# Patient Record
Sex: Female | Born: 1965 | Race: Black or African American | Hispanic: No | Marital: Single | Smoking: Never smoker
Health system: Southern US, Community
[De-identification: ages and names within clinical notes are randomized; demographics above are authoritative.]

## PROBLEM LIST (undated history)

## (undated) DIAGNOSIS — I10 Essential (primary) hypertension: Secondary | ICD-10-CM

## (undated) DIAGNOSIS — E785 Hyperlipidemia, unspecified: Secondary | ICD-10-CM

## (undated) DIAGNOSIS — R072 Precordial pain: Secondary | ICD-10-CM

## (undated) DIAGNOSIS — I2510 Atherosclerotic heart disease of native coronary artery without angina pectoris: Secondary | ICD-10-CM

## (undated) DIAGNOSIS — G4730 Sleep apnea, unspecified: Secondary | ICD-10-CM

## (undated) DIAGNOSIS — K219 Gastro-esophageal reflux disease without esophagitis: Secondary | ICD-10-CM

## (undated) DIAGNOSIS — I639 Cerebral infarction, unspecified: Secondary | ICD-10-CM

## (undated) HISTORY — DX: Cerebral infarction, unspecified: I63.9

## (undated) HISTORY — PX: OTHER SURGICAL HISTORY: SHX169

## (undated) HISTORY — DX: Gastro-esophageal reflux disease without esophagitis: K21.9

## (undated) HISTORY — DX: Sleep apnea, unspecified: G47.30

## (undated) HISTORY — DX: Precordial pain: R07.2

## (undated) HISTORY — PX: CARDIAC CATHETERIZATION: SHX172

---

## 1998-06-21 ENCOUNTER — Emergency Department (HOSPITAL_COMMUNITY): Admission: EM | Admit: 1998-06-21 | Discharge: 1998-06-21 | Payer: Self-pay | Admitting: Emergency Medicine

## 1998-06-21 ENCOUNTER — Ambulatory Visit (HOSPITAL_COMMUNITY): Admission: RE | Admit: 1998-06-21 | Discharge: 1998-06-21 | Payer: Self-pay

## 1999-09-18 ENCOUNTER — Ambulatory Visit (HOSPITAL_COMMUNITY): Admission: RE | Admit: 1999-09-18 | Discharge: 1999-09-18 | Payer: Self-pay | Admitting: *Deleted

## 1999-09-18 ENCOUNTER — Encounter: Payer: Self-pay | Admitting: *Deleted

## 2000-03-08 ENCOUNTER — Ambulatory Visit (HOSPITAL_COMMUNITY): Admission: RE | Admit: 2000-03-08 | Discharge: 2000-03-08 | Payer: Self-pay

## 2000-03-15 ENCOUNTER — Encounter: Admission: RE | Admit: 2000-03-15 | Discharge: 2000-03-15 | Payer: Self-pay | Admitting: Family Medicine

## 2000-03-15 ENCOUNTER — Encounter: Payer: Self-pay | Admitting: Family Medicine

## 2000-06-16 ENCOUNTER — Emergency Department (HOSPITAL_COMMUNITY): Admission: EM | Admit: 2000-06-16 | Discharge: 2000-06-16 | Payer: Self-pay | Admitting: Emergency Medicine

## 2000-09-15 ENCOUNTER — Encounter: Payer: Self-pay | Admitting: Family Medicine

## 2000-09-15 ENCOUNTER — Encounter: Admission: RE | Admit: 2000-09-15 | Discharge: 2000-09-15 | Payer: Self-pay | Admitting: Family Medicine

## 2001-08-26 ENCOUNTER — Emergency Department (HOSPITAL_COMMUNITY): Admission: EM | Admit: 2001-08-26 | Discharge: 2001-08-26 | Payer: Self-pay | Admitting: Emergency Medicine

## 2001-09-06 ENCOUNTER — Encounter: Payer: Self-pay | Admitting: Emergency Medicine

## 2001-09-06 ENCOUNTER — Emergency Department (HOSPITAL_COMMUNITY): Admission: EM | Admit: 2001-09-06 | Discharge: 2001-09-07 | Payer: Self-pay | Admitting: Emergency Medicine

## 2004-10-27 ENCOUNTER — Emergency Department (HOSPITAL_COMMUNITY): Admission: EM | Admit: 2004-10-27 | Discharge: 2004-10-27 | Payer: Self-pay | Admitting: Emergency Medicine

## 2006-01-18 ENCOUNTER — Emergency Department (HOSPITAL_COMMUNITY): Admission: EM | Admit: 2006-01-18 | Discharge: 2006-01-18 | Payer: Self-pay | Admitting: Family Medicine

## 2006-06-20 ENCOUNTER — Inpatient Hospital Stay (HOSPITAL_COMMUNITY): Admission: EM | Admit: 2006-06-20 | Discharge: 2006-06-21 | Payer: Self-pay | Admitting: Emergency Medicine

## 2006-06-20 ENCOUNTER — Ambulatory Visit: Payer: Self-pay | Admitting: Family Medicine

## 2006-06-20 ENCOUNTER — Encounter (INDEPENDENT_AMBULATORY_CARE_PROVIDER_SITE_OTHER): Payer: Self-pay | Admitting: Interventional Cardiology

## 2006-07-14 ENCOUNTER — Emergency Department (HOSPITAL_COMMUNITY): Admission: EM | Admit: 2006-07-14 | Discharge: 2006-07-14 | Payer: Self-pay | Admitting: Emergency Medicine

## 2006-07-15 ENCOUNTER — Ambulatory Visit: Payer: Self-pay | Admitting: Family Medicine

## 2006-07-22 ENCOUNTER — Ambulatory Visit (HOSPITAL_COMMUNITY): Admission: RE | Admit: 2006-07-22 | Discharge: 2006-07-22 | Payer: Self-pay | Admitting: Family Medicine

## 2006-07-26 ENCOUNTER — Ambulatory Visit: Payer: Self-pay | Admitting: Family Medicine

## 2006-07-29 ENCOUNTER — Ambulatory Visit: Payer: Self-pay | Admitting: Family Medicine

## 2006-08-12 ENCOUNTER — Ambulatory Visit: Payer: Self-pay | Admitting: Internal Medicine

## 2006-08-16 ENCOUNTER — Ambulatory Visit: Payer: Self-pay | Admitting: Internal Medicine

## 2006-08-17 ENCOUNTER — Ambulatory Visit (HOSPITAL_COMMUNITY): Admission: RE | Admit: 2006-08-17 | Discharge: 2006-08-17 | Payer: Self-pay | Admitting: Internal Medicine

## 2006-08-18 ENCOUNTER — Ambulatory Visit: Payer: Self-pay | Admitting: Family Medicine

## 2006-09-06 ENCOUNTER — Ambulatory Visit: Payer: Self-pay | Admitting: Family Medicine

## 2006-09-29 ENCOUNTER — Ambulatory Visit: Payer: Self-pay | Admitting: Family Medicine

## 2007-01-05 ENCOUNTER — Ambulatory Visit: Payer: Self-pay | Admitting: Family Medicine

## 2007-01-06 ENCOUNTER — Ambulatory Visit: Payer: Self-pay | Admitting: *Deleted

## 2007-02-13 ENCOUNTER — Ambulatory Visit: Payer: Self-pay | Admitting: Family Medicine

## 2007-03-06 ENCOUNTER — Ambulatory Visit: Payer: Self-pay | Admitting: Family Medicine

## 2007-03-14 ENCOUNTER — Encounter (INDEPENDENT_AMBULATORY_CARE_PROVIDER_SITE_OTHER): Payer: Self-pay | Admitting: Cardiovascular Disease

## 2007-03-14 ENCOUNTER — Ambulatory Visit (HOSPITAL_COMMUNITY): Admission: RE | Admit: 2007-03-14 | Discharge: 2007-03-14 | Payer: Self-pay | Admitting: *Deleted

## 2007-04-10 ENCOUNTER — Ambulatory Visit: Payer: Self-pay | Admitting: Family Medicine

## 2007-06-27 ENCOUNTER — Ambulatory Visit: Payer: Self-pay | Admitting: Family Medicine

## 2007-06-27 ENCOUNTER — Encounter (INDEPENDENT_AMBULATORY_CARE_PROVIDER_SITE_OTHER): Payer: Self-pay | Admitting: Family Medicine

## 2007-06-27 LAB — CONVERTED CEMR LAB
AST: 12 units/L (ref 0–37)
Albumin: 3.8 g/dL (ref 3.5–5.2)
Alkaline Phosphatase: 59 units/L (ref 39–117)
CO2: 25 meq/L (ref 19–32)
Calcium: 9.1 mg/dL (ref 8.4–10.5)
Chlamydia, DNA Probe: NEGATIVE
Chloride: 101 meq/L (ref 96–112)
Cholesterol: 203 mg/dL — ABNORMAL HIGH (ref 0–200)
GC Probe Amp, Genital: NEGATIVE
Glucose, Bld: 177 mg/dL — ABNORMAL HIGH (ref 70–99)
HDL: 37 mg/dL — ABNORMAL LOW (ref >39)
Potassium: 4.2 meq/L (ref 3.5–5.3)
Total CHOL/HDL Ratio: 5.5
Total Protein: 7.6 g/dL (ref 6.0–8.3)

## 2007-08-16 ENCOUNTER — Encounter (INDEPENDENT_AMBULATORY_CARE_PROVIDER_SITE_OTHER): Payer: Self-pay | Admitting: *Deleted

## 2008-05-08 ENCOUNTER — Ambulatory Visit: Payer: Self-pay | Admitting: Family Medicine

## 2008-05-08 DIAGNOSIS — I10 Essential (primary) hypertension: Secondary | ICD-10-CM

## 2008-05-08 DIAGNOSIS — E785 Hyperlipidemia, unspecified: Secondary | ICD-10-CM

## 2008-05-08 DIAGNOSIS — E118 Type 2 diabetes mellitus with unspecified complications: Secondary | ICD-10-CM | POA: Insufficient documentation

## 2008-05-08 DIAGNOSIS — Z794 Long term (current) use of insulin: Secondary | ICD-10-CM

## 2008-05-08 LAB — CONVERTED CEMR LAB: Hgb A1c MFr Bld: 12.1 %

## 2008-05-15 ENCOUNTER — Encounter (INDEPENDENT_AMBULATORY_CARE_PROVIDER_SITE_OTHER): Payer: Self-pay | Admitting: Family Medicine

## 2008-05-15 ENCOUNTER — Ambulatory Visit (HOSPITAL_COMMUNITY): Admission: RE | Admit: 2008-05-15 | Discharge: 2008-05-15 | Payer: Self-pay | Admitting: Family Medicine

## 2008-05-15 LAB — CONVERTED CEMR LAB
AST: 11 units/L (ref 0–37)
Albumin: 3.9 g/dL (ref 3.5–5.2)
BUN: 10 mg/dL (ref 6–23)
Basophils Relative: 0 % (ref 0–1)
Calcium: 9.5 mg/dL (ref 8.4–10.5)
Chloride: 96 meq/L (ref 96–112)
Creatinine, Ser: 0.69 mg/dL (ref 0.40–1.20)
Eosinophils Absolute: 0.1 10*3/uL (ref 0.0–0.7)
Eosinophils Relative: 1 % (ref 0–5)
Lymphs Abs: 3.7 10*3/uL (ref 0.7–4.0)
MCV: 96.7 fL (ref 78.0–100.0)
Microalb, Ur: 5.64 mg/dL — ABNORMAL HIGH (ref 0.00–1.89)
Neutro Abs: 6.3 10*3/uL (ref 1.7–7.7)
Potassium: 4.4 meq/L (ref 3.5–5.3)
RBC: 4.49 M/uL (ref 3.87–5.11)
Sodium: 135 meq/L (ref 135–145)
Total Protein: 7.6 g/dL (ref 6.0–8.3)

## 2008-05-20 ENCOUNTER — Ambulatory Visit: Payer: Self-pay | Admitting: Family Medicine

## 2008-05-21 ENCOUNTER — Encounter (INDEPENDENT_AMBULATORY_CARE_PROVIDER_SITE_OTHER): Payer: Self-pay | Admitting: Family Medicine

## 2008-05-29 LAB — CONVERTED CEMR LAB
Collection Interval-CRCL: 24 hr
Creatinine Clearance: 200 mL/min — ABNORMAL HIGH (ref 75–115)
Creatinine, Urine: 92 mg/dL

## 2008-07-08 ENCOUNTER — Ambulatory Visit: Payer: Self-pay | Admitting: Family Medicine

## 2008-07-08 ENCOUNTER — Encounter (INDEPENDENT_AMBULATORY_CARE_PROVIDER_SITE_OTHER): Payer: Self-pay | Admitting: Family Medicine

## 2008-07-08 LAB — CONVERTED CEMR LAB
Blood Glucose, Fingerstick: 169
HDL: 38 mg/dL — ABNORMAL LOW (ref >39)
LDL Cholesterol: 107 mg/dL — ABNORMAL HIGH (ref 0–99)
Total CHOL/HDL Ratio: 4.6
Triglycerides: 148 mg/dL (ref <150)
VLDL: 30 mg/dL (ref 0–40)

## 2008-08-01 ENCOUNTER — Encounter (INDEPENDENT_AMBULATORY_CARE_PROVIDER_SITE_OTHER): Payer: Self-pay | Admitting: Family Medicine

## 2008-10-01 ENCOUNTER — Ambulatory Visit: Payer: Self-pay | Admitting: Family Medicine

## 2008-12-10 ENCOUNTER — Ambulatory Visit: Payer: Self-pay | Admitting: Family Medicine

## 2008-12-10 LAB — CONVERTED CEMR LAB: Hgb A1c MFr Bld: 9.2 %

## 2008-12-24 ENCOUNTER — Ambulatory Visit: Payer: Self-pay | Admitting: Family Medicine

## 2009-01-12 ENCOUNTER — Encounter (INDEPENDENT_AMBULATORY_CARE_PROVIDER_SITE_OTHER): Payer: Self-pay | Admitting: Family Medicine

## 2009-03-04 ENCOUNTER — Ambulatory Visit: Payer: Self-pay | Admitting: Family Medicine

## 2009-03-04 LAB — CONVERTED CEMR LAB
Blood Glucose, Fingerstick: 299
Hgb A1c MFr Bld: 9.5 %

## 2009-03-06 ENCOUNTER — Ambulatory Visit: Payer: Self-pay | Admitting: Family Medicine

## 2009-03-12 ENCOUNTER — Telehealth (INDEPENDENT_AMBULATORY_CARE_PROVIDER_SITE_OTHER): Payer: Self-pay | Admitting: *Deleted

## 2009-04-08 ENCOUNTER — Ambulatory Visit: Payer: Self-pay | Admitting: Family Medicine

## 2009-04-08 LAB — CONVERTED CEMR LAB
AST: 13 units/L (ref 0–37)
BUN: 18 mg/dL (ref 6–23)
Cholesterol: 157 mg/dL (ref 0–200)
LDL Cholesterol: 101 mg/dL — ABNORMAL HIGH (ref 0–99)
Sodium: 136 meq/L (ref 135–145)
Triglycerides: 90 mg/dL (ref <150)
VLDL: 18 mg/dL (ref 0–40)

## 2009-04-26 ENCOUNTER — Encounter (INDEPENDENT_AMBULATORY_CARE_PROVIDER_SITE_OTHER): Payer: Self-pay | Admitting: Family Medicine

## 2009-04-28 ENCOUNTER — Encounter (INDEPENDENT_AMBULATORY_CARE_PROVIDER_SITE_OTHER): Payer: Self-pay | Admitting: Family Medicine

## 2009-10-22 ENCOUNTER — Ambulatory Visit: Payer: Self-pay | Admitting: Nurse Practitioner

## 2009-10-22 LAB — CONVERTED CEMR LAB: LDL Goal: 100 mg/dL

## 2010-06-29 ENCOUNTER — Emergency Department (HOSPITAL_COMMUNITY): Admission: EM | Admit: 2010-06-29 | Discharge: 2010-06-29 | Payer: Self-pay | Admitting: Emergency Medicine

## 2010-07-01 ENCOUNTER — Ambulatory Visit: Payer: Self-pay | Admitting: Nurse Practitioner

## 2010-07-01 DIAGNOSIS — E669 Obesity, unspecified: Secondary | ICD-10-CM

## 2010-07-01 LAB — CONVERTED CEMR LAB
Albumin: 3.7 g/dL (ref 3.5–5.2)
Alkaline Phosphatase: 76 units/L (ref 39–117)
BUN: 10 mg/dL (ref 6–23)
Basophils Absolute: 0 10*3/uL (ref 0.0–0.1)
Bilirubin Urine: NEGATIVE
Blood Glucose, Fingerstick: 269
CO2: 23 meq/L (ref 19–32)
Calcium: 9 mg/dL (ref 8.4–10.5)
Chloride: 101 meq/L (ref 96–112)
Creatinine, Ser: 0.62 mg/dL (ref 0.40–1.20)
Eosinophils Relative: 1 % (ref 0–5)
Glucose, Bld: 282 mg/dL — ABNORMAL HIGH (ref 70–99)
Glucose, Urine, Semiquant: >=1000
HDL: 40 mg/dL (ref >39)
Hemoglobin: 13.1 g/dL (ref 12.0–15.0)
Hgb A1c MFr Bld: 12.3 %
Ketones, urine, test strip: NEGATIVE
Lymphocytes Relative: 42 % (ref 12–46)
MCV: 95.4 fL (ref 78.0–100.0)
Monocytes Relative: 7 % (ref 3–12)
Neutro Abs: 3.5 10*3/uL (ref 1.7–7.7)
Nitrite: NEGATIVE
RBC: 4.31 M/uL (ref 3.87–5.11)
RDW: 13.8 % (ref 11.5–15.5)
Rapid HIV Screen: NEGATIVE
Sodium: 138 meq/L (ref 135–145)
Specific Gravity, Urine: >=1.03
TSH: 1.734 microintl units/mL (ref 0.350–4.500)
Total Bilirubin: 0.3 mg/dL (ref 0.3–1.2)
Total Protein: 7.2 g/dL (ref 6.0–8.3)
Triglycerides: 115 mg/dL (ref <150)
WBC Urine, dipstick: NEGATIVE
WBC: 7 10*3/uL (ref 4.0–10.5)
pH: 5.5

## 2010-07-02 ENCOUNTER — Encounter (INDEPENDENT_AMBULATORY_CARE_PROVIDER_SITE_OTHER): Payer: Self-pay | Admitting: Nurse Practitioner

## 2010-07-06 ENCOUNTER — Telehealth (INDEPENDENT_AMBULATORY_CARE_PROVIDER_SITE_OTHER): Payer: Self-pay | Admitting: Nurse Practitioner

## 2010-07-31 ENCOUNTER — Ambulatory Visit: Payer: Self-pay | Admitting: Nurse Practitioner

## 2010-07-31 LAB — CONVERTED CEMR LAB
Bilirubin Urine: NEGATIVE
Blood Glucose, Fingerstick: 291
Blood in Urine, dipstick: NEGATIVE
Glucose, Urine, Semiquant: >=1000
Ketones, urine, test strip: NEGATIVE
Nitrite: NEGATIVE
Protein, U semiquant: NEGATIVE
Urobilinogen, UA: 0.2
WBC Urine, dipstick: NEGATIVE

## 2010-09-11 ENCOUNTER — Ambulatory Visit: Payer: Self-pay | Admitting: Nurse Practitioner

## 2010-09-11 LAB — CONVERTED CEMR LAB
Bilirubin Urine: NEGATIVE
Blood Glucose, Fingerstick: 391
Blood in Urine, dipstick: NEGATIVE
Chlamydia, DNA Probe: NEGATIVE
Glucose, Urine, Semiquant: >=1000
Ketones, urine, test strip: NEGATIVE
Urobilinogen, UA: 0.2
WBC Urine, dipstick: NEGATIVE

## 2010-09-14 ENCOUNTER — Encounter (INDEPENDENT_AMBULATORY_CARE_PROVIDER_SITE_OTHER): Payer: Self-pay | Admitting: Nurse Practitioner

## 2010-09-21 ENCOUNTER — Telehealth (INDEPENDENT_AMBULATORY_CARE_PROVIDER_SITE_OTHER): Payer: Self-pay | Admitting: Nurse Practitioner

## 2010-09-23 ENCOUNTER — Ambulatory Visit (HOSPITAL_COMMUNITY): Admission: RE | Admit: 2010-09-23 | Discharge: 2010-09-23 | Payer: Self-pay | Admitting: Internal Medicine

## 2010-11-11 ENCOUNTER — Ambulatory Visit: Payer: Self-pay | Admitting: Nurse Practitioner

## 2010-11-23 ENCOUNTER — Emergency Department (HOSPITAL_COMMUNITY)
Admission: EM | Admit: 2010-11-23 | Discharge: 2010-11-24 | Payer: Self-pay | Source: Home / Self Care | Attending: Internal Medicine | Admitting: Internal Medicine

## 2010-12-11 ENCOUNTER — Ambulatory Visit
Admission: RE | Admit: 2010-12-11 | Discharge: 2010-12-11 | Payer: Self-pay | Source: Home / Self Care | Attending: Nurse Practitioner | Admitting: Nurse Practitioner

## 2010-12-11 ENCOUNTER — Encounter (INDEPENDENT_AMBULATORY_CARE_PROVIDER_SITE_OTHER): Payer: Self-pay | Admitting: Nurse Practitioner

## 2010-12-11 LAB — CONVERTED CEMR LAB
Bilirubin Urine: NEGATIVE
Blood Glucose, Fingerstick: 285
Protein, U semiquant: NEGATIVE
Specific Gravity, Urine: >=1.03
Urobilinogen, UA: 0.2
WBC Urine, dipstick: NEGATIVE
pH: 5

## 2010-12-31 NOTE — Assessment & Plan Note (Signed)
Summary: Diabetes/HTN   Vital Signs:  Patient profile:   45 year old female Menstrual status:  regular LMP:     05/2010 Weight:      262.2 pounds BMI:     46.25 Temp:     97.7 degrees F oral Pulse rate:   70 / minute Pulse rhythm:   regular Resp:     16 per minute BP sitting:   160 / 97  (left arm) Cuff size:   large  Vitals Entered By: Levon Hedger (July 01, 2010 9:21 AM)  Nutrition Counseling: Patient's BMI is greater than 25 and therefore counseled on weight management options. CC: went to hospital day before yesterday not feeling well and vision blurriness and was told to come see pcp for her diabetes, Hypertension Management, Lipid Management Is Patient Diabetic? Yes Pain Assessment Patient in pain? no      CBG Result 269 CBG Device ID A  Does patient need assistance? Functional Status Self care Ambulation Normal Comments pt is out of all medication and has been out since November 2010. LMP (date): 05/2010 LMP - Character: normal Menarche (age onset years): 11    Menstrual flow (days): 4 Enter LMP: 05/2010 Last PAP Result normal   CC:  went to hospital day before yesterday not feeling well and vision blurriness and was told to come see pcp for her diabetes, Hypertension Management, and Lipid Management.  History of Present Illness:  Pt the office for f/u after several months of not having her medications. ER vist on 06/29/2010 for blurred vision and "chest pain" Blood sugar was elevated and pt was restarted on metformin 500mg  by mouth two times a day.  Pt has NOT gotten the prescription yet, states she has intentions on picking it up today. workup negative and pt was advised by the ER go F/u with PCP She was doing well on her previous regimen of medications her eligibility expired and she was not able to get the medications.  Diabetes Management History:      The patient is a 45 years old female who comes in for evaluation of Type 2 Diabetes Mellitus.  She  has not been enrolled in the "Diabetic Education Program".  She states understanding of dietary principles but she is not following the appropriate diet.  No sensory loss is reported.  Self foot exams are not being performed.  She is not checking home blood sugars.  She says that she is not exercising regularly.        Hyperglycemic symptoms include polyuria, polydipsia, and blurred vision.  Other comments include: Pt has not been taking meds.        The following changes have been made to her treatment plan since last visit: medication changes.  Treatment plan changes were initiated by patient.    Hypertension History:      She complains of chest pain, but denies headache and palpitations.  pt has not taken any medications since 09/2009.        Positive major cardiovascular risk factors include diabetes, hyperlipidemia, hypertension, and family history for ischemic heart disease (males less than 52 years old).  Negative major cardiovascular risk factors include female age less than 36 years old and non-tobacco-user status.        Further assessment for target organ damage reveals no history of ASHD, stroke/TIA, or peripheral vascular disease.    Lipid Management History:      Positive NCEP/ATP III risk factors include diabetes, HDL cholesterol less than  40, family history for ischemic heart disease (males less than 75 years old), and hypertension.  Negative NCEP/ATP III risk factors include female age less than 76 years old, no history of early menopause without estrogen hormone replacement, non-tobacco-user status, no ASHD (atherosclerotic heart disease), no prior stroke/TIA, no peripheral vascular disease, and no history of aortic aneurysm.        The patient states that she knows about the "Therapeutic Lifestyle Change" diet.  Her compliance with the TLC diet is poor.  The patient expresses understanding of adjunctive measures for cholesterol lowering.  Comments include: pt has not taken her  medications since 09/2009.       Habits & Providers  Exercise-Depression-Behavior     Does Patient Exercise: no  Allergies (verified): No Known Drug Allergies  Social History: Does Patient Exercise:  no  Review of Systems General:  Denies fever. Eyes:  Complains of blurring. CV:  Complains of swelling of feet; denies chest pain or discomfort and fatigue. Resp:  Denies cough. GI:  Denies abdominal pain, nausea, and vomiting. Neuro:  Complains of tingling; in feet.  Physical Exam  General:  alert.  obese Head:  normocephalic.   Lungs:  normal breath sounds.   Heart:  normal rate and regular rhythm.   Msk:  up to the exam table Neurologic:  alert & oriented X3.   Skin:  color normal.   Psych:  Oriented X3.    Diabetes Management Exam:    Foot Exam (with socks and/or shoes not present):       Sensory-Monofilament:          Left foot: normal          Right foot: normal   Impression & Recommendations:  Problem # 1:  DIABETES MELLITUS, TYPE II (ICD-250.00) pt has been without her meds since 09/2009 reviewed hgba1c with pt today will restart on all meds. actos samples given pt to check blood sugar at least twice per day - refill given on strips Her updated medication list for this problem includes:    Actos 30 Mg Tabs (Pioglitazone hcl) .Marland Kitchen... Take one tablet by mouth every morning    Januvia 100 Mg Tabs (Sitagliptin phosphate) .Marland Kitchen... Take one tablet by mouth every morning    Levemir Flexpen 100 Unit/ml Soln (Insulin detemir) ..... Inject 75  units under skin each day   gene icp levemir flexpen 07/08    Lisinopril-hydrochlorothiazide 20-12.5 Mg Tabs (Lisinopril-hydrochlorothiazide) .Marland Kitchen... Take 2 tablets by mouth  every morning    Novolog Flexpen 100 Unit/ml Soln (Insulin aspart) .Marland Kitchen... Check bs 15 minutes before each meal if 0-70 then eat first,71-120 then 10 units,121-150 then 15 units,151-200 then 18 units,201-250 then 20 units,251 or higher then 25.  Orders: Capillary  Blood Glucose/CBG (87564) UA Dipstick w/o Micro (manual) (33295) T-Urine Microalbumin w/creat. ratio [phone]) T-TSH [phone]) Hemoglobin A1C (22025)  Problem # 2:  HYPERTENSION (ICD-401.9) BP elevated today but pt has been without meds will refill meds reviewd DASH diet Her updated medication list for this problem includes:    Lisinopril-hydrochlorothiazide 20-12.5 Mg Tabs (Lisinopril-hydrochlorothiazide) .Marland Kitchen... Take 2 tablets by mouth  every morning  Orders: T-TSH (42706-23762)  Problem # 3:  HYPERLIPIDEMIA (ICD-272.4) will check labs today pt has been without meds so will likely be elevated Her updated medication list for this problem includes:    Crestor 5 Mg Tabs (Rosuvastatin calcium) .Marland Kitchen... Take one (1) tablet at bedtime  Orders: T-Lipid Profile (83151-76160) T-Comprehensive Metabolic Panel [phone]) T-CBC w/Diff (85462-70350) Rapid HIV  (  [address])  Problem # 4:  OBESITY (ICD-278.00) some weight decrease since last visit but that may be due to uncontrolled diabetes advised pt that she does need to increase activity  Complete Medication List: 1)  Actos 30 Mg Tabs (Pioglitazone hcl) .... Take one tablet by mouth every morning 2)  Crestor 5 Mg Tabs (Rosuvastatin calcium) .... Take one (1) tablet at bedtime 3)  Januvia 100 Mg Tabs (Sitagliptin phosphate) .... Take one tablet by mouth every morning 4)  Levemir Flexpen 100 Unit/ml Soln (Insulin detemir) .... Inject 75  units under skin each day   gene icp levemir flexpen 07/08 5)  Protonix 40 Mg Tbec (Pantoprazole sodium) .... Take one (1) tablet by mouth every day 6)  Glucometer Strips  .... 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 Take 2 tablets by mouth  every morning 8)  Novolog Flexpen 100 Unit/ml Soln (Insulin aspart) .... Check bs 15 minutes before each meal if 0-70 then eat first,71-120 then 10 units,121-150 then 15  units,151-200 then 18 units,201-250 then 20 units,251 or higher then 25. 9)  Lancets Misc (Lancets) .... Check blood sugar three times a day  Diabetes Management Assessment/Plan:      The following lipid goals have been established for the patient: Total cholesterol goal of 200; LDL cholesterol goal of 100; HDL cholesterol goal of 40; Triglyceride goal of 150.  Her blood pressure goal is < 125/75.    Hypertension Assessment/Plan:      The patient's hypertensive risk group is category C: Target organ damage and/or diabetes.  Her calculated 10 year risk of coronary heart disease is 17 %.  Today's blood pressure is 160/97.  Her blood pressure goal is < 125/75.  Lipid Assessment/Plan:      Based on NCEP/ATP III, the patient's risk factor category is "history of diabetes".  The patient's lipid goals are as follows: Total cholesterol goal is 200; LDL cholesterol goal is 100; HDL cholesterol goal is 40; Triglyceride goal is 150.    Patient Instructions: 1)  Your blood pressure and blood sugar are very high.  LIkely because you have been without your medications.  Refills on all your medications have been sent to the pharmacy.   2)  You will gradually restart on all medications. 3)  Start today on Actos 30mg  and Crestor. 4)  Restart on all other medications when you get then next week. 5)  Follow up in 1 month with n.martin,fnp for diabetes 6)  Bring blood sugar log into the office. 7)  Will need u/a. 8)  Will need to schedule CPE Prescriptions: LANCETS  MISC (LANCETS) Check blood sugar three times a day  #100 x 11   Entered and Authorized by:   Lehman Prom FNP   Signed by:   Lehman Prom FNP on 07/01/2010   Method used:   Faxed to ...       [facility] - [facility] (retail)       [address] [place], [place]  [address]       Ph: [phone]       Fax: [phone]   RxID:   [id number] GLUCOMETER STRIPS Check BS two times a day and record  Dx=IDDM  #100 x 11   Entered and Authorized by:   Lehman Prom FNP   Signed by:   Lehman Prom FNP on 07/01/2010   Method used:   Arneta Cliche  to ...       [facility] - [facility] (retail)       [address] [place], [place]  [address]       Ph: [phone]       Fax: [phone]   RxID:   [id number] PROTONIX 40 MG TBEC (PANTOPRAZOLE SODIUM) TAKE ONE (1) TABLET BY MOUTH EVERY DAY  #30 x 2   Entered and Authorized by:   Lehman Prom FNP   Signed by:   Lehman Prom FNP on 07/01/2010   Method used:   Faxed to ...       [facility] - [facility] (retail)       [address] [place], [place]  [address]       Ph: [phone]       Fax: [phone]   RxID:   [id number] LISINOPRIL-HYDROCHLOROTHIAZIDE 20-12.5 MG TABS (LISINOPRIL-HYDROCHLOROTHIAZIDE) Take 2 tablets by mouth  every morning  #60 x 2   Entered and Authorized by:   Lehman Prom FNP   Signed by:   Lehman Prom FNP on 07/01/2010   Method used:   Faxed to ...       [facility] - Pharmac (retail)       [address] [place], [place]  [address]       Ph: [phone]       Fax: [phone]   RxID:   [id number] NOVOLOG FLEXPEN 100 UNIT/ML SOLN (INSULIN ASPART) Check BS 15 minutes before each meal if 0-70 then eat first,71-120 then 10 units,121-150 then 15 units,151-200 then 18 units,201-250 then 20 units,251 or higher then 25.  #35month x 2   Entered and Authorized by:   Lehman Prom FNP   Signed by:   Lehman Prom FNP on 07/01/2010   Method used:   Faxed to ...       [facility] - [facility] (retail)       [address] [place], [place]  [address]       Ph: [phone]       Fax: [phone]   RxID:   [id number] CRESTOR 5 MG TABS (ROSUVASTATIN CALCIUM) TAKE ONE (1) TABLET AT BEDTIME  #30 x 2   Entered and Authorized by:   Lehman Prom FNP   Signed  by:   Lehman Prom FNP on 07/01/2010   Method used:   Faxed to ...       [facility] - [facility] (retail)       [address] [place], [place]  [address]       Ph: [phone]       Fax: [phone]   RxID:   [id number] LEVEMIR FLEXPEN 100 UNIT/ML SOLN (INSULIN DETEMIR) INJECT 75  UNITS UNDER SKIN EACH DAY   GENE ICP LEVEMIR FLEXPEN 07/08  #1 month x 2   Entered and Authorized by:   Lehman Prom FNP   Signed by:   Lehman Prom FNP on 07/01/2010   Method used:   Faxed to ...       [facility] - [facility] (retail)       [address] [place], [place]  [address]       Ph: [phone]       Fax: [phone]  RxID:   [id number] JANUVIA 100 MG TABS (SITAGLIPTIN PHOSPHATE) TAKE ONE TABLET BY MOUTH EVERY MORNING  #30 x 2   Entered and Authorized by:   Lehman Prom FNP   Signed by:   Lehman Prom FNP on 07/01/2010   Method used:   Faxed to ...       [facility] - Pharmac (retail)       [address] [place], [place]  [address]       Ph: [phone]       Fax: [phone]   RxID:   [id number] ACTOS 30 MG TABS (PIOGLITAZONE HCL) TAKE ONE TABLET BY MOUTH EVERY MORNING  #30 x 2   Entered and Authorized by:   Lehman Prom FNP   Signed by:   Lehman Prom FNP on 07/01/2010   Method used:   Faxed to ...       [facility] - Pharmac (retail)       [address] [place], [place]  [address]       Ph: [phone]       Fax: [phone]   RxID:   [id number]   Laboratory Results   Urine Tests  Date/Time Received: July 01, 2010 9:39 AM   Routine Urinalysis   Color: lt. yellow Appearance: Clear Glucose: >=1000   (Normal Range: Negative) Bilirubin: negative   (Normal Range: Negative) Ketone: negative   (Normal Range: Negative) Spec. Gravity: >=1.030   (Normal Range: 1.003-1.035) Blood: negative    (Normal Range: Negative) pH: 5.5   (Normal Range: 5.0-8.0) Protein: negative   (Normal Range: Negative) Urobilinogen: 0.2   (Normal Range: 0-1) Nitrite: negative   (Normal Range: Negative) Leukocyte Esterace: negative   (Normal Range: Negative)     Blood Tests   Date/Time Received: July 01, 2010 9:39 AM   HGBA1C: 12.3%   (Normal Range: Non-Diabetic - 3-6%   Control Diabetic - 6-8%) CBG Random:: 269    Other Tests  Rapid HIV: negative      Last LDL:                                                 101 (04/08/2009 11:09:00 PM)        Diabetic Foot Exam Last Podiatry Exam Date: 10/22/2009    10-g (5.07) Semmes-Weinstein Monofilament Test Performed by: Levon Hedger          Right Foot          Left Foot Visual Inspection               Test Control      normal         normal Site 1         normal         normal Site 2         normal         normal Site 3         normal         normal Site 4         normal         normal Site 5         normal         normal Site 6  normal         normal Site 7         normal         normal Site 8         normal         normal Site 9         abnormal         normal Site 10         normal         normal  Impression      normal         normal   Laboratory Results   Urine Tests    Routine Urinalysis   Color: lt. yellow Appearance: Clear Glucose: >=1000   (Normal Range: Negative) Bilirubin: negative   (Normal Range: Negative) Ketone: negative   (Normal Range: Negative) Spec. Gravity: >=1.030   (Normal Range: 1.003-1.035) Blood: negative   (Normal Range: Negative) pH: 5.5   (Normal Range: 5.0-8.0) Protein: negative   (Normal Range: Negative) Urobilinogen: 0.2   (Normal Range: 0-1) Nitrite: negative   (Normal Range: Negative) Leukocyte Esterace: negative   (Normal Range: Negative)     Blood Tests     HGBA1C: 12.3%   (Normal Range: Non-Diabetic - 3-6%   Control Diabetic - 6-8%) CBG Random:: 269mg /dL     Other Tests  Rapid HIV: negative

## 2010-12-31 NOTE — Assessment & Plan Note (Signed)
Summary: Diabetes   Vital Signs:  Patient profile:   45 year old female Menstrual status:  regular Height:      63.25 inches Weight:      259 pounds BMI:     45.68 Temp:     97.2 degrees F oral Pulse rate:   88 / minute Pulse rhythm:   regular Resp:     18 per minute BP sitting:   130 / 82  (left arm) Cuff size:   large  Vitals Entered By: [place] Shannon (July 31, 2010 8:14 AM)  Nutrition Counseling: Patient's BMI is greater than 25 and therefore counseled on weight management options. CC: one month f/u, Hypertension Management, Lipid Management Is Patient Diabetic? Yes Pain Assessment Patient in pain? no      CBG Result 291  Does patient need assistance? Functional Status Self care Ambulation Normal   CC:  one month f/u, Hypertension Management, and Lipid Management.  History of Present Illness: Pt into the office for follow on diabetes. Pt was last seen in this office 1 month..    Pt did not bring her medications into the office with her today.   She also is not checking her blood sugar.  She is supposed to be taking novolog but she is only checking her BS about 3 times per week at work.  Diabetes Management History:      The patient is a 46 years old female who comes in for evaluation of Type 2 Diabetes Mellitus.  She has not been enrolled in the "Diabetic Education Program".  She states understanding of dietary principles and is following her diet appropriately.  No sensory loss is reported.  Self foot exams are not being performed.  She is not checking home blood sugars.  She says that she is not exercising regularly.        Other comments include: Pt has not gotten her glucometer as ordered (reports that the pharmacy did not give it to her) and she could not afford to get it from [place].  She does check her blood sugar at work about 3 times per week but that is random sticks.        No changes have been made to her treatment plan since last visit.     Hypertension History:      She denies headache, chest pain, and palpitations.  She notes no problems with any antihypertensive medication side effects.        Positive major cardiovascular risk factors include diabetes, hyperlipidemia, hypertension, and family history for ischemic heart disease (males less than 77 years old).  Negative major cardiovascular risk factors include female age less than 28 years old and non-tobacco-user status.        Further assessment for target organ damage reveals no history of ASHD, stroke/TIA, peripheral vascular disease, renal insufficiency, or hypertensive retinopathy.    Lipid Management History:      Positive NCEP/ATP III risk factors include diabetes, family history for ischemic heart disease (males less than 9 years old), and hypertension.  Negative NCEP/ATP III risk factors include female age less than 1 years old, no history of early menopause without estrogen hormone replacement, non-tobacco-user status, no ASHD (atherosclerotic heart disease), no prior stroke/TIA, no peripheral vascular disease, and no history of aortic aneurysm.        The patient states that she knows about the "Therapeutic Lifestyle Change" diet.  Her compliance with the TLC diet is poor.  Adjunctive measures started by the  patient include weight reduction.  She expresses no side effects from her lipid-lowering medication.  The patient denies any symptoms to suggest myopathy or liver disease.      Current Medications (verified): 1)  Actos 30 Mg Tabs (Pioglitazone Hcl) .... Take One Tablet By Mouth Every Morning 2)  Crestor 5 Mg Tabs (Rosuvastatin Calcium) .... Take One (1) Tablet At Bedtime 3)  Januvia 100 Mg Tabs (Sitagliptin Phosphate) .... Take One Tablet By Mouth Every Morning 4)  Levemir Flexpen 100 Unit/ml Soln (Insulin Detemir) .... Inject 75  Units Under Skin Each Day   Gene Icp Levemir Flexpen 07/08 5)  Protonix 40 Mg Tbec (Pantoprazole Sodium) .... Take One (1) Tablet By  Mouth Every Day 6)  Glucometer Strips .... 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 Take 2 Tablets By Mouth  Every Morning 8)  Novolog Flexpen 100 Unit/ml Soln (Insulin Aspart) .... Check Bs 15 Minutes Before Each Meal If 0-70 Then Eat First,71-120 Then 10 Units,121-150 Then 15 Units,151-200 Then 18 Units,201-250 Then 20 Units,251 or Higher Then 25. 9)  Lancets  Misc (Lancets) .... Check Blood Sugar Three Times A Day  Allergies (verified): No Known Drug Allergies  Review of Systems CV:  Denies chest pain or discomfort. Resp:  Denies cough. GI:  Denies abdominal pain, nausea, and vomiting. Endo:  Complains of excessive thirst and excessive urination.  Physical Exam  General:  alert.   Head:  normocephalic.   Lungs:  normal breath sounds.   Heart:  normal rate and regular rhythm.   Abdomen:  normal bowel sounds.   Msk:  normal ROM.   Neurologic:  alert & oriented X3.     Impression & Recommendations:  Problem # 1:  DIABETES MELLITUS, TYPE II (ICD-250.00)  pt does not have a glucometer called the pharmacy and she has already received a glucometer in 2009 and will need to purchase her own will change novolog to 10 units three times a day pt has NOT received [place] yet. she will check with the pharmacy on next week. Her updated medication list for this problem includes:    Actos 30 Mg Tabs (Pioglitazone hcl) .Marland Kitchen... Take one tablet by mouth every morning    Januvia 100 Mg Tabs (Sitagliptin phosphate) .Marland Kitchen... Take one tablet by mouth every morning    Levemir Flexpen 100 Unit/ml Soln (Insulin detemir) ..... Inject 75  units under skin each day   gene icp levemir flexpen 07/08    Lisinopril-hydrochlorothiazide 20-12.5 Mg Tabs (Lisinopril-hydrochlorothiazide) .Marland Kitchen... Take 2 tablets by mouth  every morning    Novolog Flexpen 100 Unit/ml Soln (Insulin aspart) .Marland KitchenMarland KitchenMarland KitchenMarland Kitchen 10 units three times a day  subcutaneously  before meals  Orders: Capillary Blood Glucose/CBG ([address]) UA Dipstick w/o Micro (manual) ([address])  Problem # 2:  HYPERTENSION (ICD-401.9) stable advised DASH diet Her updated medication list for this problem includes:    Lisinopril-hydrochlorothiazide 20-12.5 Mg Tabs (Lisinopril-hydrochlorothiazide) .Marland Kitchen... Take 2 tablets by mouth  every morning  Problem # 3:  HYPERLIPIDEMIA (ICD-272.4) will recheck on next visit Her updated medication list for this problem includes:    Crestor 5 Mg Tabs (Rosuvastatin calcium) .Marland Kitchen... Take one (1) tablet at bedtime  Problem # 4:  OBESITY (ICD-278.00) pt will need to modify diet in efforts to lose weight she can make an appt with susie piper  Complete Medication List: 1)  Actos 30 Mg Tabs (Pioglitazone hcl) .... Take one tablet by mouth every morning 2)  Crestor 5 Mg Tabs (Rosuvastatin calcium) .... Take one (1) tablet at bedtime 3)  Januvia 100 Mg Tabs (Sitagliptin phosphate) .... Take one tablet by mouth every morning 4)  Levemir Flexpen 100 Unit/ml Soln (Insulin detemir) .... Inject 75  units under skin each day   gene icp levemir flexpen 07/08 5)  Protonix 40 Mg Tbec (Pantoprazole sodium) .... Take one (1) tablet by mouth every day 6)  Glucometer Strips  .... 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 Take 2 tablets by mouth  every morning 8)  Novolog Flexpen 100 Unit/ml Soln (Insulin aspart) .Marland Kitchen.. 10 units three times a day subcutaneously  before meals 9)  Lancets Misc (Lancets) .... Check blood sugar three times a day  Diabetes Management Assessment/Plan:      The following lipid goals have been established for the patient: Total cholesterol goal of 200; LDL cholesterol goal of 100; HDL cholesterol goal of 40; Triglyceride goal of 150.  Her blood pressure goal is < 125/75.    Hypertension Assessment/Plan:      The patient's hypertensive risk  group is category C: Target organ damage and/or diabetes.  Her calculated 10 year risk of coronary heart disease is 11 %.  Today's blood pressure is 130/82.  Her blood pressure goal is < 125/75.  Lipid Assessment/Plan:      Based on NCEP/ATP III, the patient's risk factor category is "history of diabetes".  The patient's lipid goals are as follows: Total cholesterol goal is 200; LDL cholesterol goal is 100; HDL cholesterol goal is 40; Triglyceride goal is 150.  Her LDL cholesterol goal has been met.    Diabetic Foot Exam Last Podiatry Exam Date: 10/22/2009 Foot Inspection Is there a history of a foot ulcer?              No Is there a foot ulcer now?              No Can the patient see the bottom of their feet?          No Are the shoes appropriate in style and fit?          Yes Is there swelling or an abnormal foot shape?          No Are the toenails long?                No Are the toenails thick?                No Are the toenails ingrown?              No Is there heavy callous build-up?              No Is there pain in the calf muscle (Intermittent claudication) when walking?    NoIs there a claw toe deformity?              No Is there elevated skin temperature?            No Is there limited ankle dorsiflexion?            No Is there foot or ankle muscle weakness?            No  Diabetic Foot Care Education Pulse Check          Right Foot          Left Foot Dorsalis Pedis:  normal            normal   Patient Instructions: 1)  Schedule appointment in 4-6 weeks for a complete physical exam.  2)  Do not after midnight before this visit. 3)  You will need labs, mammogram, u/a, microalbumin 4)  Schedule an appointment with Drucilla Schmidt for diabetes teaching and glucometer. 5)  Novolog - 10 units three times a day before meals.  6)  Continue Levemir 75 units subcutaneously nightly. 7)  Get Januvia from the pharmacy and start taking for your blood sugar Prescriptions: NOVOLOG  FLEXPEN 100 UNIT/ML SOLN (INSULIN ASPART) 10 units three times a day subcutaneously  before meals  #1 month qs x 3   Entered and Authorized by:   Lehman Prom FNP   Signed by:   Lehman Prom FNP on 07/31/2010   Method used:   Faxed to ...       [facility] (retail)       [address] [place], [place]  [address]       Ph: [phone]       Fax: [phone]   RxID:   [id number]   Laboratory Results   Urine Tests  Date/Time Received: July 31, 2010 9:19 AM   Routine Urinalysis   Color: lt. yellow Glucose: >=1000   (Normal Range: Negative) Bilirubin: negative   (Normal Range: Negative) Ketone: negative   (Normal Range: Negative) Spec. Gravity: >=1.030   (Normal Range: 1.003-1.035) Blood: negative   (Normal Range: Negative) pH: 5.0   (Normal Range: 5.0-8.0) Protein: negative   (Normal Range: Negative) Urobilinogen: 0.2   (Normal Range: 0-1) Nitrite: negative   (Normal Range: Negative) Leukocyte Esterace: negative   (Normal Range: Negative)     Blood Tests     CBG Random:: 291mg /dL

## 2010-12-31 NOTE — Progress Notes (Signed)
Summary: Office VisitDEPRESSION SCREENING  Office VisitDEPRESSION SCREENING   Imported By: Arta Bruce 09/14/2010 11:03:19  _____________________________________________________________________  External Attachment:    Type:   Image     Comment:   External Document

## 2010-12-31 NOTE — Progress Notes (Signed)
Summary: Office Visit//DEPRESSION SCREENING  Office Visit//DEPRESSION SCREENING   Imported By: Arta Bruce 12/11/2010 13:45:11  _____________________________________________________________________  External Attachment:    Type:   Image     Comment:   External Document

## 2010-12-31 NOTE — Letter (Signed)
Summary: Lipid Letter  [facility]  [address] [place], [place] [address]   Phone: [phone]  Fax: [phone]    07/02/2010  Cindy [name] [address] [place], [place]  [address]  Dear Cindy [name]:  We have carefully reviewed your last lipid profile from 07/01/2010 and the results are noted below with a summary of recommendations for lipid management.    Cholesterol:       189     Goal: less than 200   HDL "good" Cholesterol:   40     Goal: greater than 40   LDL "bad" Cholesterol:   126     Goal: less than 70   Triglycerides:       115     Goal: less than 150    Labs done during your recent office visit shows that your blood sugar was high.  All your medications have been sent to the pharmacy and you have been instructed on how to restart them. Blood pressure was also high during recent visit.  This will be rechecked on your next visit. Be sure to take your blood pressure medications before this visit.     Current Medications: 1)    Actos 30 Mg Tabs (Pioglitazone hcl) .... Take one tablet by mouth every morning 2)    Crestor 5 Mg Tabs (Rosuvastatin calcium) .... Take one (1) tablet at bedtime 3)    Januvia 100 Mg Tabs (Sitagliptin phosphate) .... Take one tablet by mouth every morning 4)    Levemir Flexpen 100 Unit/ml Soln (Insulin detemir) .... Inject 75  units under skin each day   gene icp levemir flexpen 07/08 5)    Protonix 40 Mg Tbec (Pantoprazole sodium) .... Take one (1) tablet by mouth every day 6)    Glucometer Strips  .... Check bs two times a day and record dx=iddm 7)    Lisinopril-hydrochlorothiazide 20-12.5 Mg Tabs (Lisinopril-hydrochlorothiazide) .... Take 2 tablets by mouth  every morning 8)    Novolog Flexpen 100 Unit/ml Soln (Insulin aspart) .... Check bs 15 minutes before each meal if 0-70 then eat first,71-120 then 10 units,121-150 then 15 units,151-200 then 18 units,201-250 then 20 units,251 or higher then 25. 9)    Lancets  Misc (Lancets) ....  Check blood sugar three times a day  If you have any questions, please call. We appreciate being able to work with you.   Sincerely,    [facility] Lehman Prom FNP

## 2010-12-31 NOTE — Assessment & Plan Note (Signed)
Summary: Diabetes/HTN   Vital Signs:  Patient profile:   45 year old female Menstrual status:  regular LMP:     11/13/2010 Weight:      246.8 pounds BMI:     43.53 Temp:     97.8 degrees F oral Pulse rate:   72 / minute Pulse rhythm:   regular Resp:     16 per minute BP sitting:   120 / 80  (left arm) Cuff size:   large  Vitals Entered By: Levon Hedger (December 11, 2010 10:11 AM)  Nutrition Counseling: Patient's BMI is greater than 25 and therefore counseled on weight management options.  Diabetic Foot Exam Last Podiatry Exam Date: 10/22/2009 Foot Inspection Is there a history of a foot ulcer?              No Is there a foot ulcer now?              No Can the patient see the bottom of their feet?          No Are the shoes appropriate in style and fit?          No Is there swelling or an abnormal foot shape?          No Are the toenails long?                No Are the toenails thick?                No Are the toenails ingrown?              No Is there heavy callous build-up?              No Is there pain in the calf muscle (Intermittent claudication) when walking?    NoIs there a claw toe deformity?              No Is there elevated skin temperature?            No Is there limited ankle dorsiflexion?            No Is there foot or ankle muscle weakness?            No  Diabetic Foot Care Education Patient educated on appropriate care of diabetic feet.  Pulse Check          Right Foot          Left Foot Dorsalis Pedis:        normal            normal    10-g (5.07) Semmes-Weinstein Monofilament Test Performed by: Levon Hedger          Right Foot          Left Foot Visual Inspection               CC: follow-up visit, Hypertension Management, Lipid Management Is Patient Diabetic? Yes Pain Assessment Patient in pain? no      CBG Result 285 CBG Device ID A  Does patient need assistance? Functional Status Self care Ambulation Normal LMP (date):  11/13/2010 LMP - Character: normal Menarche (age onset years): 11    Menstrual flow (days): 5 Enter LMP: 11/13/2010 Last PAP Result  Specimen Adequacy: Satisfactory for evaluation.   Interpretation/Result:Negative for intraepithelial Lesion or Malignancy.   Interpretation/Result:Fungal organisms c/w Candida present.       CC:  follow-up visit, Hypertension Management, and Lipid Management.  History of Present Illness:  Pt into the office for f/u on diabetes. Pt was to change novolog to 10 units three times a day during last visit but she reports that she has still been using the sliding scale.  She has been taking levemire 75 units at night.  Pharmacy sheet reviewed and pt has it shows that pt has not refiled medications since 09/28/2010  ER visit since her last visit here for migraine headache. She reports that she was given an injection of medications and percocet for pain.  Headache then resolved.  First every severe headache of that nature  Obesity - down 4 pounds since last visit. Pt has been trying to decrease weight  Diabetes Management History:      The patient is a 45 years old female who comes in for evaluation of Type 2 Diabetes Mellitus.  She has not been enrolled in the "Diabetic Education Program".  She states understanding of dietary principles but she is not following the appropriate diet.  No sensory loss is reported.  Self foot exams are not being performed.  She is checking home blood sugars.  She says that she is exercising.  Type of exercise includes: walk.  She is doing this 3 times per week.        Other comments include: pt is checking her blood sugar at least twice per day - in the mornings before meals and at night before bedtime.  reports some elevations still in blood sugar.        The following changes have been made to her treatment plan since last visit: insulin dosing.  Treatment plan changes were initiated by MD.    Hypertension History:      She denies  headache, chest pain, and palpitations.        Positive major cardiovascular risk factors include diabetes, hyperlipidemia, hypertension, and family history for ischemic heart disease (males less than 52 years old).  Negative major cardiovascular risk factors include female age less than 52 years old and non-tobacco-user status.        Further assessment for target organ damage reveals no history of ASHD, stroke/TIA, peripheral vascular disease, renal insufficiency, or hypertensive retinopathy.    Lipid Management History:      Positive NCEP/ATP III risk factors include diabetes, family history for ischemic heart disease (males less than 76 years old), and hypertension.  Negative NCEP/ATP III risk factors include female age less than 55 years old, no history of early menopause without estrogen hormone replacement, non-tobacco-user status, no ASHD (atherosclerotic heart disease), no prior stroke/TIA, no peripheral vascular disease, and no history of aortic aneurysm.        The patient states that she knows about the "Therapeutic Lifestyle Change" diet.  Her compliance with the TLC diet is poor.  She expresses no side effects from her lipid-lowering medication.  The patient denies any symptoms to suggest myopathy or liver disease.       Habits & Providers  Alcohol-Tobacco-Diet     Alcohol drinks/day: 0     Tobacco Status: never  Exercise-Depression-Behavior     Does Patient Exercise: yes     Exercise Counseling: to improve exercise regimen     Type of exercise: walk     Times/week: 3     Depression Counseling: not indicated; screening negative for depression     Drug Use: no     Seat Belt Use: 100     Sun Exposure: occasionally  Comments: PHQ-9 = 15 In october 2011 and PHQ-9 =  10 today  Allergies (verified): No Known Drug Allergies  Review of Systems General:  Denies fever. CV:  Denies chest pain or discomfort. Resp:  Denies cough. GI:  Denies abdominal pain, nausea, and  vomiting. Endo:  Denies excessive thirst and excessive urination.  Physical Exam  General:  alert.   Head:  normocephalic.   Eyes:  glasses Lungs:  normal breath sounds.   Heart:  normal rate and regular rhythm.   Abdomen:  normal bowel sounds.   Msk:  up to the exam table Neurologic:  alert & oriented X3.    Diabetes Management Exam:    Foot Exam (with socks and/or shoes not present):       Sensory-Monofilament:          Left foot: normal          Right foot: normal   Impression & Recommendations:  Problem # 1:  DIABETES MELLITUS, TYPE II (ICD-250.00) Advised pt to take meds as ordered upon review of pharmacy profile sheet pt has not picked up meds since 08/2010.  She reports she is going to request refills on next week. Cost may have some factor but reviewed with pt that some of her meds come from pt assistance programs so they should be free of charge Her updated medication list for this problem includes:    Actos 30 Mg Tabs (Pioglitazone hcl) .Marland Kitchen... Take one tablet by mouth every morning    Januvia 100 Mg Tabs (Sitagliptin phosphate) .Marland Kitchen... Take one tablet by mouth every morning    Levemir Flexpen 100 Unit/ml Soln (Insulin detemir) ..... Inject 75  units under skin each day   gene icp levemir flexpen 07/08    Lisinopril-hydrochlorothiazide 20-12.5 Mg Tabs (Lisinopril-hydrochlorothiazide) .Marland Kitchen... Take 2 tablets by mouth  every morning    Novolog Flexpen 100 Unit/ml Soln (Insulin aspart) .Marland KitchenMarland KitchenMarland KitchenMarland Kitchen 10 units three times a day subcutaneously  before meals  Orders: Capillary Blood Glucose/CBG (91478) UA Dipstick w/o Micro (manual) (29562) T- Hemoglobin A1C (13086-57846)  Problem # 2:  HYPERTENSION (ICD-401.9) BP stable Reviewed DASH diet Her updated medication list for this problem includes:    Lisinopril-hydrochlorothiazide 20-12.5 Mg Tabs (Lisinopril-hydrochlorothiazide) .Marland Kitchen... Take 2 tablets by mouth  every morning  Problem # 3:  HYPERLIPIDEMIA (ICD-272.4) Continue current  meds Her updated medication list for this problem includes:    Crestor 5 Mg Tabs (Rosuvastatin calcium) .Marland Kitchen... Take one (1) tablet at bedtime  Problem # 4:  OBESITY (ICD-278.00) down 4 pounds since last visit pt is encouraged and reports that she had lost even more but the holidays added even more weight  Complete Medication List: 1)  Actos 30 Mg Tabs (Pioglitazone hcl) .... Take one tablet by mouth every morning 2)  Crestor 5 Mg Tabs (Rosuvastatin calcium) .... Take one (1) tablet at bedtime 3)  Januvia 100 Mg Tabs (Sitagliptin phosphate) .... Take one tablet by mouth every morning 4)  Levemir Flexpen 100 Unit/ml Soln (Insulin detemir) .... Inject 75  units under skin each day   gene icp levemir flexpen 07/08 5)  Protonix 40 Mg Tbec (Pantoprazole sodium) .... Take one (1) tablet by mouth every day 6)  Glucometer Strips  .... 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 Take 2 tablets by mouth  every morning 8)  Novolog Flexpen 100 Unit/ml Soln (Insulin aspart) .Marland Kitchen.. 10 units three times a day subcutaneously  before meals 9)  Lancets Misc (Lancets) .... Check blood sugar three times  a day 10)  Pen Needles 5/16" 31g X 8 Mm Misc (Insulin pen needle) .... Use with inuslin four times per day  Diabetes Management Assessment/Plan:      The following lipid goals have been established for the patient: Total cholesterol goal of 200; LDL cholesterol goal of 100; HDL cholesterol goal of 40; Triglyceride goal of 150.  Her blood pressure goal is < 125/75.    Current Insulin Use:    Short Acting Insulin:       Type:  Novolog        Dosage:  10 units at breakfast; 10 units at lunch; 10 units at dinner;     Longer Acting Insulin:       Type:  Levemir       Dosage:  75 units units at bedtime.  Hypertension Assessment/Plan:      The patient's hypertensive risk group is category C: Target organ damage and/or diabetes.   Her calculated 10 year risk of coronary heart disease is 11 %.  Today's blood pressure is 120/80.  Her blood pressure goal is < 125/75.  Lipid Assessment/Plan:      Based on NCEP/ATP III, the patient's risk factor category is "history of diabetes".  The patient's lipid goals are as follows: Total cholesterol goal is 200; LDL cholesterol goal is 100; HDL cholesterol goal is 40; Triglyceride goal is 150.  Her LDL cholesterol goal has been met.    Patient Instructions: 1)  Diabetes -  2)  You still have sugar in your urine which means your blood sugar is still too high. 3)  Start Novolog 10 units three times a day  4)  Take levemir at night still at 75 units. 5)  Your Hgba1c was 12.3 on last visit. It will be checked today. Remember the goal is less than 7. 6)  Blood pressure - doing ok.  Monitor salt in your diet 7)  Weight - you are down 4 more pounds since your last visit.  Keep up the good work. 8)  Try to join the [employer] which will start here on January 18th (read the flyer for more details) 9)  Refills on all your medications have been sent to [facility].  Be sure to take these as ordered.  Several of your medications come from patient assistance programs so there should not be a charge. 10)  Call for a follow up in 3 months for diabetes. 11)  You will need u/a, cbg, Hgba1c. Prescriptions: PEN NEEDLES 5/16" 31G X 8 MM MISC (INSULIN PEN NEEDLE) Use with inuslin four times per day  #100 x 11   Entered and Authorized by:   Lehman Prom FNP   Signed by:   Lehman Prom FNP on 12/11/2010   Method used:   Faxed to ...       [facility] - Pharmac (retail)       [address] [place], [place]  [address]       Ph: [phone]       Fax: [phone]   RxID:   [id number] LISINOPRIL-HYDROCHLOROTHIAZIDE 20-12.5 MG TABS (LISINOPRIL-HYDROCHLOROTHIAZIDE) Take 2 tablets by mouth  every morning  #60 x 11   Entered and Authorized by:    Lehman Prom FNP   Signed by:   Lehman Prom FNP on 12/11/2010   Method used:   Faxed to ...       [facility] - Pharmac (retail)  [address] [place], [place]  [address]       Ph: [phone]       Fax: [phone]   RxID:   [id number] NOVOLOG FLEXPEN 100 UNIT/ML SOLN (INSULIN ASPART) 10 units three times a day subcutaneously  before meals  #1 month qs x 11   Entered and Authorized by:   Lehman Prom FNP   Signed by:   Lehman Prom FNP on 12/11/2010   Method used:   Faxed to ...       [facility] - Pharmac (retail)       [address] [place], [place]  [address]       Ph: [phone]       Fax: [phone]   RxID:   [id number] PROTONIX 40 MG TBEC (PANTOPRAZOLE SODIUM) TAKE ONE (1) TABLET BY MOUTH EVERY DAY  #30 x 11   Entered and Authorized by:   Lehman Prom FNP   Signed by:   Lehman Prom FNP on 12/11/2010   Method used:   Faxed to ...       [facility] - Pharmac (retail)       [address] [place], [place]  [address]       Ph: [phone]       Fax: [phone]   RxID:   [id number] LEVEMIR FLEXPEN 100 UNIT/ML SOLN (INSULIN DETEMIR) INJECT 75  UNITS UNDER SKIN EACH DAY   GENE ICP LEVEMIR FLEXPEN 07/08  #1 month x 11   Entered and Authorized by:   Lehman Prom FNP   Signed by:   Lehman Prom FNP on 12/11/2010   Method used:   Faxed to ...       [facility] - Pharmac (retail)       [address] [place], [place]  [address]       Ph: [phone]       Fax: [phone]   RxID:   [id number] JANUVIA 100 MG TABS (SITAGLIPTIN PHOSPHATE) TAKE ONE TABLET BY MOUTH EVERY MORNING  #30 x 11   Entered and Authorized by:   Lehman Prom FNP   Signed by:   Lehman Prom FNP on 12/11/2010   Method used:   Faxed to ...       [facility] - Pharmac (retail)        [address] [place], [place]  [address]       Ph: [phone]       Fax: [phone]   RxID:   [id number] CRESTOR 5 MG TABS (ROSUVASTATIN CALCIUM) TAKE ONE (1) TABLET AT BEDTIME  #30 x 11   Entered and Authorized by:   Lehman Prom FNP   Signed by:   Lehman Prom FNP on 12/11/2010   Method used:   Faxed to ...       [facility] - Pharmac (retail)       [address] [place], [place]  [address]       Ph: [phone]       Fax: [phone]   RxID:   [id number] ACTOS 30 MG TABS (PIOGLITAZONE HCL) TAKE ONE TABLET BY MOUTH EVERY MORNING  #30 x 11   Entered and Authorized by:   Lehman Prom FNP   Signed by:  Lehman Prom FNP on 12/11/2010   Method used:   Faxed to ...       [facility] - Pharmac (retail)       [address] [place], [place]  [address]       Ph: [phone]       Fax: [phone]   RxID:   [id number]    Orders Added: 1)  Capillary Blood Glucose/CBG [82948] 2)  UA Dipstick w/o Micro (manual) [81002] 3)  Est. Patient Level IV [18841] 4)  T- Hemoglobin A1C [83036-23375]    Last LDL:                                                 126 (07/01/2010 9:02:00 PM)        Diabetic Foot Exam Last Podiatry Exam Date: 10/22/2009 Diabetic Foot Care Education :Patient educated on appropriate care of diabetic feet.  Pulse Check          Right Foot          Left Foot Dorsalis Pedis:        normal            normal    10-g (5.07) Semmes-Weinstein Monofilament Test Performed by: Levon Hedger          Right Foot          Left Foot Visual Inspection               Test Control      normal         normal Site 1         normal         normal Site 2         normal         normal Site 3         normal         normal Site 4         normal         normal Site 5         normal         normal Site 6         normal         normal Site 7         normal          normal Site 8         normal         normal Site 9         normal         normal Site 10         normal         normal  Impression      normal         normal   Laboratory Results   Urine Tests  Date/Time Received: December 11, 2010 10:52 AM   Routine Urinalysis   Color: lt. yellow Appearance: Hazy Glucose: >=1000   (Normal Range: Negative) Bilirubin: negative   (Normal Range: Negative) Ketone: negative   (Normal Range: Negative) Spec. Gravity: >=1.030   (Normal Range: 1.003-1.035) Blood: negative   (Normal Range: Negative) pH: 5.0   (Normal Range: 5.0-8.0) Protein: negative   (Normal Range: Negative) Urobilinogen: 0.2   (Normal Range:  0-1) Nitrite: negative   (Normal Range: Negative) Leukocyte Esterace: negative   (Normal Range: Negative)     Blood Tests     CBG Random:: 285

## 2010-12-31 NOTE — Progress Notes (Signed)
Summary: Pap results  Phone Note Outgoing Call   Summary of Call: notify that PAP did not show any cancer cells however she does have a yeast infection will order fluconazole 150mg  by mouth x 1 dose (rx in basket) fax to pt's pharmacy Initial call taken by: Lehman Prom FNP,  September 21, 2010 6:38 PM  Follow-up for Phone Call        CALLED [phone] COULD NOT LEAVE MESSAGE. Levon Hedger  September 22, 2010 5:42 PM   Additional Follow-up for Phone Call Additional follow up Details #1::        Pt. notified, RX faxed to [facility]. Additional Follow-up by: Gaylyn Cheers RN,  September 23, 2010 12:51 PM    New/Updated Medications: FLUCONAZOLE 150 MG TABS (FLUCONAZOLE) One tablet by mouth x 1 dose Prescriptions: FLUCONAZOLE 150 MG TABS (FLUCONAZOLE) One tablet by mouth x 1 dose  #1 x 0   Entered and Authorized by:   Lehman Prom FNP   Signed by:   Lehman Prom FNP on 09/21/2010   Method used:   Printed then faxed to ...       [facility] - Pharmac (retail)       [address] [place], [place]  [address]       Ph: [phone]       Fax: [phone]   RxID:   [id number]  Phone Note Outgoing Call   Summary of Call: notify that PAP did not show any cancer cells however she does have a yeast infection will order fluconazole 150mg  by mouth x 1 dose (rx in basket) fax to pt's pharmacy Initial call taken by: Lehman Prom FNP,  September 21, 2010 6:38 PM  Follow-up for Phone Call        CALLED [phone] COULD NOT LEAVE MESSAGE. Levon Hedger  September 22, 2010 5:42 PM   Additional Follow-up for Phone Call Additional follow up Details #1::        Pt. notified, RX faxed to [facility]. Additional Follow-up by: Gaylyn Cheers RN,  September 23, 2010 12:51 PM    New/Updated Medications: FLUCONAZOLE 150 MG TABS (FLUCONAZOLE) One tablet by mouth x 1 dose

## 2010-12-31 NOTE — Assessment & Plan Note (Signed)
Summary: Complete Physical Exam   Vital Signs:  Patient profile:   45 year old female Menstrual status:  regular LMP:     08/18/2010 Height:      63.25 inches Weight:      250 pounds BMI:     44.09 Temp:     96.5 degrees F oral Pulse rate:   80 / minute Pulse rhythm:   regular Resp:     18 per minute BP sitting:   140 / 92  (left arm) Cuff size:   large  Vitals Entered By: [place] Shannon (September 11, 2010 9:00 AM)  Nutrition Counseling: Patient's BMI is greater than 25 and therefore counseled on weight management options. CC: cpp, Hypertension Management, Lipid Management Is Patient Diabetic? Yes Pain Assessment Patient in pain? no      CBG Result 391  Does patient need assistance? Functional Status Self care Ambulation Normal LMP (date): 08/18/2010 LMP - Character: normal Menarche (age onset years): 11    Menstrual flow (days): 5 Menstrual Status regular Enter LMP: 08/18/2010 Last PAP Result normal   CC:  cpp, Hypertension Management, and Lipid Management.  History of Present Illness:  Pt into the office for a complete physical exam  PAP - last done 2009.  No family history of cervical or ovarian cancer. Menses - monthly. Regular.   Children - 2  Single - never married  Mammogram - last done in 2009. Mother and mother's sister with breast cancer. No self exam at home  Optho - wears glasses.  Last eye exam was in 2009.  Dental - No recent dental exam.  She would like some dental care.  Obesity - down 9 pounds since last visit  Pt did not bring her medications into the office.  Advised pt to bring meds to every visit. Review of pharmacy sheet shows that pt has not picked up meds since 06/2010  Diabetes Management History:      The patient is a 45 years old female who comes in for evaluation of Type 2 Diabetes Mellitus.  She has not been enrolled in the "Diabetic Education Program".  She states understanding of dietary principles and is following her  diet appropriately.  No sensory loss is reported.  Self foot exams are not being performed.  She is not checking home blood sugars.  She says that she is not exercising regularly.        Hypoglycemic symptoms are not occurring.  Hyperglycemic symptoms include polyuria and polydipsia.  Other comments include: Pt has not started taking the januvia and the novolog as ordered.        Symptoms which suggest diabetic complications include paresthesias.  The following changes have been made to her treatment plan since last visit: medication changes.  Treatment plan changes were initiated by patient.    Hypertension History:      She denies headache, chest pain, and palpitations.  She notes no problems with any antihypertensive medication side effects.        Positive major cardiovascular risk factors include diabetes, hyperlipidemia, hypertension, and family history for ischemic heart disease (males less than 12 years old).  Negative major cardiovascular risk factors include female age less than 66 years old and non-tobacco-user status.        Further assessment for target organ damage reveals no history of ASHD, stroke/TIA, peripheral vascular disease, renal insufficiency, or hypertensive retinopathy.    Lipid Management History:      Positive NCEP/ATP III risk factors include diabetes,  family history for ischemic heart disease (males less than 82 years old), and hypertension.  Negative NCEP/ATP III risk factors include female age less than 63 years old, no history of early menopause without estrogen hormone replacement, non-tobacco-user status, no ASHD (atherosclerotic heart disease), no prior stroke/TIA, no peripheral vascular disease, and no history of aortic aneurysm.        The patient states that she knows about the "Therapeutic Lifestyle Change" diet.  Her compliance with the TLC diet is poor.  The patient does not know about adjunctive measures for cholesterol lowering.  Adjunctive measures started by  the patient include weight reduction.  She expresses no side effects from her lipid-lowering medication.  The patient denies any symptoms to suggest myopathy or liver disease.     Habits & Providers  Alcohol-Tobacco-Diet     Alcohol drinks/day: 0     Tobacco Status: never     Passive Smoke Exposure: yes  Exercise-Depression-Behavior     Does Patient Exercise: yes     Exercise Counseling: to improve exercise regimen     Type of exercise: walk     Times/week: 3     Have you felt down or hopeless? no     Have you felt little pleasure in things? no     Depression Counseling: not indicated; screening negative for depression     Drug Use: no     Seat Belt Use: 100     Sun Exposure: occasionally  Comments: PHQ-9 score = 15  Current Medications (verified): 1)  Actos 30 Mg Tabs (Pioglitazone Hcl) .... Take One Tablet By Mouth Every Morning 2)  Crestor 5 Mg Tabs (Rosuvastatin Calcium) .... Take One (1) Tablet At Bedtime 3)  Januvia 100 Mg Tabs (Sitagliptin Phosphate) .... Take One Tablet By Mouth Every Morning 4)  Levemir Flexpen 100 Unit/ml Soln (Insulin Detemir) .... Inject 75  Units Under Skin Each Day   Gene Icp Levemir Flexpen 07/08 5)  Protonix 40 Mg Tbec (Pantoprazole Sodium) .... Take One (1) Tablet By Mouth Every Day 6)  Glucometer Strips .... 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 Take 2 Tablets By Mouth  Every Morning 8)  Novolog Flexpen 100 Unit/ml Soln (Insulin Aspart) .Marland Kitchen.. 10 Units Three Times A Day Subcutaneously  Before Meals 9)  Lancets  Misc (Lancets) .... Check Blood Sugar Three Times A Day  Allergies (verified): No Known Drug Allergies  Social History: 2 children Tobacco - none Drug - none ETOH - noneDoes Patient Exercise:  yes  Review of Systems General:  Denies fever. Eyes:  Denies blurring. ENT:  Denies earache. CV:  Denies fatigue. Resp:  Denies cough. GI:   Denies abdominal pain, nausea, and vomiting. GU:  Denies discharge. MS:  Denies joint pain. Derm:  Denies rash. Neuro:  Denies headaches. Psych:  Denies depression.  Physical Exam  General:  alert.   Head:  normocephalic.   Eyes:  glasses vision grossly intact, pupils equal, and pupils round.   Ears:  bil TM with bony landmarks present Nose:  no nasal discharge.   Mouth:  pharynx pink and moist and fair dentition.   Neck:  supple.   Chest Wall:  no mass.   Breasts:  skin/areolae normal.   Lungs:  normal breath sounds.   Heart:  normal rate and regular rhythm.   Abdomen:  soft, non-tender, and normal bowel sounds.   Rectal:  external hemorrhoid(s).   Msk:  up  to the exam table Extremities:  no edema Neurologic:  alert & oriented X3 and gait normal.   Skin:  color normal.   Psych:  Oriented X3.    Pelvic Exam  Vulva:      normal appearance.   Urethra and Bladder:      Urethra--normal.  Bladder--normal.   Vagina:      physiologic discharge.   Cervix:      midposition.   Uterus:      smooth.   Adnexa:      nontender bilaterally.   Rectum:      heme negative stool, + external hemorrhoids.    Diabetes Management Exam:    Foot Exam (with socks and/or shoes not present):       Sensory-Monofilament:          Left foot: normal          Right foot: normal       Nails:          Left foot: thickened          Right foot: thickened    Impression & Recommendations:  Problem # 1:  ROUTINE GYNECOLOGICAL EXAMINATION (ICD-V72.31) PAP done labs reviewed from previous visit EKG done guaiac negative rec optho and dental exam (handout about free dental clinic given) Orders: KOH/ WET Mount [phone]) Pap Smear, Thin Prep ( Collection of) (Q0091) T- GC Chlamydia (60454) Hemoccult Guaiac-1 spec.(in office) (82270)  Problem # 2:  UNSPECIFIED BREAST SCREENING (ICD-V76.10) self breast exam ordered mammogram ordered Orders: Mammogram (Screening) (Mammo)  Problem # 3:   DIABETES MELLITUS, TYPE II (ICD-250.00) BS still elevated b/c pt is not taking meds as ordered. advised pt to take meds - reviewed pharmacy sheet for pt and she has not picked up meds since 06/2010 Her updated medication list for this problem includes:    Actos 30 Mg Tabs (Pioglitazone hcl) .Marland Kitchen... Take one tablet by mouth every morning    Januvia 100 Mg Tabs (Sitagliptin phosphate) .Marland Kitchen... Take one tablet by mouth every morning    Levemir Flexpen 100 Unit/ml Soln (Insulin detemir) ..... Inject 75  units under skin each day   gene icp levemir flexpen 07/08    Lisinopril-hydrochlorothiazide 20-12.5 Mg Tabs (Lisinopril-hydrochlorothiazide) .Marland Kitchen... Take 2 tablets by mouth  every morning    Novolog Flexpen 100 Unit/ml Soln (Insulin aspart) .Marland KitchenMarland KitchenMarland KitchenMarland Kitchen 10 units three times a day subcutaneously  before meals  Orders: Capillary Blood Glucose/CBG (09811) UA Dipstick w/o Micro (manual) (91478)  Problem # 4:  HYPERLIPIDEMIA (ICD-272.4) continue current meds Her updated medication list for this problem includes:    Crestor 5 Mg Tabs (Rosuvastatin calcium) .Marland Kitchen... Take one (1) tablet at bedtime  Problem # 5:  HYPERTENSION (ICD-401.9) elevated today again ? compliance Her updated medication list for this problem includes:    Lisinopril-hydrochlorothiazide 20-12.5 Mg Tabs (Lisinopril-hydrochlorothiazide) .Marland Kitchen... Take 2 tablets by mouth  every morning  Orders: EKG w/ Interpretation (93000)  Problem # 6:  OBESITY (ICD-278.00) down 9 pounds since last visit - congrats to pt  Complete Medication List: 1)  Actos 30 Mg Tabs (Pioglitazone hcl) .... Take one tablet by mouth every morning 2)  Crestor 5 Mg Tabs (Rosuvastatin calcium) .... Take one (1) tablet at bedtime 3)  Januvia 100 Mg Tabs (Sitagliptin phosphate) .... Take one tablet by mouth every morning 4)  Levemir Flexpen 100 Unit/ml Soln (Insulin detemir) .... Inject 75  units under skin each day   gene icp levemir flexpen 07/08 5)  Protonix 40 Mg  Tbec  (Pantoprazole sodium) .... Take one (1) tablet by mouth every day 6)  Glucometer Strips  .... 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 Take 2 tablets by mouth  every morning 8)  Novolog Flexpen 100 Unit/ml Soln (Insulin aspart) .Marland Kitchen.. 10 units three times a day subcutaneously  before meals 9)  Lancets Misc (Lancets) .... Check blood sugar three times a day  Other Orders: Flu Vaccine 82yrs + [phone]) Admin 1st Vaccine (60454) Admin 1st Vaccine [facility]) [phone])  Diabetes Management Assessment/Plan:      The following lipid goals have been established for the patient: Total cholesterol goal of 200; LDL cholesterol goal of 100; HDL cholesterol goal of 40; Triglyceride goal of 150.  Her blood pressure goal is < 125/75.    Current Insulin Use:    Short Acting Insulin:       Type:  Novolog        Dosage:  10 units at breakfast; 10 units at lunch; 10 units at dinner;     Longer Acting Insulin:       Type:  Levemir       Dosage:  75 units units at bedtime.  Hypertension Assessment/Plan:      The patient's hypertensive risk group is category C: Target organ damage and/or diabetes.  Her calculated 10 year risk of coronary heart disease is 15 %.  Today's blood pressure is 140/92.  Her blood pressure goal is < 125/75.  Lipid Assessment/Plan:      Based on NCEP/ATP III, the patient's risk factor category is "history of diabetes".  The patient's lipid goals are as follows: Total cholesterol goal is 200; LDL cholesterol goal is 100; HDL cholesterol goal is 40; Triglyceride goal is 150.  Her LDL cholesterol goal has been met.     Patient Instructions: 1)  Keep your appointment for the mammogram 2)  You have been given the flu vaccine today. 3)  Diabetes - you need to get all your diabetes medications. 4)  They should all be available at the pharmacy. 5)  Check there to see when you can pick up.  Your blood sugar  is still high so you need to start. 6)  Blood pressure - slightly elevated today. Be sure to take medications as ordered 7)  Congrats on 9 pounds weight loss.  Keep up the good work at diet and exercise.   8)  Mood - You scored a  15 on your depression screening.  If you notice that you continue to have problems with mood, sleeping alot, abnormal feelings follow up with this Clide Remmers. 9)  Follow up with n.martin,fnp in 2 months for diabetes. 10)  Will need cbg, u/a, hgba1c  Last LDL:                                                 126 (07/01/2010 9:02:00 PM)          Diabetic Foot Exam Last Podiatry Exam Date: 10/22/2009    10-g (5.07) Semmes-Weinstein Monofilament Test Performed by: [place] Shannon          Right Foot          Left Foot Visual Inspection               Test Control  normal         normal Site 1         normal         normal Site 2         normal         normal Site 3         normal         normal Site 4         normal         normal Site 5         normal         normal Site 6         normal         normal Site 7         normal         normal Site 8         normal         normal Site 9         normal         normal Site 10         normal         normal  Impression      normal         normal   Prevention & Chronic Care Immunizations   Influenza vaccine: Fluvax 3+  (09/11/2010)    Tetanus booster: 07/14/2006: [facility]    Pneumococcal vaccine: Pneumovax  (07/08/2008)  Other Screening   Pap smear: normal  (07/09/2008)   Pap smear action/deferral: Ordered  (09/11/2010)    [facility]   Mammogram action/deferral: Ordered  (09/11/2010)   Smoking status: never  (09/11/2010)  Diabetes Mellitus   HgbA1C: 12.3  (07/01/2010)    Eye exam: [facility] per Pt.  (11/30/2007)    Foot exam: yes  (09/11/2010)   High risk foot: Yes  (10/22/2009)   Foot care education: Done  (10/22/2009)   Foot exam due: 06/23/2009     Urine microalbumin/creatinine ratio: Not documented  Lipids   Total Cholesterol: 189  (07/01/2010)   LDL: 126  (07/01/2010)   LDL Direct: Not documented   HDL: 40  (07/01/2010)   Triglycerides: 115  (07/01/2010)    SGOT (AST): 10  (07/01/2010)   SGPT (ALT): 12  (07/01/2010)   Alkaline phosphatase: 76  (07/01/2010)   Total bilirubin: 0.3  (07/01/2010)  Hypertension   Last Blood Pressure: 140 / 92  (09/11/2010)   Serum creatinine: 0.62  (07/01/2010)   Serum potassium 4.6  (07/01/2010)  Self-Management Support :    Diabetes self-management support: Not documented    Hypertension self-management support: Not documented    Lipid self-management support: Not documented    Nursing Instructions: Give Flu vaccine today   Laboratory Results   Urine Tests    Routine Urinalysis   Glucose: >=1000   (Normal Range: Negative) Bilirubin: negative   (Normal Range: Negative) Ketone: negative   (Normal Range: Negative) Spec. Gravity: <1.005   (Normal Range: 1.003-1.035) Blood: negative   (Normal Range: Negative) pH: 6.0   (Normal Range: 5.0-8.0) Protein: negative   (Normal Range: Negative) Urobilinogen: 0.2   (Normal Range: 0-1) Nitrite: negative   (Normal Range: Negative) Leukocyte Esterace: negative   (Normal Range: Negative)     Blood Tests     CBG Random:: 391mg /dL  Date/Time Received: September 11, 2010 9:48 AM   Wet Mount Source: vaginal WBC/hpf: 1-5 Bacteria/hpf: rare Clue  cells/hpf: none Yeast/hpf: none Wet [place] KOH: Negative Trichomonas/hpf: none  Stool - Occult Blood Hemmoccult #1: negative Date: 09/11/2010     Influenza Vaccine    Vaccine Type: Fluvax 3+    Site: left deltoid    Mfr: GlaxoSmithKline    Dose: 0.5 ml    Route: IM    Given by: [place] SHANNON    Exp. Date: 04/2011    Lot #: [id number]    VIS given: 06/23/10 version given September 11, 2010.  Flu Vaccine Consent Questions    Do you have a history of severe allergic reactions to  this vaccine? no    Any prior history of allergic reactions to egg and/or gelatin? no    Do you have a sensitivity to the preservative Thimersol? no    Do you have a past history of Guillan-Barre Syndrome? no    Do you currently have an acute febrile illness? no    Have you ever had a severe reaction to latex? no    Vaccine information given and explained to patient? yes    Are you currently pregnant? no

## 2010-12-31 NOTE — Progress Notes (Signed)
Summary: refills  Phone Note Call from Patient Call back at [phone]   Summary of Call: the pt came here last week and although the medical office sent by fax the refills request for actos, levemir and cestor when to the [facility]; they mentioned to the pt that still have not receiving the fax request.   Seem that she received refills for the rest with the exception of those three med listed above.  Please call Maretta Bees and return a phone call to the pt. [facility] FNP Initial call taken by: Manon Hilding,  July 06, 2010 3:57 PM  Follow-up for Phone Call        Spoke with [facility] -- asked that pt. come tomorrow to pharmacy and meds will be ready, will have forms ready. Dutch Quint RN  July 06, 2010 4:16 PM  Left message on answering machine for pt. to return call.  Dutch Quint RN  July 06, 2010 4:15 PM  Pt. made aware.  Dutch Quint RN  July 06, 2010 4:19 PM

## 2011-02-08 LAB — BASIC METABOLIC PANEL
CO2: 27 mEq/L (ref 19–32)
Calcium: 8.7 mg/dL (ref 8.4–10.5)
Glucose, Bld: 238 mg/dL — ABNORMAL HIGH (ref 70–99)
Sodium: 134 mEq/L — ABNORMAL LOW (ref 135–145)

## 2011-02-08 LAB — URINALYSIS, ROUTINE W REFLEX MICROSCOPIC
Glucose, UA: 500 mg/dL — AB
Hgb urine dipstick: NEGATIVE
Ketones, ur: NEGATIVE mg/dL
pH: 5.5 (ref 5.0–8.0)

## 2011-02-08 LAB — GLUCOSE, CAPILLARY: Glucose-Capillary: 175 mg/dL — ABNORMAL HIGH (ref 70–99)

## 2011-02-12 LAB — POCT URINALYSIS DIPSTICK
Glucose, UA: >=1000 mg/dL — AB
Specific Gravity, Urine: 1.01 (ref 1.005–1.030)
Urobilinogen, UA: 0.2 mg/dL (ref 0.0–1.0)

## 2011-02-12 LAB — POCT I-STAT, CHEM 8
BUN: 9 mg/dL (ref 6–23)
Calcium, Ion: 1.09 mmol/L — ABNORMAL LOW (ref 1.12–1.32)
Chloride: 104 mEq/L (ref 96–112)
Glucose, Bld: 389 mg/dL — ABNORMAL HIGH (ref 70–99)

## 2011-04-16 NOTE — H&P (Signed)
NAMECHEMIKA, Cindy [name]               ACCOUNT NO.:  [id number]   MEDICAL RECORD NO.:  [id number]          PATIENT TYPE:  INP   LOCATION:  2029                         FACILITY:  [facility]   PHYSICIAN:  Santiago Bumpers. Hensel, M.D.DATE OF BIRTH:  August 30, 1966   DATE OF ADMISSION:  06/20/2006  DATE OF DISCHARGE:                                HISTORY & PHYSICAL   PRIMARY CARE PHYSICIAN:  [facility].   CHIEF COMPLAINT:  Chest pain.   HISTORY OF PRESENT ILLNESS:  Cindy [name] is a 45 year old African-American  female with diabetes mellitus type 2 and hypertension who presents with  substernal chest pain and has pressure like something sitting on my chest  in nature and radiates down her left arm. It came on at 5 p.m. on June 19, 2006 while the patient was cooking and was relieved when she sat down. It  was associated with shortness of breath and nausea. She denies vomiting or  diaphoresis. She does admit to being light-headed on standing. The chest  pain recurred later in the evening when she stood up and did not go away  until she came to the emergency department and was being worked up. She is  currently chest pain free. The patient states that she has had chest pain  like this 2 years ago and had a negative Cardiolite and cath. At that time,  the pain was relieved with nitroglycerin. She does not have any  nitroglycerin currently at home. The patient was given 325 mg of aspirin in  the emergency room and a 1 liter bolus of normal saline.   PAST MEDICAL HISTORY:  1.  Type 2 diabetes mellitus.  2.  Hypertension.   MEDICATIONS:  1.  HCTZ 25 mg p.o. daily.  2.  Metformin 500 mg p.o. b.i.d.   ALLERGIES:  No known drug allergies.   FAMILY HISTORY:  Father deceased at age 22 with an MI. Mother had diabetes  mellitus type 2.   SOCIAL HISTORY:  Lives with a roommate and her 2 grown children. She works  at a group home. She has no tobacco history, no alcohol history, and no  illicit drug use.   REVIEW OF SYSTEMS:  CONSTITUTIONAL:  Negative for fevers, negative for  chills, positive for light-headed per HPI. Positive for a 15 pound weight  loss over the course of the month that was intentional. HEENT:  Positive for  headache that is chronic for which she takes aspirin, negative for URI  symptoms. CARDIOVASCULAR:  Positive for chest pain per HPI, positive for  palpitations. PULMONARY:  Positive for shortness of breath per HPI, no  cough. ABDOMEN:  No nausea, vomiting, diarrhea, constipation or change of  bowel habits. Positive for GERD symptoms. GU:  Positive for dysuria and  positive frequency x1 week. MUSCULOSKELETAL:  No muscular joint pain.  NEUROLOGIC:  No numbness, tingling or weakness. SKIN:  No rashes.   PHYSICAL EXAMINATION:  VITAL SIGNS:  Temperature 97.9, pulse 87, blood  pressure 130/84, respirations 20, satting 95% on room air.  GENERAL:  Pleasant, obese, African-American female in no acute distress.  HEENT:  Normocephalic. Pupils equal round and reactive to light. Extraocular  muscles intact, no scleral icterus. Oropharynx clear with no erythema and no  exudate. Moist mucous membranes.  CARDIOVASCULAR:  Regular rate and rhythm with no murmurs, rubs or gallops.  2+ peripheral pulses, no edema.  CHEST:  The patient's chest pain is exactly reproduced with palpation of the  left chest wall.  She reports having worked out on Wednesday and being sore  afterwards.  PULMONARY:  Lungs clear to auscultation bilaterally, no wheezes, rales or  rhonchi. No increased work of breathing.  ABDOMEN:  Soft, nontender, nondistended, obese, positive bowel sounds, no  organomegaly.  EXTREMITIES:  No edema, no swelling.  NEUROLOGIC:  Cranial nerves II-XII intact. Strength 5/5 bilaterally,  sensation intact bilaterally.  SKIN:  No rashes.   LABORATORY DATA:  White blood count 10.6, hemoglobin 12.5, hematocrit 37.8,  platelets 361, 56% PMN, 35% lymphocytes. Sodium 136, potassium 3.5,  chloride  102, BUN 7, blood sugars 327. Venous pH was 7.389, venous pCO2 was 24.3,  creatinine was pending, D-dimer was negative. Point of care enzymes, 2 sets  were negative.   A chest x-ray showed no acute process and EKG showed normal sinus rhythm  with no ST changes.   ASSESSMENT:  This is a 45 year old African-American female with a history of  diabetes and hypertension who presented with chest pain.   1.  Chest pain:  The patient describes typical angina that his brought on by      exertion and is relieved by rest and is substernal. Chest pressure      radiating down her left arm. Currently she has no EKG findings and 2      sets of point of care cardiac enzymes have been negative. However, given      her typical story and risk factors of positive family history, diabetes,      obesity and hypertension, we will admit her to a telemetry bed and rule      her out with 3 sets of cardiac enzymes and repeat an EKG in 8 hours. We      will start her on metoprolol 12.5 mg p.o. b.i.d., enalapril 2.5 mg p.o.      daily and will continue her on aspirin 325 mg daily. We will continue      her HCTZ 25 mg. We will start nitroglycerin p.r.n. and morphine p.r.n.      for pain. We will check a fasting lipid profile and we will look into      her records from her previous Cardiolite and cardiac cath. She will need      a stress test either outpatient or inpatient. The fact that her chest      pain is reproducible with chest wall palpation indicates that the chest      pain has at least a component of musculoskeletal chest pain and will      start a low dose of Indocin for this.  2.  Diabetes mellitus type 2:  The patient reports being poorly controlled      with typical blood sugars ranging in the 300s and 400s. She has skipped      a couple days of her metformin. Will hold her Metformin in the hospital     in case she needs a dye load during this hospitalization for a cardiac      study and we  will start sliding scale insulin. Will check a hemoglobin  A1c and start a carb modified diet.  3.  Hypertension:  The patient was slightly hypertensive in the emergency      department. Will continue her HCTZ and add metoprolol and enalapril.  4.  Dysuria:  Will check a cath urinalysis, urine culture and Gram's stain      to rule out urinary tract infection.  5.  Gastroesophageal reflux disease symptoms:  Will treat with Protonix 40      mg daily. This could also be contributing to her chest pain.  6.  Disposition:  Pending cardiac rule out and risk factor stratification.     ______________________________  Levander Campion, M.D.    ______________________________  Santiago Bumpers. Leveda Anna, M.D.    JH/MEDQ  D:  06/20/2006  T:  06/20/2006  Job:  [id number]

## 2011-04-16 NOTE — Discharge Summary (Signed)
NAMEREYANN, TROOP               ACCOUNT NO.:  [id number]   MEDICAL RECORD NO.:  [id number]          PATIENT TYPE:  INP   LOCATION:  2029                         FACILITY:  [facility]   PHYSICIAN:  Levander Campion, M.D.  DATE OF BIRTH:  08-30-1966   DATE OF ADMISSION:  06/20/2006  DATE OF DISCHARGE:  06/21/2006                                 DISCHARGE SUMMARY   DISCHARGE DIAGNOSES:  1.  Chest pain.  2.  Diabetes mellitus type 2.  3.  Hypertension.  4.  Hyperlipidemia.  5.  Gastroesophageal reflux disease.  6.  Dysuria.   DISCHARGE MEDICATIONS:  1.  Zocor 40 mg nightly.  2.  Hydrochlorothiazide 25 mg daily.  3.  Enalapril 2.5 mg daily.  4.  Glucophage 1000 mg twice daily.  5.  Aspirin 81 mg daily.   DISCHARGE INSTRUCTIONS:  The patient is to follow a carb-modified American  Diabetes Association diet.  She is to follow up with the [facility]  within 2 weeks and she is to call the [facility] to schedule an  exercise stress test in the next 30 days.   HOSPITAL COURSE:  1.  Chest pain.  Ms. Friedland is a 45 year old African American female with      type 2 diabetes and hypertension who presented with substernal chest      pain that was pressure in nature and radiated down her left arm.  It      first came on at 5 p.m. on June 19, 2006 while the patient was cooking.      It was relieved when she sat down.  It was associated with shortness of      breath and nausea, but not with vomiting or diaphoresis.  The chest pain      recurred later in the evening when the patient stood up and did no      subside until she came to the [facility] Emergency Department to be      evaluated.  At that time it went away on its own.  She has had chest      pain like this in the past and had an equivocal Cardiolite due to too      many artifacts and a clean catheterization.  She had 2 sets of point-of-      care enzymes in the ED, which were negative and 3 sets on admission that   were negative for myocardial injury.  She had 2 EKGs that showed no      change from previous EKGs and showed no ST elevations or depressions or      T-wave changes.  She remained pain free throughout the hospitalization.      It is unlikely that her chest pain, at this time, is of cardiac origin.      The patient has the risk factors of diabetes, a family history, and      hypertension.  Will recommend that she follow up with an exercise stress      test as an outpatient.  Her chest pain was reproducible with palpation  of the left chest wall and likely had an element of musculoskeletal      cause.  She also complained of some reflux symptoms and we treated here      with a PPI.  2.  Diabetes.  Patient regularly has sugars elevated in the 300s and 400s.      Here, her A1c was 10.8.  We increased her Glucophage to 1000 mg b.i.d.      The patient will need follow up with [facility] and a repeat A1c in 3      months.  We started her on enalapril 2.5 mg for renal protection.  3.  Hypertension.  Patient was hypertensive in the hospital in the 150s/90s.      She improved slightly with the addition of enalapril to her current      regimen of hydrochlorothiazide and may need further titration of the      enalapril as an outpatient.  Prevention:  The patient was found to have      an LDL of 128 with her diabetes.  Her goal is less than 100.  She was      started on Zocor 40 mg.  4.  Dysuria.  Patient complained of dysuria and frequency x1 week.  A      urinalysis showed no sign of UTI.           ______________________________  Levander Campion, M.D.     JH/MEDQ  D:  06/22/2006  T:  06/23/2006  Job:  [id number]   cc:   [facility]

## 2011-04-16 NOTE — Discharge Summary (Signed)
Cindy [name], READER               ACCOUNT NO.:  [id number]   MEDICAL RECORD NO.:  [id number]          PATIENT TYPE:  INP   LOCATION:  2029                         FACILITY:  [facility]   PHYSICIAN:  Levander Campion, M.D.  DATE OF BIRTH:  June 20, 1966   DATE OF ADMISSION:  06/20/2006  DATE OF DISCHARGE:  06/21/2006                                 DISCHARGE SUMMARY   DISCHARGE DIAGNOSES:  1.  Chest pain.  2.  Diabetes mellitus type 2.  3.  Hypertension.  4.  Hyperlipidemia.  5.  Gastroesophageal reflux disease.  6.  Dysuria.   DISCHARGE MEDICATIONS:  1.  Zocor 40 mg nightly.  2.  Hydrochlorothiazide 25 mg daily.  3.  Enalapril 2.5 mg daily.  4.  Glucophage 1000 mg twice daily.  5.  Aspirin 81 mg daily.   DISCHARGE INSTRUCTIONS:  Patient is to follow a carb-modified American  Diabetes Association diet.  She is to follow up with [facility] within 2  weeks and she is to call the family practice center and ask to speak to a  tech to schedule an exercise stress test within the next 30 days.   PROCEDURES:  1.  Transthoracic echocardiogram.   Dictation ended at this point.           ______________________________  Levander Campion, M.D.     JH/MEDQ  D:  06/22/2006  T:  06/23/2006  Job:  [id number]

## 2011-07-26 ENCOUNTER — Emergency Department (HOSPITAL_COMMUNITY): Payer: Self-pay

## 2011-07-26 ENCOUNTER — Encounter: Payer: Self-pay | Admitting: Internal Medicine

## 2011-07-26 ENCOUNTER — Observation Stay (HOSPITAL_COMMUNITY)
Admission: EM | Admit: 2011-07-26 | Discharge: 2011-07-27 | Disposition: A | Discharge status: Home / Self Care | Payer: Self-pay | Attending: Internal Medicine | Admitting: Internal Medicine

## 2011-07-26 DIAGNOSIS — M25519 Pain in unspecified shoulder: Secondary | ICD-10-CM | POA: Insufficient documentation

## 2011-07-26 DIAGNOSIS — E785 Hyperlipidemia, unspecified: Secondary | ICD-10-CM | POA: Insufficient documentation

## 2011-07-26 DIAGNOSIS — R0789 Other chest pain: Principal | ICD-10-CM | POA: Insufficient documentation

## 2011-07-26 DIAGNOSIS — R079 Chest pain, unspecified: Secondary | ICD-10-CM

## 2011-07-26 DIAGNOSIS — E119 Type 2 diabetes mellitus without complications: Secondary | ICD-10-CM | POA: Insufficient documentation

## 2011-07-26 DIAGNOSIS — Z8249 Family history of ischemic heart disease and other diseases of the circulatory system: Secondary | ICD-10-CM | POA: Insufficient documentation

## 2011-07-26 DIAGNOSIS — I10 Essential (primary) hypertension: Secondary | ICD-10-CM | POA: Insufficient documentation

## 2011-07-26 DIAGNOSIS — R0602 Shortness of breath: Secondary | ICD-10-CM | POA: Insufficient documentation

## 2011-07-26 LAB — BASIC METABOLIC PANEL
BUN: 12 mg/dL (ref 6–23)
CO2: 27 mEq/L (ref 19–32)
Chloride: 99 mEq/L (ref 96–112)
Creatinine, Ser: 0.54 mg/dL (ref 0.50–1.10)
Glucose, Bld: 283 mg/dL — ABNORMAL HIGH (ref 70–99)

## 2011-07-26 LAB — URINE MICROSCOPIC-ADD ON

## 2011-07-26 LAB — DIFFERENTIAL
Basophils Relative: 0 % (ref 0–1)
Monocytes Absolute: 0.5 10*3/uL (ref 0.1–1.0)
Monocytes Relative: 7 % (ref 3–12)
Neutro Abs: 4.4 10*3/uL (ref 1.7–7.7)

## 2011-07-26 LAB — URINALYSIS, ROUTINE W REFLEX MICROSCOPIC
Bilirubin Urine: NEGATIVE
Glucose, UA: >1000 mg/dL — AB
Ketones, ur: NEGATIVE mg/dL
pH: 5.5 (ref 5.0–8.0)

## 2011-07-26 LAB — CBC
HCT: 38.3 % (ref 36.0–46.0)
Hemoglobin: 12.6 g/dL (ref 12.0–15.0)
MCH: 30.4 pg (ref 26.0–34.0)
MCHC: 32.9 g/dL (ref 30.0–36.0)
MCV: 92.3 fL (ref 78.0–100.0)

## 2011-07-26 LAB — HEPATIC FUNCTION PANEL
Albumin: 3.1 g/dL — ABNORMAL LOW (ref 3.5–5.2)
Total Bilirubin: 0.2 mg/dL — ABNORMAL LOW (ref 0.3–1.2)
Total Protein: 7.6 g/dL (ref 6.0–8.3)

## 2011-07-26 LAB — HEMOGLOBIN A1C: Hgb A1c MFr Bld: 10.2 % — ABNORMAL HIGH (ref <5.7)

## 2011-07-26 LAB — GLUCOSE, CAPILLARY
Glucose-Capillary: 236 mg/dL — ABNORMAL HIGH (ref 70–99)
Glucose-Capillary: 189 mg/dL — ABNORMAL HIGH (ref 70–99)

## 2011-07-26 LAB — CARDIAC PANEL(CRET KIN+CKTOT+MB+TROPI)
CK, MB: 2.5 ng/mL (ref 0.3–4.0)
CK, MB: 2.2 ng/mL (ref 0.3–4.0)
Total CK: 118 U/L (ref 7–177)
Total CK: 113 U/L (ref 7–177)

## 2011-07-26 LAB — POCT I-STAT TROPONIN I: Troponin i, poc: 0 ng/mL (ref 0.00–0.08)

## 2011-07-26 NOTE — H&P (Signed)
Hospital Admission Note Date: 07/26/2011  Patient name:  Cindy [name]  Medical record number:  [id number] Date of birth:  04-11-1966  Age: 45 y.o. Gender:  female PCP:    Lehman Prom, NP, NP  Medical Service:   Internal Medicine Teaching Service   Attending physician:  Dr. Ulyess Mort First Contact:   Cindy Dorothyann Peng            Pager: [phone] Second Contact:   Dr. Gilford Rile  Pager: [phone] After Hours:    First Contact   Pager: [phone]      Second Contact  Pager: [phone]   Chief Complaint: Chest pain  History of Present Illness: Cindy.  [name] is a 45 year old female with a history of diabetes mellitus and hypertension who presents today with chest pain x 1 day.  Pt reports being in her usual state of health yesterday when she experienced the onset of chest pain while at rest.   Pt states chest pain feels like "something is sitting on my chest."  She endorses stiffness and aching pain in her left arm and shoulder that began at the same time.  Applied pressure to the shoulder improved the pain.  She tried taking Bayer Aspirin which did not alleviate the arm nor chest pain.  Both pains are worse with movement.  The chest pain persisted throughout the evening and worsened this morning with the onset of shortness of breath and "gasping for air.  She also states she could not move her left arm.  Pt denies nausea, vomiting, diaphoresis.   Denies association with activity and states the pain is different from her usual heartburn symptoms.  Denies cough, fever chills.  She had a similar episode one year ago with no findings.  Her last stress test was in 2008 (EF 69%, no ischemia, fixed thinning of anterior wall).  She has a family history of premature MI and CAD.   Current Outpatient Medications:   PMH:    -Diabetes Mellitus:  Pt admits noncompliance with medications, missing one-two days at a time.  States FSG typically range 225-230.   -HTN: States BP well controlled in 130s/70s with  Lisinopril. -GERD: Protonix -Hyperlipidemia: Statin  PCP at [facility] on [address].  Allergies: NKDA  FH:  Father died at age 68 from MI. Mother is living has h/o heart disease, breast cancer. SH:  Pt lives with 26yo daughter, son, and grandson.  She is currently uninsured.  Works in a group home. Substance history: Pt denies tobacco, EtOH, drug use.  ROS:  as stated in HPI.    VITALS: BP: 144/76, HR: 75/min, RR: 20, Temp: 97.5, O2: 99 on RA  PEXAM  -GEN: Well appearing African American female in NAD. Obese.  -HEENT: Sclera clear, anicteric.  EOM intact.  -CV: Heart sounds distant.  RRR.  No MRG.  -RESP:  No increased WOB.  CTAB anteriorly.  -ABD: Soft, NT, ND.     -EXT: No c/c/e.  Radial and DP pulses 2+.       Lab results: CBC:    Component Value Date/Time   WBC 7.9 07/26/2011 0930   HGB 12.6 07/26/2011 0930   HCT 38.3 07/26/2011 0930   PLT 390 07/26/2011 0930   MCV 92.3 07/26/2011 0930   NEUTROABS 4.4 07/26/2011 0930   LYMPHSABS 2.9 07/26/2011 0930   MONOABS 0.5 07/26/2011 0930   EOSABS 0.1 07/26/2011 0930   BASOSABS 0.0 07/26/2011 0930      Comprehensive Metabolic Panel:    Component Value  Date/Time   NA 134* 07/26/2011 0930   K 3.5 07/26/2011 0930   CL 99 07/26/2011 0930   CO2 27 07/26/2011 0930   BUN 12 07/26/2011 0930   CREATININE 0.54 07/26/2011 0930   GLUCOSE 283* 07/26/2011 0930   CALCIUM 9.2 07/26/2011 0930   AST 10 07/01/2010 2102   ALT 12 07/01/2010 2102   ALKPHOS 76 07/01/2010 2102   BILITOT 0.3 07/01/2010 2102   PROT 7.2 07/01/2010 2102   ALBUMIN 3.7 07/01/2010 2102     STUDIES in ER -EKG--  wnl.  RRR.  PR interval < 0.2.  Absent T waves in aVF.  Mild T wave inversion in III.  Q wave in V1, V2.    -Cardiac enzymes-Troponin  @0949 - 0.01  @1300 - 0.00 -CXR- Normal   Assessment & Plan: This is a 45yo female with h/o HTN and DM presenting with chest pain and left arm/shoulder pain x 1 day.  Pt is currently stable with both EKG and cardiac enzymes WNL.      1. Chest Pain   Admit to obs/telemetry   Meds: Aspirin 81mg  daily, Coreg 3.125 BID, Nitroglycerin 0.4mg  SL Q41min x3 prn CP, Morphine 2mg  Q4 prn CP   O2 to keep SPo2 >95   Cardiac enzymes x2 Q6   Repeat EKG in 12 hrs.   2D Echo   FLP, TSH for risk stratification 2. DM: continue home    Levimir Flex Pen 125units   Januvia 100mg    Actos 30mg     Hold Novolog 10units TID.Marland Kitchen Start SSI QAC & QHS   HBa1c 3. HTN   Continue home Lisinopril 60mg  daily 4. HLD   Continue home Crestor 20mg  qHS 5. GERD   Protonix 40mg  daily 6. PPX   Lovenox 40mg  daily SQ   Zofran 4mg  Q6 prn 7. Dispo f/u ekg, enzymes, echo, labs in a.m.    DVT PPX: Lovenox     MS4 :  ____________________________________    Date/ Time:      ____________________________________     Cindy [name], M.D. (Senior resident):    ____________________________________    Date/ Time:      ____________________________________     I have seen and examined the patient. I reviewed the resident/fellow note and agree with the findings and plan of care as documented. My additions and revisions are included.   Signature:  ____________________________________________     Internal Medicine Teaching Service Attending    Date:    ____________________________________________

## 2011-07-27 LAB — GLUCOSE, CAPILLARY
Glucose-Capillary: 94 mg/dL (ref 70–99)
Glucose-Capillary: 150 mg/dL — ABNORMAL HIGH (ref 70–99)

## 2011-07-27 LAB — LIPID PANEL
Cholesterol: 165 mg/dL (ref 0–200)
LDL Cholesterol: 111 mg/dL — ABNORMAL HIGH (ref 0–99)
Total CHOL/HDL Ratio: 5.3 RATIO
VLDL: 23 mg/dL (ref 0–40)

## 2011-07-27 LAB — BASIC METABOLIC PANEL
BUN: 17 mg/dL (ref 6–23)
CO2: 26 mEq/L (ref 19–32)
Calcium: 9 mg/dL (ref 8.4–10.5)
Chloride: 103 mEq/L (ref 96–112)
Creatinine, Ser: 0.62 mg/dL (ref 0.50–1.10)

## 2011-08-18 NOTE — Discharge Summary (Signed)
[name], [name]               ACCOUNT NO.:  [id number]  MEDICAL RECORD NO.:  [id number]  LOCATION:  2025                         FACILITY:  [facility]  PHYSICIAN:  C. Ulyess Mort, M.D.DATE OF BIRTH:  December 20, 1965  DATE OF ADMISSION:  07/26/2011 DATE OF DISCHARGE:  07/27/2011                              DISCHARGE SUMMARY   DISCHARGE DIAGNOSES: 1. Noncardiac chest pain. 2. Left arm and shoulder pain. 3. Diabetes mellitus. 4. Hypertension. 5. Hyperlipidemia.  DISCHARGE MEDICATIONS: 1. Aspirin 81 mg tablet 2 by mouth daily. 2. Carvedilol 3.125 mg by mouth twice daily with meals. 3. Actos 30 mg one tablet by mouth daily. 4. Crestor 20 mg one tablet by mouth daily at bedtime. 5. Januvia 100 mg one tablet by mouth daily. 6. Levemir FlexPen 125 units subcutaneously daily at bedtime. 7. Lisinopril 30 mg 2 tablets by mouth daily. 8. NovoLog 10 units subcutaneously 3 times a day. 9. Protonix 40 mg one tablet by mouth daily.  DISPOSITION AND FOLLOWUP:  Followup with Dr. Philipp Deputy at [facility] on August 19, 2011, at 8:30 a.m. for followup for chest and arm pain, modification of diabetes medication.  PROCEDURES:  None.  CONSULTATIONS:  None.  ADMITTING HISTORY AND PHYSICAL:  Cindy [name] is a 45 year old female with a history of diabetes and hypertension who presented with chest pain. The patient reported that she was in her usual state of health when she experienced onset of chest pain while at rest.  The patient states that the pain is felt like "something is sitting on my chest".  He endorses stiffness and aching pain in her left arm that began at the same time, apply pressure to the shoulder improved the pain.  Pain was worsened with movement.  She tried taking aspirin which did not alleviate the arm or chest pain.  The pain persisted throughout the evening and worsened in the morning with the onset of shortness of breath.  She denies nausea, vomiting,  diaphoresis.  Denies association with activity and states the pain is different from her usual heartburn symptoms.  Denies cough, fever, and chills.  Had a similar episode one year ago with a negative EKG and no stress test was performed at that time.  Her last stress test in 2008 was negative.  She has a family history of premature coronary artery disease.  PHYSICAL EXAM -GEN: Well appearing African American female in NAD. Obese. -HEENT: Sclera clear, anicteric.  EOM intact. -CV: Heart sounds distant.  RRR.  No MRG. -RESP:  No increased WOB.  CTAB anteriorly. -ABD: Soft, NT, ND.     -EXT: No c/c/e.  Radial and DP pulses 2+.      ADMITTING LABS:  White blood cell count 7.9, hemoglobin 12.6, hematocrit 38.3, and platelets 390.  Sodium 134, potassium 3.5, chloride 99, bicarb 27, BUN 12, creatinine 0.54, glucose 283, calcium 9.2, troponin 0.01 and 0.00.  HOSPITAL COURSE:   1. The patient was admitted to telemetry for observation. She had 2 sets of point-of-care enzymes in the ED which were negative and 2 sets on admission that were negative for myocardial injury.  She had 2 EKGs that showed no ST elevations or depressions  or T wave changes.  She had an echo that shows no abnormalities.  She remains pain- free throughout the hospitalization.  It is unlikely that her chest pain at this time is of cardiac origin.  No accompanying arm pain.  Shoulder pain is musculoskeletal in nature likely and unrelated to chest pain. The patient has risk factors of diabetes of family history and hypertension, therefore, continued on her beta-blocker and aspirin at discharge.  Warm compresses and over-the-counter pain medications recommended for arm pain. 2. Diabetes.  The patient regularly has sugars elevated in the 200s     and here her A1c was 10.2, maintained the patient on her home     Levemir FlexPen and Januvia with her Actos and placed her on     sliding scale insulin management.  The patient  will need to follow     up with [facility] for modification of her diabetes medication as     well as repeat A1c. 3. Hypertension.  The patient was continued on home lisinopril and     Coreg was added.  Her hypertension was controlled in the 110s over     60s during hospitalization. 4. Hyperlipidemia.  The patient's total cholesterol was normal with a     LDL of 111, HDL was 31, and was continued on her home Crestor.  VITAL SIGNS AT THE TIME OF DISCHARGE:  Temperature 98.3, pulse 85, respirations 18, systolic BP 90, diastolic BP 61, and O2 sats 100% on room air.  DISCHARGE LABS:  Sodium 140, potassium 3.3, chloride 103, bicarb 26, BUN 17, creatinine 0.62, glucose 93, hemoglobin A1c 10.2, cholesterol 165, triglycerides 115, HDL 31, and LDL 111.     Darnelle Maffucci, MD   ______________________________ C. Ulyess Mort, M.D.    PT/MEDQ  D:  07/27/2011  T:  07/27/2011  Job:  [id number]  cc:   [facility]  Electronically Signed by Darnelle Maffucci  on 08/11/2011 04:01:32 PM Electronically Signed by Eliezer Lofts M.D. on 08/18/2011 03:21:25 PM

## 2011-12-27 ENCOUNTER — Emergency Department: Payer: Self-pay | Admitting: Emergency Medicine

## 2012-06-12 ENCOUNTER — Emergency Department (HOSPITAL_COMMUNITY): Payer: No Typology Code available for payment source

## 2012-06-12 ENCOUNTER — Emergency Department (HOSPITAL_COMMUNITY)
Admission: EM | Admit: 2012-06-12 | Discharge: 2012-06-12 | Disposition: A | Discharge status: Home / Self Care | Payer: No Typology Code available for payment source | Attending: Emergency Medicine | Admitting: Emergency Medicine

## 2012-06-12 ENCOUNTER — Encounter (HOSPITAL_COMMUNITY): Payer: Self-pay | Admitting: Emergency Medicine

## 2012-06-12 DIAGNOSIS — E119 Type 2 diabetes mellitus without complications: Secondary | ICD-10-CM | POA: Insufficient documentation

## 2012-06-12 DIAGNOSIS — Z79899 Other long term (current) drug therapy: Secondary | ICD-10-CM | POA: Insufficient documentation

## 2012-06-12 DIAGNOSIS — I10 Essential (primary) hypertension: Secondary | ICD-10-CM | POA: Insufficient documentation

## 2012-06-12 DIAGNOSIS — S39012A Strain of muscle, fascia and tendon of lower back, initial encounter: Secondary | ICD-10-CM

## 2012-06-12 DIAGNOSIS — Z7982 Long term (current) use of aspirin: Secondary | ICD-10-CM | POA: Insufficient documentation

## 2012-06-12 DIAGNOSIS — M545 Low back pain, unspecified: Secondary | ICD-10-CM | POA: Insufficient documentation

## 2012-06-12 DIAGNOSIS — Z794 Long term (current) use of insulin: Secondary | ICD-10-CM | POA: Insufficient documentation

## 2012-06-12 DIAGNOSIS — IMO0001 Reserved for inherently not codable concepts without codable children: Secondary | ICD-10-CM | POA: Insufficient documentation

## 2012-06-12 DIAGNOSIS — S335XXA Sprain of ligaments of lumbar spine, initial encounter: Secondary | ICD-10-CM | POA: Insufficient documentation

## 2012-06-12 HISTORY — DX: Essential (primary) hypertension: I10

## 2012-06-12 MED ORDER — IBUPROFEN 600 MG PO TABS
600.0000 mg | ORAL_TABLET | Freq: Four times a day (QID) | ORAL | Status: AC | PRN
Start: 2012-06-12 — End: 2012-06-22

## 2012-06-12 MED ORDER — HYDROCODONE-ACETAMINOPHEN 5-325 MG PO TABS
1.0000 | ORAL_TABLET | Freq: Once | ORAL | Status: AC
  Administered 2012-06-12: 1 via ORAL
  Filled 2012-06-12: qty 1

## 2012-06-12 MED ORDER — METHOCARBAMOL 500 MG PO TABS
1000.0000 mg | ORAL_TABLET | Freq: Once | ORAL | Status: AC
  Administered 2012-06-12: 1000 mg via ORAL
  Filled 2012-06-12: qty 2

## 2012-06-12 MED ORDER — HYDROCODONE-ACETAMINOPHEN 5-325 MG PO TABS: 1.0000 | ORAL_TABLET | ORAL | Status: AC | PRN

## 2012-06-12 MED ORDER — METHOCARBAMOL 500 MG PO TABS: 500.0000 mg | ORAL_TABLET | Freq: Two times a day (BID) | ORAL | Status: AC

## 2012-06-12 MED ORDER — IBUPROFEN 200 MG PO TABS
400.0000 mg | ORAL_TABLET | Freq: Once | ORAL | Status: AC
  Administered 2012-06-12: 400 mg via ORAL
  Filled 2012-06-12: qty 2
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
## 2012-06-12 NOTE — Discharge Instructions (Signed)
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
## 2013-04-26 ENCOUNTER — Emergency Department: Payer: Self-pay | Admitting: Emergency Medicine

## 2013-08-17 ENCOUNTER — Emergency Department (HOSPITAL_COMMUNITY)
Admission: EM | Admit: 2013-08-17 | Discharge: 2013-08-18 | Disposition: A | Discharge status: Home / Self Care | Payer: BC Managed Care – PPO | Attending: Emergency Medicine | Admitting: Emergency Medicine

## 2013-08-17 DIAGNOSIS — W19XXXA Unspecified fall, initial encounter: Secondary | ICD-10-CM

## 2013-08-17 DIAGNOSIS — Z7982 Long term (current) use of aspirin: Secondary | ICD-10-CM | POA: Insufficient documentation

## 2013-08-17 DIAGNOSIS — W1809XA Striking against other object with subsequent fall, initial encounter: Secondary | ICD-10-CM | POA: Insufficient documentation

## 2013-08-17 DIAGNOSIS — E119 Type 2 diabetes mellitus without complications: Secondary | ICD-10-CM | POA: Insufficient documentation

## 2013-08-17 DIAGNOSIS — Z79899 Other long term (current) drug therapy: Secondary | ICD-10-CM | POA: Insufficient documentation

## 2013-08-17 DIAGNOSIS — S80212A Abrasion, left knee, initial encounter: Secondary | ICD-10-CM

## 2013-08-17 DIAGNOSIS — IMO0002 Reserved for concepts with insufficient information to code with codable children: Secondary | ICD-10-CM | POA: Insufficient documentation

## 2013-08-17 DIAGNOSIS — Y9210 Unspecified residential institution as the place of occurrence of the external cause: Secondary | ICD-10-CM | POA: Insufficient documentation

## 2013-08-17 DIAGNOSIS — I10 Essential (primary) hypertension: Secondary | ICD-10-CM | POA: Insufficient documentation

## 2013-08-17 DIAGNOSIS — S50312A Abrasion of left elbow, initial encounter: Secondary | ICD-10-CM

## 2013-08-17 DIAGNOSIS — Y9389 Activity, other specified: Secondary | ICD-10-CM | POA: Insufficient documentation

## 2013-08-17 DIAGNOSIS — Z794 Long term (current) use of insulin: Secondary | ICD-10-CM | POA: Insufficient documentation

## 2013-08-18 ENCOUNTER — Encounter (HOSPITAL_COMMUNITY): Payer: Self-pay
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
## 2013-08-22 ENCOUNTER — Encounter (HOSPITAL_COMMUNITY): Payer: Self-pay | Admitting: Emergency Medicine

## 2013-08-22 ENCOUNTER — Emergency Department (HOSPITAL_COMMUNITY): Payer: BC Managed Care – PPO

## 2013-08-22 ENCOUNTER — Emergency Department (HOSPITAL_COMMUNITY)
Admission: EM | Admit: 2013-08-22 | Discharge: 2013-08-23 | Disposition: A | Discharge status: Home / Self Care | Payer: BC Managed Care – PPO | Attending: Emergency Medicine | Admitting: Emergency Medicine

## 2013-08-22 DIAGNOSIS — E119 Type 2 diabetes mellitus without complications: Secondary | ICD-10-CM | POA: Insufficient documentation

## 2013-08-22 DIAGNOSIS — L03116 Cellulitis of left lower limb: Secondary | ICD-10-CM

## 2013-08-22 DIAGNOSIS — I10 Essential (primary) hypertension: Secondary | ICD-10-CM | POA: Insufficient documentation

## 2013-08-22 DIAGNOSIS — Z79899 Other long term (current) drug therapy: Secondary | ICD-10-CM | POA: Insufficient documentation

## 2013-08-22 DIAGNOSIS — L02419 Cutaneous abscess of limb, unspecified: Secondary | ICD-10-CM | POA: Insufficient documentation

## 2013-08-22 DIAGNOSIS — Z794 Long term (current) use of insulin: Secondary | ICD-10-CM | POA: Insufficient documentation

## 2013-08-22 DIAGNOSIS — Z87828 Personal history of other (healed) physical injury and trauma: Secondary | ICD-10-CM | POA: Insufficient documentation

## 2013-08-22 DIAGNOSIS — Z7982 Long term (current) use of aspirin: Secondary | ICD-10-CM | POA: Insufficient documentation

## 2013-08-22 MED ORDER — HYDROCODONE-ACETAMINOPHEN 5-325 MG PO TABS
1.0000 | ORAL_TABLET | Freq: Once | ORAL | Status: AC
  Administered 2013-08-22: 1 via ORAL
  Filled 2013-08-22: qty 1

## 2013-08-22 NOTE — ED Provider Notes (Signed)
CSN: [id number]     Arrival date & time 08/22/13  2255 History   First MD Initiated Contact with Patient 08/22/13 2306     Chief Complaint  Patient presents with  . Cellulitis   (Consider location/radiation/quality/duration/timing/severity/associated sxs/prior Treatment) HPI Comments: Patient is a 47 year old female past medical history significant for DM, hypertension presenting to the emergency department for increased erythema, swelling, and pain to the LLE after initial injury on 08/17/13, at which time she was evaluated for this injury. Patient states that she did not have swelling, erythema, purulent drainage after initial injury this has been developing since being seen. She states her pain is burning in nature there is alleviated with rest, while ambulating aggravates her pain. She denies fevers or chills.    Past Medical History  Diagnosis Date  . Diabetes mellitus   . Hypertension    History reviewed. No pertinent past surgical history. No family history on file. History  Substance Use Topics  . Smoking status: Never Smoker   . Smokeless tobacco: Not on file  . Alcohol Use: No   OB History   Grav Para Term Preterm Abortions TAB SAB Ect Mult Living                 Review of Systems  Constitutional: Negative for fever and chills.  HENT: Negative.   Eyes: Negative.   Respiratory: Negative.   Cardiovascular: Negative.   Gastrointestinal: Negative.   Genitourinary: Negative.   Musculoskeletal: Positive for myalgias and arthralgias.  Skin: Positive for wound.  Neurological: Negative.     Allergies  Review of patient's allergies indicates no known allergies.  Home Medications   Current Outpatient Rx  Name  Route  Sig  Dispense  Refill  . aspirin 325 MG tablet   Oral   Take 325 mg by mouth daily.         . hydrochlorothiazide (HYDRODIURIL) 25 MG tablet   Oral   Take 25 mg by mouth daily.         . insulin aspart protamine- aspart (NOVOLOG MIX 70/30)  (70-30) 100 UNIT/ML injection   Subcutaneous   Inject 10 Units into the skin 2 (two) times daily with a meal.         . HYDROcodone-acetaminophen (NORCO/VICODIN) 5-325 MG per tablet   Oral   Take 1 tablet by mouth every 6 (six) hours as needed for pain.   10 tablet   0   . sulfamethoxazole-trimethoprim (SEPTRA DS) 800-160 MG per tablet   Oral   Take 1 tablet by mouth every 12 (twelve) hours.   20 tablet   0    BP 159/62  Pulse 87  Temp(Src) 97.8 F (36.6 C) (Oral)  Resp 16  Ht 5\' 3"  (1.6 m)  Wt 245 lb (111.131 kg)  BMI 43.41 kg/m2  SpO2 97%  LMP 07/18/2013 Physical Exam  Constitutional: She is oriented to person, place, and time. She appears well-developed and well-nourished.  HENT:  Head: Normocephalic and atraumatic.  Eyes: Conjunctivae are normal.  Neck: Neck supple.  Cardiovascular: Intact distal pulses.   Pulmonary/Chest: Effort normal and breath sounds normal.  Abdominal: Soft. There is no tenderness.  Musculoskeletal:       Left ankle: Normal.       Right lower leg: She exhibits tenderness and swelling. She exhibits no deformity.       Left foot: Normal.  Neurological: She is alert and oriented to person, place, and time.  Skin: Skin is warm and  dry. Abrasion noted. There is erythema.     Abrasion 3 cm x 5 cm on anterior LLE w/o drainage w/ surrounding warm and erythematous 6 cm x 7 cm region. Area TTP.   2cm x 2cm erythematous warm TTP area on anterior LLE w/ small abrasion, no drainage.     ED Course  Procedures (including critical care time) Medications  HYDROcodone-acetaminophen (NORCO/VICODIN) 5-325 MG per tablet 1 tablet (1 tablet Oral Given 08/22/13 2355)    Labs Review Labs Reviewed  CBC WITH DIFFERENTIAL   Imaging Review Dg Tibia/fibula Left  08/23/2013   CLINICAL DATA:  Left lower leg cellulitis.  EXAM: LEFT TIBIA AND FIBULA - 2 VIEW  COMPARISON:  None.  FINDINGS: No soft tissue gas collection or radiopaque foreign body is identified.  There is no fracture or bony destructive change. Dorsal calcaneal spur is noted.  IMPRESSION: No acute finding.   Electronically Signed   By: Drusilla Kanner M.D.   On: 08/23/2013 00:07    MDM   1. Cellulitis of left lower leg    Afebrile, NAD, non-toxic appearing, AAOx4. Suspect uncomplicated cellulitis based on limited area of involvement, minimal pain, no systemic signs of illness (eg, fever, chills, dehydration, altered mental status, tachypnea, tachycardia, hypotension). PE reveals redness, swelling, mildly tender, warm to touch. Skin intact, No bleeding. No bullae. Non purulent. Non circumferential.  Borders are not elevated or sharply demarcated.  No evidence of occult abscess. Drew a line around the area of infection. Pt was instructed to return to the ED if area surpasses the boarder or pain intensifies. I have reviewed nursing notes, vital signs, and all appropriate lab and imaging results for this patient.pain improved in ED. Will prescribed Bactrim to cover for MRSA, direct pt to apply warm compresses and to return to ED for I&D if pain should increase or abscess should develop. Advised PCP f/u in 1-2 days. Patient is agreeable to plan. Patient d/w with Dr. Juleen China, agrees with plan. Patient is stable at time of discharge.         Jeannetta Ellis, PA-C 08/23/13 0050

## 2013-08-22 NOTE — ED Notes (Signed)
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

## 2013-08-23 LAB — CBC WITH DIFFERENTIAL/PLATELET
Eosinophils Absolute: 0.1 10*3/uL (ref 0.0–0.7)
Eosinophils Relative: 1 % (ref 0–5)
Hemoglobin: 12.5 g/dL (ref 12.0–15.0)
Lymphocytes Relative: 39 % (ref 12–46)
Lymphs Abs: 3.3 10*3/uL (ref 0.7–4.0)
MCH: 30.9 pg (ref 26.0–34.0)
MCV: 94.1 fL (ref 78.0–100.0)
Monocytes Relative: 9 % (ref 3–12)
Platelets: 392 10*3/uL (ref 150–400)
RBC: 4.05 MIL/uL (ref 3.87–5.11)
WBC: 8.4 10*3/uL (ref 4.0–10.5)

## 2013-08-23 MED ORDER — SULFAMETHOXAZOLE-TRIMETHOPRIM 800-160 MG PO TABS: 1.0000 | ORAL_TABLET | Freq: Two times a day (BID) | ORAL | Status: DC

## 2013-08-23 MED ORDER — HYDROCODONE-ACETAMINOPHEN 5-325 MG PO TABS: 1.0000 | ORAL_TABLET | Freq: Four times a day (QID) | ORAL | Status: DC | PRN

## 2013-08-23 NOTE — ED Provider Notes (Signed)
Medical screening examination/treatment/procedure(s) were conducted as a shared visit with non-physician practitioner(s) and myself.  I personally evaluated the patient during the encounter.  47 year old female with cellulitis of the left lower extremity. Feel she is appropriate for outpatient treatment at this time. Continued wound care and return precautions were discussed.  Raeford Razor, MD 08/23/13 [phone]

## 2014-04-04 ENCOUNTER — Emergency Department: Payer: Self-pay | Admitting: Emergency Medicine

## 2014-04-05 LAB — CBC WITH DIFFERENTIAL/PLATELET
Basophil #: 0 10*3/uL (ref 0.0–0.1)
Basophil %: 0.4 %
EOS ABS: 0.3 10*3/uL (ref 0.0–0.7)
Eosinophil %: 2.6 %
HCT: 39.9 % (ref 35.0–47.0)
HGB: 13 g/dL (ref 12.0–16.0)
LYMPHS ABS: 3.7 10*3/uL — AB (ref 1.0–3.6)
LYMPHS PCT: 36.1 %
MCH: 31.3 pg (ref 26.0–34.0)
MCHC: 32.7 g/dL (ref 32.0–36.0)
MCV: 96 fL (ref 80–100)
MONO ABS: 0.9 x10 3/mm (ref 0.2–0.9)
Monocyte %: 8.9 %
NEUTROS PCT: 52 %
Neutrophil #: 5.3 10*3/uL (ref 1.4–6.5)
Platelet: 327 10*3/uL (ref 150–440)
RBC: 4.16 10*6/uL (ref 3.80–5.20)
RDW: 13.5 % (ref 11.5–14.5)
WBC: 10.2 10*3/uL (ref 3.6–11.0)

## 2014-04-05 LAB — BASIC METABOLIC PANEL
Anion Gap: 7 (ref 7–16)
BUN: 18 mg/dL (ref 7–18)
CHLORIDE: 102 mmol/L (ref 98–107)
CREATININE: 1.03 mg/dL (ref 0.60–1.30)
Calcium, Total: 9.4 mg/dL (ref 8.5–10.1)
Co2: 28 mmol/L (ref 21–32)
EGFR (Non-African Amer.): >60
Glucose: 303 mg/dL — ABNORMAL HIGH (ref 65–99)
Osmolality: 287 (ref 275–301)
Potassium: 4.1 mmol/L (ref 3.5–5.1)
Sodium: 137 mmol/L (ref 136–145)

## 2014-04-05 LAB — URINALYSIS, COMPLETE
Bilirubin,UR: NEGATIVE
Glucose,UR: >=500 mg/dL (ref 0–75)
KETONE: NEGATIVE
Nitrite: NEGATIVE
Ph: 5 (ref 4.5–8.0)
Protein: 100
RBC,UR: 1 /HPF (ref 0–5)
SPECIFIC GRAVITY: 1.031 (ref 1.003–1.030)

## 2014-04-05 LAB — PRO B NATRIURETIC PEPTIDE: B-TYPE NATIURETIC PEPTID: 25 pg/mL (ref 0–125)

## 2014-04-05 LAB — TROPONIN I

## 2014-06-22 ENCOUNTER — Emergency Department: Payer: Self-pay | Admitting: Emergency Medicine

## 2014-06-22 LAB — URINALYSIS, COMPLETE
BACTERIA: NONE SEEN
BLOOD: NEGATIVE
Bilirubin,UR: NEGATIVE
KETONE: NEGATIVE
LEUKOCYTE ESTERASE: NEGATIVE
NITRITE: NEGATIVE
PH: 5 (ref 4.5–8.0)
Protein: NEGATIVE
SPECIFIC GRAVITY: 1.033 (ref 1.003–1.030)

## 2015-08-03 ENCOUNTER — Emergency Department
Admission: EM | Admit: 2015-08-03 | Discharge: 2015-08-03 | Disposition: A | Discharge status: Home / Self Care | Payer: 59 | Attending: Emergency Medicine | Admitting: Emergency Medicine

## 2015-08-03 ENCOUNTER — Encounter: Payer: Self-pay | Admitting: Emergency Medicine

## 2015-08-03 DIAGNOSIS — I10 Essential (primary) hypertension: Secondary | ICD-10-CM | POA: Insufficient documentation

## 2015-08-03 DIAGNOSIS — Z792 Long term (current) use of antibiotics: Secondary | ICD-10-CM | POA: Diagnosis not present

## 2015-08-03 DIAGNOSIS — Y9289 Other specified places as the place of occurrence of the external cause: Secondary | ICD-10-CM | POA: Insufficient documentation

## 2015-08-03 DIAGNOSIS — Z7982 Long term (current) use of aspirin: Secondary | ICD-10-CM | POA: Insufficient documentation

## 2015-08-03 DIAGNOSIS — Z794 Long term (current) use of insulin: Secondary | ICD-10-CM | POA: Diagnosis not present

## 2015-08-03 DIAGNOSIS — Z791 Long term (current) use of non-steroidal anti-inflammatories (NSAID): Secondary | ICD-10-CM | POA: Insufficient documentation

## 2015-08-03 DIAGNOSIS — E119 Type 2 diabetes mellitus without complications: Secondary | ICD-10-CM | POA: Diagnosis not present

## 2015-08-03 DIAGNOSIS — S39012A Strain of muscle, fascia and tendon of lower back, initial encounter: Secondary | ICD-10-CM | POA: Diagnosis not present

## 2015-08-03 DIAGNOSIS — Y9389 Activity, other specified: Secondary | ICD-10-CM | POA: Diagnosis not present

## 2015-08-03 DIAGNOSIS — Y998 Other external cause status: Secondary | ICD-10-CM | POA: Insufficient documentation

## 2015-08-03 DIAGNOSIS — X58XXXA Exposure to other specified factors, initial encounter: Secondary | ICD-10-CM | POA: Diagnosis not present

## 2015-08-03 DIAGNOSIS — M545 Low back pain: Secondary | ICD-10-CM | POA: Diagnosis present

## 2015-08-03 LAB — URINALYSIS COMPLETE WITH MICROSCOPIC (ARMC ONLY)
BACTERIA UA: NONE SEEN
BILIRUBIN URINE: NEGATIVE
HGB URINE DIPSTICK: NEGATIVE
Ketones, ur: NEGATIVE mg/dL
Leukocytes, UA: NEGATIVE
NITRITE: NEGATIVE
Protein, ur: NEGATIVE mg/dL
Specific Gravity, Urine: 1.037 — ABNORMAL HIGH (ref 1.005–1.030)
pH: 5 (ref 5.0–8.0)

## 2015-08-03 MED ORDER — ORPHENADRINE CITRATE 30 MG/ML IJ SOLN
60.0000 mg | INTRAMUSCULAR | Status: AC
  Administered 2015-08-03: 60 mg via INTRAMUSCULAR
  Filled 2015-08-03: qty 2

## 2015-08-03 MED ORDER — KETOROLAC TROMETHAMINE 10 MG PO TABS: 10.0000 mg | ORAL_TABLET | Freq: Three times a day (TID) | ORAL | Status: DC

## 2015-08-03 MED ORDER — CYCLOBENZAPRINE HCL 5 MG PO TABS: 5.0000 mg | ORAL_TABLET | Freq: Three times a day (TID) | ORAL | Status: DC | PRN

## 2015-08-03 MED ORDER — KETOROLAC TROMETHAMINE 60 MG/2ML IM SOLN
60.0000 mg | Freq: Once | INTRAMUSCULAR | Status: AC
  Administered 2015-08-03: 60 mg via INTRAMUSCULAR
  Filled 2015-08-03: qty 2

## 2015-08-03 NOTE — ED Notes (Signed)
Pt reports muscle spasms to middle of back on right side; took 3 tylenol 1 hour PTA.

## 2015-08-03 NOTE — ED Provider Notes (Signed)
[facility] Emergency Department Provider Note ____________________________________________  Time seen: 1405  I have reviewed the triage vital signs and the nursing notes.  HISTORY  Chief Complaint  Back Pain  HPI Cindy [name] is a 49 y.o. female reports to the ED with mid to low back pain on the right side for the last week and a half. She describes onset was on Thursday, but denies any injury, trauma, accident, or contusion.She describes the pain as a grabbing and squeezing discomfort that is intermittent. Pain is decreased dosing Tylenol on schedule, is also slightly decreased after voiding. She notes increased discomfort with movement of the upper 70s of the back. She denies any fevers, chills, sweats, and has been without any abdominal discomfort, or dysuria. She describes the pain at a 10/10 at its worse.  Past Medical History  Diagnosis Date  . Diabetes mellitus   . Hypertension     Patient Active Problem List   Diagnosis Date Noted  . OBESITY 07/01/2010  . Type II or unspecified type diabetes mellitus without mention of complication, not stated as uncontrolled 05/08/2008  . HYPERLIPIDEMIA 05/08/2008  . HYPERTENSION 05/08/2008   History reviewed. No pertinent past surgical history.  Current Outpatient Rx  Name  Route  Sig  Dispense  Refill  . aspirin 325 MG tablet   Oral   Take 325 mg by mouth daily.         . cyclobenzaprine (FLEXERIL) 5 MG tablet   Oral   Take 1 tablet (5 mg total) by mouth 3 (three) times daily as needed for muscle spasms.   15 tablet   0   . hydrochlorothiazide (HYDRODIURIL) 25 MG tablet   Oral   Take 25 mg by mouth daily.         Marland Kitchen HYDROcodone-acetaminophen (NORCO/VICODIN) 5-325 MG per tablet   Oral   Take 1 tablet by mouth every 6 (six) hours as needed for pain.   10 tablet   0   . insulin aspart protamine- aspart (NOVOLOG MIX 70/30) (70-30) 100 UNIT/ML injection   Subcutaneous   Inject 10 Units into  the skin 2 (two) times daily with a meal.         . ketorolac (TORADOL) 10 MG tablet   Oral   Take 1 tablet (10 mg total) by mouth every 8 (eight) hours.   15 tablet   0   . sulfamethoxazole-trimethoprim (SEPTRA DS) 800-160 MG per tablet   Oral   Take 1 tablet by mouth every 12 (twelve) hours.   20 tablet   0    Allergies Review of patient's allergies indicates no known allergies.  No family history on file.  Social History Social History  Substance Use Topics  . Smoking status: Never Smoker   . Smokeless tobacco: None  . Alcohol Use: No   Review of Systems  Constitutional: Negative for fever. Eyes: Negative for visual changes. ENT: Negative for sore throat. Cardiovascular: Negative for chest pain. Respiratory: Negative for shortness of breath. Gastrointestinal: Negative for abdominal pain, vomiting and diarrhea. Genitourinary: Negative for dysuria. Musculoskeletal: Positive for back pain. Skin: Negative for rash. Neurological: Negative for headaches, focal weakness or numbness. ____________________________________________  PHYSICAL EXAM:  VITAL SIGNS: ED Triage Vitals  Enc Vitals Group     BP 08/03/15 1336 154/81 mmHg     Pulse Rate 08/03/15 1336 88     Resp 08/03/15 1336 20     Temp 08/03/15 1336 97.5 F (36.4 C)  Temp Source 08/03/15 1336 Oral     SpO2 08/03/15 1336 98 %     Weight 08/03/15 1336 247 lb (112.038 kg)     Height 08/03/15 1336 5\' 4"  (1.626 m)     Head Cir --      Peak Flow --      Pain Score 08/03/15 1336 10     Pain Loc --      Pain Edu? --      Excl. in [place]? --    Constitutional: Alert and oriented. Well appearing and in no distress. Eyes: Conjunctivae are normal. PERRL. Normal extraocular movements. ENT   Head: Normocephalic and atraumatic.   Nose: No congestion/rhinorrhea.   Mouth/Throat: Mucous membranes are moist.   Neck: Supple. No thyromegaly. Hematological/Lymphatic/Immunological: No cervical  lymphadenopathy. Cardiovascular: Normal rate, regular rhythm.  Respiratory: Normal respiratory effort. No wheezes/rales/rhonchi. Gastrointestinal: Soft and nontender. No distention, rebound, guarding, or organomegaly. Tender to palp over the left flank.  Musculoskeletal: Normal spinal alignment without midline tenderness, spasm, or step-off. Nontender with normal range of motion in all extremities.  Neurologic:  Normal gait without ataxia. Normal speech and language. No gross focal neurologic deficits are appreciated. Skin:  Skin is warm, dry and intact. No rash noted. Psychiatric: Mood and affect are normal. Patient exhibits appropriate insight and judgment. ____________________________________________   LABS (pertinent positives/negatives) Labs Reviewed  URINALYSIS COMPLETEWITH MICROSCOPIC ([place] ONLY) - Abnormal; Notable for the following:    Color, Urine STRAW (*)    APPearance CLEAR (*)    Glucose, UA >500 (*)    Specific Gravity, Urine 1.037 (*)    Squamous Epithelial / LPF 0-5 (*)    All other components within normal limits  ___________________________________________  PROCEDURES  Toradol 60 mg IM Norflex 60 mg IM ____________________________________________  INITIAL IMPRESSION / ASSESSMENT AND PLAN / ED COURSE  Reproducible musculoskeletal pain consistent with lumbar sacral strain. We'll prescribe Flexeril and Toradol for pain management. Patient is followed with her primary care provider for ongoing symptoms, and return to the ED for worsening symptoms. ____________________________________________  FINAL CLINICAL IMPRESSION(S) / ED DIAGNOSES  Final diagnoses:  Lumbosacral strain, initial encounter     Melvenia Needles, PA-C 08/03/15 1622  Daymon Larsen, MD 08/04/15 [phone]

## 2015-08-03 NOTE — ED Notes (Signed)
States she developed pain to right flank/mid back pain since Thursday  Denies any injury. Pt is very tearful on arrival . Denies any urinary sx's but states pain eases off slightly after voiding

## 2015-08-03 NOTE — ED Notes (Signed)
No S&S of reaction to IM injection, no apparent distress.

## 2015-08-03 NOTE — Discharge Instructions (Signed)
Muscle Strain A muscle strain is an injury that occurs when a muscle is stretched beyond its normal length. Usually a small number of muscle fibers are torn when this happens. Muscle strain is rated in degrees. First-degree strains have the least amount of muscle fiber tearing and pain. Second-degree and third-degree strains have increasingly more tearing and pain.  Usually, recovery from muscle strain takes 1-2 weeks. Complete healing takes 5-6 weeks.  CAUSES  Muscle strain happens when a sudden, violent force placed on a muscle stretches it too far. This may occur with lifting, sports, or a fall.  RISK FACTORS Muscle strain is especially common in athletes.  SIGNS AND SYMPTOMS At the site of the muscle strain, there may be:  Pain.  Bruising.  Swelling.  Difficulty using the muscle due to pain or lack of normal function. DIAGNOSIS  Your health care provider will perform a physical exam and ask about your medical history. TREATMENT  Often, the best treatment for a muscle strain is resting, icing, and applying cold compresses to the injured area.  HOME CARE INSTRUCTIONS   Use the PRICE method of treatment to promote muscle healing during the first 2-3 days after your injury. The PRICE method involves:  Protecting the muscle from being injured again.  Restricting your activity and resting the injured body part.  Icing your injury. To do this, put ice in a plastic bag. Place a towel between your skin and the bag. Then, apply the ice and leave it on from 15-20 minutes each hour. After the third day, switch to moist heat packs.  Apply compression to the injured area with a splint or elastic bandage. Be careful not to wrap it too tightly. This may interfere with blood circulation or increase swelling.  Elevate the injured body part above the level of your heart as often as you can.  Only take over-the-counter or prescription medicines for pain, discomfort, or fever as directed by your  health care provider.  Warming up prior to exercise helps to prevent future muscle strains. SEEK MEDICAL CARE IF:   You have increasing pain or swelling in the injured area.  You have numbness, tingling, or a significant loss of strength in the injured area. MAKE SURE YOU:   Understand these instructions.  Will watch your condition.  Will get help right away if you are not doing well or get worse. Document Released: 11/15/2005 Document Revised: 09/05/2013 Document Reviewed: 06/14/2013 [facility] Patient Information 2015 [place], [place]. This information is not intended to replace advice given to you by your health care provider. Make sure you discuss any questions you have with your health care provider.  Your exam is essentially normal. You may be experiencing muscle strain on the right side of the lower back. Take the prescription meds as directed. Follow-up with [facility] [place] or your provider for continued symptoms.

## 2015-10-05 ENCOUNTER — Encounter: Payer: Self-pay | Admitting: Emergency Medicine

## 2015-10-05 ENCOUNTER — Emergency Department
Admission: EM | Admit: 2015-10-05 | Discharge: 2015-10-05 | Disposition: A | Discharge status: Home / Self Care | Payer: 59 | Attending: Emergency Medicine | Admitting: Emergency Medicine

## 2015-10-05 ENCOUNTER — Emergency Department: Payer: 59

## 2015-10-05 DIAGNOSIS — E119 Type 2 diabetes mellitus without complications: Secondary | ICD-10-CM | POA: Insufficient documentation

## 2015-10-05 DIAGNOSIS — R079 Chest pain, unspecified: Secondary | ICD-10-CM | POA: Diagnosis present

## 2015-10-05 DIAGNOSIS — Z794 Long term (current) use of insulin: Secondary | ICD-10-CM | POA: Diagnosis not present

## 2015-10-05 DIAGNOSIS — Z79899 Other long term (current) drug therapy: Secondary | ICD-10-CM | POA: Diagnosis not present

## 2015-10-05 DIAGNOSIS — R0789 Other chest pain: Secondary | ICD-10-CM | POA: Diagnosis not present

## 2015-10-05 DIAGNOSIS — Z7982 Long term (current) use of aspirin: Secondary | ICD-10-CM | POA: Diagnosis not present

## 2015-10-05 DIAGNOSIS — R0602 Shortness of breath: Secondary | ICD-10-CM | POA: Insufficient documentation

## 2015-10-05 DIAGNOSIS — M2550 Pain in unspecified joint: Secondary | ICD-10-CM | POA: Insufficient documentation

## 2015-10-05 DIAGNOSIS — I10 Essential (primary) hypertension: Secondary | ICD-10-CM | POA: Insufficient documentation

## 2015-10-05 LAB — BASIC METABOLIC PANEL
Anion gap: 5 (ref 5–15)
BUN: 15 mg/dL (ref 6–20)
CALCIUM: 9.3 mg/dL (ref 8.9–10.3)
CHLORIDE: 101 mmol/L (ref 101–111)
CO2: 28 mmol/L (ref 22–32)
CREATININE: 0.74 mg/dL (ref 0.44–1.00)
GFR calc Af Amer: >60 mL/min (ref >60)
GFR calc non Af Amer: >60 mL/min (ref >60)
Glucose, Bld: 236 mg/dL — ABNORMAL HIGH (ref 65–99)
Potassium: 3.6 mmol/L (ref 3.5–5.1)
SODIUM: 134 mmol/L — AB (ref 135–145)

## 2015-10-05 LAB — CBC
HCT: 37.1 % (ref 35.0–47.0)
Hemoglobin: 12.1 g/dL (ref 12.0–16.0)
MCH: 30.8 pg (ref 26.0–34.0)
MCHC: 32.5 g/dL (ref 32.0–36.0)
MCV: 94.8 fL (ref 80.0–100.0)
PLATELETS: 378 10*3/uL (ref 150–440)
RBC: 3.92 MIL/uL (ref 3.80–5.20)
RDW: 13.3 % (ref 11.5–14.5)
WBC: 10.1 10*3/uL (ref 3.6–11.0)

## 2015-10-05 LAB — TROPONIN I
Troponin I: <0.03 ng/mL (ref <0.031)
Troponin I: <0.03 ng/mL (ref <0.031)

## 2015-10-05 MED ORDER — KETOROLAC TROMETHAMINE 30 MG/ML IJ SOLN
30.0000 mg | Freq: Once | INTRAMUSCULAR | Status: AC
  Administered 2015-10-05: 30 mg via INTRAVENOUS
  Filled 2015-10-05: qty 1

## 2015-10-05 NOTE — ED Provider Notes (Signed)
[facility] Emergency Department Provider Note  ____________________________________________  Time seen: Approximately 8:59 PM  I have reviewed the triage vital signs and the nursing notes.   HISTORY  Chief Complaint Chest Pain    HPI Cindy [name] is a 49 y.o. female patient reports chest heaviness this morning is worse if she bends forward better she stands up. She says if she walks around a climbs stairs she gets short of breath. Sometimes chest pain gets worse when she does this as well. She is short of breath with this chest pain has no sweating or nausea though she reports she had this once before years ago but never had a diagnosis for it. Patient also reports for the last few days she's been having joint aches and pains whenever she takes the Januvia that she was prescribed for her diabetes. Today she didn't take the Januvia and the pain in her joints did not occur. She had this problem with Januvia before and was stopped. Patient reports the pain has eased up a little bit since she's been here in the emergency room. Patient took a baby aspirin this morning. The pain is moderate in severity.   Past Medical History  Diagnosis Date  . Diabetes mellitus   . Hypertension     Patient Active Problem List   Diagnosis Date Noted  . OBESITY 07/01/2010  . Type II or unspecified type diabetes mellitus without mention of complication, not stated as uncontrolled 05/08/2008  . HYPERLIPIDEMIA 05/08/2008  . HYPERTENSION 05/08/2008    History reviewed. No pertinent past surgical history.  Current Outpatient Rx  Name  Route  Sig  Dispense  Refill  . aspirin 325 MG tablet   Oral   Take 325 mg by mouth daily.         . hydrochlorothiazide (HYDRODIURIL) 25 MG tablet   Oral   Take 25 mg by mouth daily.         . insulin detemir (LEVEMIR) 100 UNIT/ML injection   Subcutaneous   Inject 40 Units into the skin at bedtime.         Marland Kitchen lisinopril  (PRINIVIL,ZESTRIL) 20 MG tablet   Oral   Take 20 mg by mouth daily.         Marland Kitchen LOVASTATIN PO   Oral   Take 1 tablet by mouth daily.         . metFORMIN (GLUCOPHAGE) 500 MG tablet   Oral   Take 500 mg by mouth 2 (two) times daily with a meal.         . SITagliptin Phosphate (JANUVIA PO)   Oral   Take 1 tablet by mouth daily.         . cyclobenzaprine (FLEXERIL) 5 MG tablet   Oral   Take 1 tablet (5 mg total) by mouth 3 (three) times daily as needed for muscle spasms. Patient not taking: Reported on 10/05/2015   15 tablet   0   . HYDROcodone-acetaminophen (NORCO/VICODIN) 5-325 MG per tablet   Oral   Take 1 tablet by mouth every 6 (six) hours as needed for pain. Patient not taking: Reported on 10/05/2015   10 tablet   0   . ketorolac (TORADOL) 10 MG tablet   Oral   Take 1 tablet (10 mg total) by mouth every 8 (eight) hours. Patient not taking: Reported on 10/05/2015   15 tablet   0   . sulfamethoxazole-trimethoprim (SEPTRA DS) 800-160 MG per tablet   Oral  Take 1 tablet by mouth every 12 (twelve) hours. Patient not taking: Reported on 10/05/2015   20 tablet   0     Allergies Review of patient's allergies indicates no known allergies.  History reviewed. No pertinent family history.  Social History Social History  Substance Use Topics  . Smoking status: Never Smoker   . Smokeless tobacco: None  . Alcohol Use: No    Review of Systems Constitutional: No fever/chills Eyes: No visual changes. ENT: No sore throat. Cardiovascular: chest pain. Respiratory:  shortness of breath. Gastrointestinal: No abdominal pain.  No nausea, no vomiting.  No diarrhea.  No constipation. Genitourinary: Negative for dysuria. Musculoskeletal: Negative for back pain. Skin: Negative for rash. Neurological: Negative for headaches, focal weakness or numbness.  10-point ROS otherwise negative.  ____________________________________________   PHYSICAL EXAM:  VITAL  SIGNS: ED Triage Vitals  Enc Vitals Group     BP 10/05/15 1732 149/89 mmHg     Pulse Rate 10/05/15 1732 83     Resp 10/05/15 1732 18     Temp 10/05/15 1732 98.2 F (36.8 C)     Temp Source 10/05/15 1732 Oral     SpO2 10/05/15 1732 97 %     Weight 10/05/15 1732 245 lb (111.131 kg)     Height 10/05/15 1732 5\' 4"  (1.626 m)     Head Cir --      Peak Flow --      Pain Score 10/05/15 1733 9     Pain Loc --      Pain Edu? --      Excl. in [place]? --     Constitutional: Alert and oriented. Well appearing and in no acute distress. Eyes: Conjunctivae are normal. PERRL. EOMI. Head: Atraumatic. Nose: No congestion/rhinnorhea. Mouth/Throat: Mucous membranes are moist.  Oropharynx non-erythematous. Neck: No stridor.  Cardiovascular: Normal rate, regular rhythm. Grossly normal heart sounds.  Good peripheral circulation. Respiratory: Normal respiratory effort.  No retractions. Lungs CTAB. Outpatient anterior chest along the costochondral margins exactly reproduces the chest pain. Sitting up in bed exactly reproduces the chest pain. Taking a deep breath exactly reproduces the chest pain.  Gastrointestinal: Soft and nontender. No distention. No abdominal bruits. No CVA tenderness. Musculoskeletal: No lower extremity tenderness nor edema.  No joint effusions. Neurologic:  Normal speech and language. No gross focal neurologic deficits are appreciated. No gait instability. Skin:  Skin is warm, dry and intact. No rash noted.   ____________________________________________   LABS (all labs ordered are listed, but only abnormal results are displayed)  Labs Reviewed  BASIC METABOLIC PANEL - Abnormal; Notable for the following:    Sodium 134 (*)    Glucose, Bld 236 (*)    All other components within normal limits  CBC  TROPONIN I  TROPONIN I   ____________________________________________  EKG  EKG read and interpreted by me shows normal sinus rhythm rate of 86 normal axis similar to EKG from  04/05/2014 ____________________________________________  RADIOLOGY  Chest x-ray read by radiology as possible bronchitis ____________________________________________   PROCEDURES   ____________________________________________   INITIAL IMPRESSION / ASSESSMENT AND PLAN / ED COURSE  Pertinent labs & imaging results that were available during my care of the patient were reviewed by me and considered in my medical decision making (see chart for details).  Patient feels better after the Toradol. Patient's second troponin is negative. Patient has chest wall pain on examination. ____________________________________________   FINAL CLINICAL IMPRESSION(S) / ED DIAGNOSES  Final diagnoses:  Chest wall pain  Nena Polio, MD 10/05/15 [phone]

## 2015-10-05 NOTE — Discharge Instructions (Signed)
Chest Wall Pain Chest wall pain is pain in or around the bones and muscles of your chest. Sometimes, an injury causes this pain. Sometimes, the cause may not be known. This pain may take several weeks or longer to get better. HOME CARE INSTRUCTIONS  Pay attention to any changes in your symptoms. Take these actions to help with your pain:   Rest as told by your health care provider.   Avoid activities that cause pain. These include any activities that use your chest muscles or your abdominal and side muscles to lift heavy items.   If directed, apply ice to the painful area:  Put ice in a plastic bag.  Place a towel between your skin and the bag.  Leave the ice on for 20 minutes, 2-3 times per day.  Take over-the-counter and prescription medicines only as told by your health care provider.  Do not use tobacco products, including cigarettes, chewing tobacco, and e-cigarettes. If you need help quitting, ask your health care provider.  Keep all follow-up visits as told by your health care provider. This is important. SEEK MEDICAL CARE IF:  You have a fever.  Your chest pain becomes worse.  You have new symptoms. SEEK IMMEDIATE MEDICAL CARE IF:  You have nausea or vomiting.  You feel sweaty or light-headed.  You have a cough with phlegm (sputum) or you cough up blood.  You develop shortness of breath.   This information is not intended to replace advice given to you by your health care provider. Make sure you discuss any questions you have with your health care provider.   Document Released: 11/15/2005 Document Revised: 08/06/2015 Document Reviewed: 02/10/2015 Elsevier Interactive Patient Education [employer]. Please return if you're worse if you have a heavy pressure pain that lasts more than 10 minutes or so or if you get short of breath. Use the Toradol one pill 4 times a day for the next couple days and then stop

## 2015-10-05 NOTE — ED Notes (Signed)
Pt presents to ER with family for central chest pain . "feels like something is sitting on my chest". 9/10

## 2015-10-05 NOTE — ED Notes (Signed)
MD at bedside. 

## 2015-10-29 ENCOUNTER — Ambulatory Visit: Payer: 59 | Admitting: *Deleted

## 2015-10-31 ENCOUNTER — Emergency Department (HOSPITAL_COMMUNITY): Payer: 59

## 2015-10-31 ENCOUNTER — Emergency Department (HOSPITAL_COMMUNITY)
Admission: EM | Admit: 2015-10-31 | Discharge: 2015-10-31 | Disposition: A | Discharge status: Home / Self Care | Payer: 59 | Attending: Emergency Medicine | Admitting: Emergency Medicine

## 2015-10-31 ENCOUNTER — Encounter (HOSPITAL_COMMUNITY): Payer: Self-pay | Admitting: Emergency Medicine

## 2015-10-31 DIAGNOSIS — I10 Essential (primary) hypertension: Secondary | ICD-10-CM | POA: Diagnosis not present

## 2015-10-31 DIAGNOSIS — E785 Hyperlipidemia, unspecified: Secondary | ICD-10-CM | POA: Insufficient documentation

## 2015-10-31 DIAGNOSIS — Z7984 Long term (current) use of oral hypoglycemic drugs: Secondary | ICD-10-CM | POA: Diagnosis not present

## 2015-10-31 DIAGNOSIS — E119 Type 2 diabetes mellitus without complications: Secondary | ICD-10-CM | POA: Diagnosis not present

## 2015-10-31 DIAGNOSIS — Z7982 Long term (current) use of aspirin: Secondary | ICD-10-CM | POA: Insufficient documentation

## 2015-10-31 DIAGNOSIS — R1013 Epigastric pain: Secondary | ICD-10-CM | POA: Insufficient documentation

## 2015-10-31 DIAGNOSIS — Z794 Long term (current) use of insulin: Secondary | ICD-10-CM | POA: Insufficient documentation

## 2015-10-31 DIAGNOSIS — K920 Hematemesis: Secondary | ICD-10-CM | POA: Diagnosis present

## 2015-10-31 DIAGNOSIS — R42 Dizziness and giddiness: Secondary | ICD-10-CM | POA: Diagnosis not present

## 2015-10-31 DIAGNOSIS — R042 Hemoptysis: Secondary | ICD-10-CM

## 2015-10-31 DIAGNOSIS — R231 Pallor: Secondary | ICD-10-CM | POA: Insufficient documentation

## 2015-10-31 HISTORY — DX: Hyperlipidemia, unspecified: E78.5

## 2015-10-31 LAB — COMPREHENSIVE METABOLIC PANEL
ALBUMIN: 3.6 g/dL (ref 3.5–5.0)
ALT: 15 U/L (ref 14–54)
ANION GAP: 9 (ref 5–15)
AST: 16 U/L (ref 15–41)
Alkaline Phosphatase: 78 U/L (ref 38–126)
BUN: 16 mg/dL (ref 6–20)
CHLORIDE: 99 mmol/L — AB (ref 101–111)
CO2: 27 mmol/L (ref 22–32)
CREATININE: 0.71 mg/dL (ref 0.44–1.00)
Calcium: 9 mg/dL (ref 8.9–10.3)
GFR calc Af Amer: >60 mL/min (ref >60)
GFR calc non Af Amer: >60 mL/min (ref >60)
Glucose, Bld: 367 mg/dL — ABNORMAL HIGH (ref 65–99)
Potassium: 3.8 mmol/L (ref 3.5–5.1)
SODIUM: 135 mmol/L (ref 135–145)
Total Bilirubin: 0.3 mg/dL (ref 0.3–1.2)
Total Protein: 7.7 g/dL (ref 6.5–8.1)

## 2015-10-31 LAB — CBC
HCT: 36.1 % (ref 36.0–46.0)
Hemoglobin: 11.5 g/dL — ABNORMAL LOW (ref 12.0–15.0)
MCH: 30.7 pg (ref 26.0–34.0)
MCHC: 31.9 g/dL (ref 30.0–36.0)
MCV: 96.5 fL (ref 78.0–100.0)
PLATELETS: 396 10*3/uL (ref 150–400)
RBC: 3.74 MIL/uL — ABNORMAL LOW (ref 3.87–5.11)
RDW: 12.8 % (ref 11.5–15.5)
WBC: 10.5 10*3/uL (ref 4.0–10.5)

## 2015-10-31 LAB — TYPE AND SCREEN
ABO/RH(D): A POS
ANTIBODY SCREEN: NEGATIVE

## 2015-10-31 LAB — ABO/RH: ABO/RH(D): A POS

## 2015-10-31 MED ORDER — SUCRALFATE 1 GM/10ML PO SUSP: 1.0000 g | Freq: Three times a day (TID) | ORAL | Status: DC

## 2015-10-31 MED ORDER — SUCRALFATE 1 GM/10ML PO SUSP
1.0000 g | Freq: Three times a day (TID) | ORAL | Status: DC
  Administered 2015-10-31: 1 g via ORAL
  Filled 2015-10-31 (×6): qty 10

## 2015-10-31 MED ORDER — SODIUM CHLORIDE 0.9 % IV BOLUS (SEPSIS)
1000.0000 mL | Freq: Once | INTRAVENOUS | Status: AC
  Administered 2015-10-31: 1000 mL via INTRAVENOUS

## 2015-10-31 MED ORDER — MORPHINE SULFATE (PF) 4 MG/ML IV SOLN
4.0000 mg | Freq: Once | INTRAVENOUS | Status: DC
  Administered 2015-10-31: 4 mg via INTRAVENOUS
  Filled 2015-10-31: qty 1

## 2015-10-31 MED ORDER — MORPHINE SULFATE (PF) 2 MG/ML IV SOLN: 2.0000 mg | Freq: Once | INTRAVENOUS | Status: AC

## 2015-10-31 MED ORDER — ONDANSETRON HCL 4 MG/2ML IJ SOLN
4.0000 mg | Freq: Once | INTRAMUSCULAR | Status: AC
  Administered 2015-10-31: 4 mg via INTRAVENOUS
  Filled 2015-10-31: qty 2

## 2015-10-31 NOTE — ED Notes (Addendum)
Pt states she has some type of reaction to medication when she had chest pain in 2012. She was not sure what she was given.States medication started with a "F" Patient states she felt warm all over and began to itch. Sister endorses that pt. Started jerking and made her worse. Denies SOB or swelling in face. Pharmacist and NP aware and asked if Morphine is safe to administer. Will give medication for pain and continue to monitor patient.

## 2015-10-31 NOTE — Discharge Instructions (Signed)
Take the medication as directed  Call [facility] for an appointment for further evalaution

## 2015-10-31 NOTE — ED Provider Notes (Signed)
CSN: [id number]     Arrival date & time 10/31/15  0206 History   First MD Initiated Contact with Patient 10/31/15 0230     Chief Complaint  Patient presents with  . Hematemesis     (Consider location/radiation/quality/duration/timing/severity/associated sxs/prior Treatment) HPI Comments: This is a obese, African-American female with a history of diabetes, hypertension, hyperlipidemia who has recently been evaluated for chronic recurrent chest pain by cardiology with no definitive diagnosis.  States she went to bed last side in her normal state of health but was awakened just prior to arrival with cough and while she was in the bathroom.  She had 4-5 episodes of brown bloody emesis.  She states about a week ago that she had 2 days of black stool.  Denies the use of Pepto-Bismol regular doses of ibuprofen.  She does not have a history of GI bleed, gastric ulcer.  During initial interview, patient suddenly became acutely vertiginous with increased nausea, no vomiting.  The history is provided by the patient.    Past Medical History  Diagnosis Date  . Diabetes mellitus   . Hypertension   . Hyperlipemia    History reviewed. No pertinent past surgical history. History reviewed. No pertinent family history. Social History  Substance Use Topics  . Smoking status: Never Smoker   . Smokeless tobacco: None  . Alcohol Use: No   OB History    No data available     Review of Systems  Constitutional: Negative for fever and chills.  Respiratory: Negative for shortness of breath.   Gastrointestinal: Positive for nausea, vomiting and abdominal pain. Negative for diarrhea and rectal pain.  Genitourinary: Negative for dysuria.  Musculoskeletal: Negative for myalgias.  Skin: Positive for pallor.  Neurological: Positive for dizziness. Negative for weakness and headaches.  All other systems reviewed and are negative.     Allergies  Other  Home Medications   Prior to Admission medications    Medication Sig Start Date End Date Taking? Authorizing Provider  acetaminophen (TYLENOL) 500 MG tablet Take 1,500 mg by mouth every 6 (six) hours as needed for mild pain.   Yes Historical Provider, MD  aspirin 325 MG tablet Take 325 mg by mouth daily.   Yes Historical Provider, MD  aspirin-sod bicarb-citric acid (ALKA-SELTZER) 325 MG TBEF tablet Take 325 mg by mouth every 6 (six) hours as needed (acid reflux).   Yes Historical Provider, MD  hydrochlorothiazide (HYDRODIURIL) 25 MG tablet Take 25 mg by mouth daily.   Yes Historical Provider, MD  ibuprofen (ADVIL,MOTRIN) 200 MG tablet Take 800 mg by mouth every 6 (six) hours as needed for moderate pain.   Yes Historical Provider, MD  insulin detemir (LEVEMIR) 100 UNIT/ML injection Inject 40 Units into the skin at bedtime.   Yes Historical Provider, MD  lisinopril (PRINIVIL,ZESTRIL) 20 MG tablet Take 20 mg by mouth daily.   Yes Historical Provider, MD  LOVASTATIN PO Take 20 mg by mouth daily.    Yes Historical Provider, MD  metFORMIN (GLUCOPHAGE) 500 MG tablet Take 500 mg by mouth 2 (two) times daily with a meal.   Yes Historical Provider, MD  cyclobenzaprine (FLEXERIL) 5 MG tablet Take 1 tablet (5 mg total) by mouth 3 (three) times daily as needed for muscle spasms. Patient not taking: Reported on 10/05/2015 08/03/15   Dannielle Karvonen Menshew, PA-C  HYDROcodone-acetaminophen (NORCO/VICODIN) 5-325 MG per tablet Take 1 tablet by mouth every 6 (six) hours as needed for pain. Patient not taking: Reported on 10/05/2015 08/23/13  Jennifer Piepenbrink, PA-C  ketorolac (TORADOL) 10 MG tablet Take 1 tablet (10 mg total) by mouth every 8 (eight) hours. Patient not taking: Reported on 10/05/2015 08/03/15   Dannielle Karvonen Menshew, PA-C  sucralfate (CARAFATE) 1 GM/10ML suspension Take 10 mLs (1 g total) by mouth 4 (four) times daily -  with meals and at bedtime. 10/31/15   Junius Creamer, NP  sulfamethoxazole-trimethoprim (SEPTRA DS) 800-160 MG per tablet Take 1 tablet by  mouth every 12 (twelve) hours. Patient not taking: Reported on 10/05/2015 08/23/13   Baron Sane, PA-C   BP 139/85 mmHg  Pulse 85  Temp(Src) 97.7 F (36.5 C) (Oral)  Resp 12  SpO2 95%  LMP 10/19/2015 (Exact Date) Physical Exam  Constitutional: She is oriented to person, place, and time. She appears well-developed and well-nourished.  HENT:  Head: Normocephalic.  Eyes: Pupils are equal, round, and reactive to light.  Neck: Normal range of motion.  Cardiovascular: Normal rate and regular rhythm.   Pulmonary/Chest: Effort normal and breath sounds normal.  Abdominal: Soft. She exhibits no distension. There is no tenderness.  Musculoskeletal: Normal range of motion.  Neurological: She is alert and oriented to person, place, and time.  Skin: There is pallor.  Nursing note and vitals reviewed.   ED Course  Procedures (including critical care time) Labs Review Labs Reviewed  COMPREHENSIVE METABOLIC PANEL - Abnormal; Notable for the following:    Chloride 99 (*)    Glucose, Bld 367 (*)    All other components within normal limits  CBC - Abnormal; Notable for the following:    RBC 3.74 (*)    Hemoglobin 11.5 (*)    All other components within normal limits  PH, GASTRIC FLUID (GASTROCCULT CARD)  POC OCCULT BLOOD, ED  TYPE AND SCREEN  ABO/RH    Imaging Review Dg Chest 2 View  10/31/2015  CLINICAL DATA:  49 year old female with hemoptysis and shortness of breath EXAM: CHEST  2 VIEW COMPARISON:  Radiograph dated 10/05/2015 FINDINGS: The heart size and mediastinal contours are within normal limits. Both lungs are clear. The visualized skeletal structures are unremarkable. IMPRESSION: No active cardiopulmonary disease.  No interval change. Electronically Signed   By: Anner Crete M.D.   On: 10/31/2015 03:10   I have personally reviewed and evaluated these images and lab results as part of my medical decision-making.   EKG Interpretation None     Patient was give  Morphine with little relief of her pain But after being given Carafate pain is gone will DC home with same and give GI referral Reassuring since she just had cardiac evaluation and ruled out for cardiac origin for her recurrent chest pain  MDM   Final diagnoses:  Hemoptysis  Acute epigastric pain         Junius Creamer, NP 10/31/15 [id number]  Merryl Hacker, MD 11/02/15 2302

## 2015-10-31 NOTE — ED Notes (Signed)
Pt nauseated, dry heaves of sputum and blood speck into emesis bag. Family at bedside.  NP aware.

## 2015-10-31 NOTE — ED Notes (Signed)
Patient reports vomiting blood x4 episodes since 0130 this am. Patient denies any prior history of the same. Patient denies blood thinners. Patient states the blood is dark red.

## 2015-11-14 ENCOUNTER — Encounter: Payer: Self-pay | Admitting: Gastroenterology

## 2016-01-19 ENCOUNTER — Ambulatory Visit: Payer: 59 | Admitting: Gastroenterology

## 2016-03-15 ENCOUNTER — Emergency Department (HOSPITAL_COMMUNITY): Payer: Self-pay

## 2016-03-15 ENCOUNTER — Encounter (HOSPITAL_COMMUNITY): Payer: Self-pay

## 2016-03-15 ENCOUNTER — Emergency Department (HOSPITAL_COMMUNITY)
Admission: EM | Admit: 2016-03-15 | Discharge: 2016-03-15 | Disposition: A | Discharge status: Home / Self Care | Payer: Self-pay | Attending: Emergency Medicine | Admitting: Emergency Medicine

## 2016-03-15 DIAGNOSIS — I10 Essential (primary) hypertension: Secondary | ICD-10-CM | POA: Insufficient documentation

## 2016-03-15 DIAGNOSIS — E119 Type 2 diabetes mellitus without complications: Secondary | ICD-10-CM | POA: Insufficient documentation

## 2016-03-15 DIAGNOSIS — Z7982 Long term (current) use of aspirin: Secondary | ICD-10-CM | POA: Insufficient documentation

## 2016-03-15 DIAGNOSIS — E785 Hyperlipidemia, unspecified: Secondary | ICD-10-CM | POA: Insufficient documentation

## 2016-03-15 DIAGNOSIS — Z7984 Long term (current) use of oral hypoglycemic drugs: Secondary | ICD-10-CM | POA: Insufficient documentation

## 2016-03-15 DIAGNOSIS — Z794 Long term (current) use of insulin: Secondary | ICD-10-CM | POA: Insufficient documentation

## 2016-03-15 DIAGNOSIS — Z79899 Other long term (current) drug therapy: Secondary | ICD-10-CM | POA: Insufficient documentation

## 2016-03-15 LAB — COMPREHENSIVE METABOLIC PANEL
ALK PHOS: 84 U/L (ref 38–126)
ALT: 16 U/L (ref 14–54)
AST: 13 U/L — AB (ref 15–41)
Albumin: 3.7 g/dL (ref 3.5–5.0)
Anion gap: 7 (ref 5–15)
BILIRUBIN TOTAL: 0.2 mg/dL — AB (ref 0.3–1.2)
BUN: 11 mg/dL (ref 6–20)
CALCIUM: 9 mg/dL (ref 8.9–10.3)
CO2: 26 mmol/L (ref 22–32)
CREATININE: 0.6 mg/dL (ref 0.44–1.00)
Chloride: 103 mmol/L (ref 101–111)
Glucose, Bld: 346 mg/dL — ABNORMAL HIGH (ref 65–99)
Potassium: 3.9 mmol/L (ref 3.5–5.1)
Sodium: 136 mmol/L (ref 135–145)
TOTAL PROTEIN: 8.1 g/dL (ref 6.5–8.1)

## 2016-03-15 LAB — URINALYSIS, ROUTINE W REFLEX MICROSCOPIC
Bilirubin Urine: NEGATIVE
Hgb urine dipstick: NEGATIVE
KETONES UR: NEGATIVE mg/dL
Leukocytes, UA: NEGATIVE
Nitrite: NEGATIVE
PROTEIN: NEGATIVE mg/dL
Specific Gravity, Urine: 1.038 — ABNORMAL HIGH (ref 1.005–1.030)
pH: 6.5 (ref 5.0–8.0)

## 2016-03-15 LAB — CBC
HCT: 40.6 % (ref 36.0–46.0)
Hemoglobin: 13.2 g/dL (ref 12.0–15.0)
MCH: 30.7 pg (ref 26.0–34.0)
MCHC: 32.5 g/dL (ref 30.0–36.0)
MCV: 94.4 fL (ref 78.0–100.0)
PLATELETS: 396 10*3/uL (ref 150–400)
RBC: 4.3 MIL/uL (ref 3.87–5.11)
RDW: 13.2 % (ref 11.5–15.5)
WBC: 8.2 10*3/uL (ref 4.0–10.5)

## 2016-03-15 LAB — URINE MICROSCOPIC-ADD ON: RBC / HPF: NONE SEEN RBC/hpf (ref 0–5)

## 2016-03-15 LAB — CBG MONITORING, ED
GLUCOSE-CAPILLARY: 320 mg/dL — AB (ref 65–99)
GLUCOSE-CAPILLARY: 339 mg/dL — AB (ref 65–99)
Glucose-Capillary: 241 mg/dL — ABNORMAL HIGH (ref 65–99)

## 2016-03-15 MED ORDER — INSULIN DETEMIR 100 UNIT/ML ~~LOC~~ SOLN: 40.0000 [IU] | Freq: Every day | SUBCUTANEOUS | Status: DC

## 2016-03-15 MED ORDER — MECLIZINE HCL 25 MG PO TABS
25.0000 mg | ORAL_TABLET | Freq: Once | ORAL | Status: AC
  Administered 2016-03-15: 25 mg via ORAL
  Filled 2016-03-15: qty 1

## 2016-03-15 MED ORDER — LORAZEPAM 2 MG/ML IJ SOLN
1.0000 mg | Freq: Once | INTRAMUSCULAR | Status: AC
  Administered 2016-03-15: 1 mg via INTRAVENOUS
  Filled 2016-03-15: qty 1

## 2016-03-15 MED ORDER — HYDROCHLOROTHIAZIDE 25 MG PO TABS: 25.0000 mg | ORAL_TABLET | Freq: Every day | ORAL | Status: DC

## 2016-03-15 MED ORDER — LISINOPRIL 20 MG PO TABS: 20.0000 mg | ORAL_TABLET | Freq: Every day | ORAL | Status: DC

## 2016-03-15 MED ORDER — SODIUM CHLORIDE 0.9 % IV BOLUS (SEPSIS)
1000.0000 mL | Freq: Once | INTRAVENOUS | Status: AC
  Administered 2016-03-15: 1000 mL via INTRAVENOUS

## 2016-03-15 MED ORDER — METFORMIN HCL 500 MG PO TABS: 500.0000 mg | ORAL_TABLET | Freq: Two times a day (BID) | ORAL | Status: DC

## 2016-03-15 NOTE — ED Notes (Signed)
Patient transported to CT 

## 2016-03-15 NOTE — ED Notes (Signed)
Pt ambulated to BR with steady gait. Family at bedside.

## 2016-03-15 NOTE — Progress Notes (Signed)
Dear ___________[name]______________:  Cindy [name] have been approved to have the prescriptions written by your discharging physician filled through our [facility] (Medication Assistance Through [facility]) program. This program allows for a one-time (no refills) 34-day supply of selected medications for a low copay amount.  The copay is $3.00 per prescription. For instance, if you have one prescription, you will pay $3.00; for two prescriptions, you pay $6.00; for three prescriptions, you pay $9.00; and so on.  Only certain pharmacies are participating in this program with [facility]. You will need to select one of the pharmacies from the attached list and take your prescriptions, this letter, and your photo ID to one of the participating pharmacies.   We are excited that you are able to use the [facility] program to get your medications. These prescriptions must be filled within 7 days of hospital discharge or they will no longer be valid for the [facility] program. Should you have any problems with your prescriptions please contact your case management team member at [phone].  Thank you,   [place], [address] [place], [place], [place], [place], [address] [employer] [facility], [employer], [place] and [employer], [place], [place], [place] [address]  Other [place], [address] [employer], [place], [place], [place], [place], [place], [place], [place], [place], [place]  [facility] [address], [place], [place] [address] Maria Stein, [address] [place], [place], [place] [address], [place], [place], [place], [place], [place], [place] [address], [place], [place] [address], [place], [place], [place], [place], [place], [place] [address] [place], [place]       [place] [address] [place], [place], [place] [employer], [place], [place] [address] [place], [place], [place] [employer], [place], [place], [place], [place]                                    [address], [place], [place], [place], [place] [id number] [place], [place], [place]  [address] [place], [place], [place], [place], [place], [place], [place], [place], [place] [address] [place], [place], [place], [place], [place], [place], [address] [place], [place], [place] [address] [place], [place]                                    [place], [place], [place], [place], [place], [place], [place], [place], [place], [place], [place], [place], [place], [place] [address], [address], [place] [address] [place], [place] [address] [place], [place], [place], [place], [place], [place], [place], [place] [address] [place], [place], [place], [place] [address], [place], [place], [place], [place]  [address] [address], [place], [place], [place], [place], [place], [place]    [place], [place], [place], [place], [place] [address], [place], [place]  [address], [place], [place] [address], [place], [place] [address] [place], [place] [address], [place], [place] [employer], [place], [place]    [place], [place], [place], [place], [place]                  [place], [place], [place]

## 2016-03-15 NOTE — Discharge Instructions (Signed)
Hypertension Hypertension, commonly called high blood pressure, is when the force of blood pumping through your arteries is too strong. Your arteries are the blood vessels that carry blood from your heart throughout your body. A blood pressure reading consists of a higher number over a lower number, such as 110/72. The higher number (systolic) is the pressure inside your arteries when your heart pumps. The lower number (diastolic) is the pressure inside your arteries when your heart relaxes. Ideally you want your blood pressure below 120/80. Hypertension forces your heart to work harder to pump blood. Your arteries may become narrow or stiff. Having untreated or uncontrolled hypertension can cause heart attack, stroke, kidney disease, and other problems. RISK FACTORS Some risk factors for high blood pressure are controllable. Others are not.  Risk factors you cannot control include:   Race. You may be at higher risk if you are African American.  Age. Risk increases with age.  Gender. Men are at higher risk than women before age 45 years. After age 65, women are at higher risk than men. Risk factors you can control include:  Not getting enough exercise or physical activity.  Being overweight.  Getting too much fat, sugar, calories, or salt in your diet.  Drinking too much alcohol. SIGNS AND SYMPTOMS Hypertension does not usually cause signs or symptoms. Extremely high blood pressure (hypertensive crisis) may cause headache, anxiety, shortness of breath, and nosebleed. DIAGNOSIS To check if you have hypertension, your health care provider will measure your blood pressure while you are seated, with your arm held at the level of your heart. It should be measured at least twice using the same arm. Certain conditions can cause a difference in blood pressure between your right and left arms. A blood pressure reading that is higher than normal on one occasion does not mean that you need treatment. If  it is not clear whether you have high blood pressure, you may be asked to return on a different day to have your blood pressure checked again. Or, you may be asked to monitor your blood pressure at home for 1 or more weeks. TREATMENT Treating high blood pressure includes making lifestyle changes and possibly taking medicine. Living a healthy lifestyle can help lower high blood pressure. You may need to change some of your habits. Lifestyle changes may include:  Following the DASH diet. This diet is high in fruits, vegetables, and whole grains. It is low in salt, red meat, and added sugars.  Keep your sodium intake below 2,300 mg per day.  Getting at least 30-45 minutes of aerobic exercise at least 4 times per week.  Losing weight if necessary.  Not smoking.  Limiting alcoholic beverages.  Learning ways to reduce stress. Your health care provider may prescribe medicine if lifestyle changes are not enough to get your blood pressure under control, and if one of the following is true:  You are 18-59 years of age and your systolic blood pressure is above 140.  You are 60 years of age or older, and your systolic blood pressure is above 150.  Your diastolic blood pressure is above 90.  You have diabetes, and your systolic blood pressure is over 140 or your diastolic blood pressure is over 90.  You have kidney disease and your blood pressure is above 140/90.  You have heart disease and your blood pressure is above 140/90. Your personal target blood pressure may vary depending on your medical conditions, your age, and other factors. HOME CARE INSTRUCTIONS    Have your blood pressure rechecked as directed by your health care provider.   Take medicines only as directed by your health care provider. Follow the directions carefully. Blood pressure medicines must be taken as prescribed. The medicine does not work as well when you skip doses. Skipping doses also puts you at risk for  problems.  Do not smoke.   Monitor your blood pressure at home as directed by your health care provider. SEEK MEDICAL CARE IF:   You think you are having a reaction to medicines taken.  You have recurrent headaches or feel dizzy.  You have swelling in your ankles.  You have trouble with your vision. SEEK IMMEDIATE MEDICAL CARE IF:  You develop a severe headache or confusion.  You have unusual weakness, numbness, or feel faint.  You have severe chest or abdominal pain.  You vomit repeatedly.  You have trouble breathing. MAKE SURE YOU:   Understand these instructions.  Will watch your condition.  Will get help right away if you are not doing well or get worse.   This information is not intended to replace advice given to you by your health care provider. Make sure you discuss any questions you have with your health care provider.   Document Released: 11/15/2005 Document Revised: 04/01/2015 Document Reviewed: 09/07/2013 Elsevier Interactive Patient Education 2016 Elsevier Inc. DASH Eating Plan DASH stands for "Dietary Approaches to Stop Hypertension." The DASH eating plan is a healthy eating plan that has been shown to reduce high blood pressure (hypertension). Additional health benefits may include reducing the risk of type 2 diabetes mellitus, heart disease, and stroke. The DASH eating plan may also help with weight loss. WHAT DO I NEED TO KNOW ABOUT THE DASH EATING PLAN? For the DASH eating plan, you will follow these general guidelines:  Choose foods with a percent daily value for sodium of less than 5% (as listed on the food label).  Use salt-free seasonings or herbs instead of table salt or sea salt.  Check with your health care provider or pharmacist before using salt substitutes.  Eat lower-sodium products, often labeled as "lower sodium" or "no salt added."  Eat fresh foods.  Eat more vegetables, fruits, and low-fat dairy products.  Choose whole grains.  Look for the word "whole" as the first word in the ingredient list.  Choose fish and skinless chicken or turkey more often than red meat. Limit fish, poultry, and meat to 6 oz (170 g) each day.  Limit sweets, desserts, sugars, and sugary drinks.  Choose heart-healthy fats.  Limit cheese to 1 oz (28 g) per day.  Eat more home-cooked food and less restaurant, buffet, and fast food.  Limit fried foods.  Cook foods using methods other than frying.  Limit canned vegetables. If you do use them, rinse them well to decrease the sodium.  When eating at a restaurant, ask that your food be prepared with less salt, or no salt if possible. WHAT FOODS CAN I EAT? Seek help from a dietitian for individual calorie needs. Grains Whole grain or whole wheat bread. Brown rice. Whole grain or whole wheat pasta. Quinoa, bulgur, and whole grain cereals. Low-sodium cereals. Corn or whole wheat flour tortillas. Whole grain cornbread. Whole grain crackers. Low-sodium crackers. Vegetables Fresh or frozen vegetables (raw, steamed, roasted, or grilled). Low-sodium or reduced-sodium tomato and vegetable juices. Low-sodium or reduced-sodium tomato sauce and paste. Low-sodium or reduced-sodium canned vegetables.  Fruits All fresh, canned (in natural juice), or frozen fruits. Meat and Other   Protein Products Ground beef (85% or leaner), grass-fed beef, or beef trimmed of fat. Skinless chicken or turkey. Ground chicken or turkey. Pork trimmed of fat. All fish and seafood. Eggs. Dried beans, peas, or lentils. Unsalted nuts and seeds. Unsalted canned beans. Dairy Low-fat dairy products, such as skim or 1% milk, 2% or reduced-fat cheeses, low-fat ricotta or cottage cheese, or plain low-fat yogurt. Low-sodium or reduced-sodium cheeses. Fats and Oils Tub margarines without trans fats. Light or reduced-fat mayonnaise and salad dressings (reduced sodium). Avocado. Safflower, olive, or canola oils. Natural peanut or almond  butter. Other Unsalted popcorn and pretzels. The items listed above may not be a complete list of recommended foods or beverages. Contact your dietitian for more options. WHAT FOODS ARE NOT RECOMMENDED? Grains White bread. White pasta. White rice. Refined cornbread. Bagels and croissants. Crackers that contain trans fat. Vegetables Creamed or fried vegetables. Vegetables in a cheese sauce. Regular canned vegetables. Regular canned tomato sauce and paste. Regular tomato and vegetable juices. Fruits Dried fruits. Canned fruit in light or heavy syrup. Fruit juice. Meat and Other Protein Products Fatty cuts of meat. Ribs, chicken wings, bacon, sausage, bologna, salami, chitterlings, fatback, hot dogs, bratwurst, and packaged luncheon meats. Salted nuts and seeds. Canned beans with salt. Dairy Whole or 2% milk, cream, half-and-half, and cream cheese. Whole-fat or sweetened yogurt. Full-fat cheeses or blue cheese. Nondairy creamers and whipped toppings. Processed cheese, cheese spreads, or cheese curds. Condiments Onion and garlic salt, seasoned salt, table salt, and sea salt. Canned and packaged gravies. Worcestershire sauce. Tartar sauce. Barbecue sauce. Teriyaki sauce. Soy sauce, including reduced sodium. Steak sauce. Fish sauce. Oyster sauce. Cocktail sauce. Horseradish. Ketchup and mustard. Meat flavorings and tenderizers. Bouillon cubes. Hot sauce. Tabasco sauce. Marinades. Taco seasonings. Relishes. Fats and Oils Butter, stick margarine, lard, shortening, ghee, and bacon fat. Coconut, palm kernel, or palm oils. Regular salad dressings. Other Pickles and olives. Salted popcorn and pretzels. The items listed above may not be a complete list of foods and beverages to avoid. Contact your dietitian for more information. WHERE CAN I FIND MORE INFORMATION? National Heart, Lung, and Blood Institute: www.nhlbi.nih.gov/health/health-topics/topics/dash/   This information is not intended to replace  advice given to you by your health care provider. Make sure you discuss any questions you have with your health care provider.   Document Released: 11/04/2011 Document Revised: 12/06/2014 Document Reviewed: 09/19/2013 Elsevier Interactive Patient Education 2016 Elsevier Inc.  

## 2016-03-15 NOTE — ED Notes (Signed)
Awake. Verbally responsive. A/O x4. Resp even and unlabored. No audible adventitious breath sounds noted. ABC's intact.  

## 2016-03-15 NOTE — ED Provider Notes (Addendum)
CSN: [id number]     Arrival date & time 03/15/16  [id number] History   First MD Initiated Contact with Patient 03/15/16 0919     Chief Complaint  Patient presents with  . Dizziness  . Emesis      HPI Pt c/o intermittent dizziness, headache and n/v x "a couple weeks." Pain score 7/10. Pt reports taking Tylenol w/ relief. Pt cannot state precipitating factors. Hx of HTN and DM. Pt has been out of all medications x 1 week.  Past Medical History  Diagnosis Date  . Diabetes mellitus   . Hypertension   . Hyperlipemia    History reviewed. No pertinent past surgical history. History reviewed. No pertinent family history. Social History  Substance Use Topics  . Smoking status: Never Smoker   . Smokeless tobacco: None  . Alcohol Use: No   OB History    No data available     Review of Systems  All other systems reviewed and are negative.     Allergies  Other  Home Medications   Prior to Admission medications   Medication Sig Start Date End Date Taking? Authorizing Provider  acetaminophen (TYLENOL) 500 MG tablet Take 1,500 mg by mouth every 6 (six) hours as needed for mild pain.   Yes Historical Provider, MD  aspirin 325 MG tablet Take 325 mg by mouth daily.   Yes Historical Provider, MD  Chlorpheniramine-Acetaminophen (CORICIDIN HBP COLD/FLU PO) Take 2 tablets by mouth daily as needed (cold symptoms).   Yes Historical Provider, MD  ibuprofen (ADVIL,MOTRIN) 200 MG tablet Take 800 mg by mouth every 6 (six) hours as needed for moderate pain.   Yes Historical Provider, MD  lovastatin (MEVACOR) 20 MG tablet Take 20 mg by mouth at bedtime.   Yes Historical Provider, MD  hydrochlorothiazide (HYDRODIURIL) 25 MG tablet Take 1 tablet (25 mg total) by mouth daily. 03/15/16   Leonard Schwartz, MD  insulin detemir (LEVEMIR) 100 UNIT/ML injection Inject 0.4 mLs (40 Units total) into the skin at bedtime. 03/15/16   Leonard Schwartz, MD  lisinopril (PRINIVIL,ZESTRIL) 20 MG tablet Take 1 tablet (20  mg total) by mouth daily. 03/15/16   Leonard Schwartz, MD  metFORMIN (GLUCOPHAGE) 500 MG tablet Take 1 tablet (500 mg total) by mouth 2 (two) times daily with a meal. 03/15/16   Leonard Schwartz, MD  sucralfate (CARAFATE) 1 GM/10ML suspension Take 10 mLs (1 g total) by mouth 4 (four) times daily -  with meals and at bedtime. 10/31/15   Junius Creamer, NP   BP 157/87 mmHg  Pulse 76  Temp(Src) 98.7 F (37.1 C) (Oral)  Resp 18  SpO2 97%  LMP 12/15/2015 Physical Exam  Constitutional: She is oriented to person, place, and time. She appears well-developed and well-nourished. No distress.  HENT:  Head: Normocephalic and atraumatic.  Eyes: Pupils are equal, round, and reactive to light.  Neck: Normal range of motion.  Cardiovascular: Normal rate and intact distal pulses.   Pulmonary/Chest: No respiratory distress.  Abdominal: Normal appearance. She exhibits no distension.  Musculoskeletal: Normal range of motion.  Neurological: She is alert and oriented to person, place, and time. No cranial nerve deficit.  Skin: Skin is warm and dry. No rash noted.  Psychiatric: She has a normal mood and affect. Her behavior is normal.  Nursing note and vitals reviewed.   ED Course  Procedures (including critical care time) Medications  sodium chloride 0.9 % bolus 1,000 mL (1,000 mLs Intravenous New Bag/Given 03/15/16 1329)  LORazepam (ATIVAN) injection  1 mg (1 mg Intravenous Given 03/15/16 1033)  meclizine (ANTIVERT) tablet 25 mg (25 mg Oral Given 03/15/16 0947)    Labs Review Labs Reviewed  COMPREHENSIVE METABOLIC PANEL - Abnormal; Notable for the following:    Glucose, Bld 346 (*)    AST 13 (*)    Total Bilirubin 0.2 (*)    All other components within normal limits  URINALYSIS, ROUTINE W REFLEX MICROSCOPIC (NOT AT [facility]) - Abnormal; Notable for the following:    Specific Gravity, Urine 1.038 (*)    Glucose, UA >1000 (*)    All other components within normal limits  URINE MICROSCOPIC-ADD ON - Abnormal;  Notable for the following:    Squamous Epithelial / LPF 0-5 (*)    Bacteria, UA RARE (*)    All other components within normal limits  CBG MONITORING, ED - Abnormal; Notable for the following:    Glucose-Capillary 339 (*)    All other components within normal limits  CBG MONITORING, ED - Abnormal; Notable for the following:    Glucose-Capillary 320 (*)    All other components within normal limits  CBC    Imaging Review Ct Head Wo Contrast  03/15/2016  CLINICAL DATA:  Dizziness, headache, nausea, vomiting EXAM: CT HEAD WITHOUT CONTRAST TECHNIQUE: Contiguous axial images were obtained from the base of the skull through the vertex without intravenous contrast. COMPARISON:  None. FINDINGS: There is no evidence of mass effect, midline shift or extra-axial fluid collections. There is no evidence of a space-occupying lesion or intracranial hemorrhage. There is no evidence of a cortical-based area of acute infarction. The ventricles and sulci are appropriate for the patient's age. The basal cisterns are patent. Visualized portions of the orbits are unremarkable. The visualized portions of the paranasal sinuses and mastoid air cells are unremarkable. The osseous structures are unremarkable. IMPRESSION: Normal CT of the brain without intravenous contrast. Electronically Signed   By: Kathreen Devoid   On: 03/15/2016 12:04   I have personally reviewed and evaluated these images and lab results as part of my medical decision-making.    MDM   Final diagnoses:  Essential hypertension        Leonard Schwartz, MD 03/15/16 1359  Leonard Schwartz, MD 03/15/16 1401

## 2016-03-15 NOTE — Progress Notes (Signed)
Cm consulted by ED RN about assist for pt without insurance and needing to get her medications CM reviewed PDMI Pt has not used services Cm spoke with pt and 2 visitors at bedside (female and female) Pt informs Cm she has been going to urgent care to get her medications, medical care and prescriptions when she had insurance but now does not have insurance Cm discussed with pt the available uninsured resources for medications, pcps  CM spoke with pt who confirms uninsured [employer] resident with no pcp.  CM discussed and provided written information for uninsured accepting pcps, discussed the importance of pcp vs EDP services for f/u care, www.needymeds.org, www.goodrx.com, discounted pharmacies and other [employer] such as [employer], affordable care act, financial assistance, uninsured dental services, [place] med assist, DSS and  health department  Reviewed resources for [employer] uninsured accepting pcps like [facility], family medicine at [employer], [facility], [facility], local urgent care centers, [facility], [facility] family practice, general medical clinics, family services of the [place], [facility] urgent care plus others, medication resources, [facility] out patient pharmacies and housing Pt voiced understanding and appreciation of resources provided   Provided [facility] contact information  Pt given copies of goodrx cost sheets for hydroduril and lisinopril Cm encouraged use of needymeds.org, goodrx.com and [place] medassist Encouraged to go to urgent care or uninsured pcp of choice to get her prescriptions and to use resources to get best discount for medications as needed

## 2016-03-15 NOTE — ED Notes (Addendum)
Pt c/o intermittent dizziness, headache and n/v x "a couple weeks." Pain score 7/10.  Pt reports taking Tylenol w/ relief.  Pt cannot state precipitating factors.  Hx of HTN and DM.  Pt has been out of all medications x 1 week.

## 2016-03-15 NOTE — Progress Notes (Signed)
Spoke with ED RN to check which Rx written for d/c  Noted pt on levemir  CM consulted with EDP for d/c Rx (s) - Dr Audie Pinto states Metformin, lisinopril, HCTZ and levemir    CM reviewed EPIC notes and chart review information CM spoke with the pt about [facility] MATCH program ($3 co pay for each Rx through [facility] program, does not include refills, 7 day expiration of [place] letter and choice of pharmacies) Pt agreed to receive assistance from program only once    Pt is eligible for [facility] MATCH program (unable to find pt listed in PDMI per cardholder name inquiry)  PDMI information entered. [place] letter completed and provided to pt.   CM updated EDP and ED RN

## 2016-03-17 ENCOUNTER — Telehealth: Payer: Self-pay | Admitting: *Deleted

## 2016-10-21 ENCOUNTER — Emergency Department
Admission: EM | Admit: 2016-10-21 | Discharge: 2016-10-21 | Disposition: A | Discharge status: Home / Self Care | Payer: Commercial Managed Care - PPO | Attending: Emergency Medicine | Admitting: Emergency Medicine

## 2016-10-21 DIAGNOSIS — E1165 Type 2 diabetes mellitus with hyperglycemia: Secondary | ICD-10-CM | POA: Insufficient documentation

## 2016-10-21 DIAGNOSIS — R51 Headache: Secondary | ICD-10-CM

## 2016-10-21 DIAGNOSIS — R1010 Upper abdominal pain, unspecified: Secondary | ICD-10-CM | POA: Insufficient documentation

## 2016-10-21 DIAGNOSIS — R519 Headache, unspecified: Secondary | ICD-10-CM

## 2016-10-21 DIAGNOSIS — I10 Essential (primary) hypertension: Secondary | ICD-10-CM | POA: Insufficient documentation

## 2016-10-21 DIAGNOSIS — R739 Hyperglycemia, unspecified: Secondary | ICD-10-CM

## 2016-10-21 DIAGNOSIS — G8929 Other chronic pain: Secondary | ICD-10-CM | POA: Insufficient documentation

## 2016-10-21 DIAGNOSIS — Z794 Long term (current) use of insulin: Secondary | ICD-10-CM | POA: Insufficient documentation

## 2016-10-21 LAB — COMPREHENSIVE METABOLIC PANEL
ALBUMIN: 3.5 g/dL (ref 3.5–5.0)
ALK PHOS: 96 U/L (ref 38–126)
ALT: 15 U/L (ref 14–54)
AST: 18 U/L (ref 15–41)
Anion gap: 10 (ref 5–15)
BILIRUBIN TOTAL: 0.4 mg/dL (ref 0.3–1.2)
BUN: 12 mg/dL (ref 6–20)
CALCIUM: 8.9 mg/dL (ref 8.9–10.3)
CO2: 25 mmol/L (ref 22–32)
CREATININE: 0.74 mg/dL (ref 0.44–1.00)
Chloride: 99 mmol/L — ABNORMAL LOW (ref 101–111)
GFR calc Af Amer: >60 mL/min (ref >60)
GLUCOSE: 432 mg/dL — AB (ref 65–99)
POTASSIUM: 3.7 mmol/L (ref 3.5–5.1)
Sodium: 134 mmol/L — ABNORMAL LOW (ref 135–145)
TOTAL PROTEIN: 7.9 g/dL (ref 6.5–8.1)

## 2016-10-21 LAB — URINALYSIS COMPLETE WITH MICROSCOPIC (ARMC ONLY)
BACTERIA UA: NONE SEEN
Bilirubin Urine: NEGATIVE
Hgb urine dipstick: NEGATIVE
Ketones, ur: NEGATIVE mg/dL
Leukocytes, UA: NEGATIVE
NITRITE: NEGATIVE
Protein, ur: NEGATIVE mg/dL
RBC / HPF: NONE SEEN RBC/hpf (ref 0–5)
SPECIFIC GRAVITY, URINE: 1.024 (ref 1.005–1.030)
WBC, UA: NONE SEEN WBC/hpf (ref 0–5)
pH: 5 (ref 5.0–8.0)

## 2016-10-21 LAB — CBC
HEMATOCRIT: 39.6 % (ref 35.0–47.0)
Hemoglobin: 13.2 g/dL (ref 12.0–16.0)
MCH: 31.9 pg (ref 26.0–34.0)
MCHC: 33.4 g/dL (ref 32.0–36.0)
MCV: 95.3 fL (ref 80.0–100.0)
PLATELETS: 320 10*3/uL (ref 150–440)
RBC: 4.15 MIL/uL (ref 3.80–5.20)
RDW: 13.4 % (ref 11.5–14.5)
WBC: 8.2 10*3/uL (ref 3.6–11.0)

## 2016-10-21 LAB — GLUCOSE, CAPILLARY
GLUCOSE-CAPILLARY: 443 mg/dL — AB (ref 65–99)
GLUCOSE-CAPILLARY: 289 mg/dL — AB (ref 65–99)
Glucose-Capillary: 247 mg/dL — ABNORMAL HIGH (ref 65–99)

## 2016-10-21 LAB — LIPASE, BLOOD: Lipase: 36 U/L (ref 11–51)

## 2016-10-21 MED ORDER — INSULIN ASPART 100 UNIT/ML ~~LOC~~ SOLN
6.0000 [IU] | Freq: Once | SUBCUTANEOUS | Status: AC
  Administered 2016-10-21: 6 [IU] via INTRAVENOUS
  Filled 2016-10-21: qty 6

## 2016-10-21 MED ORDER — SODIUM CHLORIDE 0.9 % IV BOLUS (SEPSIS)
1000.0000 mL | Freq: Once | INTRAVENOUS | Status: AC
Start: 2016-10-21 — End: 2016-10-21
  Administered 2016-10-21: 1000 mL via INTRAVENOUS

## 2016-10-21 MED ORDER — DIPHENHYDRAMINE HCL 50 MG/ML IJ SOLN
50.0000 mg | Freq: Once | INTRAMUSCULAR | Status: AC
  Administered 2016-10-21: 50 mg via INTRAVENOUS
  Filled 2016-10-21: qty 1

## 2016-10-21 MED ORDER — SODIUM CHLORIDE 0.9 % IV BOLUS (SEPSIS)
1000.0000 mL | Freq: Once | INTRAVENOUS | Status: AC
  Administered 2016-10-21: 1000 mL via INTRAVENOUS

## 2016-10-21 MED ORDER — KETOROLAC TROMETHAMINE 30 MG/ML IJ SOLN
30.0000 mg | Freq: Once | INTRAMUSCULAR | Status: AC
  Administered 2016-10-21: 30 mg via INTRAVENOUS
  Filled 2016-10-21: qty 1

## 2016-10-21 MED ORDER — METOCLOPRAMIDE HCL 5 MG/ML IJ SOLN
10.0000 mg | Freq: Once | INTRAMUSCULAR | Status: AC
  Administered 2016-10-21: 10 mg via INTRAVENOUS
  Filled 2016-10-21: qty 2

## 2016-10-21 NOTE — ED Triage Notes (Signed)
Pt here for headache over right eye.  Reports feeling weak all over. Speech is clear.  Also reports abdominal pain that has been ongoing for weeks.  She is schedule to see her MD tomorrow for ABD pain.

## 2016-10-21 NOTE — ED Provider Notes (Signed)
[facility] Emergency Department Provider Note  Time seen: 3:10 PM  I have reviewed the triage vital signs and the nursing notes.   HISTORY  Chief Complaint Headache and Abdominal Pain    HPI Cindy [name] is a 50 y.o. female with a past medical history of diabetes, hypertension, hyperlipidemia who presents to the emergency department for headache and abdominal pain. According to the patient for the past 3 months she has been having upper abdominal pain worse when eating. Just a states around noon today she developed a headache which has progressively worsened since then. Describes photophobia and phonophobia, denies nausea. States a feeling of generalized weakness but denies any focal deficits. Denies any fever or neck pain. As a secondary complaint the patient states she has been urinating very frequently, and has been experiencing dry mouth for several weeks. Patient's blood glucose in the emergency department 440. Patient states a history of diabetes with blood glucose is ranging in the 200-400 range at home.Patient states chronic upper abdominal pain. Has appointment with her primary care doctor tomorrow morning for the same. Patient states a history of headaches although not officially diagnosed with migraines. States she has required going to ERs several times in the past for headaches as well.  Past Medical History:  Diagnosis Date  . Diabetes mellitus   . Hyperlipemia   . Hypertension     Patient Active Problem List   Diagnosis Date Noted  . OBESITY 07/01/2010  . Type II or unspecified type diabetes mellitus without mention of complication, not stated as uncontrolled 05/08/2008  . HYPERLIPIDEMIA 05/08/2008  . HYPERTENSION 05/08/2008    No past surgical history on file.  Prior to Admission medications   Medication Sig Start Date End Date Taking? Authorizing Provider  acetaminophen (TYLENOL) 500 MG tablet Take 1,500 mg by mouth every 6 (six)  hours as needed for mild pain.    Historical Provider, MD  aspirin 325 MG tablet Take 325 mg by mouth daily.    Historical Provider, MD  Chlorpheniramine-Acetaminophen (CORICIDIN HBP COLD/FLU PO) Take 2 tablets by mouth daily as needed (cold symptoms).    Historical Provider, MD  hydrochlorothiazide (HYDRODIURIL) 25 MG tablet Take 1 tablet (25 mg total) by mouth daily. 03/15/16   Leonard Schwartz, MD  ibuprofen (ADVIL,MOTRIN) 200 MG tablet Take 800 mg by mouth every 6 (six) hours as needed for moderate pain.    Historical Provider, MD  insulin detemir (LEVEMIR) 100 UNIT/ML injection Inject 0.4 mLs (40 Units total) into the skin at bedtime. 03/15/16   Leonard Schwartz, MD  lisinopril (PRINIVIL,ZESTRIL) 20 MG tablet Take 1 tablet (20 mg total) by mouth daily. 03/15/16   Leonard Schwartz, MD  lovastatin (MEVACOR) 20 MG tablet Take 20 mg by mouth at bedtime.    Historical Provider, MD  metFORMIN (GLUCOPHAGE) 500 MG tablet Take 1 tablet (500 mg total) by mouth 2 (two) times daily with a meal. 03/15/16   Leonard Schwartz, MD  sucralfate (CARAFATE) 1 GM/10ML suspension Take 10 mLs (1 g total) by mouth 4 (four) times daily -  with meals and at bedtime. 10/31/15   Junius Creamer, NP    Allergies  Allergen Reactions  . Other     Hives to an unknown medication given in 2012 at [facility]     No family history on file.  Social History Social History  Substance Use Topics  . Smoking status: Never Smoker  . Smokeless tobacco: Not on file  . Alcohol use No  Review of Systems Constitutional: Negative for fever. Cardiovascular: Negative for chest pain. Respiratory: Negative for shortness of breath. Gastrointestinal: Upper abdominal pain, ongoing for 2-3 months. Genitourinary: Negative for dysuria. Neurological: Moderate headache. Denies focal weakness or numbness. 10-point ROS otherwise negative.  ____________________________________________   PHYSICAL EXAM:  VITAL SIGNS: ED Triage Vitals  Enc Vitals Group      BP 10/21/16 1440 (!) 155/82     Pulse Rate 10/21/16 1440 89     Resp 10/21/16 1440 18     Temp 10/21/16 1440 97.6 F (36.4 C)     Temp Source 10/21/16 1440 Oral     SpO2 10/21/16 1440 96 %     Weight 10/21/16 1441 245 lb (111.1 kg)     Height 10/21/16 1441 5\' 3"  (1.6 m)     Head Circumference --      Peak Flow --      Pain Score 10/21/16 1441 10     Pain Loc --      Pain Edu? --      Excl. in [place]? --     Constitutional: Alert and oriented. Well appearing and in no distress. Eyes: Normal exam, Mild photophobia on exam. 3 mm PERRL ENT   Head: Normocephalic and atraumatic.   Mouth/Throat: Mucous membranes are moist. Cardiovascular: Normal rate, regular rhythm. No murmurs, rubs, or gallops. Respiratory: Normal respiratory effort without tachypnea nor retractions. Breath sounds are clear  Gastrointestinal: Soft and nontender. No distention.   Musculoskeletal: Nontender with normal range of motion in all extremities. No lower extremity tenderness Neurologic:  Normal speech and language. No gross focal neurologic deficits. Equal grip strengths bilaterally. No pronator drift. 5/5 motor in all extremities. Sensation intact. Cranial nerves intact. Skin:  Skin is warm, dry and intact.  Psychiatric: Mood and affect are normal.   ____________________________________________    INITIAL IMPRESSION / ASSESSMENT AND PLAN / ED COURSE  Pertinent labs & imaging results that were available during my care of the patient were reviewed by me and considered in my medical decision making (see chart for details).  The patient presents the emergency department for a headache, also is complaining of 3 months of upper abdominal pain, has an appointment with her PCP tomorrow for that. Patient has an intact and normal neurological exam. States a history of headaches in the past requiring ER visits. The patient appears very well on my exam. We will treat with Toradol, Reglan, Benadryl, IV hydrate.  Patient's blood glucose is significantly elevated will likely require insulin. Patient is complaining of frequent urination, we will send the urinalysis, however I suspect the patient's frequency is a result of her elevated blood glucose as well as her dry mouth. I do not believe the patient requires CT imaging at this time given her an intact neurological exam and overall well appearance.  Labs have resulted largely within normal limits. Patient's blood glucose has decreased to 247 with IV fluids and insulin. Patient states her headache is completely gone at this time. We will discharge the patient home with PCP follow-up. I discussed dietary cautions and strict blood glucose monitoring along with increasing her oral intake of non-sugary fluids. Patient agreeable to plan.  ____________________________________________   FINAL CLINICAL IMPRESSION(S) / ED DIAGNOSES  Hyperglycemia Upper abdominal pain, chronic Headache    Harvest Dark, MD 10/21/16 1717

## 2016-10-21 NOTE — ED Notes (Signed)
Pt reports severe headache since 12 noon with middle abd pain for the last 2 months - pt denies nausea/vomiting/diarrhea - Dr Kerman Passey in with pt evaluating her at this time

## 2016-10-29 ENCOUNTER — Encounter: Payer: Self-pay | Admitting: Gastroenterology

## 2016-12-16 ENCOUNTER — Ambulatory Visit: Payer: Self-pay | Admitting: Gastroenterology

## 2016-12-30 ENCOUNTER — Encounter: Payer: Self-pay | Admitting: Gastroenterology

## 2016-12-30 ENCOUNTER — Ambulatory Visit (INDEPENDENT_AMBULATORY_CARE_PROVIDER_SITE_OTHER): Payer: BLUE CROSS/BLUE SHIELD | Admitting: Gastroenterology

## 2016-12-30 VITALS — BP 128/76 | HR 96 | Ht 63.25 in | Wt 239.1 lb

## 2016-12-30 DIAGNOSIS — R1013 Epigastric pain: Secondary | ICD-10-CM | POA: Diagnosis not present

## 2016-12-30 DIAGNOSIS — K5900 Constipation, unspecified: Secondary | ICD-10-CM | POA: Diagnosis not present

## 2016-12-30 DIAGNOSIS — G8929 Other chronic pain: Secondary | ICD-10-CM

## 2016-12-30 DIAGNOSIS — R1032 Left lower quadrant pain: Secondary | ICD-10-CM | POA: Diagnosis not present

## 2016-12-30 MED ORDER — NA SULFATE-K SULFATE-MG SULF 17.5-3.13-1.6 GM/177ML PO SOLN: 1.0000 | Freq: Once | ORAL | 0 refills | Status: AC

## 2016-12-30 NOTE — Patient Instructions (Addendum)
If you are age 53 or older, your body mass index should be between 23-30. Your Body mass index is 42.02 kg/m. If this is out of the aforementioned range listed, please consider follow up with your Primary Care Provider.  If you are age 61 or younger, your body mass index should be between 19-25. Your Body mass index is 42.02 kg/m. If this is out of the aformentioned range listed, please consider follow up with your Primary Care Provider.   You have been scheduled for a EGD/colonoscopy. Please follow written instructions given to you at your visit today.  Please pick up your prep supplies at the pharmacy within the next 1-3 days. If you use inhalers (even only as needed), please bring them with you on the day of your procedure. Your physician has requested that you go to www.startemmi.com and enter the access code given to you at your visit today. This web site gives a general overview about your procedure. However, you should still follow specific instructions given to you by our office regarding your preparation for the procedure.  Thank you for choosing [facility]  Dr Wilfrid Lund III

## 2016-12-30 NOTE — Progress Notes (Signed)
[facility] Consult Note:  History: Cindy [name] 12/30/2016  Referring physician: [place]  Reason for consult/chief complaint: Abdominal Pain (lower region X 5-6 months.  )   Subjective  HPI:  This is a 51 year old woman referred by primary care at a local urgent care clinic. She saw them in late November after a 1 year absence from care due to lack of insurance. She was describing abdominal pain that typically occurred after meals. She apparently describes it as epigastric one point, but describes it to me as lower abdominal. She typically has a BM about once a week and has not taken anything to help with that. She denies early satiety, nausea, vomiting, dysphagia or weight loss. Her diabetes was terribly out-of-control when seen in November, with a hemoglobin A1c of 14.4 Labs were positive for H. pylori IgA, but negative for the IgG. She and her daughter report that she received some antibiotics after the lab results, so it sounds like she may have been treated for H. pylori. The antibiotic regimen is unknown from these records. She has since started oral meds but not yet the insulin they prescribed. She just got her insurance a month ago and is not yet gone to the pharmacy to pick up her insulin.  She denies rectal bleeding and has no family history of colorectal cancer. ROS:  Review of Systems  Constitutional: Negative for appetite change and unexpected weight change.  HENT: Negative for mouth sores and voice change.   Eyes: Negative for pain and redness.  Respiratory: Negative for cough and shortness of breath.   Cardiovascular: Negative for chest pain and palpitations.  Genitourinary: Negative for dysuria and hematuria.  Musculoskeletal: Negative for arthralgias and myalgias.  Skin: Negative for pallor and rash.  Neurological: Negative for weakness and headaches.  Hematological: Negative for adenopathy.     Past Medical History: Past  Medical History:  Diagnosis Date  . Diabetes mellitus   . Hyperlipemia   . Hypertension      Past Surgical History: Past Surgical History:  Procedure Laterality Date  . None       Family History: Family History  Problem Relation Age of Onset  . Diabetes Mother   . Hypertension Mother   . Breast cancer Mother   . Hypertension Father   . Breast cancer Maternal Grandmother   . Colon cancer Neg Hx   . Stomach cancer Neg Hx   . Rectal cancer Neg Hx   . Esophageal cancer Neg Hx   . Liver cancer Neg Hx     Social History: Social History   Social History  . Marital status: Single    Spouse name: N/A  . Number of children: 2  . Years of education: N/A   Occupational History  . [employer]- [employer]    Social History Main Topics  . Smoking status: Never Smoker  . Smokeless tobacco: Never Used  . Alcohol use No  . Drug use: No  . Sexual activity: Not Asked   Other Topics Concern  . None   Social History Narrative  . 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  Allergies: Allergies  Allergen Reactions  . Other     Hives to an unknown medication given in 2012 at [place] [facility]: Current Outpatient Prescriptions  Medication Sig Dispense Refill  . acetaminophen (TYLENOL) 500 MG tablet Take 1,500 mg by mouth every 6 (six) hours  as needed for mild pain.    Marland Kitchen aspirin 325 MG tablet Take 325 mg by mouth daily.    . Chlorpheniramine-Acetaminophen (CORICIDIN HBP COLD/FLU PO) Take 2 tablets by mouth daily as needed (cold symptoms).    . hydrochlorothiazide (HYDRODIURIL) 25 MG tablet Take 1 tablet (25 mg total) by mouth daily. 30 tablet 0  . ibuprofen (ADVIL,MOTRIN) 200 MG tablet Take 800 mg by mouth every 6 (six) hours as needed for moderate pain.    Marland Kitchen insulin detemir (LEVEMIR) 100 UNIT/ML injection Inject 0.4 mLs (40 Units total) into the skin at bedtime. 10 mL 0  . lisinopril (PRINIVIL,ZESTRIL) 20 MG tablet Take 1 tablet (20 mg  total) by mouth daily. 30 tablet 0  . lovastatin (MEVACOR) 20 MG tablet Take 20 mg by mouth at bedtime.    . metFORMIN (GLUCOPHAGE) 500 MG tablet Take 1 tablet (500 mg total) by mouth 2 (two) times daily with a meal. 60 tablet 0  . Na Sulfate-K Sulfate-Mg Sulf 17.5-3.13-1.6 GM/180ML SOLN Take 1 kit by mouth once. 354 mL 0   No current facility-administered medications for this visit.       ___________________________________________________________________ Objective   Exam:  BP 128/76   Pulse 96   Ht 5' 3.25" (1.607 m)   Wt 239 lb 2 oz (108.5 kg)   BMI 42.02 kg/m  Daughter present for the entire encounter  General: this is a(n) Obese middle-aged woman, no muscle wasting   Eyes: sclera anicteric, no redness  ENT: oral mucosa moist without lesions, no cervical or supraclavicular lymphadenopathy, good dentition  CV: RRR without murmur, S1/S2, no JVD, no peripheral edema  Resp: clear to auscultation bilaterally, normal RR and effort noted  GI: soft, no tenderness, with active bowel sounds. No guarding or palpable organomegaly noted.  Skin; warm and dry, no rash or jaundice noted  Neuro: awake, alert and oriented x 3. Normal gross motor function and fluent speech  Labs:  In addition to the above, on 10/22/2016, glucose 347 potassium 5.3, creatinine 0.6, LFTs normal albumin 3.6  CBC normal  Radiologic Studies:  None  Assessment: Encounter Diagnoses  Name Primary?  . Abdominal pain, chronic, epigastric Yes  . LLQ abdominal pain   . Constipation, unspecified constipation type     She probably did not truly have H. pylori infection, so it is not clear that that was related to the upper abdominal pain. It seems that that might still be occurring, but not necessarily after meals. The lower abdominal pain does seem to consistently occur after meals, and seems related to her severe constipation.  Her poorly controlled diabetes is clearly playing a role in all of this, but  she also needs a colonoscopy to rule out an obstructive cause of constipation and an upper endoscopy to rule out ulcer or malignancy. I'll also biopsy the stomach to be certain she does not have H. pylori.  Start once daily MiraLAX, increasing frequency as needed  We have scheduled her EGD and colonoscopy for about a month from now, because she plans to pick up her insulin at the pharmacy in the next few days. I hope that she can have better glucose control before we perform her procedures.  She is agreeable to EGD and colonoscopy. The benefits and risks of the planned procedure were described in detail with the patient or (when appropriate) their health care proxy.  Risks were outlined as including, but not limited to, bleeding, infection, perforation, adverse medication reaction leading to cardiac or  pulmonary decompensation, or pancreatitis (if ERCP).  The limitation of incomplete mucosal visualization was also discussed.  No guarantees or warranties were given.   Thank you for the courtesy of this consult.  Please call me with any questions or concerns.  Nelida Meuse III  CC: [place]

## 2016-12-31 ENCOUNTER — Telehealth: Payer: Self-pay | Admitting: Gastroenterology

## 2017-01-04 NOTE — Telephone Encounter (Signed)
Coupon for no more than 50 has been faxed to pts pharmacy. She is aware and will call back if any other problems.

## 2017-01-18 NOTE — Telephone Encounter (Signed)
Re-faxed to [facility] as requested

## 2017-01-18 NOTE — Telephone Encounter (Signed)
Patient states that the pharmacy did not receive coupon. Please resend to [facility] on [address]. Patient requesting a callback when sent. Best # [phone]

## 2017-01-25 ENCOUNTER — Encounter: Payer: Self-pay | Admitting: Gastroenterology

## 2017-01-30 ENCOUNTER — Emergency Department
Admission: EM | Admit: 2017-01-30 | Discharge: 2017-01-30 | Disposition: A | Discharge status: Home / Self Care | Payer: BLUE CROSS/BLUE SHIELD | Attending: Student in an Organized Health Care Education/Training Program | Admitting: Student in an Organized Health Care Education/Training Program

## 2017-01-30 ENCOUNTER — Emergency Department: Payer: BLUE CROSS/BLUE SHIELD

## 2017-01-30 ENCOUNTER — Encounter: Payer: Self-pay | Admitting: Emergency Medicine

## 2017-01-30 DIAGNOSIS — E119 Type 2 diabetes mellitus without complications: Secondary | ICD-10-CM | POA: Diagnosis not present

## 2017-01-30 DIAGNOSIS — Z79899 Other long term (current) drug therapy: Secondary | ICD-10-CM | POA: Insufficient documentation

## 2017-01-30 DIAGNOSIS — I10 Essential (primary) hypertension: Secondary | ICD-10-CM | POA: Insufficient documentation

## 2017-01-30 DIAGNOSIS — Z794 Long term (current) use of insulin: Secondary | ICD-10-CM | POA: Diagnosis not present

## 2017-01-30 DIAGNOSIS — Z7982 Long term (current) use of aspirin: Secondary | ICD-10-CM | POA: Diagnosis not present

## 2017-01-30 DIAGNOSIS — M542 Cervicalgia: Secondary | ICD-10-CM | POA: Diagnosis present

## 2017-01-30 LAB — CBC WITH DIFFERENTIAL/PLATELET
BASOS ABS: 0 10*3/uL (ref 0–0.1)
Basophils Relative: 0 %
EOS ABS: 0.1 10*3/uL (ref 0–0.7)
EOS PCT: 1 %
HCT: 39.1 % (ref 35.0–47.0)
Hemoglobin: 13.1 g/dL (ref 12.0–16.0)
LYMPHS ABS: 2.6 10*3/uL (ref 1.0–3.6)
LYMPHS PCT: 25 %
MCH: 31.9 pg (ref 26.0–34.0)
MCHC: 33.5 g/dL (ref 32.0–36.0)
MCV: 95.2 fL (ref 80.0–100.0)
MONO ABS: 0.7 10*3/uL (ref 0.2–0.9)
Monocytes Relative: 7 %
Neutro Abs: 6.9 10*3/uL — ABNORMAL HIGH (ref 1.4–6.5)
Neutrophils Relative %: 67 %
PLATELETS: 368 10*3/uL (ref 150–440)
RBC: 4.1 MIL/uL (ref 3.80–5.20)
RDW: 13.8 % (ref 11.5–14.5)
WBC: 10.3 10*3/uL (ref 3.6–11.0)

## 2017-01-30 LAB — COMPREHENSIVE METABOLIC PANEL
ALBUMIN: 3.6 g/dL (ref 3.5–5.0)
ALT: 15 U/L (ref 14–54)
AST: 32 U/L (ref 15–41)
Alkaline Phosphatase: 83 U/L (ref 38–126)
Anion gap: 12 (ref 5–15)
BILIRUBIN TOTAL: 0.4 mg/dL (ref 0.3–1.2)
BUN: 13 mg/dL (ref 6–20)
CO2: 26 mmol/L (ref 22–32)
Calcium: 9.6 mg/dL (ref 8.9–10.3)
Chloride: 97 mmol/L — ABNORMAL LOW (ref 101–111)
Creatinine, Ser: 0.86 mg/dL (ref 0.44–1.00)
GFR calc Af Amer: >60 mL/min (ref >60)
GLUCOSE: 429 mg/dL — AB (ref 65–99)
POTASSIUM: 3.7 mmol/L (ref 3.5–5.1)
Sodium: 135 mmol/L (ref 135–145)
TOTAL PROTEIN: 8.4 g/dL — AB (ref 6.5–8.1)

## 2017-01-30 LAB — POCT RAPID STREP A: Streptococcus, Group A Screen (Direct): NEGATIVE

## 2017-01-30 MED ORDER — FENTANYL CITRATE (PF) 100 MCG/2ML IJ SOLN
100.0000 ug | INTRAMUSCULAR | Status: DC | PRN
  Administered 2017-01-30: 100 ug via INTRAVENOUS
  Filled 2017-01-30: qty 2

## 2017-01-30 MED ORDER — CYCLOBENZAPRINE HCL 10 MG PO TABS
5.0000 mg | ORAL_TABLET | Freq: Once | ORAL | Status: AC
  Administered 2017-01-30: 5 mg via ORAL
  Filled 2017-01-30: qty 2

## 2017-01-30 MED ORDER — IOPAMIDOL (ISOVUE-300) INJECTION 61%
100.0000 mL | Freq: Once | INTRAVENOUS | Status: AC | PRN
  Administered 2017-01-30: 75 mL via INTRAVENOUS

## 2017-01-30 MED ORDER — SODIUM CHLORIDE 0.9 % IV BOLUS (SEPSIS)
1000.0000 mL | Freq: Once | INTRAVENOUS | Status: AC
  Administered 2017-01-30: 1000 mL via INTRAVENOUS

## 2017-01-30 MED ORDER — HYDROCODONE-ACETAMINOPHEN 5-325 MG PO TABS
1.0000 | ORAL_TABLET | Freq: Once | ORAL | Status: AC
  Administered 2017-01-30: 1 via ORAL
  Filled 2017-01-30: qty 1

## 2017-01-30 MED ORDER — NAPROXEN 375 MG PO TABS
375.0000 mg | ORAL_TABLET | Freq: Once | ORAL | Status: AC
  Administered 2017-01-30: 375 mg via ORAL
  Filled 2017-01-30: qty 1

## 2017-01-30 MED ORDER — CYCLOBENZAPRINE HCL 10 MG PO TABS: 10.0000 mg | ORAL_TABLET | Freq: Three times a day (TID) | ORAL | 0 refills | Status: DC | PRN

## 2017-01-30 MED ORDER — NAPROXEN 375 MG PO TABS: 375.0000 mg | ORAL_TABLET | Freq: Two times a day (BID) | ORAL | 0 refills | Status: AC

## 2017-01-30 MED ORDER — CYCLOBENZAPRINE HCL 10 MG PO TABS: 5.0000 mg | ORAL_TABLET | Freq: Once | ORAL | Status: DC

## 2017-01-30 MED ORDER — HYDROCODONE-ACETAMINOPHEN 5-325 MG PO TABS: 1.0000 | ORAL_TABLET | ORAL | 0 refills | Status: DC | PRN

## 2017-01-30 NOTE — ED Provider Notes (Signed)
[facility] Emergency Department Provider Note    First MD Initiated Contact with Patient 01/30/17 1355     (approximate)  I have reviewed the triage vital signs and the nursing notes.   HISTORY  Chief Complaint Torticollis and Dysphagia    HPI Cindy [name] is a 51 y.o. female with history of diabetes presents with acute right neck pain and sore throat. States that symptoms started around yesterday afternoon. No measured fevers but pain has worsened throughout the day. States that she is having difficulty swallowing due to pain but was able to tolerate a sandwich this morning. No difficulty breathing. No pain under her tongue. No recent surgeries. No history of thyroid problems. No trauma. Denies any numbness or tingling. No chest pain or shortness of breath.   Past Medical History:  Diagnosis Date  . Diabetes mellitus   . Hyperlipemia   . Hypertension    Family History  Problem Relation Age of Onset  . Diabetes Mother   . Hypertension Mother   . Breast cancer Mother   . Hypertension Father   . Breast cancer Maternal Grandmother   . Colon cancer Neg Hx   . Stomach cancer Neg Hx   . Rectal cancer Neg Hx   . Esophageal cancer Neg Hx   . Liver cancer Neg Hx    Past Surgical History:  Procedure Laterality Date  . None     Patient Active Problem List   Diagnosis Date Noted  . OBESITY 07/01/2010  . Type II or unspecified type diabetes mellitus without mention of complication, not stated as uncontrolled 05/08/2008  . HYPERLIPIDEMIA 05/08/2008  . HYPERTENSION 05/08/2008      Prior to Admission medications   Medication Sig Start Date End Date Taking? Authorizing Provider  acetaminophen (TYLENOL) 500 MG tablet Take 1,500 mg by mouth every 6 (six) hours as needed for mild pain.    Historical Provider, MD  aspirin 325 MG tablet Take 325 mg by mouth daily.    Historical Provider, MD  Chlorpheniramine-Acetaminophen (CORICIDIN HBP COLD/FLU  PO) Take 2 tablets by mouth daily as needed (cold symptoms).    Historical Provider, MD  cyclobenzaprine (FLEXERIL) 10 MG tablet Take 1 tablet (10 mg total) by mouth 3 (three) times daily as needed for muscle spasms. 01/30/17   Merlyn Lot, MD  hydrochlorothiazide (HYDRODIURIL) 25 MG tablet Take 1 tablet (25 mg total) by mouth daily. 03/15/16   Leonard Schwartz, MD  HYDROcodone-acetaminophen (NORCO) 5-325 MG tablet Take 1 tablet by mouth every 4 (four) hours as needed for moderate pain. 01/30/17   Merlyn Lot, MD  ibuprofen (ADVIL,MOTRIN) 200 MG tablet Take 800 mg by mouth every 6 (six) hours as needed for moderate pain.    Historical Provider, MD  insulin detemir (LEVEMIR) 100 UNIT/ML injection Inject 0.4 mLs (40 Units total) into the skin at bedtime. 03/15/16   Leonard Schwartz, MD  lisinopril (PRINIVIL,ZESTRIL) 20 MG tablet Take 1 tablet (20 mg total) by mouth daily. 03/15/16   Leonard Schwartz, MD  lovastatin (MEVACOR) 20 MG tablet Take 20 mg by mouth at bedtime.    Historical Provider, MD  metFORMIN (GLUCOPHAGE) 500 MG tablet Take 1 tablet (500 mg total) by mouth 2 (two) times daily with a meal. 03/15/16   Leonard Schwartz, MD  naproxen (NAPROSYN) 375 MG tablet Take 1 tablet (375 mg total) by mouth 2 (two) times daily with a meal. 01/30/17 02/09/17  Merlyn Lot, MD    Allergies Other  Social History Social History  Substance Use Topics  . Smoking status: Never Smoker  . Smokeless tobacco: Never Used  . Alcohol use No    Review of Systems Patient denies headaches, rhinorrhea, blurry vision, numbness, shortness of breath, chest pain, edema, cough, abdominal pain, nausea, vomiting, diarrhea, dysuria, fevers, rashes or hallucinations unless otherwise stated above in HPI. ____________________________________________   PHYSICAL EXAM:  VITAL SIGNS: Vitals:   01/30/17 1430 01/30/17 1500  BP: 126/78 (!) 147/96  Pulse: 91   Resp: (!) 25 11  Temp:      Constitutional: Alert and oriented.  in no acute distress. Eyes: Conjunctivae are normal. PERRL. EOMI. Head: Atraumatic. Nose: No congestion/rhinnorhea. Mouth/Throat: Mucous membranes are moist.  Oropharynx non-erythematous. Neck: No stridor. ttp of anterior and post auricular lymphnodes, no pulsatile mass, pain with movement of neck particularly with looking to the left. Cardiovascular: Normal rate, regular rhythm. Grossly normal heart sounds.  Good peripheral circulation. Respiratory: Normal respiratory effort.  No retractions. Lungs CTAB. Gastrointestinal: Soft and nontender. No distention. No abdominal bruits. No CVA tenderness. Musculoskeletal: No lower extremity tenderness nor edema.  No joint effusions. Neurologic:  Normal speech and language. No gross focal neurologic deficits are appreciated. No gait instability. Skin:  Skin is warm, dry and intact. No rash noted. Psychiatric: Mood and affect are normal. Speech and behavior are normal.  ____________________________________________   LABS (all labs ordered are listed, but only abnormal results are displayed)  Results for orders placed or performed during the hospital encounter of 01/30/17 (from the past 24 hour(s))  Comprehensive metabolic panel     Status: Abnormal   Collection Time: 01/30/17 11:46 AM  Result Value Ref Range   Sodium 135 135 - 145 mmol/L   Potassium 3.7 3.5 - 5.1 mmol/L   Chloride 97 (L) 101 - 111 mmol/L   CO2 26 22 - 32 mmol/L   Glucose, Bld 429 (H) 65 - 99 mg/dL   BUN 13 6 - 20 mg/dL   Creatinine, Ser 0.86 0.44 - 1.00 mg/dL   Calcium 9.6 8.9 - 10.3 mg/dL   Total Protein 8.4 (H) 6.5 - 8.1 g/dL   Albumin 3.6 3.5 - 5.0 g/dL   AST 32 15 - 41 U/L   ALT 15 14 - 54 U/L   Alkaline Phosphatase 83 38 - 126 U/L   Total Bilirubin 0.4 0.3 - 1.2 mg/dL   GFR calc non Af Amer >60 >60 mL/min   GFR calc Af Amer >60 >60 mL/min   Anion gap 12 5 - 15  CBC with Differential     Status: Abnormal   Collection Time: 01/30/17 11:46 AM  Result Value Ref Range     WBC 10.3 3.6 - 11.0 K/uL   RBC 4.10 3.80 - 5.20 MIL/uL   Hemoglobin 13.1 12.0 - 16.0 g/dL   HCT 39.1 35.0 - 47.0 %   MCV 95.2 80.0 - 100.0 fL   MCH 31.9 26.0 - 34.0 pg   MCHC 33.5 32.0 - 36.0 g/dL   RDW 13.8 11.5 - 14.5 %   Platelets 368 150 - 440 K/uL   Neutrophils Relative % 67 %   Neutro Abs 6.9 (H) 1.4 - 6.5 K/uL   Lymphocytes Relative 25 %   Lymphs Abs 2.6 1.0 - 3.6 K/uL   Monocytes Relative 7 %   Monocytes Absolute 0.7 0.2 - 0.9 K/uL   Eosinophils Relative 1 %   Eosinophils Absolute 0.1 0 - 0.7 K/uL   Basophils Relative 0 %  Basophils Absolute 0.0 0 - 0.1 K/uL  POCT rapid strep A [facility])     Status: None   Collection Time: 01/30/17  2:51 PM  Result Value Ref Range   Streptococcus, Group A Screen (Direct) NEGATIVE NEGATIVE   ____________________________________________  EKG ED ECG REPORT I, Merlyn Lot, the attending physician, personally viewed and interpreted this ECG.   Date: 01/30/2017  EKG Time: 14:09  Rate: 90  Rhythm: normal sinus  Axis: normal  Intervals:normal intervals  ST&T Change: no ST elevations or depressions  ____________________________________________  RADIOLOGY  I personally reviewed all radiographic images ordered to evaluate for the above acute complaints and reviewed radiology reports and findings.  These findings were personally discussed with the patient.  Please see medical record for radiology report.  ____________________________________________   PROCEDURES  Procedure(s) performed:  Procedures    Critical Care performed: no ____________________________________________   INITIAL IMPRESSION / ASSESSMENT AND PLAN / ED COURSE  Pertinent labs & imaging results that were available during my care of the patient were reviewed by me and considered in my medical decision making (see chart for details).  DDX: adenitis, pta, rpa, strep, torticollis  Cindy [name] is a 51 y.o. who presents to the ED with right  neck pain and tenderness with pain with swallowing starting yesterday. Patient without any trismus. No sublingual firmness or tenderness to palpation. No overlying erythema. No pulsatile mass. Right tonsillar pillar does appear more prominent but uvula is midline. Will provide symptomatic management. We'll order CT scan to evaluate for any evidence of deep space faction or abscess.  The patient will be placed on continuous pulse oximetry and telemetry for monitoring.  Laboratory evaluation will be sent to evaluate for the above complaints.     Clinical Course as of Jan 31 1607  Sun Jan 30, 2017  1553 CT neck without abscess  [PR]  1553 Presentation most consistent with torticollis.  Will provide dose of decadron as she is complaining of throat pain to suggest pharyngitis.  Not consistent with ludwigs, strep, cellulitis.  Patient was able to tolerate PO and was able to ambulate with a steady gait.  Have discussed with the patient and available family all diagnostics and treatments performed thus far and all questions were answered to the best of my ability. The patient demonstrates understanding and agreement with plan.    [PR]    Clinical Course User Index [PR] Merlyn Lot, MD     ____________________________________________   FINAL CLINICAL IMPRESSION(S) / ED DIAGNOSES  Final diagnoses:  Neck pain on right side      NEW MEDICATIONS STARTED DURING THIS VISIT:  New Prescriptions   CYCLOBENZAPRINE (FLEXERIL) 10 MG TABLET    Take 1 tablet (10 mg total) by mouth 3 (three) times daily as needed for muscle spasms.   HYDROCODONE-ACETAMINOPHEN (NORCO) 5-325 MG TABLET    Take 1 tablet by mouth every 4 (four) hours as needed for moderate pain.   NAPROXEN (NAPROSYN) 375 MG TABLET    Take 1 tablet (375 mg total) by mouth 2 (two) times daily with a meal.     Note:  This document was prepared using Dragon voice recognition software and may include unintentional dictation errors.      Merlyn Lot, MD 01/30/17 [phone]

## 2017-01-30 NOTE — ED Triage Notes (Signed)
Pt presents to ED via POV c/o neck stiffness to back and side, no mass, mild swelling under chin. Pt states pain started yesterday around 1500 and worsened today. Also c/o difficulty swallowing but was able to eat sandwich today. No resp distress noted.

## 2017-02-08 ENCOUNTER — Encounter: Payer: Self-pay | Admitting: Gastroenterology

## 2017-02-08 ENCOUNTER — Ambulatory Visit (AMBULATORY_SURGERY_CENTER): Payer: BLUE CROSS/BLUE SHIELD | Admitting: Gastroenterology

## 2017-02-08 VITALS — BP 111/64 | HR 96 | Temp 97.3°F | Resp 19 | Ht 63.0 in | Wt 239.0 lb

## 2017-02-08 DIAGNOSIS — D123 Benign neoplasm of transverse colon: Secondary | ICD-10-CM

## 2017-02-08 DIAGNOSIS — K2990 Gastroduodenitis, unspecified, without bleeding: Secondary | ICD-10-CM

## 2017-02-08 DIAGNOSIS — K2970 Gastritis, unspecified, without bleeding: Secondary | ICD-10-CM | POA: Diagnosis not present

## 2017-02-08 DIAGNOSIS — R1013 Epigastric pain: Secondary | ICD-10-CM | POA: Diagnosis not present

## 2017-02-08 DIAGNOSIS — K5900 Constipation, unspecified: Secondary | ICD-10-CM | POA: Diagnosis not present

## 2017-02-08 DIAGNOSIS — R1032 Left lower quadrant pain: Secondary | ICD-10-CM

## 2017-02-08 MED ORDER — SODIUM CHLORIDE 0.9 % IV SOLN: 500.0000 mL | INTRAVENOUS | Status: DC

## 2017-02-08 MED ORDER — INSULIN REGULAR HUMAN 100 UNIT/ML IJ SOLN
10.0000 [IU] | Freq: Once | INTRAMUSCULAR | Status: AC
  Administered 2017-02-08: 10 [IU] via SUBCUTANEOUS

## 2017-02-08 NOTE — Op Note (Signed)
[facility] Patient Name: Cindy [name] Procedure Date: 02/08/2017 12:58 PM MRN: [id number] Endoscopist: [place]. Loletha Carrow , MD Age: 51 Referring MD:  Date of Birth: 1966/01/25 Gender: Female Account #: [id number] Procedure:                Colonoscopy Indications:              Lower abdominal pain, Constipation Medicines:                Monitored Anesthesia Care Procedure:                Pre-Anesthesia Assessment:                           - Prior to the procedure, a History and Physical                            was performed, and patient medications and                            allergies were reviewed. The patient's tolerance of                            previous anesthesia was also reviewed. The risks                            and benefits of the procedure and the sedation                            options and risks were discussed with the patient.                            All questions were answered, and informed consent                            was obtained. Prior Anticoagulants: The patient has                            taken no previous anticoagulant or antiplatelet                            agents. ASA Grade Assessment: III - A patient with                            severe systemic disease. After reviewing the risks                            and benefits, the patient was deemed in                            satisfactory condition to undergo the procedure.                           After obtaining informed consent, the colonoscope  was passed under direct vision. Throughout the                            procedure, the patient's blood pressure, pulse, and                            oxygen saturations were monitored continuously. The                            Colonoscope was introduced through the anus and                            advanced to the the cecum, identified by                            appendiceal orifice and  ileocecal valve. The                            quality of the bowel preparation was good. The                            ileocecal valve, appendiceal orifice, and rectum                            were photographed. Scope In: 1:35:57 PM Scope Out: 1:45:38 PM Scope Withdrawal Time: 0 hours 7 minutes 26 seconds  Total Procedure Duration: 0 hours 9 minutes 41 seconds  Findings:                 The perianal and digital rectal examinations were                            normal.                           A 4 mm polyp was found in the proximal transverse                            colon. The polyp was sessile. The polyp was removed                            with a cold snare. Resection and retrieval were                            complete.                           The exam was otherwise without abnormality on                            direct and retroflexion views. Complications:            No immediate complications. Estimated Blood Loss:     Estimated blood loss: none. Impression:               - One 4 mm polyp in the  proximal transverse colon,                            removed with a cold snare. Resected and retrieved.                           - The examination was otherwise normal on direct                            and retroflexion views.                           Symptoms appear to be largely related to                            poorly-controlled diabetes. Recommendation:           - Patient has a contact number available for                            emergencies. The signs and symptoms of potential                            delayed complications were discussed with the                            patient. Return to normal activities tomorrow.                            Written discharge instructions were provided to the                            patient.                           - Resume previous diet.                           - Continue present medications.                            - Await pathology results.                           - Repeat colonoscopy is recommended for                            surveillance. The colonoscopy date will be                            determined after pathology results from today's                            exam become available for review.                           - Return to primary care physician soon to  address                            management of diabetes. Mariyana Fulop L. Loletha Carrow, MD 02/08/2017 1:54:40 PM This report has been signed electronically.

## 2017-02-08 NOTE — Progress Notes (Signed)
Patient ID: Cindy [name], female   DOB: 22-Dec-1965, 51 y.o.   MRN: [id number]

## 2017-02-08 NOTE — Patient Instructions (Signed)
YOU HAD AN ENDOSCOPIC PROCEDURE TODAY AT [facility]:   Refer to the procedure report that was given to you for any specific questions about what was found during the examination.  If the procedure report does not answer your questions, please call your gastroenterologist to clarify.  If you requested that your care partner not be given the details of your procedure findings, then the procedure report has been included in a sealed envelope for you to review at your convenience later.  YOU SHOULD EXPECT: Some feelings of bloating in the abdomen. Passage of more gas than usual.  Walking can help get rid of the air that was put into your GI tract during the procedure and reduce the bloating. If you had a lower endoscopy (such as a colonoscopy or flexible sigmoidoscopy) you may notice spotting of blood in your stool or on the toilet paper. If you underwent a bowel prep for your procedure, you may not have a normal bowel movement for a few days.  Please Note:  You might notice some irritation and congestion in your nose or some drainage.  This is from the oxygen used during your procedure.  There is no need for concern and it should clear up in a day or so.  SYMPTOMS TO REPORT IMMEDIATELY:   Following lower endoscopy (colonoscopy or flexible sigmoidoscopy):  Excessive amounts of blood in the stool  Significant tenderness or worsening of abdominal pains  Swelling of the abdomen that is new, acute  Fever of 100F or higher   Following upper endoscopy (EGD)  Vomiting of blood or coffee ground material  New chest pain or pain under the shoulder blades  Painful or persistently difficult swallowing  New shortness of breath  Fever of 100F or higher  Black, tarry-looking stools   Please read all handouts given to you by your recovery nurse. Follow up with your primary care dr for diabetes management.  DIET:  We do recommend a small meal at first, but then you may proceed to your  regular diet.  Drink plenty of fluids but you should avoid alcoholic beverages for 24 hours.  ACTIVITY:  You should plan to take it easy for the rest of today and you should NOT DRIVE or use heavy machinery until tomorrow (because of the sedation medicines used during the test).    FOLLOW UP: Our staff will call the number listed on your records the next business day following your procedure to check on you and address any questions or concerns that you may have regarding the information given to you following your procedure. If we do not reach you, we will leave a message.  However, if you are feeling well and you are not experiencing any problems, there is no need to return our call.  We will assume that you have returned to your regular daily activities without incident.  If any biopsies were taken you will be contacted by phone or by letter within the next 1-3 weeks.  Please call us at [phone] if you have not heard about the biopsies in 3 weeks.    SIGNATURES/CONFIDENTIALITY: You and/or your care partner have signed paperwork which will be entered into your electronic medical record.  These signatures attest to the fact that that the information above on your After Visit Summary has been reviewed and is understood.  Full responsibility of the confidentiality of this discharge information lies with you and/or your care-partner.   Thank you for letting us take  care of your healthcare needs today.

## 2017-02-08 NOTE — Progress Notes (Signed)
Called to room to assist during endoscopic procedure.  Patient ID and intended procedure confirmed with present staff. Received instructions for my participation in the procedure from the performing physician.  

## 2017-02-08 NOTE — Op Note (Signed)
[facility] Patient Name: Cindy [name] Procedure Date: 02/08/2017 12:58 PM MRN: [id number] Endoscopist: [place]. Loletha Carrow , MD Age: 51 Referring MD:  Date of Birth: 1966/01/20 Gender: Female Account #: [id number] Procedure:                Upper GI endoscopy Indications:              Epigastric abdominal pain, Nausea (prior H. pylori                            treatment) Medicines:                Monitored Anesthesia Care Procedure:                Pre-Anesthesia Assessment:                           - Prior to the procedure, a History and Physical                            was performed, and patient medications and                            allergies were reviewed. The patient's tolerance of                            previous anesthesia was also reviewed. The risks                            and benefits of the procedure and the sedation                            options and risks were discussed with the patient.                            All questions were answered, and informed consent                            was obtained. Prior Anticoagulants: The patient has                            taken no previous anticoagulant or antiplatelet                            agents. ASA Grade Assessment: III - A patient with                            severe systemic disease. After reviewing the risks                            and benefits, the patient was deemed in                            satisfactory condition to undergo the procedure.  After obtaining informed consent, the endoscope was                            passed under direct vision. Throughout the                            procedure, the patient's blood pressure, pulse, and                            oxygen saturations were monitored continuously. The                            Endoscope was introduced through the mouth, and                            advanced to the second part of duodenum.  The upper                            GI endoscopy was accomplished without difficulty.                            The patient tolerated the procedure well. Scope In: Scope Out: Findings:                 The larynx was normal.                           The esophagus was normal.                           Scattered moderate inflammation characterized by                            erythema was found in the entire examined stomach.                            Biopsies were taken with a cold forceps for                            histology.                           The cardia and gastric fundus were normal on                            retroflexion.                           The examined duodenum was normal. Complications:            No immediate complications. Estimated Blood Loss:     Estimated blood loss: none. Impression:               - Normal larynx.                           - Normal esophagus.                           -  Bile gastritis. Biopsied.                           - Normal examined duodenum.                           Poorly-controlled diabetes appears to be a likely                            cause of symptoms. Recommendation:           - Resume previous diet.                           - Continue present medications.                           - Await pathology results.                           - See the other procedure note for documentation of                            additional recommendations.                           Follow up soon with primary care regarding diabetes                            management. Randie Tallarico L. Loletha Carrow, MD 02/08/2017 1:51:16 PM This report has been signed electronically.

## 2017-02-08 NOTE — Progress Notes (Signed)
Patient awakening,vss,report to rn 

## 2017-02-09 ENCOUNTER — Telehealth: Payer: Self-pay

## 2017-02-09 NOTE — Telephone Encounter (Signed)
  Follow up Call-  Call back number 02/08/2017  Post procedure Call Back phone  # [phone]  Permission to leave phone message Yes  Some recent data might be hidden     Patient questions:  Do you have a fever, pain , or abdominal swelling? No. Pain Score  0 *  Have you tolerated food without any problems? Yes.    Have you been able to return to your normal activities? Yes.    Do you have any questions about your discharge instructions: Diet   No. Medications  No. Follow up visit  No.  Do you have questions or concerns about your Care? No.  Actions: * If pain score is 4 or above: No action needed, pain <4.  No problems noted per pt. maw

## 2017-02-11 ENCOUNTER — Encounter: Payer: Self-pay | Admitting: Gastroenterology

## 2017-06-24 ENCOUNTER — Ambulatory Visit (HOSPITAL_COMMUNITY)
Admission: EM | Admit: 2017-06-24 | Discharge: 2017-06-24 | Disposition: A | Discharge status: Home / Self Care | Payer: BLUE CROSS/BLUE SHIELD | Attending: Internal Medicine | Admitting: Internal Medicine

## 2017-06-24 ENCOUNTER — Encounter (HOSPITAL_COMMUNITY): Payer: Self-pay

## 2017-06-24 DIAGNOSIS — M542 Cervicalgia: Secondary | ICD-10-CM

## 2017-06-24 DIAGNOSIS — E875 Hyperkalemia: Secondary | ICD-10-CM | POA: Diagnosis not present

## 2017-06-24 DIAGNOSIS — R42 Dizziness and giddiness: Secondary | ICD-10-CM

## 2017-06-24 LAB — POCT URINALYSIS DIP (DEVICE)
Bilirubin Urine: NEGATIVE
Glucose, UA: 500 mg/dL — AB
Hgb urine dipstick: NEGATIVE
Ketones, ur: NEGATIVE mg/dL
LEUKOCYTES UA: NEGATIVE
Nitrite: NEGATIVE
Protein, ur: NEGATIVE mg/dL
Specific Gravity, Urine: 1.01 (ref 1.005–1.030)
UROBILINOGEN UA: 0.2 mg/dL (ref 0.0–1.0)
pH: 5 (ref 5.0–8.0)

## 2017-06-24 LAB — POCT I-STAT, CHEM 8
BUN: 17 mg/dL (ref 6–20)
CHLORIDE: 94 mmol/L — AB (ref 101–111)
Calcium, Ion: 1.11 mmol/L — ABNORMAL LOW (ref 1.15–1.40)
Creatinine, Ser: 0.7 mg/dL (ref 0.44–1.00)
GLUCOSE: 413 mg/dL — AB (ref 65–99)
HCT: 43 % (ref 36.0–46.0)
HEMOGLOBIN: 14.6 g/dL (ref 12.0–15.0)
POTASSIUM: 3.7 mmol/L (ref 3.5–5.1)
Sodium: 136 mmol/L (ref 135–145)
TCO2: 29 mmol/L (ref 0–100)

## 2017-06-24 MED ORDER — NAPROXEN 500 MG PO TABS: 500.0000 mg | ORAL_TABLET | Freq: Two times a day (BID) | ORAL | 0 refills | Status: DC

## 2017-06-24 MED ORDER — CYCLOBENZAPRINE HCL 10 MG PO TABS: 10.0000 mg | ORAL_TABLET | Freq: Two times a day (BID) | ORAL | 0 refills | Status: DC | PRN

## 2017-06-24 NOTE — ED Triage Notes (Signed)
Pt here for lightheadedness when she woke this morning also having tightness around her neck radiating down her left arm which just started and b/s was 499 at 1:15pm. Per Valere Dross get orthostatics and a EKG.

## 2017-06-24 NOTE — ED Provider Notes (Signed)
CSN: [id number]     Arrival date & time 06/24/17  1420 History   None    Chief Complaint  Patient presents with  . Hyperglycemia   (Consider location/radiation/quality/duration/timing/severity/associated sxs/prior Treatment) Patient c/o light headedness and discomfort in her left neck and numbness radiating down her left arm.  She is diabetic and her blood sugars are elevated at 500 according to patient. She is seeing PCP for her diabetes.   The history is provided by the patient.  Hyperglycemia  Blood sugar level PTA:  500 Severity:  Severe Onset quality:  Sudden Duration:  1 day Timing:  Constant Progression:  Worsening Chronicity:  New Associated symptoms: dizziness and fatigue     Past Medical History:  Diagnosis Date  . Diabetes mellitus   . GERD (gastroesophageal reflux disease)   . Hyperlipemia   . Hypertension    Past Surgical History:  Procedure Laterality Date  . None     Family History  Problem Relation Age of Onset  . Diabetes Mother   . Hypertension Mother   . Breast cancer Mother   . Hypertension Father   . Breast cancer Maternal Grandmother   . Colon cancer Neg Hx   . Stomach cancer Neg Hx   . Rectal cancer Neg Hx   . Esophageal cancer Neg Hx   . Liver cancer Neg Hx    Social History  Substance Use Topics  . Smoking status: Never Smoker  . Smokeless tobacco: Never Used  . Alcohol use No   OB History    No data available     Review of Systems  Constitutional: Positive for fatigue.  HENT: Negative.   Eyes: Negative.   Respiratory: Negative.   Cardiovascular: Negative.   Endocrine: Negative.   Genitourinary: Negative.   Musculoskeletal: Positive for arthralgias.  Allergic/Immunologic: Negative.   Neurological: Positive for dizziness.  Hematological: Negative.   Psychiatric/Behavioral: Negative.     Allergies  Other  Home Medications   Prior to Admission medications   Medication Sig Start Date End Date Taking? Authorizing  Provider  cyclobenzaprine (FLEXERIL) 10 MG tablet Take 1 tablet (10 mg total) by mouth 3 (three) times daily as needed for muscle spasms. 01/30/17  Yes Merlyn Lot, MD  hydrochlorothiazide (HYDRODIURIL) 25 MG tablet Take 1 tablet (25 mg total) by mouth daily. 03/15/16  Yes Leonard Schwartz, MD  HYDROcodone-acetaminophen (NORCO) 5-325 MG tablet Take 1 tablet by mouth every 4 (four) hours as needed for moderate pain. 01/30/17  Yes Merlyn Lot, MD  insulin detemir (LEVEMIR) 100 UNIT/ML injection Inject 0.4 mLs (40 Units total) into the skin at bedtime. 03/15/16  Yes Leonard Schwartz, MD  lisinopril (PRINIVIL,ZESTRIL) 20 MG tablet Take 1 tablet (20 mg total) by mouth daily. 03/15/16  Yes Leonard Schwartz, MD  metFORMIN (GLUCOPHAGE) 500 MG tablet Take 1 tablet (500 mg total) by mouth 2 (two) times daily with a meal. 03/15/16  Yes Leonard Schwartz, MD  acetaminophen (TYLENOL) 500 MG tablet Take 1,500 mg by mouth every 6 (six) hours as needed for mild pain.    [provider]  aspirin 325 MG tablet Take 325 mg by mouth daily.    [provider]  Chlorpheniramine-Acetaminophen (CORICIDIN HBP COLD/FLU PO) Take 2 tablets by mouth daily as needed (cold symptoms).    [provider]  cyclobenzaprine (FLEXERIL) 10 MG tablet Take 1 tablet (10 mg total) by mouth 2 (two) times daily as needed for muscle spasms. 06/24/17   Lysbeth Penner, FNP  ibuprofen (ADVIL,MOTRIN)  200 MG tablet Take 800 mg by mouth every 6 (six) hours as needed for moderate pain.    [provider]  lovastatin (MEVACOR) 20 MG tablet Take 20 mg by mouth at bedtime.    [provider]  naproxen (NAPROSYN) 500 MG tablet Take 1 tablet (500 mg total) by mouth 2 (two) times daily with a meal. 06/24/17   Monita Swier, Orson Ape, FNP   Meds Ordered and Administered this Visit  Medications - No data to display  BP 119/79 (BP Location: Left Arm)   Pulse 99   Temp 98.2 F (36.8 C) (Oral)   Resp 18   SpO2 96%   Orthostatic VS for the past 24 hrs:  BP- Lying Pulse- Lying BP- Sitting Pulse- Sitting BP- Standing at 0 minutes Pulse- Standing at 0 minutes  06/24/17 1427 112/66 97 114/70 90 119/79 97    Physical Exam  Constitutional: She is oriented to person, place, and time. She appears well-developed and well-nourished.  HENT:  Head: Normocephalic and atraumatic.  Eyes: Pupils are equal, round, and reactive to light. Conjunctivae and EOM are normal.  Neck: Normal range of motion. Neck supple.  Cardiovascular: Normal rate, regular rhythm and normal heart sounds.   Pulmonary/Chest: Effort normal and breath sounds normal.  Neurological: She is alert and oriented to person, place, and time.  Nursing note and vitals reviewed.   Urgent Care Course     Procedures (including critical care time)  Labs Review Labs Reviewed  POCT URINALYSIS DIP (DEVICE) - Abnormal; Notable for the following:       Result Value   Glucose, UA 500 (*)    All other components within normal limits  POCT I-STAT, CHEM 8 - Abnormal; Notable for the following:    Chloride 94 (*)    Glucose, Bld 413 (*)    Calcium, Ion 1.11 (*)    All other components within normal limits    Imaging Review No results found.   Visual Acuity Review  Right Eye Distance:   Left Eye Distance:   Bilateral Distance:    Right Eye Near:   Left Eye Near:    Bilateral Near:         MDM   1. Hyperkalemia   2. Cervicalgia    EKG shows NSR w/o acute ST-T changes  Push po fluids, take insulin as rx'd, follow up with PCP ASAP.  If feeling worst go to ED.  Naprosyn  Flexeril      Lysbeth Penner, [place] 06/24/17 1540

## 2017-06-24 NOTE — Discharge Instructions (Signed)
Push po fluids, follow up with PCP next week

## 2017-07-20 ENCOUNTER — Other Ambulatory Visit: Payer: Self-pay | Admitting: Physician Assistant

## 2017-07-20 DIAGNOSIS — Z1231 Encounter for screening mammogram for malignant neoplasm of breast: Secondary | ICD-10-CM

## 2017-08-10 ENCOUNTER — Ambulatory Visit: Payer: BLUE CROSS/BLUE SHIELD

## 2017-10-06 ENCOUNTER — Encounter (HOSPITAL_COMMUNITY): Payer: Self-pay | Admitting: Emergency Medicine

## 2017-10-06 ENCOUNTER — Ambulatory Visit (HOSPITAL_COMMUNITY)
Admission: EM | Admit: 2017-10-06 | Discharge: 2017-10-06 | Disposition: A | Discharge status: Home / Self Care | Payer: Self-pay | Attending: Family Medicine | Admitting: Family Medicine

## 2017-10-06 ENCOUNTER — Emergency Department (HOSPITAL_COMMUNITY)
Admission: EM | Admit: 2017-10-06 | Discharge: 2017-10-06 | Disposition: A | Discharge status: Home / Self Care | Payer: Self-pay | Attending: Emergency Medicine | Admitting: Emergency Medicine

## 2017-10-06 ENCOUNTER — Encounter (HOSPITAL_COMMUNITY): Payer: Self-pay | Admitting: *Deleted

## 2017-10-06 DIAGNOSIS — H01022 Squamous blepharitis right lower eyelid: Secondary | ICD-10-CM

## 2017-10-06 DIAGNOSIS — Z5321 Procedure and treatment not carried out due to patient leaving prior to being seen by health care provider: Secondary | ICD-10-CM | POA: Insufficient documentation

## 2017-10-06 DIAGNOSIS — R600 Localized edema: Secondary | ICD-10-CM | POA: Insufficient documentation

## 2017-10-06 MED ORDER — ERYTHROMYCIN 5 MG/GM OP OINT: TOPICAL_OINTMENT | OPHTHALMIC | 0 refills | Status: DC

## 2017-10-06 NOTE — ED Triage Notes (Signed)
Pt complains of swelling and pain to upper and lower eyelids for the past week. Pt denies use of new cosmetics, new medications or diet.

## 2017-10-06 NOTE — ED Notes (Signed)
Registration staff said patient left.

## 2017-10-06 NOTE — ED Provider Notes (Signed)
[place]    CSN: [id number] Arrival date & time: 10/06/17  1000     History   Chief Complaint Chief Complaint  Patient presents with  . Eye Problem    HPI Cindy [name] is a 51 y.o. female.   With history of HTN, T2DM, HLD, and GERD, Presenting today complaining of red and swollen right lower eyelid for 4 days accompanied by eye redness, eye discharge, and mild blurry vision. Denies eye pain but is "kind of sore". Patient states that the eye was shut upon waking up this morning. She have tried warm compressed and OTC eye lubrication without relief.   Noted BP to be elevated; patient states that she did not take her BP medicine this morning.       Past Medical History:  Diagnosis Date  . Diabetes mellitus   . GERD (gastroesophageal reflux disease)   . Hyperlipemia   . Hypertension     Patient Active Problem List   Diagnosis Date Noted  . OBESITY 07/01/2010  . Type II or unspecified type diabetes mellitus without mention of complication, not stated as uncontrolled 05/08/2008  . HYPERLIPIDEMIA 05/08/2008  . HYPERTENSION 05/08/2008    Past Surgical History:  Procedure Laterality Date  . None      OB History    No data available       Home Medications    Prior to Admission medications   Medication Sig Start Date End Date Taking? Authorizing Provider  acetaminophen (TYLENOL) 500 MG tablet Take 1,500 mg by mouth every 6 (six) hours as needed for mild pain.    [provider]  aspirin 325 MG tablet Take 325 mg by mouth daily.    [provider]  Chlorpheniramine-Acetaminophen (CORICIDIN HBP COLD/FLU PO) Take 2 tablets by mouth daily as needed (cold symptoms).    [provider]  cyclobenzaprine (FLEXERIL) 10 MG tablet Take 1 tablet (10 mg total) by mouth 3 (three) times daily as needed for muscle spasms. 01/30/17   Merlyn Lot, MD  cyclobenzaprine (FLEXERIL) 10 MG tablet Take 1 tablet (10 mg total) by mouth 2  (two) times daily as needed for muscle spasms. 06/24/17   Lysbeth Penner, FNP  erythromycin ophthalmic ointment Place a 1/2 inch ribbon of ointment into the lower eyelid four times a day 10/06/17   Barry Dienes, NP  hydrochlorothiazide (HYDRODIURIL) 25 MG tablet Take 1 tablet (25 mg total) by mouth daily. 03/15/16   Leonard Schwartz, MD  HYDROcodone-acetaminophen (NORCO) 5-325 MG tablet Take 1 tablet by mouth every 4 (four) hours as needed for moderate pain. 01/30/17   Merlyn Lot, MD  ibuprofen (ADVIL,MOTRIN) 200 MG tablet Take 800 mg by mouth every 6 (six) hours as needed for moderate pain.    [provider]  insulin detemir (LEVEMIR) 100 UNIT/ML injection Inject 0.4 mLs (40 Units total) into the skin at bedtime. 03/15/16   Leonard Schwartz, MD  lisinopril (PRINIVIL,ZESTRIL) 20 MG tablet Take 1 tablet (20 mg total) by mouth daily. 03/15/16   Leonard Schwartz, MD  lovastatin (MEVACOR) 20 MG tablet Take 20 mg by mouth at bedtime.    [provider]  metFORMIN (GLUCOPHAGE) 500 MG tablet Take 1 tablet (500 mg total) by mouth 2 (two) times daily with a meal. 03/15/16   Leonard Schwartz, MD  naproxen (NAPROSYN) 500 MG tablet Take 1 tablet (500 mg total) by mouth 2 (two) times daily with a meal. 06/24/17   [place], Orson Ape, FNP  Family History Family History  Problem Relation Age of Onset  . Diabetes Mother   . Hypertension Mother   . Breast cancer Mother   . Hypertension Father   . Breast cancer Maternal Grandmother   . Colon cancer Neg Hx   . Stomach cancer Neg Hx   . Rectal cancer Neg Hx   . Esophageal cancer Neg Hx   . Liver cancer Neg Hx     Social History Social History   Tobacco Use  . Smoking status: Never Smoker  . Smokeless tobacco: Never Used  Substance Use Topics  . Alcohol use: No  . Drug use: No     Allergies   Other   Review of Systems Review of Systems  Constitutional:       See HPI     Physical Exam Triage Vital Signs ED Triage Vitals  Enc  Vitals Group     BP 10/06/17 1017 (!) 150/77     Pulse Rate 10/06/17 1015 73     Resp 10/06/17 1015 16     Temp 10/06/17 1015 97.6 F (36.4 C)     Temp Source 10/06/17 1015 Oral     SpO2 10/06/17 1015 97 %     Weight 10/06/17 1016 242 lb (109.8 kg)     Height 10/06/17 1016 5\' 3"  (1.6 m)     Head Circumference --      Peak Flow --      Pain Score 10/06/17 1017 8     Pain Loc --      Pain Edu? --      Excl. in [place]? --    No data found.  Updated Vital Signs BP (!) 150/77 (BP Location: Left Arm)   Pulse 73   Temp 97.6 F (36.4 C) (Oral)   Resp 16   Ht 5\' 3"  (1.6 m)   Wt 242 lb (109.8 kg)   SpO2 97%   BMI 42.87 kg/m   Visual Acuity Right Eye Distance: 20/25 Left Eye Distance: 20/70 Bilateral Distance: 20/40  Right Eye Near:   Left Eye Near:    Bilateral Near:     Physical Exam  Constitutional: She is oriented to person, place, and time. She appears well-developed and well-nourished.  Eyes: Conjunctivae are normal. Pupils are equal, round, and reactive to light. Right eye exhibits no discharge. Left eye exhibits no discharge.  Inflammation noted on right lower eyelid margin.   Cardiovascular: Normal rate and regular rhythm.  Pulmonary/Chest: Effort normal and breath sounds normal.  Neurological: She is alert and oriented to person, place, and time.  Nursing note and vitals reviewed.    UC Treatments / Results  Labs (all labs ordered are listed, but only abnormal results are displayed) Labs Reviewed - No data to display  EKG  EKG Interpretation None       Radiology No results found.  Procedures Procedures (including critical care time)  Medications Ordered in UC Medications - No data to display   Initial Impression / Assessment and Plan / UC Course  I have reviewed the triage vital signs and the nursing notes.  Pertinent labs & imaging results that were available during my care of the patient were reviewed by me and considered in my medical decision  making (see chart for details).     Final Clinical Impressions(s) / UC Diagnoses   Final diagnoses:  Squamous blepharitis of right lower eyelid   1) Clean lid margins twice daily using warm cloth 2) Apply warm  compress 2-3 times a day 3) Start Erythromycin Ointment 3) Please return or f/u with PCP for no improvement.  4) Follow up with your primary care doctor in regards to your blood pressure.    ED Discharge Orders        Ordered    erythromycin ophthalmic ointment     10/06/17 1037     Controlled Substance Prescriptions [place] Controlled Substance Registry consulted? Not Applicable   Barry Dienes, NP 10/06/17 1045

## 2017-10-06 NOTE — Discharge Instructions (Signed)
1) Clean lid margins twice daily using warm cloth 2) Apply warm compress 2-3 times a day 3) Start Erythromycin Ointment; take as prescribed 3) Please return or f/u with eye doctor or family doctor for no improvement.  4) Follow up with your primary care doctor in regards to your blood pressure.

## 2017-10-06 NOTE — ED Triage Notes (Signed)
Pt has redness and swelling to right lower eyelid.

## 2017-10-30 ENCOUNTER — Ambulatory Visit (HOSPITAL_COMMUNITY)
Admission: EM | Admit: 2017-10-30 | Discharge: 2017-10-30 | Disposition: A | Discharge status: Home / Self Care | Payer: Self-pay | Attending: Urgent Care | Admitting: Urgent Care

## 2017-10-30 ENCOUNTER — Encounter (HOSPITAL_COMMUNITY): Payer: Self-pay | Admitting: *Deleted

## 2017-10-30 ENCOUNTER — Other Ambulatory Visit: Payer: Self-pay

## 2017-10-30 DIAGNOSIS — I10 Essential (primary) hypertension: Secondary | ICD-10-CM | POA: Insufficient documentation

## 2017-10-30 DIAGNOSIS — E785 Hyperlipidemia, unspecified: Secondary | ICD-10-CM | POA: Insufficient documentation

## 2017-10-30 DIAGNOSIS — Z794 Long term (current) use of insulin: Secondary | ICD-10-CM | POA: Insufficient documentation

## 2017-10-30 DIAGNOSIS — R0789 Other chest pain: Secondary | ICD-10-CM | POA: Insufficient documentation

## 2017-10-30 DIAGNOSIS — M79602 Pain in left arm: Secondary | ICD-10-CM

## 2017-10-30 DIAGNOSIS — Z7982 Long term (current) use of aspirin: Secondary | ICD-10-CM | POA: Insufficient documentation

## 2017-10-30 DIAGNOSIS — R739 Hyperglycemia, unspecified: Secondary | ICD-10-CM

## 2017-10-30 DIAGNOSIS — Z79899 Other long term (current) drug therapy: Secondary | ICD-10-CM | POA: Insufficient documentation

## 2017-10-30 DIAGNOSIS — K219 Gastro-esophageal reflux disease without esophagitis: Secondary | ICD-10-CM | POA: Insufficient documentation

## 2017-10-30 DIAGNOSIS — E1165 Type 2 diabetes mellitus with hyperglycemia: Secondary | ICD-10-CM | POA: Insufficient documentation

## 2017-10-30 LAB — POCT URINALYSIS DIP (DEVICE)
BILIRUBIN URINE: NEGATIVE
Glucose, UA: >=1000 mg/dL — AB
HGB URINE DIPSTICK: NEGATIVE
Ketones, ur: NEGATIVE mg/dL
LEUKOCYTES UA: NEGATIVE
NITRITE: NEGATIVE
Protein, ur: NEGATIVE mg/dL
Urobilinogen, UA: 0.2 mg/dL (ref 0.0–1.0)
pH: 5.5 (ref 5.0–8.0)

## 2017-10-30 LAB — POCT I-STAT, CHEM 8
BUN: 13 mg/dL (ref 6–20)
CHLORIDE: 97 mmol/L — AB (ref 101–111)
Calcium, Ion: 1.16 mmol/L (ref 1.15–1.40)
Creatinine, Ser: 0.7 mg/dL (ref 0.44–1.00)
GLUCOSE: 489 mg/dL — AB (ref 65–99)
HCT: 43 % (ref 36.0–46.0)
Hemoglobin: 14.6 g/dL (ref 12.0–15.0)
POTASSIUM: 3.7 mmol/L (ref 3.5–5.1)
Sodium: 136 mmol/L (ref 135–145)
TCO2: 27 mmol/L (ref 22–32)

## 2017-10-30 NOTE — Discharge Instructions (Addendum)
You may take 500mg  Tylenol every 6 hours for chest wall pain and inflammation. You may also take 400-500mg  naproxen with food twice daily for the same.

## 2017-10-30 NOTE — ED Provider Notes (Signed)
MRN: [id number] DOB: December 08, 1965  Subjective:   Cindy [name] is a 51 y.o. female presenting for sudden onset of left sided chest pain that radiates into left arm and is aggravated by taking a deep breath, moving her left arm or stretching her back. Pain is sharp, intermittent, lasts a few seconds. Associated symptoms include shob, nausea without vomiting. Has a history of DM, HTN, HL, GERD. She manages DM with insulin and metformin, HL with lovastatin, HTN with lisinopril and GERD with omeprazole. Blood sugar is in 200's at home.  She admits compliance with her medications. Denies fever, diaphoresis, abdominal pain, falls, trauma, lower leg swelling. Denies smoking cigarettes.   Current Facility-Administered Medications  Medication Dose Route Frequency Provider Last Rate Last Dose  . 0.9 %  sodium chloride infusion  500 mL Intravenous Continuous Doran Stabler, MD       Current Outpatient Medications  Medication Sig Dispense Refill  . aspirin 325 MG tablet Take 325 mg by mouth daily.    . cyclobenzaprine (FLEXERIL) 10 MG tablet Take 1 tablet (10 mg total) by mouth 2 (two) times daily as needed for muscle spasms. 20 tablet 0  . hydrochlorothiazide (HYDRODIURIL) 25 MG tablet Take 1 tablet (25 mg total) by mouth daily. 30 tablet 0  . ibuprofen (ADVIL,MOTRIN) 200 MG tablet Take 800 mg by mouth every 6 (six) hours as needed for moderate pain.    Marland Kitchen lisinopril (PRINIVIL,ZESTRIL) 20 MG tablet Take 1 tablet (20 mg total) by mouth daily. 30 tablet 0  . lovastatin (MEVACOR) 20 MG tablet Take 20 mg by mouth at bedtime.    . metFORMIN (GLUCOPHAGE) 500 MG tablet Take 1 tablet (500 mg total) by mouth 2 (two) times daily with a meal. 60 tablet 0  . naproxen (NAPROSYN) 500 MG tablet Take 1 tablet (500 mg total) by mouth 2 (two) times daily with a meal. 20 tablet 0  . acetaminophen (TYLENOL) 500 MG tablet Take 1,500 mg by mouth every 6 (six) hours as needed for mild pain.    .  Chlorpheniramine-Acetaminophen (CORICIDIN HBP COLD/FLU PO) Take 2 tablets by mouth daily as needed (cold symptoms).    . cyclobenzaprine (FLEXERIL) 10 MG tablet Take 1 tablet (10 mg total) by mouth 3 (three) times daily as needed for muscle spasms. 12 tablet 0  . erythromycin ophthalmic ointment Place a 1/2 inch ribbon of ointment into the lower eyelid four times a day 3.5 g 0  . HYDROcodone-acetaminophen (NORCO) 5-325 MG tablet Take 1 tablet by mouth every 4 (four) hours as needed for moderate pain. 6 tablet 0  . insulin detemir (LEVEMIR) 100 UNIT/ML injection Inject 0.4 mLs (40 Units total) into the skin at bedtime. 10 mL 0     Cindy [name] is allergic to other.  Cindy [name]  has a past medical history of Diabetes mellitus, GERD (gastroesophageal reflux disease), Hyperlipemia, and Hypertension. Also  has a past surgical history that includes None.  Objective:   Vitals: BP 112/71   Pulse 93   Temp 97.8 F (36.6 C) (Oral)   Resp 14   SpO2 99%   Physical Exam  Constitutional: She is oriented to person, place, and time. She appears well-developed and well-nourished.  HENT:  Mouth/Throat: Oropharynx is clear and moist.  Eyes: No scleral icterus.  Cardiovascular: Normal rate, regular rhythm and intact distal pulses. Exam reveals no gallop and no friction rub.  No murmur heard. Pulmonary/Chest: No respiratory distress. She has no wheezes. She has no rales.  She exhibits tenderness (reproducible over area depicted).    Abdominal: Soft. Bowel sounds are normal. She exhibits no distension and no mass. There is no tenderness. There is no guarding.  Musculoskeletal: She exhibits no edema.  Neurological: She is alert and oriented to person, place, and time.  Skin: Skin is warm and dry.  Psychiatric: She has a normal mood and affect.   Results for orders placed or performed during the hospital encounter of 10/30/17 (from the past 24 hour(s))  POCT urinalysis dip (device)     Status: Abnormal    Collection Time: 10/30/17  8:03 PM  Result Value Ref Range   Glucose, UA >=1000 (A) NEGATIVE mg/dL   Bilirubin Urine NEGATIVE NEGATIVE   Ketones, ur NEGATIVE NEGATIVE mg/dL   Specific Gravity, Urine <=1.005 1.005 - 1.030   Hgb urine dipstick NEGATIVE NEGATIVE   pH 5.5 5.0 - 8.0   Protein, ur NEGATIVE NEGATIVE mg/dL   Urobilinogen, UA 0.2 0.0 - 1.0 mg/dL   Nitrite NEGATIVE NEGATIVE   Leukocytes, UA NEGATIVE NEGATIVE  I-STAT, chem 8     Status: Abnormal   Collection Time: 10/30/17  8:17 PM  Result Value Ref Range   Sodium 136 135 - 145 mmol/L   Potassium 3.7 3.5 - 5.1 mmol/L   Chloride 97 (L) 101 - 111 mmol/L   BUN 13 6 - 20 mg/dL   Creatinine, Ser 0.70 0.44 - 1.00 mg/dL   Glucose, Bld 489 (H) 65 - 99 mg/dL   Calcium, Ion 1.16 1.15 - 1.40 mmol/L   TCO2 27 22 - 32 mmol/L   Hemoglobin 14.6 12.0 - 15.0 g/dL   HCT 43.0 36.0 - 46.0 %   ED ECG REPORT   Date: 10/30/2017  Rate: 86  Rhythm: normal sinus rhythm  QRS Axis: normal  Intervals: QT prolonged  ST/T Wave abnormalities: normal  Conduction Disutrbances:none  Narrative Interpretation:   Old EKG Reviewed: very comparable to ecg from 06/24/2017  I have personally reviewed the EKG tracing and agree with the computerized printout as noted.   Assessment and Plan :   Atypical chest pain  Uncontrolled type 2 diabetes mellitus with hyperglycemia (HCC)  Ecg, physical exam findings reassuring. For now, we will manage this as musculoskeletal type pain, patient will take double her normal omeprazole dose to address GERD. Counseled patient on risks of uncontrolled type 2 diabetes. Return-to-clinic and ER precautions discussed, patient verbalized understanding. Follow up with PCP for diabetes management.  Jaynee Eagles, PA-C [facility]  10/30/2017  7:42 PM    Jaynee Eagles, PA-C 10/30/17 2036

## 2017-10-30 NOTE — ED Triage Notes (Signed)
C/O left chest pain radiating down into left arm x 30 min with SOB.  Denies n/v, diaphoresis.  Pain is worse with deep breathing, movement and palpation.

## 2017-10-30 NOTE — ED Notes (Signed)
EKG handed to Jaynee Eagles

## 2018-02-08 ENCOUNTER — Ambulatory Visit (HOSPITAL_COMMUNITY)
Admission: EM | Admit: 2018-02-08 | Discharge: 2018-02-08 | Disposition: A | Discharge status: Home / Self Care | Payer: BLUE CROSS/BLUE SHIELD | Attending: Family Medicine | Admitting: Family Medicine

## 2018-02-08 ENCOUNTER — Encounter (HOSPITAL_COMMUNITY): Payer: Self-pay | Admitting: Emergency Medicine

## 2018-02-08 DIAGNOSIS — R300 Dysuria: Secondary | ICD-10-CM | POA: Diagnosis not present

## 2018-02-08 DIAGNOSIS — R350 Frequency of micturition: Secondary | ICD-10-CM

## 2018-02-08 DIAGNOSIS — M62838 Other muscle spasm: Secondary | ICD-10-CM | POA: Diagnosis not present

## 2018-02-08 DIAGNOSIS — M1990 Unspecified osteoarthritis, unspecified site: Secondary | ICD-10-CM

## 2018-02-08 LAB — POCT URINALYSIS DIP (DEVICE)
Bilirubin Urine: NEGATIVE
Hgb urine dipstick: NEGATIVE
KETONES UR: NEGATIVE mg/dL
Leukocytes, UA: NEGATIVE
Nitrite: NEGATIVE
PROTEIN: NEGATIVE mg/dL
SPECIFIC GRAVITY, URINE: 1.015 (ref 1.005–1.030)
UROBILINOGEN UA: 0.2 mg/dL (ref 0.0–1.0)
pH: 5 (ref 5.0–8.0)

## 2018-02-08 MED ORDER — PERMETHRIN 5 % EX CREA: TOPICAL_CREAM | CUTANEOUS | 1 refills | Status: DC

## 2018-02-08 MED ORDER — DICLOFENAC SODIUM 75 MG PO TBEC: 75.0000 mg | DELAYED_RELEASE_TABLET | Freq: Two times a day (BID) | ORAL | 0 refills | Status: DC

## 2018-02-08 MED ORDER — CYCLOBENZAPRINE HCL 10 MG PO TABS: 10.0000 mg | ORAL_TABLET | Freq: Three times a day (TID) | ORAL | 0 refills | Status: DC

## 2018-02-08 NOTE — ED Provider Notes (Signed)
[place]   [id number] 02/08/18 Arrival Time: 4854  ASSESSMENT & PLAN:  1. Muscle spasm   2. Arthritis   3. Urinary frequency    Meds ordered this encounter  Medications  . cyclobenzaprine (FLEXERIL) 10 MG tablet    Sig: Take 1 tablet (10 mg total) by mouth 3 (three) times daily.    Dispense:  20 tablet    Refill:  0  . diclofenac (VOLTAREN) 75 MG EC tablet    Sig: Take 1 tablet (75 mg total) by mouth 2 (two) times daily.    Dispense:  14 tablet    Refill:  0   >1000 glucose on urine. Likely the cause of her urinary frequency. Reports blood sugars at home are running in the low 200s. Taking her medications as directed. Stay mobile. Medication sedation precautions given. Has f/u with her PCP scheduled. May f/u here as needed.  Reviewed expectations re: course of current medical issues. Questions answered. Outlined signs and symptoms indicating need for more acute intervention. Patient verbalized understanding. After Visit Summary given.  SUBJECTIVE: History from: patient. Cindy [name] is a 52 y.o. female who reports "arthritis in hands". Intermittent over the past week. "Kind of ache and my finger joints hurt." No recent illnesses. Afebrile. Lower back with "spasm". Occasionally has experienced similar in the past. No specific aggravating or alleviating factors reported. Also reports urinary frequency over the past few weeks. Sporadic. No dysuria. Afebrile. No abdominal or pelvic discomfort. No hematuria. No OTC treatment. ROS: As per HPI.   OBJECTIVE:  Vitals:   02/08/18 1134  BP: (!) 142/74  Pulse: 83  Resp: 18  Temp: 98 F (36.7 C)  SpO2: 98%    General appearance: alert; no distress Extremities: no cyanosis or edema; symmetrical with no gross deformities; bilateral hands with tenderness over MCP joints; no swelling; FROM of all fingers Back: mild lower back paraspinal musculature tenderness with FROM at hips Abd: benign Skin: warm and  dry Neurologic: normal gait; normal symmetric reflexes in all extremities; normal sensation in all extremities Psychological: alert and cooperative; normal mood and affect  Allergies  Allergen Reactions  . Other     Hives to an unknown medication given in 2012 at [facility]     Past Medical History:  Diagnosis Date  . Diabetes mellitus   . GERD (gastroesophageal reflux disease)   . Hyperlipemia   . Hypertension    Social History   Socioeconomic History  . Marital status: Single    Spouse name: Not on file  . Number of children: 2  . Years of education: Not on file  . Highest education level: Not on file  Social Needs  . Financial resource strain: Not on file  . Food insecurity - worry: Not on file  . Food insecurity - inability: Not on file  . Transportation needs - medical: Not on file  . Transportation needs - non-medical: Not on file  Occupational History  . 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 Smoking status: Never Smoker  . Smokeless tobacco: Never Used  Substance and Sexual Activity  . Alcohol use: No  . Drug use: No  . Sexual activity: No  Other Topics Concern  . Not on file  Social History Narrative  . Not on file   Family History  Problem Relation Age of Onset  . Diabetes Mother   . Hypertension Mother   . Breast cancer Mother   . Hypertension Father   .  Heart failure Father   . Breast cancer Maternal Grandmother   . Colon cancer Neg Hx   . Stomach cancer Neg Hx   . Rectal cancer Neg Hx   . Esophageal cancer Neg Hx   . Liver cancer Neg Hx    Past Surgical History:  Procedure Laterality Date  . None        Vanessa Kick, MD 02/16/18 [phone]

## 2018-02-08 NOTE — ED Triage Notes (Signed)
Pt c/o generalized body aches x1 week, denies fevers. Pt also endorses burning with urination.

## 2018-04-03 ENCOUNTER — Encounter (HOSPITAL_COMMUNITY): Payer: Self-pay

## 2018-04-03 ENCOUNTER — Other Ambulatory Visit: Payer: Self-pay

## 2018-04-03 ENCOUNTER — Emergency Department (HOSPITAL_COMMUNITY)
Admission: EM | Admit: 2018-04-03 | Discharge: 2018-04-03 | Disposition: A | Discharge status: Home / Self Care | Payer: BLUE CROSS/BLUE SHIELD | Attending: Emergency Medicine | Admitting: Emergency Medicine

## 2018-04-03 DIAGNOSIS — Z79899 Other long term (current) drug therapy: Secondary | ICD-10-CM | POA: Diagnosis not present

## 2018-04-03 DIAGNOSIS — Z7982 Long term (current) use of aspirin: Secondary | ICD-10-CM | POA: Diagnosis not present

## 2018-04-03 DIAGNOSIS — L02415 Cutaneous abscess of right lower limb: Secondary | ICD-10-CM

## 2018-04-03 DIAGNOSIS — R739 Hyperglycemia, unspecified: Secondary | ICD-10-CM | POA: Insufficient documentation

## 2018-04-03 DIAGNOSIS — M65051 Abscess of tendon sheath, right thigh: Secondary | ICD-10-CM | POA: Diagnosis not present

## 2018-04-03 DIAGNOSIS — Z7984 Long term (current) use of oral hypoglycemic drugs: Secondary | ICD-10-CM | POA: Insufficient documentation

## 2018-04-03 DIAGNOSIS — E119 Type 2 diabetes mellitus without complications: Secondary | ICD-10-CM | POA: Insufficient documentation

## 2018-04-03 DIAGNOSIS — I10 Essential (primary) hypertension: Secondary | ICD-10-CM | POA: Insufficient documentation

## 2018-04-03 LAB — CBC WITH DIFFERENTIAL/PLATELET
BASOS ABS: 0 10*3/uL (ref 0.0–0.1)
Basophils Relative: 0 %
EOS ABS: 0.1 10*3/uL (ref 0.0–0.7)
Eosinophils Relative: 2 %
HCT: 39.4 % (ref 36.0–46.0)
HEMOGLOBIN: 12.8 g/dL (ref 12.0–15.0)
LYMPHS PCT: 33 %
Lymphs Abs: 2.9 10*3/uL (ref 0.7–4.0)
MCH: 31.7 pg (ref 26.0–34.0)
MCHC: 32.5 g/dL (ref 30.0–36.0)
MCV: 97.5 fL (ref 78.0–100.0)
Monocytes Absolute: 0.6 10*3/uL (ref 0.1–1.0)
Monocytes Relative: 7 %
NEUTROS PCT: 58 %
Neutro Abs: 5.1 10*3/uL (ref 1.7–7.7)
Platelets: 357 10*3/uL (ref 150–400)
RBC: 4.04 MIL/uL (ref 3.87–5.11)
RDW: 13.1 % (ref 11.5–15.5)
WBC: 8.8 10*3/uL (ref 4.0–10.5)

## 2018-04-03 LAB — BASIC METABOLIC PANEL
ANION GAP: 11 (ref 5–15)
BUN: 10 mg/dL (ref 6–20)
CHLORIDE: 102 mmol/L (ref 101–111)
CO2: 23 mmol/L (ref 22–32)
Calcium: 8.9 mg/dL (ref 8.9–10.3)
Creatinine, Ser: 0.67 mg/dL (ref 0.44–1.00)
GFR calc Af Amer: >60 mL/min (ref >60)
Glucose, Bld: 419 mg/dL — ABNORMAL HIGH (ref 65–99)
POTASSIUM: 3.9 mmol/L (ref 3.5–5.1)
SODIUM: 136 mmol/L (ref 135–145)

## 2018-04-03 LAB — CBG MONITORING, ED
GLUCOSE-CAPILLARY: 404 mg/dL — AB (ref 65–99)
GLUCOSE-CAPILLARY: 305 mg/dL — AB (ref 65–99)

## 2018-04-03 MED ORDER — CEPHALEXIN 500 MG PO CAPS: 500.0000 mg | ORAL_CAPSULE | Freq: Three times a day (TID) | ORAL | 0 refills | Status: DC

## 2018-04-03 MED ORDER — SODIUM CHLORIDE 0.9 % IV BOLUS
1000.0000 mL | Freq: Once | INTRAVENOUS | Status: AC
  Administered 2018-04-03: 1000 mL via INTRAVENOUS

## 2018-04-03 MED ORDER — SULFAMETHOXAZOLE-TRIMETHOPRIM 800-160 MG PO TABS: 1.0000 | ORAL_TABLET | Freq: Two times a day (BID) | ORAL | 0 refills | Status: AC

## 2018-04-03 MED ORDER — LIDOCAINE HCL (PF) 1 % IJ SOLN
2.0000 mL | Freq: Once | INTRAMUSCULAR | Status: AC
  Administered 2018-04-03: 2 mL
  Filled 2018-04-03: qty 30

## 2018-04-03 NOTE — Discharge Instructions (Addendum)
Call your doctor about possible medication adjustments.  Continue to check your sugars.  Return for worsening infection

## 2018-04-03 NOTE — ED Provider Notes (Signed)
[facility] DEPT Provider Note   CSN: [id number] Arrival date & time: 04/03/18  0906     History   Chief Complaint Chief Complaint  Patient presents with  . Abscess    HPI Cindy [name] is a 52 y.o. female.  HPI Patient presents with right thigh abscess.  Has had for the last few days.  Has had drainage out of the wound.  It is painful. No fevers.  Has not had abscesses previously.  Does have history of diabetes although she has not seen her provider in a while.  She is still is on metformin.  Has had urinary frequency.  States for the last couple months she is been getting up at night to urinate.  Has testing supplies but does not check her sugar very often at home. Past Medical History:  Diagnosis Date  . Diabetes mellitus   . GERD (gastroesophageal reflux disease)   . Hyperlipemia   . Hypertension     Patient Active Problem List   Diagnosis Date Noted  . OBESITY 07/01/2010  . Type II or unspecified type diabetes mellitus without mention of complication, not stated as uncontrolled 05/08/2008  . HYPERLIPIDEMIA 05/08/2008  . HYPERTENSION 05/08/2008    Past Surgical History:  Procedure Laterality Date  . None       OB History   None      Home Medications    Prior to Admission medications   Medication Sig Start Date End Date Taking? Authorizing Provider  acetaminophen (TYLENOL) 500 MG tablet Take 1,500 mg by mouth every 6 (six) hours as needed for mild pain.   Yes [provider]  aspirin 325 MG tablet Take 325 mg by mouth daily.   Yes [provider]  cyclobenzaprine (FLEXERIL) 10 MG tablet Take 1 tablet (10 mg total) by mouth 3 (three) times daily. Patient taking differently: Take 10 mg by mouth 3 (three) times daily as needed for muscle spasms.  02/08/18  Yes Vanessa Kick, MD  diclofenac (VOLTAREN) 75 MG EC tablet Take 1 tablet (75 mg total) by mouth 2 (two) times daily. Patient taking differently: Take 75  mg by mouth 2 (two) times daily as needed for mild pain.  02/08/18  Yes Hagler, Aaron Edelman, MD  hydrochlorothiazide (HYDRODIURIL) 25 MG tablet Take 1 tablet (25 mg total) by mouth daily. 03/15/16  Yes Leonard Schwartz, MD  insulin detemir (LEVEMIR) 100 UNIT/ML injection Inject 0.4 mLs (40 Units total) into the skin at bedtime. 03/15/16  Yes Leonard Schwartz, MD  lisinopril (PRINIVIL,ZESTRIL) 20 MG tablet Take 1 tablet (20 mg total) by mouth daily. 03/15/16  Yes Leonard Schwartz, MD  lovastatin (MEVACOR) 20 MG tablet Take 20 mg by mouth at bedtime.   Yes [provider]  metFORMIN (GLUCOPHAGE) 1000 MG tablet Take 1,000 mg by mouth 2 (two) times daily with a meal.   Yes [provider]  cephALEXin (KEFLEX) 500 MG capsule Take 1 capsule (500 mg total) by mouth 3 (three) times daily. 04/03/18   Davonna Belling, MD  metFORMIN (GLUCOPHAGE) 500 MG tablet Take 1 tablet (500 mg total) by mouth 2 (two) times daily with a meal. Patient not taking: Reported on 04/03/2018 03/15/16   Leonard Schwartz, MD  naproxen (NAPROSYN) 500 MG tablet Take 1 tablet (500 mg total) by mouth 2 (two) times daily with a meal. Patient not taking: Reported on 02/08/2018 06/24/17   Lysbeth Penner, FNP  sulfamethoxazole-trimethoprim (BACTRIM DS,SEPTRA DS) 800-160 MG tablet Take 1 tablet by  mouth 2 (two) times daily for 5 days. 04/03/18 04/08/18  Davonna Belling, MD    Family History Family History  Problem Relation Age of Onset  . Diabetes Mother   . Hypertension Mother   . Breast cancer Mother   . Hypertension Father   . Heart failure Father   . Breast cancer Maternal Grandmother   . Colon cancer Neg Hx   . Stomach cancer Neg Hx   . Rectal cancer Neg Hx   . Esophageal cancer Neg Hx   . Liver cancer Neg Hx     Social History Social History   Tobacco Use  . Smoking status: Never Smoker  . Smokeless tobacco: Never Used  Substance Use Topics  . Alcohol use: No  . Drug use: No     Allergies   Other   Review of  Systems Review of Systems  Constitutional: Negative for appetite change and fever.  HENT: Negative for congestion.   Respiratory: Negative for shortness of breath.   Cardiovascular: Negative for chest pain.  Gastrointestinal: Negative for abdominal pain.  Endocrine: Positive for polydipsia and polyuria. Negative for polyphagia.  Musculoskeletal: Negative for back pain.  Skin: Positive for wound.  Neurological: Negative for weakness.  Hematological: Negative for adenopathy.  Psychiatric/Behavioral: Negative for agitation.     Physical Exam Updated Vital Signs BP (!) 175/85 (BP Location: Right Arm)   Pulse 74   Temp 98 F (36.7 C) (Oral)   Resp 20   Ht 5\' 3"  (1.6 m)   Wt 108.9 kg (240 lb)   SpO2 99%   BMI 42.51 kg/m   Physical Exam  Constitutional: She appears well-developed.  HENT:  Head: Atraumatic.  Eyes: Pupils are equal, round, and reactive to light.  Neck: Neck supple.  Cardiovascular: Normal rate.  Pulmonary/Chest: Effort normal.  Abdominal: Soft.  Musculoskeletal: She exhibits tenderness.  Right thigh 3 cm tender indurated mass with central ulcer that has purulent drainage out of the middle.  Skin: Skin is warm. Capillary refill takes less than 2 seconds.     ED Treatments / Results  Labs (all labs ordered are listed, but only abnormal results are displayed) Labs Reviewed  BASIC METABOLIC PANEL - Abnormal; Notable for the following components:      Result Value   Glucose, Bld 419 (*)    All other components within normal limits  CBG MONITORING, ED - Abnormal; Notable for the following components:   Glucose-Capillary 404 (*)    All other components within normal limits  CBG MONITORING, ED - Abnormal; Notable for the following components:   Glucose-Capillary 305 (*)    All other components within normal limits  CBC WITH DIFFERENTIAL/PLATELET    EKG None  Radiology No results found.  Procedures Procedures (including critical care  time)  Medications Ordered in ED Medications  sodium chloride 0.9 % bolus 1,000 mL (0 mLs Intravenous Stopped 04/03/18 1613)  lidocaine (PF) (XYLOCAINE) 1 % injection 2 mL (2 mLs Infiltration Given 04/03/18 1456)     Initial Impression / Assessment and Plan / ED Course  I have reviewed the triage vital signs and the nursing notes.  Pertinent labs & imaging results that were available during my care of the patient were reviewed by me and considered in my medical decision making (see chart for details).     Patient with thigh abscess. Wound opened, but will give abx. Hyperglycemia likely worsened by infection. Will d/c with follow up with PCP. PCP may change medications.  Final Clinical Impressions(s) / ED Diagnoses   Final diagnoses:  Hyperglycemia  Abscess of right thigh    ED Discharge Orders        Ordered    cephALEXin (KEFLEX) 500 MG capsule  3 times daily     04/03/18 1557    sulfamethoxazole-trimethoprim (BACTRIM DS,SEPTRA DS) 800-160 MG tablet  2 times daily     04/03/18 1557       Davonna Belling, MD 04/03/18 1625

## 2018-04-03 NOTE — ED Triage Notes (Signed)
Patient c/o right groin abscess x 3 days.

## 2018-04-03 NOTE — ED Notes (Signed)
Bed: WTR6 Expected date:  Expected time:  Means of arrival:  Comments: 

## 2018-04-03 NOTE — ED Provider Notes (Signed)
MSE was initiated and I personally evaluated the patient and placed orders (if any) at  12:13 PM on Apr 03, 2018.  Cindy [name] is a 52 y.o. female with hx of diabetes but reports she has not seen her PCP in a long while, presents to the ED with c/o an abscess to the right inner aspect of the thigh. The area started 3 days ago and has increased in size rapidly. Patient reports the area is very painful.   BP (!) 144/87 (BP Location: Right Arm)   Pulse 87   Temp 98.3 F (36.8 C) (Oral)   Resp 18   Ht 5\' 3"  (1.6 m)   Wt 108.9 kg (240 lb)   SpO2 92%   BMI 42.51 kg/m   Results for orders placed or performed during the hospital encounter of 04/03/18 (from the past 24 hour(s))  POC CBG, ED     Status: Abnormal   Collection Time: 04/03/18 12:11 PM  Result Value Ref Range   Glucose-Capillary 404 (H) 65 - 99 mg/dL   Exam: there is an area to the right inner thigh approximately 3 cm that is tender and the center is open with drainage. There is a tender firm area surrounding the open wound.   The patient appears stable so that the remainder of the MSE may be completed by another provider.   Debroah Baller [place], [place] 04/03/18 1217    Davonna Belling, MD 04/03/18 [phone]

## 2018-04-03 NOTE — ED Notes (Signed)
ED Provider at bedside. 

## 2018-06-20 ENCOUNTER — Ambulatory Visit (HOSPITAL_COMMUNITY)
Admission: EM | Admit: 2018-06-20 | Discharge: 2018-06-20 | Disposition: A | Discharge status: Home / Self Care | Payer: BLUE CROSS/BLUE SHIELD | Attending: Family Medicine | Admitting: Family Medicine

## 2018-06-20 ENCOUNTER — Ambulatory Visit (INDEPENDENT_AMBULATORY_CARE_PROVIDER_SITE_OTHER): Payer: BLUE CROSS/BLUE SHIELD

## 2018-06-20 ENCOUNTER — Encounter (HOSPITAL_COMMUNITY): Payer: Self-pay

## 2018-06-20 DIAGNOSIS — R079 Chest pain, unspecified: Secondary | ICD-10-CM

## 2018-06-20 DIAGNOSIS — R0602 Shortness of breath: Secondary | ICD-10-CM

## 2018-06-20 DIAGNOSIS — R52 Pain, unspecified: Secondary | ICD-10-CM

## 2018-06-20 DIAGNOSIS — R0789 Other chest pain: Secondary | ICD-10-CM

## 2018-06-20 LAB — POCT I-STAT, CHEM 8
BUN: 13 mg/dL (ref 6–20)
CHLORIDE: 101 mmol/L (ref 98–111)
Calcium, Ion: 1.13 mmol/L — ABNORMAL LOW (ref 1.15–1.40)
Creatinine, Ser: 0.6 mg/dL (ref 0.44–1.00)
Glucose, Bld: 146 mg/dL — ABNORMAL HIGH (ref 70–99)
HEMATOCRIT: 37 % (ref 36.0–46.0)
Hemoglobin: 12.6 g/dL (ref 12.0–15.0)
POTASSIUM: 4 mmol/L (ref 3.5–5.1)
Sodium: 140 mmol/L (ref 135–145)
TCO2: 29 mmol/L (ref 22–32)

## 2018-06-20 LAB — POCT URINALYSIS DIP (DEVICE)
BILIRUBIN URINE: NEGATIVE
Glucose, UA: NEGATIVE mg/dL
Hgb urine dipstick: NEGATIVE
KETONES UR: NEGATIVE mg/dL
Leukocytes, UA: NEGATIVE
Nitrite: POSITIVE — AB
PROTEIN: 30 mg/dL — AB
SPECIFIC GRAVITY, URINE: 1.02 (ref 1.005–1.030)
Urobilinogen, UA: 1 mg/dL (ref 0.0–1.0)
pH: 6.5 (ref 5.0–8.0)

## 2018-06-20 MED ORDER — PREDNISONE 20 MG PO TABS: 20.0000 mg | ORAL_TABLET | Freq: Two times a day (BID) | ORAL | 0 refills | Status: DC

## 2018-06-20 NOTE — ED Provider Notes (Signed)
[facility]    CSN: [id number] Arrival date & time: 06/20/18  1046     History   Chief Complaint Chief Complaint  Patient presents with  . Generalized Body Pain    HPI Cindy [name] is a 52 y.o. female.   HPI  Patient presented to the urgent care center with a complaint of "general body pain".  She states she has pain in her neck, back, arms and legs.  All of her muscles are sore.  No new activity, no injury.  No new medications or supplements.  No recent illness or fever.  No rash.  As I enter the room she tells me her chief complaint is "chest pressure ".  It radiates up into her neck.  She states her neck feels tight and it feels like she cannot swallow.  He is eating and drinking normally.  She is breathing normally.  She states that her chest is worse if she tries to take a deep breath.  No history of chest pain, no history of lung disease or asthma, no history of DVT or clots.  No pain specifically in the calves or swelling in the ankles.  No recent travel.  She states last time she had chest pain that was similar to this she was worked up and told that it was "stress". Patient is a insulin-dependent diabetic.  She states her sugars have been "good".  States her blood pressure is usually well controlled. No recent insect or tick bite. No known sick exposure. She states activity makes her chest pressure worse, makes her feel short of breath.  No diaphoresis.  No radiation or arm pain.  She states the pain comes in "waves".  She describes it as a "tightness" or a "pressure".  Patient is here with her adult daughter Past Medical History:  Diagnosis Date  . Diabetes mellitus   . GERD (gastroesophageal reflux disease)   . Hyperlipemia   . Hypertension     Patient Active Problem List   Diagnosis Date Noted  . OBESITY 07/01/2010  . Type II or unspecified type diabetes mellitus without mention of complication, not stated as uncontrolled 05/08/2008  .  HYPERLIPIDEMIA 05/08/2008  . HYPERTENSION 05/08/2008    Past Surgical History:  Procedure Laterality Date  . None      OB History   None      Home Medications    Prior to Admission medications   Medication Sig Start Date End Date Taking? Authorizing Provider  acetaminophen (TYLENOL) 500 MG tablet Take 1,500 mg by mouth every 6 (six) hours as needed for mild pain.    [provider]  aspirin 325 MG tablet Take 325 mg by mouth daily.    [provider]  hydrochlorothiazide (HYDRODIURIL) 25 MG tablet Take 1 tablet (25 mg total) by mouth daily. 03/15/16   Leonard Schwartz, MD  insulin detemir (LEVEMIR) 100 UNIT/ML injection Inject 0.4 mLs (40 Units total) into the skin at bedtime. 03/15/16   Leonard Schwartz, MD  lisinopril (PRINIVIL,ZESTRIL) 20 MG tablet Take 1 tablet (20 mg total) by mouth daily. 03/15/16   Leonard Schwartz, MD  lovastatin (MEVACOR) 20 MG tablet Take 20 mg by mouth at bedtime.    [provider]  metFORMIN (GLUCOPHAGE) 1000 MG tablet Take 1,000 mg by mouth 2 (two) times daily with a meal.    [provider]  predniSONE (DELTASONE) 20 MG tablet Take 1 tablet (20 mg total) by mouth 2 (two) times daily with a  meal. 06/20/18   Raylene Everts, MD    Family History Family History  Problem Relation Age of Onset  . Diabetes Mother   . Hypertension Mother   . Breast cancer Mother   . Hypertension Father   . Heart failure Father   . Breast cancer Maternal Grandmother   . Colon cancer Neg Hx   . Stomach cancer Neg Hx   . Rectal cancer Neg Hx   . Esophageal cancer Neg Hx   . Liver cancer Neg Hx     Social History Social History   Tobacco Use  . Smoking status: Never Smoker  . Smokeless tobacco: Never Used  Substance Use Topics  . Alcohol use: No  . Drug use: No     Allergies   Other   Review of Systems Review of Systems  Constitutional: Positive for fatigue. Negative for activity change, appetite change, chills,  diaphoresis and fever.  HENT: Negative for congestion, dental problem, ear pain, sore throat and trouble swallowing.   Eyes: Negative for pain and visual disturbance.  Respiratory: Positive for chest tightness. Negative for cough and shortness of breath.   Cardiovascular: Negative for chest pain and palpitations.  Gastrointestinal: Negative for abdominal pain and vomiting.  Genitourinary: Negative for dysuria and hematuria.  Musculoskeletal: Positive for myalgias. Negative for arthralgias and back pain.  Skin: Negative for color change and rash.  Neurological: Negative for seizures and syncope.  Psychiatric/Behavioral: Negative for decreased concentration. The patient is not nervous/anxious.        Denies feeling increased stress or anxiety  All other systems reviewed and are negative.    Physical Exam Triage Vital Signs ED Triage Vitals [06/20/18 1111]  Enc Vitals Group     BP (!) 166/91     Pulse Rate 75     Resp 20     Temp 98.8 F (37.1 C)     Temp Source Oral     SpO2 100 %     Weight      Height      Head Circumference      Peak Flow      Pain Score 9     Pain Loc      Pain Edu?      Excl. in [place]?    No data found.  Updated Vital Signs BP (!) 166/91 (BP Location: Right Arm)   Pulse 75   Temp 98.8 F (37.1 C) (Oral)   Resp 20   SpO2 100%      Physical Exam  Constitutional: She appears well-developed and well-nourished. She appears distressed.  Appears uncomfortable.  Moves slowly.  HENT:  Head: Normocephalic and atraumatic.  Right Ear: External ear normal.  Left Ear: External ear normal.  Nose: Nose normal.  Mouth/Throat: Oropharynx is clear and moist.  Eyes: Pupils are equal, round, and reactive to light. Conjunctivae and EOM are normal.  Neck: Normal range of motion. Neck supple.  Cardiovascular: Normal rate, regular rhythm and normal heart sounds.  Pulmonary/Chest: Effort normal and breath sounds normal. No respiratory distress. She has no wheezes.  She has no rales.  No chest wall tenderness  Abdominal: Soft. Bowel sounds are normal. She exhibits no distension.  Musculoskeletal: Normal range of motion. She exhibits no edema.  General mild tenderness to muscles palpated, quadriceps, gastrocnemius, deltoid, forearm.  No deep tenderness in the gastroc muscles/calf area.  No erythema or edema  Lymphadenopathy:    She has no cervical adenopathy.  Neurological: She is alert.  Skin: Skin is warm and dry.     UC Treatments / Results  Labs (all labs ordered are listed, but only abnormal results are displayed) Labs Reviewed  POCT I-STAT, CHEM 8 - Abnormal; Notable for the following components:      Result Value   Glucose, Bld 146 (*)    Calcium, Ion 1.13 (*)    All other components within normal limits  POCT URINALYSIS DIP (DEVICE) - Abnormal; Notable for the following components:   Protein, ur 30 (*)    Nitrite POSITIVE (*)    All other components within normal limits    EKG None  Radiology Dg Chest 2 View  Result Date: 06/20/2018 CLINICAL DATA:  Two weeks of chest pain and shortness of breath EXAM: CHEST - 2 VIEW COMPARISON:  Chest x-ray of October 31, 2015 FINDINGS: The lungs are borderline hypoinflated but clear. The heart and pulmonary vascularity are normal. The mediastinum is normal in width. The trachea is midline. The bony thorax exhibits no acute abnormality. IMPRESSION: There is no acute cardiopulmonary abnormality. Electronically Signed   By: David  Martinique M.D.   On: 06/20/2018 12:06    Procedures Procedures (including critical care time)  Medications Ordered in UC Medications - No data to display  Initial Impression / Assessment and Plan / UC Course  I have reviewed the triage vital signs and the nursing notes.  Pertinent labs & imaging results that were available during my care of the patient were reviewed by me and considered in my medical decision making (see chart for details).     Discussed with patient  that her chest x-ray was normal.  EKG is normal.  Chem-8 is normal.  Urinalysis is positive nitrates, patient has no urinary frequency or symptoms.  We will culture her urine and call her if it is positive.  We did treat her with 5 days of prednisone to see if this takes down her sensation of body aches.  She understands that she needs to be very careful watching her blood sugars while she is on this medication.  Follow-up with her PCP. Final Clinical Impressions(s) / UC Diagnoses   Final diagnoses:  Generalized body aches  Sensation of chest pressure     Discharge Instructions     Take the prednisone as directed Watch the sugar while on the prednisone , be very careful with carbs and sweets in your diet Follow up with your PCP if this persists Come back to ER/Urgent care if worse    ED Prescriptions    Medication Sig Dispense Auth. Provider   predniSONE (DELTASONE) 20 MG tablet Take 1 tablet (20 mg total) by mouth 2 (two) times daily with a meal. 10 tablet Raylene Everts, MD     Controlled Substance Prescriptions [place] Controlled Substance Registry consulted? Not Applicable  Raylene Everts, MD 06/20/18 [phone]

## 2018-06-20 NOTE — ED Triage Notes (Signed)
Pt presents with generalized body pain

## 2018-06-20 NOTE — Discharge Instructions (Signed)
Take the prednisone as directed Watch the sugar while on the prednisone , be very careful with carbs and sweets in your diet Follow up with your PCP if this persists Come back to ER/Urgent care if worse

## 2018-08-18 ENCOUNTER — Emergency Department (HOSPITAL_COMMUNITY): Payer: BLUE CROSS/BLUE SHIELD

## 2018-08-18 ENCOUNTER — Encounter (HOSPITAL_COMMUNITY): Payer: Self-pay | Admitting: Emergency Medicine

## 2018-08-18 ENCOUNTER — Observation Stay (HOSPITAL_COMMUNITY)
Admission: EM | Admit: 2018-08-18 | Discharge: 2018-08-20 | Disposition: A | Discharge status: Home / Self Care | Payer: BLUE CROSS/BLUE SHIELD | Attending: Internal Medicine | Admitting: Internal Medicine

## 2018-08-18 DIAGNOSIS — E119 Type 2 diabetes mellitus without complications: Secondary | ICD-10-CM | POA: Insufficient documentation

## 2018-08-18 DIAGNOSIS — I10 Essential (primary) hypertension: Secondary | ICD-10-CM | POA: Insufficient documentation

## 2018-08-18 DIAGNOSIS — I639 Cerebral infarction, unspecified: Principal | ICD-10-CM | POA: Insufficient documentation

## 2018-08-18 DIAGNOSIS — Z794 Long term (current) use of insulin: Secondary | ICD-10-CM

## 2018-08-18 DIAGNOSIS — R51 Headache: Secondary | ICD-10-CM | POA: Diagnosis present

## 2018-08-18 DIAGNOSIS — Z7982 Long term (current) use of aspirin: Secondary | ICD-10-CM | POA: Insufficient documentation

## 2018-08-18 DIAGNOSIS — Z7984 Long term (current) use of oral hypoglycemic drugs: Secondary | ICD-10-CM | POA: Insufficient documentation

## 2018-08-18 DIAGNOSIS — Z6841 Body Mass Index (BMI) 40.0 and over, adult: Secondary | ICD-10-CM

## 2018-08-18 DIAGNOSIS — Z79899 Other long term (current) drug therapy: Secondary | ICD-10-CM | POA: Insufficient documentation

## 2018-08-18 DIAGNOSIS — E118 Type 2 diabetes mellitus with unspecified complications: Secondary | ICD-10-CM

## 2018-08-18 DIAGNOSIS — Z8673 Personal history of transient ischemic attack (TIA), and cerebral infarction without residual deficits: Secondary | ICD-10-CM

## 2018-08-18 LAB — I-STAT CHEM 8, ED
BUN: 22 mg/dL — ABNORMAL HIGH (ref 6–20)
CREATININE: 0.9 mg/dL (ref 0.44–1.00)
Calcium, Ion: 1.06 mmol/L — ABNORMAL LOW (ref 1.15–1.40)
Chloride: 98 mmol/L (ref 98–111)
GLUCOSE: 392 mg/dL — AB (ref 70–99)
HEMATOCRIT: 37 % (ref 36.0–46.0)
HEMOGLOBIN: 12.6 g/dL (ref 12.0–15.0)
POTASSIUM: 3.6 mmol/L (ref 3.5–5.1)
Sodium: 136 mmol/L (ref 135–145)
TCO2: 28 mmol/L (ref 22–32)

## 2018-08-18 LAB — CBG MONITORING, ED: GLUCOSE-CAPILLARY: 350 mg/dL — AB (ref 70–99)

## 2018-08-18 MED ORDER — DIPHENHYDRAMINE HCL 50 MG/ML IJ SOLN
25.0000 mg | Freq: Once | INTRAMUSCULAR | Status: AC
  Administered 2018-08-18: 25 mg via INTRAVENOUS
  Filled 2018-08-18: qty 1

## 2018-08-18 MED ORDER — METOCLOPRAMIDE HCL 5 MG/ML IJ SOLN
10.0000 mg | Freq: Once | INTRAMUSCULAR | Status: AC
  Administered 2018-08-18: 10 mg via INTRAVENOUS
  Filled 2018-08-18: qty 2

## 2018-08-18 MED ORDER — LORAZEPAM 2 MG/ML IJ SOLN: 1.0000 mg | Freq: Once | INTRAMUSCULAR | Status: DC | PRN

## 2018-08-18 MED ORDER — SODIUM CHLORIDE 0.9 % IV BOLUS
500.0000 mL | Freq: Once | INTRAVENOUS | Status: AC
  Administered 2018-08-18: 500 mL via INTRAVENOUS

## 2018-08-18 NOTE — ED Triage Notes (Addendum)
Pt arrives to ED from work with complaints of a headache since 1800. EMS reports daughter saw pt @1845  and noticed slurred speech. When EMS arrived, no stoke symptoms per EMS assessment. Pt still continuing to have Headache. Pt placed in position of comfort with bed locked and lowered, call bell in reach.

## 2018-08-18 NOTE — ED Provider Notes (Signed)
[facility] EMERGENCY DEPARTMENT Provider Note   CSN: [id number] Arrival date & time: 08/18/18  2043     History   Chief Complaint Chief Complaint  Patient presents with  . Headache    HPI Cindy [name] is a 52 y.o. female.  The history is provided by the patient. No language interpreter was used.   Cindy [name] is a 52 y.o. female who presents to the Emergency Department complaining of HA. Around 3 PM this afternoon she began to develop a mild by temporal headache that then resolved. Around 630 this afternoon her headache began to return and is described as severe in nature. Around 830 this afternoon she endorses slurred speech and sensation like her right side of her face was drooping. She states that currently her headache is improving and she feels like her speech is improved. Her facial droop has completely resolved. She denies any vomiting, fevers, chest pain, shortness of breath. She has a history of migraine headaches but this is not like her typical migraine headaches. She does have a history of hypertension and diabetes. She is not a smoker and does not take hormone medications. No history of TIA or mini stroke. Past Medical History:  Diagnosis Date  . Diabetes mellitus   . GERD (gastroesophageal reflux disease)   . Hyperlipemia   . Hypertension     Patient Active Problem List   Diagnosis Date Noted  . OBESITY 07/01/2010  . Type II or unspecified type diabetes mellitus without mention of complication, not stated as uncontrolled 05/08/2008  . HYPERLIPIDEMIA 05/08/2008  . HYPERTENSION 05/08/2008    Past Surgical History:  Procedure Laterality Date  . None       OB History   None      Home Medications    Prior to Admission medications   Medication Sig Start Date End Date Taking? Authorizing Provider  acetaminophen (TYLENOL) 500 MG tablet Take 1,500 mg by mouth every 6 (six) hours as needed for mild pain.   Yes [provider]  aspirin 325 MG tablet Take 325 mg by mouth daily.   Yes [provider]  lisinopril-hydrochlorothiazide (PRINZIDE,ZESTORETIC) 20-25 MG tablet Take 1 tablet by mouth daily. 08/15/18  Yes [provider]  lovastatin (MEVACOR) 20 MG tablet Take 20 mg by mouth at bedtime.   Yes [provider]  metFORMIN (GLUCOPHAGE) 1000 MG tablet Take 1,000 mg by mouth 2 (two) times daily with a meal.   Yes [provider]  TRESIBA FLEXTOUCH 100 UNIT/ML SOPN FlexTouch Pen Inject 60 Units into the skin daily. 08/15/18  Yes [provider]  hydrochlorothiazide (HYDRODIURIL) 25 MG tablet Take 1 tablet (25 mg total) by mouth daily. Patient not taking: Reported on 08/19/2018 03/15/16   Leonard Schwartz, MD  insulin detemir (LEVEMIR) 100 UNIT/ML injection Inject 0.4 mLs (40 Units total) into the skin at bedtime. Patient not taking: Reported on 08/19/2018 03/15/16   Leonard Schwartz, MD  lisinopril (PRINIVIL,ZESTRIL) 20 MG tablet Take 1 tablet (20 mg total) by mouth daily. Patient not taking: Reported on 08/19/2018 03/15/16   Leonard Schwartz, MD  predniSONE (DELTASONE) 20 MG tablet Take 1 tablet (20 mg total) by mouth 2 (two) times daily with a meal. Patient not taking: Reported on 08/19/2018 06/20/18   Raylene Everts, MD    Family History Family History  Problem Relation Age of Onset  . Diabetes Mother   . Hypertension Mother   . Breast cancer Mother   .  Hypertension Father   . Heart failure Father   . Breast cancer Maternal Grandmother   . Colon cancer Neg Hx   . Stomach cancer Neg Hx   . Rectal cancer Neg Hx   . Esophageal cancer Neg Hx   . Liver cancer Neg Hx     Social History Social History   Tobacco Use  . Smoking status: Never Smoker  . Smokeless tobacco: Never Used  Substance Use Topics  . Alcohol use: No  . Drug use: No     Allergies   Other   Review of Systems Review of Systems  All other systems reviewed and are  negative.    Physical Exam Updated Vital Signs BP (!) 142/77   Pulse 81   Temp (!) 97.3 F (36.3 C) (Oral)   Resp 18   Ht 5\' 4"  (1.626 m)   Wt 108 kg   SpO2 98%   BMI 40.87 kg/m   Physical Exam  Constitutional: She is oriented to person, place, and time. She appears well-developed and well-nourished.  HENT:  Head: Normocephalic and atraumatic.  Right Ear: External ear normal.  Left Ear: External ear normal.  Mouth/Throat: Oropharynx is clear and moist.  Eyes: Pupils are equal, round, and reactive to light. EOM are normal.  Cardiovascular: Normal rate and regular rhythm.  No murmur heard. Pulmonary/Chest: Effort normal and breath sounds normal. No respiratory distress.  Abdominal: Soft. There is no tenderness. There is no rebound and no guarding.  Musculoskeletal: She exhibits no edema or tenderness.  Neurological: She is alert and oriented to person, place, and time. No cranial nerve deficit. Coordination normal.  Visual fields are grossly intact. No pronator drift. Five out of five strength in all four extremities with sensation to light touch intact in all four extremities. Minimally dysarthritic speech.  Skin: Skin is warm and dry.  Psychiatric: She has a normal mood and affect. Her behavior is normal.  Nursing note and vitals reviewed.    ED Treatments / Results  Labs (all labs ordered are listed, but only abnormal results are displayed) Labs Reviewed  CBG MONITORING, ED - Abnormal; Notable for the following components:      Result Value   Glucose-Capillary 350 (*)    All other components within normal limits  I-STAT CHEM 8, ED - Abnormal; Notable for the following components:   BUN 22 (*)    Glucose, Bld 392 (*)    Calcium, Ion 1.06 (*)    All other components within normal limits  ETHANOL  PROTIME-INR  APTT  CBC  DIFFERENTIAL  COMPREHENSIVE METABOLIC PANEL  RAPID URINE DRUG SCREEN, HOSP PERFORMED  URINALYSIS, ROUTINE W REFLEX MICROSCOPIC  HEMOGLOBIN  A1C  LIPID PANEL  I-STAT TROPONIN, ED    EKG EKG Interpretation  Date/Time:  Saturday August 19 2018 00:48:54 EDT Ventricular Rate:  82 PR Interval:    QRS Duration: 93 QT Interval:  412 QTC Calculation: 482 R Axis:   46 Text Interpretation:  Sinus rhythm Low voltage, precordial leads Confirmed by Quintella Reichert [phone]) on 08/19/2018 12:53:56 AM   Radiology Ct Head Wo Contrast  Result Date: 08/18/2018 CLINICAL DATA:  Headache EXAM: CT HEAD WITHOUT CONTRAST TECHNIQUE: Contiguous axial images were obtained from the base of the skull through the vertex without intravenous contrast. COMPARISON:  03/15/2016 FINDINGS: Brain: No evidence of acute infarction, hemorrhage, hydrocephalus, extra-axial collection or mass lesion/mass effect. Vascular: No hyperdense vessel.  Scattered carotid calcification. Skull: Normal. Negative for fracture or focal lesion. Sinuses/Orbits:  Mild mucosal thickening in the ethmoid and maxillary sinuses. No acute orbital abnormality Other: None IMPRESSION: Negative non contrasted CT appearance of the brain. Electronically Signed   By: Donavan Foil M.D.   On: 08/18/2018 22:02   Mr Brain Wo Contrast  Result Date: 08/19/2018 CLINICAL DATA:  52 y/o  F; headache, facial droop, slurred speech. EXAM: MRI HEAD WITHOUT CONTRAST MRV HEAD WITHOUT CONTRAST TECHNIQUE: Multiplanar, multiecho pulse sequences of the brain and surrounding structures were obtained without intravenous contrast. Venographic images of the head were obtained using MRV technique without contrast. COMPARISON:  08/18/2018 CT head. FINDINGS: MRI HEAD FINDINGS Brain: Several punctate foci of reduced diffusion are present within the right putamen, posterior limb of internal capsule, and caudate body compatible with acute/early subacute infarction. There is no associated hemorrhage or mass effect. No extra-axial collection, hydrocephalus, effacement of basilar cisterns, focal mass effect of the brain, or  herniation. Small chronic lacunar infarct within the left thalamus. Scattered nonspecific T2 FLAIR hyperintensities in subcortical and periventricular white matter are compatible with mild chronic microvascular ischemic changes for age. Vascular: Normal flow voids. Skull and upper cervical spine: Normal marrow signal. Sinuses/Orbits: Negative. Other: None. MRV HEAD FINDINGS Superior sagittal sinus, straight sinus, bilateral internal cerebral veins, bilateral basal veins of Rosenthal, bilateral transverse sinus, bilateral sigmoid sinus, in the bilateral upper internal jugular veins demonstrate normal flow related signal. No evidence of dural venous sinus thrombosis. IMPRESSION: 1. Small acute/early subacute infarct involving the right putamen, posterior limb of internal capsule, and caudate body. No associated hemorrhage or mass effect. 2. No evidence of dural venous sinus thrombosis. 3. Mild chronic microvascular ischemic changes of the brain. These results were called by telephone at the time of interpretation on 08/19/2018 at 12:15 am to Dr. Quintella Reichert , who verbally acknowledged these results. Electronically Signed   By: Kristine Garbe M.D.   On: 08/19/2018 00:17   Mr Mrv Head Wo Cm  Result Date: 08/19/2018 CLINICAL DATA:  52 y/o  F; headache, facial droop, slurred speech. EXAM: MRI HEAD WITHOUT CONTRAST MRV HEAD WITHOUT CONTRAST TECHNIQUE: Multiplanar, multiecho pulse sequences of the brain and surrounding structures were obtained without intravenous contrast. Venographic images of the head were obtained using MRV technique without contrast. COMPARISON:  08/18/2018 CT head. FINDINGS: MRI HEAD FINDINGS Brain: Several punctate foci of reduced diffusion are present within the right putamen, posterior limb of internal capsule, and caudate body compatible with acute/early subacute infarction. There is no associated hemorrhage or mass effect. No extra-axial collection, hydrocephalus, effacement of  basilar cisterns, focal mass effect of the brain, or herniation. Small chronic lacunar infarct within the left thalamus. Scattered nonspecific T2 FLAIR hyperintensities in subcortical and periventricular white matter are compatible with mild chronic microvascular ischemic changes for age. Vascular: Normal flow voids. Skull and upper cervical spine: Normal marrow signal. Sinuses/Orbits: Negative. Other: None. MRV HEAD FINDINGS Superior sagittal sinus, straight sinus, bilateral internal cerebral veins, bilateral basal veins of Rosenthal, bilateral transverse sinus, bilateral sigmoid sinus, in the bilateral upper internal jugular veins demonstrate normal flow related signal. No evidence of dural venous sinus thrombosis. IMPRESSION: 1. Small acute/early subacute infarct involving the right putamen, posterior limb of internal capsule, and caudate body. No associated hemorrhage or mass effect. 2. No evidence of dural venous sinus thrombosis. 3. Mild chronic microvascular ischemic changes of the brain. These results were called by telephone at the time of interpretation on 08/19/2018 at 12:15 am to Dr. Quintella Reichert , who verbally acknowledged these results. Electronically  Signed   By: Kristine Garbe M.D.   On: 08/19/2018 00:17    Procedures Procedures (including critical care time)  Medications Ordered in ED Medications  sodium chloride 0.9 % bolus 500 mL (0 mLs Intravenous Stopped 08/19/18 0037)  metoCLOPramide (REGLAN) injection 10 mg (10 mg Intravenous Given 08/18/18 2215)  diphenhydrAMINE (BENADRYL) injection 25 mg (25 mg Intravenous Given 08/18/18 2215)     Initial Impression / Assessment and Plan / ED Course  I have reviewed the triage vital signs and the nursing notes.  Pertinent labs & imaging results that were available during my care of the patient were reviewed by me and considered in my medical decision making (see chart for details).     Patient here for evaluation of headache,  reports of dysarthria and facial droop. She has minimal dysarthria on examination, no focal neurologic deficits. Will treat her headache with headache cocktail. CT head is negative for acute abnormality. MRI is consistent with acute CVA. Discussed with Dr. Leonel Ramsay with neurology, who will see the patient and consult. Discussed with the medicine service who will see the patient. Patient updated of findings of studies recommendation for admission and she is in agreement with treatment plan.  Final Clinical Impressions(s) / ED Diagnoses   Final diagnoses:  None    ED Discharge Orders    None       Quintella Reichert, MD 08/19/18 [phone]

## 2018-08-18 NOTE — ED Notes (Signed)
Patient transported to CT 

## 2018-08-19 ENCOUNTER — Observation Stay (HOSPITAL_COMMUNITY): Payer: BLUE CROSS/BLUE SHIELD

## 2018-08-19 ENCOUNTER — Other Ambulatory Visit: Payer: Self-pay

## 2018-08-19 ENCOUNTER — Encounter (HOSPITAL_COMMUNITY): Payer: Self-pay | Admitting: Radiology

## 2018-08-19 DIAGNOSIS — I6381 Other cerebral infarction due to occlusion or stenosis of small artery: Secondary | ICD-10-CM | POA: Diagnosis not present

## 2018-08-19 DIAGNOSIS — I639 Cerebral infarction, unspecified: Secondary | ICD-10-CM | POA: Diagnosis not present

## 2018-08-19 DIAGNOSIS — R29810 Facial weakness: Secondary | ICD-10-CM | POA: Diagnosis not present

## 2018-08-19 DIAGNOSIS — R0683 Snoring: Secondary | ICD-10-CM

## 2018-08-19 DIAGNOSIS — R471 Dysarthria and anarthria: Secondary | ICD-10-CM | POA: Diagnosis not present

## 2018-08-19 DIAGNOSIS — R4781 Slurred speech: Secondary | ICD-10-CM | POA: Diagnosis not present

## 2018-08-19 DIAGNOSIS — K219 Gastro-esophageal reflux disease without esophagitis: Secondary | ICD-10-CM

## 2018-08-19 DIAGNOSIS — I10 Essential (primary) hypertension: Secondary | ICD-10-CM

## 2018-08-19 DIAGNOSIS — E1142 Type 2 diabetes mellitus with diabetic polyneuropathy: Secondary | ICD-10-CM

## 2018-08-19 DIAGNOSIS — D649 Anemia, unspecified: Secondary | ICD-10-CM

## 2018-08-19 DIAGNOSIS — R5383 Other fatigue: Secondary | ICD-10-CM

## 2018-08-19 DIAGNOSIS — Z7982 Long term (current) use of aspirin: Secondary | ICD-10-CM

## 2018-08-19 DIAGNOSIS — Z8673 Personal history of transient ischemic attack (TIA), and cerebral infarction without residual deficits: Secondary | ICD-10-CM

## 2018-08-19 DIAGNOSIS — R011 Cardiac murmur, unspecified: Secondary | ICD-10-CM

## 2018-08-19 DIAGNOSIS — Z79899 Other long term (current) drug therapy: Secondary | ICD-10-CM

## 2018-08-19 DIAGNOSIS — E1165 Type 2 diabetes mellitus with hyperglycemia: Secondary | ICD-10-CM

## 2018-08-19 DIAGNOSIS — I63311 Cerebral infarction due to thrombosis of right middle cerebral artery: Secondary | ICD-10-CM

## 2018-08-19 DIAGNOSIS — E785 Hyperlipidemia, unspecified: Secondary | ICD-10-CM

## 2018-08-19 DIAGNOSIS — Z794 Long term (current) use of insulin: Secondary | ICD-10-CM

## 2018-08-19 DIAGNOSIS — E118 Type 2 diabetes mellitus with unspecified complications: Secondary | ICD-10-CM

## 2018-08-19 LAB — PROTIME-INR
INR: 1.06
PROTHROMBIN TIME: 13.7 s (ref 11.4–15.2)

## 2018-08-19 LAB — DIFFERENTIAL
Abs Immature Granulocytes: 0 10*3/uL (ref 0.0–0.1)
BASOS ABS: 0 10*3/uL (ref 0.0–0.1)
BASOS PCT: 0 %
EOS ABS: 0.1 10*3/uL (ref 0.0–0.7)
Eosinophils Relative: 1 %
Immature Granulocytes: 0 %
Lymphocytes Relative: 38 %
Lymphs Abs: 3.7 10*3/uL (ref 0.7–4.0)
Monocytes Absolute: 0.6 10*3/uL (ref 0.1–1.0)
Monocytes Relative: 6 %
NEUTROS PCT: 55 %
Neutro Abs: 5.5 10*3/uL (ref 1.7–7.7)

## 2018-08-19 LAB — COMPREHENSIVE METABOLIC PANEL
ALT: 12 U/L (ref 0–44)
ANION GAP: 9 (ref 5–15)
AST: 17 U/L (ref 15–41)
Albumin: 2.8 g/dL — ABNORMAL LOW (ref 3.5–5.0)
Alkaline Phosphatase: 68 U/L (ref 38–126)
BILIRUBIN TOTAL: 0.6 mg/dL (ref 0.3–1.2)
BUN: 19 mg/dL (ref 6–20)
CHLORIDE: 103 mmol/L (ref 98–111)
CO2: 25 mmol/L (ref 22–32)
Calcium: 8.5 mg/dL — ABNORMAL LOW (ref 8.9–10.3)
Creatinine, Ser: 0.84 mg/dL (ref 0.44–1.00)
GFR calc Af Amer: >60 mL/min (ref >60)
GFR calc non Af Amer: >60 mL/min (ref >60)
Glucose, Bld: 308 mg/dL — ABNORMAL HIGH (ref 70–99)
POTASSIUM: 3.7 mmol/L (ref 3.5–5.1)
SODIUM: 137 mmol/L (ref 135–145)
TOTAL PROTEIN: 6.5 g/dL (ref 6.5–8.1)

## 2018-08-19 LAB — LIPID PANEL
Cholesterol: 186 mg/dL (ref 0–200)
HDL: 32 mg/dL — ABNORMAL LOW (ref >40)
LDL Cholesterol: 126 mg/dL — ABNORMAL HIGH (ref 0–99)
Total CHOL/HDL Ratio: 5.8 RATIO
Triglycerides: 140 mg/dL (ref <150)
VLDL: 28 mg/dL (ref 0–40)

## 2018-08-19 LAB — GLUCOSE, CAPILLARY
GLUCOSE-CAPILLARY: 198 mg/dL — AB (ref 70–99)
Glucose-Capillary: 247 mg/dL — ABNORMAL HIGH (ref 70–99)
Glucose-Capillary: 210 mg/dL — ABNORMAL HIGH (ref 70–99)
Glucose-Capillary: 189 mg/dL — ABNORMAL HIGH (ref 70–99)

## 2018-08-19 LAB — URINALYSIS, ROUTINE W REFLEX MICROSCOPIC
Bilirubin Urine: NEGATIVE
HGB URINE DIPSTICK: NEGATIVE
Ketones, ur: NEGATIVE mg/dL
Leukocytes, UA: NEGATIVE
NITRITE: POSITIVE — AB
Protein, ur: NEGATIVE mg/dL
Specific Gravity, Urine: 1.018 (ref 1.005–1.030)
pH: 5 (ref 5.0–8.0)

## 2018-08-19 LAB — CBC
HCT: 36.2 % (ref 36.0–46.0)
Hemoglobin: 11.3 g/dL — ABNORMAL LOW (ref 12.0–15.0)
MCH: 30.9 pg (ref 26.0–34.0)
MCHC: 31.2 g/dL (ref 30.0–36.0)
MCV: 98.9 fL (ref 78.0–100.0)
PLATELETS: 315 10*3/uL (ref 150–400)
RBC: 3.66 MIL/uL — AB (ref 3.87–5.11)
RDW: 13 % (ref 11.5–15.5)
WBC: 10 10*3/uL (ref 4.0–10.5)

## 2018-08-19 LAB — IRON AND TIBC
IRON: 43 ug/dL (ref 28–170)
Saturation Ratios: 13 % (ref 10.4–31.8)
TIBC: 319 ug/dL (ref 250–450)
UIBC: 276 ug/dL

## 2018-08-19 LAB — HIV ANTIBODY (ROUTINE TESTING W REFLEX): HIV SCREEN 4TH GENERATION: NONREACTIVE

## 2018-08-19 LAB — ECHOCARDIOGRAM COMPLETE
HEIGHTINCHES: 64 in
WEIGHTICAEL: 3809.55 [oz_av]

## 2018-08-19 LAB — APTT: APTT: 31 s (ref 24–36)

## 2018-08-19 LAB — I-STAT TROPONIN, ED: Troponin i, poc: 0 ng/mL (ref 0.00–0.08)

## 2018-08-19 LAB — HEMOGLOBIN A1C
HEMOGLOBIN A1C: 11.7 % — AB (ref 4.8–5.6)
MEAN PLASMA GLUCOSE: 289.09 mg/dL

## 2018-08-19 LAB — RAPID URINE DRUG SCREEN, HOSP PERFORMED
AMPHETAMINES: NOT DETECTED
BENZODIAZEPINES: NOT DETECTED
Barbiturates: NOT DETECTED
Cocaine: NOT DETECTED
OPIATES: NOT DETECTED
Tetrahydrocannabinol: NOT DETECTED

## 2018-08-19 LAB — ETHANOL

## 2018-08-19 LAB — FERRITIN: Ferritin: 62 ng/mL (ref 11–307)

## 2018-08-19 MED ORDER — INSULIN ASPART 100 UNIT/ML ~~LOC~~ SOLN
0.0000 [IU] | Freq: Three times a day (TID) | SUBCUTANEOUS | Status: DC
  Administered 2018-08-19: 5 [IU] via SUBCUTANEOUS
  Administered 2018-08-19: 3 [IU] via SUBCUTANEOUS
  Administered 2018-08-19: 2 [IU] via SUBCUTANEOUS
  Administered 2018-08-20: 3 [IU] via SUBCUTANEOUS

## 2018-08-19 MED ORDER — INSULIN GLARGINE 100 UNIT/ML ~~LOC~~ SOLN
35.0000 [IU] | Freq: Every day | SUBCUTANEOUS | Status: DC
  Administered 2018-08-19: 35 [IU] via SUBCUTANEOUS
  Filled 2018-08-19 (×2): qty 0.35

## 2018-08-19 MED ORDER — ASPIRIN 325 MG PO TABS
325.0000 mg | ORAL_TABLET | Freq: Every day | ORAL | Status: DC
  Administered 2018-08-19 – 2018-08-20 (×2): 325 mg via ORAL
  Filled 2018-08-19 (×2): qty 1

## 2018-08-19 MED ORDER — CLOPIDOGREL BISULFATE 75 MG PO TABS
75.0000 mg | ORAL_TABLET | Freq: Every day | ORAL | Status: DC
  Administered 2018-08-20: 75 mg via ORAL
  Filled 2018-08-19: qty 1

## 2018-08-19 MED ORDER — ONDANSETRON HCL 4 MG/2ML IJ SOLN: 4.0000 mg | Freq: Four times a day (QID) | INTRAMUSCULAR | Status: DC | PRN

## 2018-08-19 MED ORDER — CLOPIDOGREL BISULFATE 75 MG PO TABS
300.0000 mg | ORAL_TABLET | Freq: Once | ORAL | Status: AC
  Administered 2018-08-19: 300 mg via ORAL
  Filled 2018-08-19: qty 4

## 2018-08-19 MED ORDER — ACETAMINOPHEN 650 MG RE SUPP: 650.0000 mg | Freq: Four times a day (QID) | RECTAL | Status: DC | PRN

## 2018-08-19 MED ORDER — ONDANSETRON HCL 4 MG PO TABS: 4.0000 mg | ORAL_TABLET | Freq: Four times a day (QID) | ORAL | Status: DC | PRN

## 2018-08-19 MED ORDER — ACETAMINOPHEN 325 MG PO TABS: 650.0000 mg | ORAL_TABLET | Freq: Four times a day (QID) | ORAL | Status: DC | PRN

## 2018-08-19 MED ORDER — SODIUM CHLORIDE 0.9% FLUSH
3.0000 mL | Freq: Two times a day (BID) | INTRAVENOUS | Status: DC
  Administered 2018-08-19 – 2018-08-20 (×4): 3 mL via INTRAVENOUS

## 2018-08-19 MED ORDER — HYDRALAZINE HCL 20 MG/ML IJ SOLN
5.0000 mg | Freq: Once | INTRAMUSCULAR | Status: AC
  Administered 2018-08-19: 5 mg via INTRAVENOUS
  Filled 2018-08-19: qty 1

## 2018-08-19 MED ORDER — INSULIN GLARGINE 100 UNIT/ML ~~LOC~~ SOLN: 30.0000 [IU] | Freq: Every day | SUBCUTANEOUS | Status: DC

## 2018-08-19 MED ORDER — ATORVASTATIN CALCIUM 80 MG PO TABS
80.0000 mg | ORAL_TABLET | Freq: Every day | ORAL | Status: DC
  Administered 2018-08-19: 80 mg via ORAL
  Filled 2018-08-19: qty 1

## 2018-08-19 MED ORDER — IOPAMIDOL (ISOVUE-370) INJECTION 76%
INTRAVENOUS | Status: AC
  Administered 2018-08-19: 50 mL
  Filled 2018-08-19: qty 50

## 2018-08-19 MED ORDER — ENOXAPARIN SODIUM 40 MG/0.4ML ~~LOC~~ SOLN
40.0000 mg | SUBCUTANEOUS | Status: DC
  Administered 2018-08-19: 40 mg via SUBCUTANEOUS
  Filled 2018-08-19: qty 0.4

## 2018-08-19 NOTE — Evaluation (Signed)
Physical Therapy Evaluation Patient Details Name: Cindy [name] MRN: [id number] DOB: 1966/02/21 Today's Date: 08/19/2018   History of Present Illness  Cindy [name] is a 52 y.o. female with a history of diabetes, hyperlipidemia, hypertension who had a headache over the course the day intermittently and then developed left facial weakness, LUE weakness, and slurred speech sometime between 7 and 8 PM.  MRI showed R small infarct of right putamen, internal capsule and caudate.  Clinical Impression  Pt admitted with above diagnosis. Pt currently with functional limitations due to the deficits listed below (see PT Problem List). Pt unsteady in standing with slow, guarded gait. Ambulated 200' with min HHA.  Pt will benefit from skilled PT to increase their independence and safety with mobility to allow discharge to the venue listed below.       Follow Up Recommendations Outpatient PT;Supervision for mobility/OOB    Equipment Recommendations  Other (comment)(TBD)    Recommendations for Other Services       Precautions / Restrictions Precautions Precautions: None Restrictions Weight Bearing Restrictions: No      Mobility  Bed Mobility Overal bed mobility: Modified Independent             General bed mobility comments: pt able to get to EOB without physical assist or inordinate amt of time  Transfers Overall transfer level: Needs assistance Equipment used: None Transfers: Sit to/from Stand Sit to Stand: Min guard         General transfer comment: pt felt unsteady with standing initially  Ambulation/Gait Ambulation/Gait assistance: Min assist Gait Distance (Feet): 200 Feet Assistive device: None;1 person hand held assist Gait Pattern/deviations: Step-through pattern;Decreased stride length Gait velocity: decreased Gait velocity interpretation: <1.31 ft/sec, indicative of household ambulator General Gait Details: cautious, guarded gait with no arm swing. Pt was  unable to increase pace and widened BOS for increased stability  Stairs            Wheelchair Mobility    Modified Rankin (Stroke Patients Only) Modified Rankin (Stroke Patients Only) Pre-Morbid Rankin Score: No symptoms Modified Rankin: Moderately severe disability     Balance Overall balance assessment: Needs assistance Sitting-balance support: No upper extremity supported;Feet supported Sitting balance-Leahy Scale: Fair     Standing balance support: No upper extremity supported;During functional activity Standing balance-Leahy Scale: Fair Standing balance comment: min guard for safety, preference for at least 1 hand support                             Pertinent Vitals/Pain Pain Assessment: No/denies pain    Home Living Family/patient expects to be discharged to:: Private residence Living Arrangements: Children Available Help at Discharge: Family;Available 24 hours/day Type of Home: House Home Access: Stairs to enter   [employer] of Steps: 2 Home Layout: One level Home Equipment: None      Prior Function Level of Independence: Independent         Comments: independent, 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      Hand Dominance   Dominant Hand: Right    Extremity/Trunk Assessment   Upper Extremity Assessment Upper Extremity Assessment: Defer to OT evaluation LUE Deficits / Details: grossly 4/5 MMT, slight dysmetria/slow but able to use functionally LUE Sensation: WNL LUE Coordination: decreased fine motor    Lower Extremity Assessment Lower Extremity Assessment: LLE deficits/detail LLE Deficits / Details: hip flex 3+/5, knee ext 3+/5, knee flex 4/5 LLE Sensation: WNL LLE Coordination: decreased  gross motor    Cervical / Trunk Assessment Cervical / Trunk Assessment: Normal  Communication   Communication: Expressive difficulties(increased time and slurring)  Cognition Arousal/Alertness: Awake/alert Behavior During  Therapy: WFL for tasks assessed/performed Overall Cognitive Status: Impaired/Different from baseline Area of Impairment: Following commands;Problem solving                       Following Commands: Follows multi-step commands with increased time     Problem Solving: Slow processing;Difficulty sequencing;Requires verbal cues General Comments: Pt requires increased time, difficulty noted with sequencing and dual cognitive tasks.      General Comments General comments (skin integrity, edema, etc.): daughter present and supportive    Exercises     Assessment/Plan    PT Assessment Patient needs continued PT services  PT Problem List Decreased strength;Decreased activity tolerance;Decreased balance;Decreased mobility;Decreased coordination;Decreased cognition;Decreased knowledge of use of DME;Decreased knowledge of precautions;Impaired tone       PT Treatment Interventions DME instruction;Gait training;Stair training;Functional mobility training;Therapeutic activities;Therapeutic exercise;Balance training;Patient/family education;Neuromuscular re-education;Cognitive remediation    PT Goals (Current goals can be found in the Care Plan section)  Acute Rehab PT Goals Patient Stated Goal: to feel better and go home  PT Goal Formulation: With patient Time For Goal Achievement: 09/02/18 Potential to Achieve Goals: Good    Frequency Min 4X/week   Barriers to discharge        Co-evaluation               AM-PAC PT "6 Clicks" Daily Activity  Outcome Measure Difficulty turning over in bed (including adjusting bedclothes, sheets and blankets)?: None Difficulty moving from lying on back to sitting on the side of the bed? : None Difficulty sitting down on and standing up from a chair with arms (e.g., wheelchair, bedside commode, etc,.)?: A Little Help needed moving to and from a bed to chair (including a wheelchair)?: A Little Help needed walking in hospital room?: A  Little Help needed climbing 3-5 steps with a railing? : A Little 6 Click Score: 20    End of Session Equipment Utilized During Treatment: Gait belt Activity Tolerance: Patient tolerated treatment well Patient left: in chair;with call bell/phone within reach;with family/visitor present Nurse Communication: Mobility status PT Visit Diagnosis: Unsteadiness on feet (R26.81);Hemiplegia and hemiparesis Hemiplegia - Right/Left: Left Hemiplegia - dominant/non-dominant: Non-dominant Hemiplegia - caused by: Cerebral infarction    Time: 1694-5038 PT Time Calculation (min) (ACUTE ONLY): 21 min   Charges:   PT Evaluation $PT Eval Moderate Complexity: [place]  Pager [phone] Office [place] 08/19/2018, 1:41 PM

## 2018-08-19 NOTE — ED Notes (Signed)
ED Provider at bedside. 

## 2018-08-19 NOTE — Progress Notes (Addendum)
   Subjective: Cindy [name] was seen and evaluated at bedside on morning rounds. Daughter accompanied her at bedside. She denied any complaints. Daughter noted the left side of her face was still a little droopy and speech still seems slower. During our encounter, Cindy [name] became quite tearful. She felt overwhelmed by everything going on. We spent time comforting her and that we are working to make sure she does not have any other significant risk factors that need to be addressed while she is here in the hospital.   Objective:  Vital signs in last 24 hours: Vitals:   08/19/18 0436 08/19/18 0600 08/19/18 0633 08/19/18 0739  BP:   101/76   Pulse: 85 66 81   Resp:  15 18   Temp:    97.7 F (36.5 C)  TempSrc:    Oral  SpO2:   97%   Weight:      Height:       General: awake, alert, pleasant woman lying in bed in NAD CV: RRR; soft SEM heard at upper sternal borders  Pulm: no increased work of breathing; lungs CTA bilaterally Neuro: A&Ox4; left sided facial droop, other cranial nerves intact; strength is 5/5 in bilateral upper and lower extremities; sensation intact throughout; speech is slow but coherent without slurring   Assessment/Plan:  Active Problems:   Acute CVA (cerebrovascular accident) (National)  1. Acute CVA: MRI showed infarct of right putamen, internal capsule and caudate. Continues to have slow speech and left-sided facial droop. She has several risk factors for vascular disease including HLD, uncontrolled DM and HTN. Neuro is on board, and she is undergoing work-up.  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 Since she is not symptomatic from location of severe stenosis, would recommend medical management of lifestyle modifications.  - echo pending -  patient was on aspirin at home so will now be on DAPT with ASA 325 mg and Plavix 75 mg daily  - PT/OT and SLP to evaluate and treat - she passed swallow screen so have ordered HH/CM diet   Uncontrolled TIIDM: Glucose improved from high 300s to 198. A1c 11.2. On Tresiba & metformin at home. Part of the issue seems to be affordability with [place]. Will recommend working with PCP to figure out more affordable options.  - SSI moderate  - Lantus 35 U qhs  HTN: holding home anti-hypertensives to allow for permissive HTN.   Acute Normocytic Anemia: Hgb 11.2 on admission. No history of anemia. No evidence of acute blood loss. Will order iron studies to further evaluate.   Likely OSA: patient's symptoms of daytime fatigue, snoring and witnessed apneic episodes by daughter are consistent with sleep apnea. Will recommended outpatient sleep study arranged by PCP.   Dispo: Anticipated discharge in approximately 1-2 day(s).   Modena Nunnery D, DO 08/19/2018, 10:28 AM Pager: [phone]

## 2018-08-19 NOTE — ED Notes (Signed)
Admitting MDs at bedside.

## 2018-08-19 NOTE — Progress Notes (Signed)
SLP Cancellation Note  Patient Details Name: Cindy [name] MRN: [id number] DOB: 1966-06-09   Cancelled treatment:       Reason Eval/Treat Not Completed: SLP screened, no needs identified, will sign off. Patient passed RN stroke swallow screen and is currently on regular consistency solids and thin liquids diet, which she is tolerating without difficulty or concern for aspiration per RN.   Sonia Baller, MA, CCC-SLP 08/19/18 1:52 PM

## 2018-08-19 NOTE — Consult Note (Signed)
Neurology Consultation Reason for Consult: Stroke Referring Physician: Ralene Bathe, E  CC: Stroke  History is obtained from: Patient  HPI: Cindy [name] is a 52 y.o. female with a history of diabetes, hyperlipidemia, hypertension who had a headache over the course the day intermittently and then developed left facial weakness and slurred speech sometime between 7 and 8 PM.  She presented to the emergency department.  Due to her mild symptoms, she is not a TPA candidate, but an MRI was obtained which does demonstrate a small subcortical ischemic infarct on the right.  She states that she already feels better, she still does note some weakness of the left arm.   LKW: 7 PM tpa given?: no, mild symptoms    ROS: A 14 point ROS was performed and is negative except as noted in the HPI.   Past Medical History:  Diagnosis Date  . Diabetes mellitus   . GERD (gastroesophageal reflux disease)   . Hyperlipemia   . Hypertension      Family History  Problem Relation Age of Onset  . Diabetes Mother   . Hypertension Mother   . Breast cancer Mother   . Hypertension Father   . Heart failure Father   . Breast cancer Maternal Grandmother   . Colon cancer Neg Hx   . Stomach cancer Neg Hx   . Rectal cancer Neg Hx   . Esophageal cancer Neg Hx   . Liver cancer Neg Hx      Social History:  reports that she has never smoked. She has never used smokeless tobacco. She reports that she does not drink alcohol or use drugs.   Exam: Current vital signs: BP 134/67 (BP Location: Left Arm)   Pulse 81   Temp 98.2 F (36.8 C) (Oral)   Resp 20   Ht 5\' 4"  (1.626 m)   Wt 108 kg   SpO2 98%   BMI 40.87 kg/m  Vital signs in last 24 hours: Temp:  [97.3 F (36.3 C)-98.2 F (36.8 C)] 98.2 F (36.8 C) (09/21 0225) Pulse Rate:  [81-90] 81 (09/21 0225) Resp:  [16-22] 20 (09/21 0225) BP: (115-145)/(64-92) 134/67 (09/21 0225) SpO2:  [94 %-98 %] 98 % (09/21 0225) Weight:  [003 kg] 108 kg (09/20  2047)   Physical Exam  Constitutional: Appears well-developed and well-nourished.  Psych: Affect appropriate to situation Eyes: No scleral injection HENT: No OP obstrucion Head: Normocephalic.  Cardiovascular: Normal rate and regular rhythm.  Respiratory: Effort normal, non-labored breathing GI: Soft.  No distension. There is no tenderness.  Skin: WDI  Neuro: Mental Status: Patient is awake, alert, oriented to person, place, month, year, and situation. Patient is able to give a clear and coherent history. No signs of aphasia or neglect Cranial Nerves: II: Visual Fields are full. Pupils are equal, round, and reactive to light.   III,IV, VI: EOMI without ptosis or diploplia.  V: Facial sensation is symmetric to temperature VII: Facial movement with mild flattening of the left nasolabial fold VIII: hearing is intact to voice X: Uvula elevates symmetrically XI: Shoulder shrug is symmetric. XII: tongue is midline without atrophy or fasciculations.  Motor: Tone is normal. Bulk is normal. 5/5 strength was present in bilateral legs in the right arm, left arm with 4+/5 strength and mild drift Sensory: Sensation is symmetric to light touch and temperature in the arms and legs. Cerebellar: FNF  intact bilaterally   I have reviewed labs in epic and the results pertinent to this consultation are:  Elevated glucose  I have reviewed the images obtained: MRI brain-small subcortical infarct on the right  Impression: 52 year old female with a history of multiple stroke risk factors including hypertension, hyperlipidemia, diabetes who presents with what is likely small vessel ischemic infarct.  She will need to be admitted for PT evaluation as well as secondary risk factor modification.  Recommendations: - HgbA1c, fasting lipid panel - Frequent neuro checks - Echocardiogram - Carotid dopplers - Prophylactic therapy-Antiplatelet med: Aspirin - dose 325mg  PO or 300mg  PR plus Plavix 75 mg  daily following 300 mg load - Risk factor modification - Telemetry monitoring - PT consult, OT consult, Speech consult - Stroke team to follow    Roland Rack, MD [facility] [phone]  If 7pm- 7am, please page neurology on call as listed in [place].

## 2018-08-19 NOTE — Evaluation (Signed)
Occupational Therapy Evaluation Patient Details Name: Cindy [name] MRN: [id number] DOB: 1966/02/06 Today's Date: 08/19/2018    History of Present Illness Cindy [name] is a 52 y.o. female with a history of diabetes, hyperlipidemia, hypertension who had a headache over the course the day intermittently and then developed left facial weakness, LUE weakness, and slurred speech sometime between 7 and 8 PM.  MRI showed R small infarct of right putamen, internal capsule and caudate.   Clinical Impression   PTA patient independent, working and driving.  She was admitted for above and limited by L UE weakness, slightly impaired coordination of L UE, decreased activity tolerance, impaired balance and impaired sequencing and problem solving. She currently requires setup assist for UB ADLs, min assist for LB ADLs, min guard for grooming standing, min guard for toilet transfers, and min guard for toileting.  She demonstrates ability to complete functional mobility without AD with min guard assist, visual scanning to locate items in all planes without cueing, but difficulty with dual cognitive tasks.  Patient reports blurry vision with intermittent reports of diplopia, side by side in far gaze.  She will benefit from continued OT services while admitted in order to optimize independence with ADLs, mobility and transfers but anticipate no further needs at dc.      Follow Up Recommendations  No OT follow up;Supervision - Intermittent    Equipment Recommendations  3 in 1 bedside commode    Recommendations for Other Services PT consult     Precautions / Restrictions Precautions Precautions: None Restrictions Weight Bearing Restrictions: No      Mobility Bed Mobility               General bed mobility comments: seated OOB in recliner   Transfers Overall transfer level: Needs assistance Equipment used: None Transfers: Sit to/from Stand Sit to Stand: Min guard         General  transfer comment: min guard for safety and balance    Balance Overall balance assessment: Needs assistance Sitting-balance support: No upper extremity supported;Feet supported Sitting balance-Leahy Scale: Fair     Standing balance support: No upper extremity supported;During functional activity Standing balance-Leahy Scale: Fair Standing balance comment: min guard for safety, preference for at least 1 hand support                           ADL either performed or assessed with clinical judgement   ADL Overall ADL's : Needs assistance/impaired     Grooming: Min guard;Standing   Upper Body Bathing: Set up;Sitting   Lower Body Bathing: Minimal assistance;Sit to/from stand   Upper Body Dressing : Set up;Sitting   Lower Body Dressing: Minimal assistance;Sit to/from stand Lower Body Dressing Details (indicate cue type and reason): decreased reach and impaired coordination, min guard for safety in standing  Toilet Transfer: Min guard;Ambulation(simulated to recliner )   Toileting- Clothing Manipulation and Hygiene: Min guard;Sit to/from stand       Functional mobility during ADLs: Min guard General ADL Comments: Completed ADLs, mobility in room and hallway.  Increased time for processing and sequencing.      Vision Baseline Vision/History: No visual deficits Patient Visual Report: Diplopia;Blurring of vision(reports blurry vision with intermittent diplopia ) Vision Assessment?: Yes Eye Alignment: Within Functional Limits Ocular Range of Motion: Within Functional Limits Alignment/Gaze Preference: Within Defined Limits Tracking/Visual Pursuits: Able to track stimulus in all quads without difficulty Saccades: Within functional limits Convergence: Within functional  limits Visual Fields: No apparent deficits Diplopia Assessment: Present in far gaze;Objects split side to side(intermittent )     Perception     Praxis      Pertinent Vitals/Pain Pain Assessment:  No/denies pain     Hand Dominance Right   Extremity/Trunk Assessment Upper Extremity Assessment Upper Extremity Assessment: LUE deficits/detail LUE Deficits / Details: grossly 4/5 MMT, slight dysmetria/slow but able to use functionally LUE Sensation: WNL LUE Coordination: decreased fine motor   Lower Extremity Assessment Lower Extremity Assessment: Defer to PT evaluation       Communication Communication Communication: Expressive difficulties(increased time required)   Cognition Arousal/Alertness: Awake/alert Behavior During Therapy: WFL for tasks assessed/performed Overall Cognitive Status: Impaired/Different from baseline Area of Impairment: Following commands;Problem solving                       Following Commands: Follows multi-step commands with increased time     Problem Solving: Slow processing;Difficulty sequencing;Requires verbal cues General Comments: Pt requires increased time, difficulty noted with sequencing and dual cognitive tasks.   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         OT Problem List: Decreased strength;Decreased activity tolerance;Impaired balance (sitting and/or standing);Decreased coordination;Impaired vision/perception;Decreased cognition      OT Treatment/Interventions: Self-care/ADL training;Therapeutic exercise;Neuromuscular education;Energy  conservation;DME and/or AE instruction;Therapeutic activities;Cognitive remediation/compensation;Visual/perceptual remediation/compensation;Patient/family education;Balance training    OT Goals(Current goals can be found in the care plan section) Acute Rehab OT Goals Patient Stated Goal: to feel better and go home  OT Goal Formulation: With patient Time For Goal Achievement: 09/02/18 Potential to Achieve Goals: Good  OT Frequency: Min 2X/week   Barriers to D/C:            Co-evaluation              AM-PAC PT "6 Clicks" Daily Activity     Outcome Measure Help from another person eating meals?: None Help from another person taking care of personal grooming?: A Little Help from another person toileting, which includes using toliet, bedpan, or urinal?: A Little Help from another person bathing (including washing, rinsing, drying)?: A Little Help from another person to put on and taking off regular upper body clothing?: None Help from another person to put on and taking off regular lower body clothing?: A Little 6 Click Score: 20   End of Session Equipment Utilized During Treatment: Gait belt Nurse Communication: Mobility status  Activity Tolerance: Patient tolerated treatment well Patient left: in chair;with family/visitor present  OT Visit Diagnosis: Unsteadiness on feet (R26.81);Other abnormalities of gait and mobility (R26.89);Muscle weakness (generalized) (M62.81);Other symptoms and signs involving the nervous system (R29.898);Other symptoms and signs involving cognitive function;Hemiplegia and hemiparesis Hemiplegia - Right/Left: Left Hemiplegia - dominant/non-dominant: Non-Dominant Hemiplegia - caused by: Cerebral infarction                Time:  0223-3612 OT Time Calculation (min): 24 min Charges:  OT General Charges $OT Visit: 1 Visit OT Evaluation $OT Eval Moderate Complexity: 1 Mod  Delight Stare, OT Acute Rehabilitation Services Pager [phone] Office  [phone]   Delight Stare 08/19/2018, 1:22 PM

## 2018-08-19 NOTE — Progress Notes (Signed)
Patient arrived to the floor AOX 4, no stroke deficit noted, vital signs WNL, skin assessment and tele monitor initiated. Will continue to monitor.

## 2018-08-19 NOTE — Progress Notes (Signed)
STROKE TEAM PROGRESS NOTE   SUBJECTIVE (INTERVAL HISTORY) Her daughter and son are at the bedside.  Pt sitting in chair, felt better than yesterday. Still has mild left facial droop and hand weakness. Admitted that her DM and HTN not in good control at home.    OBJECTIVE Vitals:   08/19/18 0600 08/19/18 0633 08/19/18 0739 08/19/18 1213  BP:  101/76    Pulse: 66 81    Resp: 15 18    Temp:   97.7 F (36.5 C) 97.7 F (36.5 C)  TempSrc:   Oral Oral  SpO2:  97%    Weight:      Height:        CBC:  Recent Labs  Lab 08/18/18 2227 08/19/18 0044  WBC  --  10.0  NEUTROABS  --  5.5  HGB 12.6 11.3*  HCT 37.0 36.2  MCV  --  98.9  PLT  --  245    Basic Metabolic Panel:  Recent Labs  Lab 08/18/18 2227 08/19/18 0044  NA 136 137  K 3.6 3.7  CL 98 103  CO2  --  25  GLUCOSE 392* 308*  BUN 22* 19  CREATININE 0.90 0.84  CALCIUM  --  8.5*    Lipid Panel:     Component Value Date/Time   CHOL 186 08/19/2018 0044   TRIG 140 08/19/2018 0044   HDL 32 (L) 08/19/2018 0044   CHOLHDL 5.8 08/19/2018 0044   VLDL 28 08/19/2018 0044   LDLCALC 126 (H) 08/19/2018 0044   HgbA1c:  Lab Results  Component Value Date   HGBA1C 11.7 (H) 08/19/2018   Urine Drug Screen:     Component Value Date/Time   LABOPIA NONE DETECTED 08/19/2018 0810   COCAINSCRNUR NONE DETECTED 08/19/2018 0810   LABBENZ NONE DETECTED 08/19/2018 0810   AMPHETMU NONE DETECTED 08/19/2018 0810   THCU NONE DETECTED 08/19/2018 0810   LABBARB NONE DETECTED 08/19/2018 0810    Alcohol Level     Component Value Date/Time   ETH <10 08/19/2018 0044    IMAGING  Ct Head Wo Contrast 08/18/2018 IMPRESSION:  Negative non contrasted CT appearance of the brain.   Mr Brain 9 Contrast Mr Mrv Head Wo Cm 08/19/2018 IMPRESSION:  1. Small acute/early subacute infarct involving the right putamen, posterior limb of internal capsule, and caudate body. No associated hemorrhage or mass effect.  2. No evidence of dural venous  sinus thrombosis.  3. Mild chronic microvascular ischemic changes of the brain.   CTA Head and Neck 08/19/2018 IMPRESSION: 1. Negative for large vessel occlusion, but positive for multiple moderate to severe intracranial 1st and 2nd order arterial stenoses: - bilateral PCA P1/P2 junctions (moderate to severe). - distal Left MCA M1 (severe). - Right MCA posterior M2 origin (moderate). 2. 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 3. No significant arterial stenosis in the neck. Highly tortuous cervical ICAs. 4. Subtle evidence of the acute small vessel ischemia in the posterior right deep gray matter on CT. No new No acute intracranial abnormality.    Transthoracic Echocardiogram 08/19/2018 Study Conclusions - Left ventricle: The cavity size was normal. Wall thickness was   increased in a pattern of mild LVH. Systolic function was normal.   The estimated ejection fraction was in the range of 55% to 60%.   Wall motion was normal; there were no regional wall motion   abnormalities. Impressions: - No cardiac source of emboli was indentified.  PHYSICAL EXAM  Temp:  [97.3 F (36.3 C)-98.2 F (36.8 C)] 97.7 F (36.5 C) (09/21 1213) Pulse Rate:  [66-90] 81 (09/21 0633) Resp:  [15-22] 18 (09/21 9629) BP: (85-145)/(44-92) 101/76 (09/21 5284) SpO2:  [94 %-98 %] 97 % (09/21 1324) Weight:  [401 kg] 108 kg (09/20 2047)  General - Morbid obesity, well developed, in no apparent distress.  Ophthalmologic - fundi not visualized due to noncooperation.  Cardiovascular - Regular rate and rhythm.  Mental Status -  Level of arousal and orientation to time, place, and person were intact. Language including expression, naming, repetition, comprehension was assessed and found intact. Fund of Knowledge was assessed and was intact.  Cranial Nerves II - XII - II - Visual field intact OU. III, IV, VI - Extraocular movements intact. V  - Facial sensation intact bilaterally. VII - mild left facial droop. VIII - Hearing & vestibular intact bilaterally. X - Palate elevates symmetrically. XI - Chin turning & shoulder shrug intact bilaterally. XII - Tongue protrusion intact.  Motor Strength - The patient's strength was normal in all extremities and pronator drift was absent except left hand grip 4/5.  Bulk was normal and fasciculations were absent.   Motor Tone - Muscle tone was assessed at the neck and appendages and was normal.  Reflexes - The patient's reflexes were symmetrical in all extremities and she had no pathological reflexes.  Sensory - Light touch, temperature/pinprick were assessed and were symmetrical.    Coordination - The patient had normal movements in the hands with no ataxia or dysmetria.  Tremor was absent.  Gait and Station - deferred.     ASSESSMENT/PLAN Ms. Cova Knieriem is a 52 y.o. female with history of hypertension, hyperlipidemia, and diabetes mellitus presenting with a headache, left facial weakness and slurred speech. She did not receive IV t-PA.   Stroke:  Right BG/CR/external capsule infarcts, most likely due to small/large vessel disease given multiple uncontrolled risk factors.   Resultant  Mild left facial droop and left hand grip weakness  CT head - Negative non contrasted CT appearance of the brain.   MRI head - Small acute/early subacute infarct involving the right putamen, posterior limb of internal capsule, and caudate body.  MRA head - not performed  CTA H&N - Negative for large vessel occlusion, but positive for multiple moderate to severe intracranial 1st and 2nd order arterial stenoses: bilateral PCA P1/P2 junctions (moderate to severe) - distal Left MCA M1 (severe).  2D Echo  - EF 55 - 60%. No cardiac source of emboli identified.    LDL - 126  HgbA1c - 11.7  VTE prophylaxis - Lovenox  Diet - Heart healthy / carb modified with thin liquids.  aspirin 325 mg  daily prior to admission, now on aspirin 325 mg daily and clopidogrel 75 mg daily. Recommend to continue DAPT for 3 months and then plavix alone given intracranial stenosis  Patient counseled to be compliant with her antithrombotic medications  Ongoing aggressive stroke risk factor management  Therapy recommendations: OT follow-up.  Outpatient physical therapy recommended.  Disposition:  Pending  Intracranial stenosis  CTA head and neck showed left MCA distal stenosis, b/l P1/P2 junction stenosis  Asymptomatic at this time  Likely due to uncontrolled risk factors  No need IR consultation at this time  Continue best medical management  If symptomatic recurrent stroke, may consider IR at that time.  Hypertension  Stable - low at times . Permissive hypertension (OK if < 220/120) but gradually  normalize in 5-7 days . Long-term BP goal normotensive  Hyperlipidemia  Lipid lowering medication PTA:  Mevacor 20 mg daily  LDL 126, goal < 70  Current lipid lowering medication: Lipitor 80 mg daily  Continue statin at discharge  Diabetes  HgbA1c 11.7, goal < 7.0  Uncontrolled  SSI  CBG monitoring  Close PCP follow up to better control DM  Other Stroke Risk Factors  Obesity, Body mass index is 40.87 kg/m., recommend weight loss, diet and exercise as appropriate   Possible OSA - need outpt sleep study  Other Active Problems  hyperglycemia   Hospital day # 0  Neurology will sign off. Please call with questions. Pt will follow up with stroke clinic NP at [facility] in about 4 weeks. Thanks for the consult.  Rosalin Hawking, MD PhD Stroke Neurology 08/19/2018 3:20 PM    To contact Stroke Continuity provider, please refer to [email or web address] After hours, contact General Neurology

## 2018-08-19 NOTE — H&P (Signed)
Date: 08/19/2018               Patient Name:  Cindy [name] MRN: [id number]  DOB: Sep 20, 1966 Age / Sex: 52 y.o., female   PCP: Lin Landsman, MD         Medical Service: Internal Medicine Teaching Service         Attending Physician: Dr. Bartholomew Crews, MD    First Contact: Dr. Koleen Distance Pager: [phone]  Second Contact: Dr. Berline Lopes Pager: [phone]       After Hours (After 5p/  First Contact Pager: [phone]  weekends / holidays): Second Contact Pager: [phone]   Chief Complaint: Headache  History of Present Illness:  Cindy [name] is 52 yo female w/PMH of HTN, uncontrolled TIIDM w/peripheral neuropathy, HLD, and GERD who presented with to the [facility] ED with sudden onset headache and slurred speech that she and a coworker noticed around 8pm earlier this evening. Patient states this has never happened before. She states the headache came on suddenly and was located around her temples bilaterally. She stated she did notice some changes in vision and decreased sensation around her lips as well. She denies generalized or focal weakness, dizziness, or numbness or tingling besides her usual neuropathy in her lower extremities. She does not feel like her slurred speech has gotten any better since the initial change earlier today.  She states she has swelling in her feet but believes this is controlled by her medications. She was accompanied by her daughter who she lives at home with. She provided some of the history as well and disagrees, stating that her HCTZ is not controlling her swelling. Of note, the patient ran out of her lisinopril-HCTZ one week ago and only started taking it again yesterday.  She denies recent illness, chest pain, sob, dysuria, changes in BM, or nausea.  Patient stated that she fell asleep at her desk after her stroke. Her daughter stated she frequently falls asleep sitting up. She states her mother also snores and will frequently suddenly sound like she's stopped  breathing before she starts again.   In the ED blood pressure was 142/77, all other vitals wnl. She received benadryl and reglan 10mg  for her headache and .5 L NS bolus.    Meds:  Current Meds  Medication Sig  . acetaminophen (TYLENOL) 500 MG tablet Take 1,500 mg by mouth every 6 (six) hours as needed for mild pain.  Marland Kitchen aspirin 325 MG tablet Take 325 mg by mouth daily.  Marland Kitchen lisinopril-hydrochlorothiazide (PRINZIDE,ZESTORETIC) 20-25 MG tablet Take 1 tablet by mouth daily.  Marland Kitchen lovastatin (MEVACOR) 20 MG tablet Take 20 mg by mouth at bedtime.  . metFORMIN (GLUCOPHAGE) 1000 MG tablet Take 1,000 mg by mouth 2 (two) times daily with a meal.  . TRESIBA FLEXTOUCH 100 UNIT/ML SOPN FlexTouch Pen Inject 60 Units into the skin daily.     Allergies: Allergies as of 08/18/2018 - Review Complete 08/18/2018  Allergen Reaction Noted  . Other  10/31/2015   Past Medical History:  Diagnosis Date  . Diabetes mellitus   . GERD (gastroesophageal reflux disease)   . Hyperlipemia   . Hypertension     Family History:  Family History  Problem Relation Age of Onset  . Diabetes Mother   . Hypertension Mother   . Breast cancer Mother   . Hypertension Father   . Heart failure Father   . Breast cancer Maternal Grandmother   . Colon cancer Neg Hx   . Stomach  cancer Neg Hx   . Rectal cancer Neg Hx   . Esophageal cancer Neg Hx   . Liver cancer Neg Hx      Social History:  Patient has never used tobacco and does not use alcohol use.  She lives at home with her daughter   Review of Systems: A complete ROS was negative except as per HPI.   Physical Exam: Blood pressure 134/67, pulse 81, temperature 98.2 F (36.8 C), temperature source Oral, resp. rate 20, height 5\' 4"  (1.626 m), weight 108 kg, SpO2 98 %.   Constitution: lying supine in bed, NAD, obese HENT: [place]/AT, moist mucous membranes Eyes: EOM intact, no scleral icterus, PERRLA Cardio: regular rate & rhythm, no m/r/g Respiratory: clear to  auscultation, no wheezing rales or rhonchi, +pedal pulses Abdominal: +bs, soft, non-distended, NTTP MSK: strength 5/5 symmetrical UE and LE bilaterally, sensations intact Neuro: A&Ox3, slowed and slurred speech, left sided facial droop, other CN II-XII intact, no dysmetria  Skin: trace pitting edema    EKG: personally reviewed my interpretation is normal sinus rhythm   CT Head: IMPRESSION: Negative non contrasted CT appearance of the brain. Electronically Signed   By: Donavan Foil M.D.   On: 08/18/2018 22:02  MRI Head: IMPRESSION: 1. Small acute/early subacute infarct involving the right putamen, posterior limb of internal capsule, and caudate body. No associated hemorrhage or mass effect. 2. No evidence of dural venous sinus thrombosis. 3. Mild chronic microvascular ischemic changes of the brain. Electronically Signed   By: Kristine Garbe M.D.   On: 08/19/2018 00:17   Assessment & Plan by Problem: Active Problems:   Acute CVA (cerebrovascular accident) [facility])  52yo female with PMH of HTN, HLD, TIIDM, and GERD who presented to [facility] ED with left sided facial droop, slurred and slowed speech with MRI positive for acute infarct.    CVA: CT negative. MRI shows infarct of the right putamen, internal capsule & caudate. Deficits include slurred speech and mild left-sided facial droop. She takes asa 325mg  at home and lovastatin 20mg . She is young for stroke, but has high risk factors due to her uncontrolled TIIDM, likely OSA, and possibly incomplete compliance with her medications as she recently ran out of her lisinopril-HCTZ for the past week. Troponin negative, HDL 32, LDL 126. A1C 11.2   - neurology consulted - switch to atorvastatin 80 mg - permissive HTN - cont. Asa 325 mg  - NPO - echo ordered  - admit to telemetry  - SLP, PT/OT   Uncontrolled TIIDM: Glucose 392. A1c 11.2. On Tresiba & metformin at home   - SSI moderate  - Lantus 35 U qhs - qid  CBG  Acute Normocytic Anemia: Hgb 11.2 on admission. No history of anemia.    Fatigue: With her increased fatigue, snoring, and intermittent cessation in breathing during sleep per her daughter she likely has OSA and will need outpatient sleep study with her PCP.   IVF: none VTE: lovenox Diet: NPO Code: Full   Dispo: Admit patient to Observation with expected length of stay less than 2 midnights.  SignedMarty Heck, DO 08/19/2018, 2:33 AM Pager: [phone]

## 2018-08-20 DIAGNOSIS — R29810 Facial weakness: Secondary | ICD-10-CM | POA: Diagnosis not present

## 2018-08-20 DIAGNOSIS — I6381 Other cerebral infarction due to occlusion or stenosis of small artery: Secondary | ICD-10-CM | POA: Diagnosis not present

## 2018-08-20 DIAGNOSIS — Z7902 Long term (current) use of antithrombotics/antiplatelets: Secondary | ICD-10-CM

## 2018-08-20 DIAGNOSIS — G4733 Obstructive sleep apnea (adult) (pediatric): Secondary | ICD-10-CM | POA: Diagnosis not present

## 2018-08-20 DIAGNOSIS — E119 Type 2 diabetes mellitus without complications: Secondary | ICD-10-CM | POA: Diagnosis not present

## 2018-08-20 LAB — GLUCOSE, CAPILLARY
Glucose-Capillary: 187 mg/dL — ABNORMAL HIGH (ref 70–99)
Glucose-Capillary: 232 mg/dL — ABNORMAL HIGH (ref 70–99)

## 2018-08-20 MED ORDER — CLOPIDOGREL BISULFATE 75 MG PO TABS: 75.0000 mg | ORAL_TABLET | Freq: Every day | ORAL | 1 refills | Status: DC

## 2018-08-20 MED ORDER — ASPIRIN 81 MG PO TABS: 325.0000 mg | ORAL_TABLET | Freq: Every day | ORAL | 0 refills | Status: DC

## 2018-08-20 MED ORDER — INSULIN ASPART PROT & ASPART (70-30 MIX) 100 UNIT/ML ~~LOC~~ SUSP: 30.0000 [IU] | Freq: Two times a day (BID) | SUBCUTANEOUS | 5 refills | Status: DC

## 2018-08-20 MED ORDER — ATORVASTATIN CALCIUM 80 MG PO TABS: 80.0000 mg | ORAL_TABLET | Freq: Every day | ORAL | 1 refills | Status: DC

## 2018-08-20 MED ORDER — STROKE: EARLY STAGES OF RECOVERY BOOK
Freq: Once | Status: AC
  Administered 2018-08-20: 12:00:00
  Filled 2018-08-20: qty 1

## 2018-08-20 MED ORDER — INSULIN SYRINGES (DISPOSABLE) U-100 0.5 ML MISC: 30.0000 [IU] | Freq: Two times a day (BID) | 3 refills | Status: DC

## 2018-08-20 NOTE — Progress Notes (Signed)
   Subjective: The patient was lying in her bed today upon entering the room. She denied acute concerns. Patients daughter again reiterated that the patient experiences regular episodes of gasping at night and desaturation events on the monitor which resolve when she is aroused from sleep.   Objective:  Vital signs in last 24 hours: Vitals:   08/19/18 1521 08/19/18 2016 08/20/18 0038 08/20/18 0358  BP: 127/77 132/81 (!) 142/83 (!) 141/79  Pulse: 75 89 81 74  Resp: 20 (!) 21 16 16   Temp: 97.6 F (36.4 C) 97.7 F (36.5 C) 98.6 F (37 C) 98.2 F (36.8 C)  TempSrc: Oral Oral Oral Oral  SpO2: 99% 96% 99% 100%  Weight:      Height:       Physical exam: General: A/O x4, no acute distress, afebrile, nondiaphoretic Neuro: Patient's deficits appear to improve notably with near resolution of the facial droop and numbness Cardio: RRR, no murmurs rubs or gallops auscultated Pulmonary: CTA bilaterally MSK: Strength 5 out of 5 upper and lower but bilaterally equal  Assessment/Plan:  Active Problems:   Acute CVA (cerebrovascular accident) ([place])  Acute CVA: Neurology signed off the prior day with the following recommendation; continue DAPT x3 months and Plavix indefinitely given the patient's intracranial stenosis.  In addition, they recommended PT follow-up as an outpatient.  Patient is medically cleared for discharge today pending evaluation by case management for medication assistance. -Continue atorvastatin 80 mg daily -Continue products 75 mg and ASA 81 mg daily - Follow-up with neurology in 4 weeks -Risk factor modification with close evaluation, hypertension control diabetes control.  Diabetes mellitus type 2: Patient's serum glucose mildly well-controlled with basal bolus.  Patient would benefit from long-acting insulin on discharge.  She will however, require assistance as her co-pay was currently $700 out of pocket. -Continue Lantus at 40 units daily - Recommend 5 units 3 times  daily with meals of insulin aspart  OSA: Patient appears to have notable severe obstructive sleep apnea.  She will require outpatient sleep study prior to treatment.  Patient has been advised to arrange this with her primary care provider as soon as possible  Dispo: Anticipated discharge in approximately 0 day(s).   Kathi Ludwig, MD 08/20/2018, 6:59 AM Pager: Pager# [phone]

## 2018-08-20 NOTE — Progress Notes (Signed)
Occupational Therapy Treatment Patient Details Name: Cindy [name] MRN: [id number] DOB: Jan 20, 1966 Today's Date: 08/20/2018    History of present illness Cindy [name] is a 52 y.o. female with a history of diabetes, hyperlipidemia, hypertension who had a headache over the course the day intermittently and then developed left facial weakness, LUE weakness, and slurred speech sometime between 7 and 8 PM.  MRI showed R small infarct of right putamen, internal capsule and caudate.   OT comments  Patient progressing well. Demonstrating ability to complete toilet transfers and mobility with supervision today, min guard for simulated tub transfer and reviewing safe technique and use of 3:1 as shower chair. Provided HEPs for theraputty and [facility] of L hand, continued encouraged functional use of L UE to promote increased proximal strength (ie reaching into cabinets, folding clothes, etc).  Patient dcing home with supportive daughter, at this time all needs met and no further questions or concerns. Will continue to follow while admitted.    Follow Up Recommendations  No OT follow up;Supervision - Intermittent    Equipment Recommendations  3 in 1 bedside commode    Recommendations for Other Services PT consult    Precautions / Restrictions Precautions Precautions: None Restrictions Weight Bearing Restrictions: No       Mobility Bed Mobility               General bed mobility comments: seated OOB in recliner   Transfers Overall transfer level: Needs assistance Equipment used: None Transfers: Sit to/from Stand Sit to Stand: Supervision         General transfer comment: supervision for safety    Balance Overall balance assessment: Needs assistance Sitting-balance support: No upper extremity supported;Feet supported Sitting balance-Leahy Scale: Good     Standing balance support: No upper extremity supported;During functional activity Standing balance-Leahy Scale:  Fair Standing balance comment: supervision for safety                           ADL either performed or assessed with clinical judgement   ADL Overall ADL's : Needs assistance/impaired     Grooming: Supervision/safety;Standing       Lower Body Bathing: Supervison/ safety;Sit to/from stand Lower Body Bathing Details (indicate cue type and reason): reviewed safety with bathing seated in shower          Toilet Transfer: Supervision/safety;Ambulation(simulated to recliner )       Tub/ Shower Transfer: Tub transfer;Minimal assistance;Cueing for safety;3 in 1;Ambulation Tub/Shower Transfer Details (indicate cue type and reason): reviewed safety with transfer, stepping over simulated threshold with supervion (educated on use of 3:1 a shower seat) Functional mobility during ADLs: Supervision/safety General ADL Comments: noted improved safety, problem solving and activity tolerance     Vision       Perception     Praxis      Cognition Arousal/Alertness: Awake/alert Behavior During Therapy: WFL for tasks assessed/performed Overall Cognitive Status: Within Functional Limits for tasks assessed                                          Exercises Exercises: Other exercises Other Exercises Other Exercises: reviewed fine motor coordination exercises for home, provided HEP Other Exercises: provided soft red theraputty, and reviewed HEP; completing exercises of: gross grasp, flattening, rolling, pinch, pinch and pull    Shoulder Instructions  General Comments daughter present and supportive    Pertinent Vitals/ Pain       Pain Assessment: No/denies pain  Home Living                                          Prior Functioning/Environment              Frequency  Min 2X/week        Progress Toward Goals  OT Goals(current goals can now be found in the care plan section)  Progress towards OT goals: Progressing  toward goals  Acute Rehab OT Goals Patient Stated Goal: to feel better and go home  OT Goal Formulation: With patient Time For Goal Achievement: 09/02/18 Potential to Achieve Goals: Good  Plan Discharge plan remains appropriate;Frequency remains appropriate    Co-evaluation                 AM-PAC PT "6 Clicks" Daily Activity     Outcome Measure   Help from another person eating meals?: None Help from another person taking care of personal grooming?: None Help from another person toileting, which includes using toliet, bedpan, or urinal?: None Help from another person bathing (including washing, rinsing, drying)?: None Help from another person to put on and taking off regular upper body clothing?: None Help from another person to put on and taking off regular lower body clothing?: A Little 6 Click Score: 23    End of Session Equipment Utilized During Treatment: Gait belt  OT Visit Diagnosis: Unsteadiness on feet (R26.81);Other abnormalities of gait and mobility (R26.89);Muscle weakness (generalized) (M62.81);Other symptoms and signs involving the nervous system (R29.898);Other symptoms and signs involving cognitive function;Hemiplegia and hemiparesis Hemiplegia - Right/Left: Left Hemiplegia - dominant/non-dominant: Non-Dominant Hemiplegia - caused by: Cerebral infarction   Activity Tolerance Patient tolerated treatment well   Patient Left in chair;with family/visitor present   Nurse Communication Mobility status        Time: 0940-7680 OT Time Calculation (min): 18 min  Charges: OT General Charges $OT Visit: 1 Visit OT Treatments $Self Care/Home Management : 8-22 mins  Delight Stare, [place] Pager [phone] Office [phone]    Delight Stare 08/20/2018, 12:11 PM

## 2018-08-20 NOTE — Discharge Summary (Signed)
Name: Cindy [name] MRN: [id number] DOB: 05-21-1966 52 y.o. PCP: Lin Landsman, MD  Date of Admission: 08/18/2018  8:43 PM Date of Discharge: 08/20/2018 Attending Physician:Butcher, Real Cons, MD  Discharge Diagnosis: 1. CVA (Cerebrovascular accident)  2. Diabetes Mellitus Type 2  Discharge Medications: Allergies as of 08/20/2018      Reactions   Other    Hives to an unknown medication given in 2012 at [facility]       Medication List    STOP taking these medications   acetaminophen 500 MG tablet Commonly known as:  TYLENOL   hydrochlorothiazide 25 MG tablet Commonly known as:  HYDRODIURIL   insulin detemir 100 UNIT/ML injection Commonly known as:  LEVEMIR   lisinopril 20 MG tablet Commonly known as:  PRINIVIL,ZESTRIL   lovastatin 20 MG tablet Commonly known as:  MEVACOR   predniSONE 20 MG tablet Commonly known as:  DELTASONE   TRESIBA FLEXTOUCH 100 UNIT/ML Sopn FlexTouch Pen Generic drug:  insulin degludec     TAKE these medications   aspirin 81 MG tablet Take 4 tablets (325 mg total) by mouth daily. What changed:  medication strength   atorvastatin 80 MG tablet Commonly known as:  LIPITOR Take 1 tablet (80 mg total) by mouth daily at 6 PM.   clopidogrel 75 MG tablet Commonly known as:  PLAVIX Take 1 tablet (75 mg total) by mouth daily.   insulin aspart protamine- aspart (70-30) 100 UNIT/ML injection Commonly known as:  NOVOLOG MIX 70/30 Inject 0.3 mLs (30 Units total) into the skin 2 (two) times daily with a meal.   Insulin Syringes (Disposable) U-100 0.5 ML Misc 30 Units by Does not apply route 2 (two) times daily with a meal.   lisinopril-hydrochlorothiazide 20-25 MG tablet Commonly known as:  PRINZIDE,ZESTORETIC Take 1 tablet by mouth daily.   metFORMIN 1000 MG tablet Commonly known as:  GLUCOPHAGE Take 1,000 mg by mouth 2 (two) times daily with a meal.       Disposition and follow-up:   Cindy [name] was discharged from  [facility] in Stable condition.  At the hospital follow up visit please address:  1.  CVA: patient with new neurological deficits, resolved by discharge. TO continue Plavix 75mg  daily and ASA 81mg  daily for three months, then Plavix alone. Follow up with neurology in 4-6 weeks.      DMII: Discharged on 30U of 70/30 insulin. Please assess for appropriateness of dose and adherence.   2.  Labs / imaging needed at time of follow-up: n/a  3.  Pending labs/ test needing follow-up: n/a  Follow-up Appointments: Follow-up Information    [facility]. Schedule an appointment as soon as possible for a visit in 4 week(s).   Specialty:  Radiology Contact information: [address] [place] [phone]       Lin Landsman, MD. Schedule an appointment as soon as possible for a visit in 1 week(s).   Specialty:  Family Medicine Contact information: [place] [address] [phone]        [facility] Follow up.   Specialty:  Rehabilitation Why:  You should get a call by Friday to schedule your outpatient therapy. If you do not, please call Contact information: [address] [place] [facility] Course by problem list: 1. CVA: Cindy [name] id a pleasant  52 yo female with a PMHx notable for insulin dependent diabetes mellitus, HTN, HLD who presented to the ED for headache and slurred speech. She was noted to have an acute ischemic small vessel stroke on MRI. She was not a candidate for TPA given the time frame and limitation in symptoms. He symptoms had improved by discharge and were nearly resolved at that time. Patient was treated with antiplatelet therapy, atorvastatin and is to follow-up with neurology in 4 weeks.   2. Diabetes mellitus type 2: Patient's serum glucose mildly  well-controlled with basal bolus insulin while admitted. However, due to affordability concerns, she was in agreement with being discharged on 70/30 insulin. Please discuss insulin options with the patient.   3. OSA: Patient appears to have notable severe obstructive sleep apnea.  She will require outpatient sleep study prior to treatment.  Patient had a referral placed for a sleep study by neurology.   Discharge Vitals:   BP (!) 143/91 (BP Location: Right Arm)   Pulse 79   Temp 97.8 F (36.6 C) (Oral)   Resp 16   Ht 5\' 4"  (1.626 m)   Wt 108 kg   SpO2 95%   BMI 40.87 kg/m   Pertinent Labs, Studies, and Procedures:  CBC Latest Ref Rng & Units 08/19/2018 08/18/2018 06/20/2018  WBC 4.0 - 10.5 K/uL 10.0 - -  Hemoglobin 12.0 - 15.0 g/dL 11.3(L) 12.6 12.6  Hematocrit 36.0 - 46.0 % 36.2 37.0 37.0  Platelets 150 - 400 K/uL 315 - -   CMP Latest Ref Rng & Units 08/19/2018 08/18/2018 06/20/2018  Glucose 70 - 99 mg/dL 308(H) 392(H) 146(H)  BUN 6 - 20 mg/dL 19 22(H) 13  Creatinine 0.44 - 1.00 mg/dL 0.84 0.90 0.60  Sodium 135 - 145 mmol/L 137 136 140  Potassium 3.5 - 5.1 mmol/L 3.7 3.6 4.0  Chloride 98 - 111 mmol/L 103 98 101  CO2 22 - 32 mmol/L 25 - -  Calcium 8.9 - 10.3 mg/dL 8.5(L) - -  Total Protein 6.5 - 8.1 g/dL 6.5 - -  Total Bilirubin 0.3 - 1.2 mg/dL 0.6 - -  Alkaline Phos 38 - 126 U/L 68 - -  AST 15 - 41 U/L 17 - -  ALT 0 - 44 U/L 12 - -   CT angio head/neck: IMPRESSION: 1. Negative for large vessel occlusion, but positive for multiple moderate to severe intracranial 1st and 2nd order arterial stenoses: - bilateral PCA P1/P2 junctions (moderate to severe). - distal Left MCA M1 (severe). - Right MCA posterior M2 origin (moderate). 2. 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 See series 8 image 121. Neuro-Interventional Radiology consultation is suggested to evaluate the appropriateness of potential treatment. Non-emergent  evaluation can be arranged by calling [phone] during usual hours. Emergency evaluation can be requested by paging [phone]. 3. No significant arterial stenosis in the neck. Highly tortuous cervical ICAs. 4. Subtle evidence of the acute small vessel ischemia in the posterior right deep gray matter on CT. No new No acute intracranial abnormality.  MRI Brain: IMPRESSION: 1. Small acute/early subacute infarct involving the right putamen, posterior limb of internal capsule, and caudate body. No associated hemorrhage or mass effect. 2. No evidence of dural venous sinus thrombosis. 3. Mild chronic microvascular ischemic changes of the brain.  Discharge Instructions: Discharge Instructions    Ambulatory referral to Neurology   Complete by:  As directed    Follow up with stroke clinic NP Janett Billow  [place] or Cecille Rubin, if both not available, consider Zachery Dauer, or [place]) at [facility] in about 4 weeks. Thanks.   Ambulatory referral to Physical Therapy   Complete by:  As directed    Ambulatory referral to Sleep Studies   Complete by:  As directed    Call MD for:  persistant dizziness or light-headedness   Complete by:  As directed    Call MD for:  persistant nausea and vomiting   Complete by:  As directed    Diet - low sodium heart healthy   Complete by:  As directed    Discharge instructions   Complete by:  As directed    An referral for a sleep study with [facility] has been placed. Please be sure to respond to the scheduling call as timely as possible.  The neurologist has requested that you continue the aspirin at 81mg  daily for three months along with the plavix 75mg  daily. You will stop the aspirin after three months but continue the plavix 75mg  daily. Please discuss any questions with the neurologist at your outpatient follow-up visit with them in 4-6 weeks.   We have prescribed insulin 70/30 mixed at 30units twice daily with your largest meals, preferably  split evenly as possible. Please monitor for signs of low blood glucose with dizziness, confusion, sweating, or weakness. It is important to continue to monitor your glucose as well. Please discuss the changes with your primary doctor. We hope that the change in medication to a more affordable option with improve your A1c and decrease your risk of a second stroke.   Increase activity slowly   Complete by:  As directed       Signed: Kathi Ludwig, MD 08/24/2018, 11:38 AM   Pager: Pager# [phone]

## 2018-08-20 NOTE — Care Management Note (Signed)
Case Management Note  Patient Details  Name: Maizee Reinhold MRN: [id number] Date of Birth: 13-Oct-1966  Subjective/Objective:                    Action/Plan:  Spoke w patient and family at bedside.  OP PT referral placed to Neuro rehab, family and patient appreciative. Requesting 3/1 as rec by PT, order placed and requested from [facility]. It will be delivered to room prior to DC. Discussed meds, costs. Patient satisfied with MD changing to 70/30 and does not express any concerns with medication affordability going foward. Expected Discharge Date:  08/20/18               Expected Discharge Plan:  Home/Self Care  In-House Referral:     Discharge planning Services  CM Consult  Post Acute Care Choice:    Choice offered to:     DME Arranged:    3/1 bedside commode DME Agency:    [facility]  HH Arranged:    [place] Agency:     Status of Service:  Completed, signed off  If discussed at [employer], dates discussed:    Additional Comments:  Carles Collet, RN 08/20/2018, 10:41 AM

## 2018-08-20 NOTE — Progress Notes (Signed)
O2 sat dropping into 80s, applied O2 2L [place].  Patient snoring loudly & deeply asleep.  Daughter stated that she needs to agree to a sleep study as outpatient.  Once patient is awake, her sats return to 96-100%.

## 2018-08-20 NOTE — Progress Notes (Signed)
Discharge teaching complete. Meds, diet, activity, follow up appointments reviewed and all questions answered. Copy of instructions given to patient and prescriptions sent to pharmacy.  

## 2018-08-25 ENCOUNTER — Ambulatory Visit: Payer: BLUE CROSS/BLUE SHIELD | Admitting: Neurology

## 2018-08-25 ENCOUNTER — Encounter: Payer: Self-pay | Admitting: Neurology

## 2018-08-25 VITALS — BP 136/82 | HR 80 | Ht 63.0 in | Wt 247.0 lb

## 2018-08-25 DIAGNOSIS — R0683 Snoring: Secondary | ICD-10-CM | POA: Diagnosis not present

## 2018-08-25 DIAGNOSIS — R519 Headache, unspecified: Secondary | ICD-10-CM

## 2018-08-25 DIAGNOSIS — R400 Somnolence: Secondary | ICD-10-CM

## 2018-08-25 DIAGNOSIS — R471 Dysarthria and anarthria: Secondary | ICD-10-CM

## 2018-08-25 DIAGNOSIS — R351 Nocturia: Secondary | ICD-10-CM

## 2018-08-25 DIAGNOSIS — Z6841 Body Mass Index (BMI) 40.0 and over, adult: Secondary | ICD-10-CM

## 2018-08-25 DIAGNOSIS — Z8673 Personal history of transient ischemic attack (TIA), and cerebral infarction without residual deficits: Secondary | ICD-10-CM

## 2018-08-25 DIAGNOSIS — G8194 Hemiplegia, unspecified affecting left nondominant side: Secondary | ICD-10-CM

## 2018-08-25 DIAGNOSIS — R51 Headache: Secondary | ICD-10-CM

## 2018-08-25 NOTE — Patient Instructions (Signed)

## 2018-08-25 NOTE — Progress Notes (Signed)
Subjective:    Patient ID: Cindy [name] is a 52 y.o. female.  HPI     Star Age, MD, PhD [facility] [address] [place]. [place] [address]  Dear Cornelius Moras,   I saw your patient, Cindy [name], upon your kind request in my sleep clinic today for initial consultation of her sleep disorder, in particular, concern for underlying obstructive sleep apnea. The patient is accompanied today her daughter today. As you know, Ms. Quinonez is a 52 year old right-handed woman with an underlying medical history of hypertension, hyperlipidemia, diabetes, recent stroke with admission in September 2019, and morbid obesity with a BMI of 40, who reports snoring and excessive daytime somnolence and was noted to have witnessed apneas while in the hospital. I reviewed her hospital records from her admission and discharge from 08/20/2018. MRI brain and MRV head without contrast on 08/18/2018 showed: IMPRESSION: 1. Small acute/early subacute infarct involving the right putamen, posterior limb of internal capsule, and caudate body. No associated hemorrhage or mass effect. 2. No evidence of dural venous sinus thrombosis. 3. Mild chronic microvascular ischemic changes of the brain.  She is supposed to have appointments for neuro rehabilitation for speech therapy, occupational and physical therapy, and also follow-up in stroke clinic. Her weakness has improved a little bit. Speech is still scant and difficult to understand at times per daughter. Patient snores and has apneic pauses while asleep, her Epworth sleepiness score is 19 out of 24, fatigue score is 51 out of 63. She is a nonsmoker and does not drink alcohol, does not currently drink any soda. She lives with her daughter. She has 2 children, works at a group home, currently not driving since the stroke. Her bedtime is generally around 9, rise time around 8. She has nocturia about 3-4 times per average night,  has had occasional morning headaches. Weight has been fairly stable.  Her Past Medical History Is Significant For: Past Medical History:  Diagnosis Date  . Diabetes mellitus   . GERD (gastroesophageal reflux disease)   . Hyperlipemia   . Hypertension     Her Past Surgical History Is Significant For: Past Surgical History:  Procedure Laterality Date  . None      Her Family History Is Significant For: Family History  Problem Relation Age of Onset  . Diabetes Mother   . Hypertension Mother   . Breast cancer Mother   . Hypertension Father   . Heart failure Father   . Breast cancer Maternal Grandmother   . Colon cancer Neg Hx   . Stomach cancer Neg Hx   . Rectal cancer Neg Hx   . Esophageal cancer Neg Hx   . Liver cancer Neg Hx     Her Social History Is Significant For: Social History   Socioeconomic History  . Marital status: Single    Spouse name: Not on file  . Number of children: 2  . Years of education: Not on file  . Highest education level: Not on file  Occupational History  . Occupation: [employer]- [place]  . Financial resource strain: Not on file  . Food insecurity:    Worry: Not on file    Inability: Not on file  . Transportation needs:    Medical: Not on file    Non-medical: Not on file  Tobacco Use  . Smoking status: Never Smoker  . Smokeless tobacco: Never Used  Substance and Sexual Activity  . Alcohol  use: No  . Drug use: No  . Sexual activity: Never  Lifestyle  . Physical activity:    Days per week: Not on file    Minutes per session: Not on file  . Stress: Not on file  Relationships  . Social connections:    Talks on phone: Not on file    Gets together: Not on file    Attends religious service: Not on file    Active member of club or organization: Not on file    Attends meetings of clubs or organizations: Not on file    Relationship status: Not on file  Other Topics Concern  . Not on file  Social History Narrative   . Not on file    Her Allergies Are:  Allergies  Allergen Reactions  . Other     Hives to an unknown medication given in 2012 at [facility]   :   Her Current Medications Are:  Outpatient Encounter Medications as of 08/25/2018  Medication Sig  . aspirin 81 MG tablet Take 4 tablets (325 mg total) by mouth daily.  Marland Kitchen atorvastatin (LIPITOR) 80 MG tablet Take 1 tablet (80 mg total) by mouth daily at 6 PM.  . clopidogrel (PLAVIX) 75 MG tablet Take 1 tablet (75 mg total) by mouth daily.  . Insulin Syringes, Disposable, U-100 0.5 ML MISC 30 Units by Does not apply route 2 (two) times daily with a meal.  . metFORMIN (GLUCOPHAGE) 1000 MG tablet Take 1,000 mg by mouth 2 (two) times daily with a meal.  . insulin aspart protamine- aspart (NOVOLOG MIX 70/30) (70-30) 100 UNIT/ML injection Inject 0.3 mLs (30 Units total) into the skin 2 (two) times daily with a meal. (Patient not taking: Reported on 08/25/2018)  . [DISCONTINUED] lisinopril-hydrochlorothiazide (PRINZIDE,ZESTORETIC) 20-25 MG tablet Take 1 tablet by mouth daily.   No facility-administered encounter medications on file as of 08/25/2018.   :  Review of Systems:  Out of a complete 14 point review of systems, all are reviewed and negative with the exception of these symptoms as listed below: Review of Systems  Neurological:       Pt presents today to discuss her sleep. Pt has never had a sleep study but does endorse snoring.  Epworth Sleepiness Scale 0= would never doze 1= slight chance of dozing 2= moderate chance of dozing 3= high chance of dozing  Sitting and reading: 3 Watching TV: 3 Sitting inactive in a public place (ex. Theater or meeting): 3 As a passenger in a car for an hour without a break: 3 Lying down to rest in the afternoon: 3 Sitting and talking to someone: 2 Sitting quietly after lunch (no alcohol): 2 In a car, while stopped in traffic: 0 Total: 19     Objective:  Neurological Exam  Physical Exam Physical  Examination:   Vitals:   08/25/18 1011  BP: 136/82  Pulse: 80    General Examination: The patient is a very pleasant 52 y.o. female in no acute distress. She appears well-developed and well-nourished and well groomed.   HEENT: Normocephalic, atraumatic, pupils are equal, round and reactive to light and accommodation. Extraocular tracking is good without limitation to gaze excursion or nystagmus noted. Normal smooth pursuit is noted. Hearing is grossly intact. Face is slightly assymmetric with scant speech, mild dysathria, tongue dev slightly to the L. LMN type L facial weakness. There is no lip, neck/head, jaw or voice tremor. Neck is supple with full range of passive and active motion.  There are no carotid bruits on auscultation. Oropharynx exam reveals: mild mouth dryness, adequate dental hygiene and marked airway crowding, due to smaller airway entry, tonsils of 3+ and longish tongue. Mallampati is class II. Tongue protrudes centrally and palate elevates symmetrically. Neck size is 16.75 inches. She has a Moderate overbite.   Chest: Clear to auscultation without wheezing, rhonchi or crackles noted.  Heart: S1+S2+0, regular and normal without murmurs, rubs or gallops noted.   Abdomen: Soft, non-tender and non-distended with normal bowel sounds appreciated on auscultation.  Extremities: There is no pitting edema in the distal lower extremities bilaterally. Pedal pulses are intact.  Skin: Warm and dry without trophic changes noted.  Musculoskeletal: exam reveals no obvious joint deformities, tenderness or joint swelling or erythema.   Neurologically:  Mental status: The patient is awake, alert and oriented in all 4 spheres. Her immediate and remote memory, attention, language skills and fund of knowledge are appropriate. There is no evidence of aphasia, agnosia, apraxia or anomia. Speech is clear with normal prosody and enunciation. Thought process is linear. Mood is normal and affect is  blunted.  Cranial nerves II - XII are as described above under HEENT exam. In addition: shoulder shrug is normal with equal shoulder height noted. Motor exam: Normal bulk, strength and tone is noted on the R, mild weakness in the left hemibody, 4+ out of 5, slightly weaker in the left grip. She stands with difficulty and has to push herself up, can walk a little bit without assistance, stands wide-based and walks a little wide-based, mild limp on the left. There is no tremor or rebound. Romberg is not tested for safety reasons. Fine motor skills and coordination: intact with normal finger taps, normal hand movements, normal rapid alternating patting, normal foot taps and normal foot agility.  Cerebellar testing: No dysmetria or intention tremor.  Sensory exam: intact to light touch.        Assessment and Plan:  In summary, Kassidi Elza is a very pleasant 51 y.o.-year old female with an underlying medical history of hypertension, hyperlipidemia, diabetes, recent stroke with admission in September 2019, and morbid obesity with a BMI of 40, whose history and physical exam are concerning for obstructive sleep apnea (OSA). I had a long chat with the patient and her daughter about my findings and the diagnosis of OSA, its prognosis and treatment options. We talked about medical treatments, surgical interventions and non-pharmacological approaches. I explained in particular the risks and ramifications of untreated moderate to severe OSA, especially with respect to developing cardiovascular disease down the Road, including congestive heart failure, difficult to treat hypertension, cardiac arrhythmias, or stroke. Even type 2 diabetes has, in part, been linked to untreated OSA. Symptoms of untreated OSA include daytime sleepiness, memory problems, mood irritability and mood disorder such as depression and anxiety, lack of energy, as well as recurrent headaches, especially morning headaches. We talked about  trying to maintain a healthy lifestyle in general, as well as the importance of weight control. I encouraged the patient to eat healthy, exercise daily and keep well hydrated, to keep a scheduled bedtime and wake time routine, to not skip any meals and eat healthy snacks in between meals. I advised the patient not to drive when feeling sleepy. I recommended the following at this time: sleep study with potential positive airway pressure titration. (We will score hypopneas at 3% and split the sleep study into diagnostic and treatment portion, if the estimated. 2 hour AHI is >20/h).  They are encouraged to make an appointment in stroke clinic and also for outpatient neuro rehabilitation. I explained the sleep test procedure to the patient and also outlined possible surgical and non-surgical treatment options of OSA, including the use of a custom-made dental device (which would require a referral to a specialist dentist or oral surgeon), upper airway surgical options, such as pillar implants, radiofrequency surgery, tongue base surgery, and UPPP (which would involve a referral to an ENT surgeon). Rarely, jaw surgery such as mandibular advancement may be considered.  I also explained the CPAP treatment option to the patient, who indicated that she would be willing to try CPAP if the need arises. I explained the importance of being compliant with PAP treatment, not only for insurance purposes but primarily to improve Her symptoms, and for the patient's long term health benefit, including to reduce Her cardiovascular risks. I answered all their questions today and the patient and her daughter were in agreement. I plan to see her back after the sleep study is completed and encouraged them to call with any interim questions, concerns, problems or updates.   Thank you very much for allowing me to participate in the care of this nice patient. If I can be of any further assistance to you please do not hesitate to call me  at [phone].  Sincerely,   Star Age, MD, PhD

## 2018-08-28 ENCOUNTER — Ambulatory Visit
Payer: BLUE CROSS/BLUE SHIELD | Attending: Family Medicine | Admitting: Rehabilitative and Restorative Service Providers"

## 2018-08-28 ENCOUNTER — Other Ambulatory Visit: Payer: Self-pay

## 2018-08-28 VITALS — BP 124/63 | HR 71

## 2018-08-28 DIAGNOSIS — R2689 Other abnormalities of gait and mobility: Secondary | ICD-10-CM | POA: Insufficient documentation

## 2018-08-28 DIAGNOSIS — M6281 Muscle weakness (generalized): Secondary | ICD-10-CM | POA: Insufficient documentation

## 2018-08-28 NOTE — Therapy (Signed)
[place] [address] [place], [place], [address] Phone: [phone]   Fax:  [phone]  Physical Therapy Evaluation  Patient Details  Name: Cindy [name] MRN: [id number] Date of Birth: 01/12/1966 Referring Provider (PT): Kathi Ludwig, MD/ Larey Dresser, MD   Encounter Date: 08/28/2018  PT End of Session - 08/28/18 1422    Visit Number  1    Number of Visits  8    Date for PT Re-Evaluation  10/12/18    Authorization Type  PT to check appointment notes- not in at eval    PT Start Time  1320    PT Stop Time  1405    PT Time Calculation (min)  45 min    Equipment Utilized During Treatment  Gait belt    Activity Tolerance  Patient tolerated treatment well    Behavior During Therapy  [facility] for tasks assessed/performed       Past Medical History:  Diagnosis Date  . Diabetes mellitus   . GERD (gastroesophageal reflux disease)   . Hyperlipemia   . Hypertension     Past Surgical History:  Procedure Laterality Date  . None      Vitals:   08/28/18 1324  BP: 124/63  Pulse: 71     Subjective Assessment - 08/28/18 1324    Subjective  The patient reports that she continues with Left sided weakness, left sided pain, and speech difficulties since sustaining a stroke on 08/18/2018.  The patient was admitted to [facility] from 08/18/2018-08/20/2018.      Pertinent History  diabetes mellitus, GERD, hyperlipidemia, HTN,    Patient Stated Goals  "Be able to walk better."    Currently in Pain?  Yes    Pain Score  6     Pain Location  Generalized    Pain Orientation  Left    Pain Descriptors / Indicators  Sharp    Pain Type  Acute pain;Neuropathic pain    Pain Onset  1 to 4 weeks ago    Aggravating Factors   lying down    Pain Relieving Factors  sitting up    Effect of Pain on Daily Activities  *No pain goal to follow due to neuropathic nature of pain/ will monitor response to treatment and modify if  needed.         [facility] PT Assessment - 08/28/18 1329      Assessment   Medical Diagnosis  R CVA (infarct of putamen, internal capsul and caudate)    Referring Provider (PT)  Kathi Ludwig, MD/ Larey Dresser, MD    Onset Date/Surgical Date  08/18/18    Hand Dominance  Right    Prior Therapy  was seen at [facility] acute care      Precautions   Precautions  Fall      Restrictions   Weight Bearing Restrictions  No      Balance Screen   Has the patient fallen in the past 6 months  No    Has the patient had a decrease in activity level because of a fear of falling?   Yes   due to recent stroke   Is the patient reluctant to leave their home because of a fear of falling?   Yes   her daughter is assisting with all movement     Home Environment   Living Environment  Private residence    [place]  Home Access  Stairs to enter    Entrance Stairs-Number of Steps  2    Entrance Stairs-Rails  None    Home Layout  One level    Home Equipment  Bedside commode      Prior Function   Level of Independence  Independent    Vocation  Full time employment    Vocation Requirements  The patient was working 2 jobs prior to stroke (one was 49 hours/ weeks and the other was 25-30 hours/week)    Leisure  busy with work      Associate Professor   Overall Cognitive Status  Difficult to assess   pt reports "no" when asked about memory change, dtr nods yes   Difficult to assess due to  --   varyng report between patient and her daughter     Observation/Other Assessments   Focus on Therapeutic Outcomes (FOTO)   n/a      Sensation   Light Touch  Impaired Detail    Light Touch Impaired Details  --   bilat feet pins and needles toes only     Posture/Postural Control   Posture/Postural Control  Postural limitations    Postural Limitations  Rounded Shoulders;Forward head      ROM / Strength   AROM / PROM / Strength  AROM;Strength      AROM    Overall AROM   Within functional limits for tasks performed    Overall AROM Comments  slowed speed of movement on the left      Strength   Overall Strength  Deficits    Overall Strength Comments  R UE is 4+/5 for shoulder flexion/abduction, R elbow flexion 5/5, R hip flexion 5/5, R knee flexion/extension 5/5, R ankle DF is 5/5.  L UE is 4/5 for shoulder flexion/abduction, 5/5 L elbow flexion, 4/5 L hip flexion, 5/5 L knee flexion/extension and 4+/5 L ankle DF.      Bed Mobility   Bed Mobility  Supine to Sit;Sit to Supine   uses a family member's hospital bed at this time   Supine to Sit  Supervision/Verbal cueing    Sit to Supine  Supervision/Verbal cueing      Transfers   Transfers  Sit to Stand;Stand to Sit    Sit to Stand  5: Supervision   daughter notes occasional imbalance with transitions     Ambulation/Gait   Ambulation/Gait  Yes    Ambulation/Gait Assistance  5: Supervision    Ambulation Distance (Feet)  100 Feet    Assistive device  None    Gait Pattern  Decreased arm swing - right;Decreased arm swing - left;Step-through pattern   slowed pace   Ambulation Surface  Level;Unlevel    Gait velocity  1.52 ft/sec    Stairs  Yes    Stairs Assistance  5: Supervision    Stair Management Technique  No rails    Number of Stairs  4      Standardized Balance Assessment   Standardized Balance Assessment  Berg Balance Test      Berg Balance Test   Sit to Stand  Able to stand  independently using hands    Standing Unsupported  Able to stand safely 2 minutes    Sitting with Back Unsupported but Feet Supported on Floor or Stool  Able to sit safely and securely 2 minutes    Stand to Sit  Sits safely with minimal use of hands    Transfers  Able to transfer safely, definite  need of hands    Standing Unsupported with Eyes Closed  Able to stand 10 seconds safely    Standing Ubsupported with Feet Together  Able to place feet together independently and stand 1 minute safely    From  Standing, Reach Forward with Outstretched Arm  Can reach confidently >25 cm (10")    From Standing Position, Pick up Object from Floor  Able to pick up shoe, needs supervision    From Standing Position, Turn to Look Behind Over each Shoulder  Looks behind from both sides and weight shifts well    Turn 360 Degrees  Able to turn 360 degrees safely but slowly    Standing Unsupported, Alternately Place Feet on Step/Stool  Able to stand independently and safely and complete 8 steps in 20 seconds    Standing Unsupported, One Foot in Front  Able to plae foot ahead of the other independently and hold 30 seconds    Standing on One Leg  Tries to lift leg/unable to hold 3 seconds but remains standing independently    Total Score  47    Berg comment:  47/56                Objective measurements completed on examination: See above findings.              PT Education - 08/28/18 1421    Education Details  Discussed walking with daughter's supervision on level surfaces to begin to return to prior level of mobility.    Person(s) Educated  Patient;Child(ren)    Methods  Explanation    Comprehension  Verbalized understanding          PT Long Term Goals - 08/28/18 1423      PT LONG TERM GOAL #1   Title  The patient will be independent with home exercise program for high level balance, general strengthening and endurance.    Time  6    Period  Weeks    Target Date  10/12/18      PT LONG TERM GOAL #2   Title  The patient will improve Berg score from 47/56 to > or equal to 52/56 to demo dec'ing risk for falls.    Time  6    Period  Weeks    Target Date  10/12/18      PT LONG TERM GOAL #3   Title  The patient will improve gait speed from 1.52 ft/sec to > or equal to 2.0 ft/sec to demo improving functional mobility.    Time  6    Period  Weeks    Target Date  10/12/18      PT LONG TERM GOAL #4   Title  The patient will ambulate on community surfaces x 500 ft without a device  independently.    Time  6    Period  Weeks    Target Date  10/12/18      PT LONG TERM GOAL #5   Title  The patient will negotiate 4 steps without a handrail and reciprocal pattern independently.    Time  6    Period  Weeks    Target Date  10/12/18             Plan - 08/28/18 1426    Clinical Impression Statement  The patient is a 52 yo female presenting to OP physical therapy s/p CVA on 08/18/2018.  She presents with slowed gait speed, decreased arm swing with gait noting a guarded posture  with walking.  She has mild L UE/LE weakness and reports nerve pain throughout the left side.  The patient's daugfhter reports she is assisting for bed mobility, transfers and ambulation at home.  PT encouraged supervision, but allowing patient to begin to perform movements on her own.   PT encouraged home walking program to increase activity level to work towards return to prior functional status.     History and Personal Factors relevant to plan of care:  diabetes mellitus, GERD, hyperlipidemia, HTN, not able to drive, not currently working since stroke    Clinical Presentation  Stable    Clinical Presentation due to:  BP stable, patient monitoring blood sugar in home, maintaining status of mobility since leaving hospital    Clinical Decision Making  Low    Rehab Potential  Good    PT Frequency  2x / week    PT Duration  2 weeks   followed by 1x/week for 4 weeks (for total of 6 weeks plan of care)   PT Treatment/Interventions  ADLs/Self Care Home Management;Balance training;Neuromuscular re-education;Patient/family education;Therapeutic exercise;Therapeutic activities;Functional mobility training;Stair training;Gait training    PT Next Visit Plan  Establish HEP:  walking program written down (try for 5 minutes nonstop 3 times/day if able), hip strengthening standing, ankle heel/toe raises standing, high level balance.  Job activity training (group home)    Consulted and Agree with Plan of Care   Patient;Family member/caregiver    Family Member Consulted  daughter       Patient will benefit from skilled therapeutic intervention in order to improve the following deficits and impairments:  Abnormal gait, Decreased endurance, Decreased activity tolerance, Decreased strength, Pain, Decreased balance, Postural dysfunction  Visit Diagnosis: Other abnormalities of gait and mobility  Muscle weakness (generalized)     Problem List Patient Active Problem List   Diagnosis Date Noted  . Cerebrovascular accident (CVA) ([place]) 08/19/2018  . OBESITY 07/01/2010  . Type 2 diabetes mellitus with complication, with long-term current use of insulin ([place]) 05/08/2008  . HYPERLIPIDEMIA 05/08/2008  . HYPERTENSION 05/08/2008    Anjanette Gilkey, PT 08/28/2018, 2:36 PM  [place] [address] [place], [place], [address] Phone: [phone]   Fax:  [phone]  Name: Grenda Lora MRN: [id number] Date of Birth: 1966-04-18

## 2018-09-06 ENCOUNTER — Encounter: Payer: Self-pay | Admitting: Physical Therapy

## 2018-09-06 ENCOUNTER — Ambulatory Visit: Payer: BLUE CROSS/BLUE SHIELD | Attending: Family Medicine | Admitting: Physical Therapy

## 2018-09-06 DIAGNOSIS — M6281 Muscle weakness (generalized): Secondary | ICD-10-CM | POA: Diagnosis present

## 2018-09-06 DIAGNOSIS — R2689 Other abnormalities of gait and mobility: Secondary | ICD-10-CM | POA: Diagnosis present

## 2018-09-06 NOTE — Therapy (Signed)
[facility] [address] [place], [place], [address] Phone: [phone]   Fax:  [phone]  Physical Therapy Treatment  Patient Details  Name: Cindy [name] MRN: [id number] Date of Birth: Mar 06, 1966 Referring Provider (PT): Kathi Ludwig, MD/ Larey Dresser, MD   Encounter Date: 09/06/2018  PT End of Session - 09/06/18 1446    Visit Number  2    Number of Visits  8    Date for PT Re-Evaluation  10/12/18    Authorization Type  BCBS    PT Start Time  1444    PT Stop Time  1529    PT Time Calculation (min)  45 min    Activity Tolerance  Patient tolerated treatment well    Behavior During Therapy  [facility] for tasks assessed/performed       Past Medical History:  Diagnosis Date  . Diabetes mellitus   . GERD (gastroesophageal reflux disease)   . Hyperlipemia   . Hypertension     Past Surgical History:  Procedure Laterality Date  . None      There were no vitals filed for this visit.  Subjective Assessment - 09/06/18 1445    Subjective  doing well - no pain - no falls    Pertinent History  diabetes mellitus, GERD, hyperlipidemia, HTN,    Patient Stated Goals  "Be able to walk better."    Currently in Pain?  No/denies    Pain Score  0-No pain                       [facility] Adult PT Treatment/Exercise - 09/06/18 0001      Exercises   Exercises  Other Exercises    Other Exercises   Nustep: L4 x 6 min with SPM at or above 45 focusing on cardiovascular endurance          Balance Exercises - 09/06/18 1452      Balance Exercises: Standing   Standing Eyes Opened  Narrow base of support (BOS);Head turns;3 reps;30 secs   discussed performing in corner   Tandem Stance  Eyes open;2 reps   1/2 tandem    Step Ups  Forward;4 inch;UE support 2   4" step + AirEx - alternating steps   Tandem Gait  Forward;4 reps;Upper extremity support   in // bars   Retro Gait  4 reps;Upper extremity  support   in // bars   Marching Limitations  alternating high knees x 30 sec    Sit to Stand Time  STS from mat table x 10 - arms crossed at chest - reports fatigue with this activity    Other Standing Exercises  alternating toe taps to 6" step standing on AirEx        PT Education - 09/06/18 1639    Education Details  discusion of walking program - increasing daily activity - patient reporting she already walks 2x/week for 30 min with encouragement to add in more daily mobility.     Person(s) Educated  Patient    Methods  Explanation;Demonstration;Handout    Comprehension  Verbalized understanding          PT Long Term Goals - 08/28/18 1423      PT LONG TERM GOAL #1   Title  The patient will be independent with home exercise program for high level balance, general strengthening and endurance.    Time  6    Period  Weeks    Target  Date  10/12/18      PT LONG TERM GOAL #2   Title  The patient will improve Berg score from 47/56 to > or equal to 52/56 to demo dec'ing risk for falls.    Time  6    Period  Weeks    Target Date  10/12/18      PT LONG TERM GOAL #3   Title  The patient will improve gait speed from 1.52 ft/sec to > or equal to 2.0 ft/sec to demo improving functional mobility.    Time  6    Period  Weeks    Target Date  10/12/18      PT LONG TERM GOAL #4   Title  The patient will ambulate on community surfaces x 500 ft without a device independently.    Time  6    Period  Weeks    Target Date  10/12/18      PT LONG TERM GOAL #5   Title  The patient will negotiate 4 steps without a handrail and reciprocal pattern independently.    Time  6    Period  Weeks    Target Date  10/12/18            Plan - 09/06/18 1448    Clinical Impression Statement  Cindy [name] doing well today - reporting continued fatigue and general slurred speech since CVA. Skilled sesion today focusing on initiating HEP targeted at LE strengthening, functional mobility and balance  activities. Patient requiring seated rest breaks in session due to fatigue. Requires at least 1 UE support during all balance activities to maintain upright balance. WIll continue to progress towards goals.     Rehab Potential  Good    PT Frequency  2x / week    PT Duration  2 weeks   followed by 1x/week for 4 weeks (for total of 6 weeks plan of care)   PT Treatment/Interventions  ADLs/Self Care Home Management;Balance training;Neuromuscular re-education;Patient/family education;Therapeutic exercise;Therapeutic activities;Functional mobility training;Stair training;Gait training    PT Next Visit Plan  hip strengthening standing, ankle heel/toe raises standing, high level balance.  Job activity training (group home)    Cindy [name] and Agree with Plan of Care  Patient;Family member/caregiver    Family Member Consulted  daughter       Patient will benefit from skilled therapeutic intervention in order to improve the following deficits and impairments:  Abnormal gait, Decreased endurance, Decreased activity tolerance, Decreased strength, Pain, Decreased balance, Postural dysfunction  Visit Diagnosis: Other abnormalities of gait and mobility  Muscle weakness (generalized)     Problem List Patient Active Problem List   Diagnosis Date Noted  . Cerebrovascular accident (CVA) ([place]) 08/19/2018  . OBESITY 07/01/2010  . Type 2 diabetes mellitus with complication, with long-term current use of insulin ([place]) 05/08/2008  . HYPERLIPIDEMIA 05/08/2008  . HYPERTENSION 05/08/2008    Lanney Gins, PT, DPT Supplemental Physical Therapist 09/06/18 4:41 PM Pager: [phone] Office: [place] [address] [place], [place], [address] Phone: [phone]   Fax:  [phone]  Name: Cindy [name] MRN: [id number] Date of Birth: 07-31-1966

## 2018-09-13 ENCOUNTER — Ambulatory Visit: Payer: BLUE CROSS/BLUE SHIELD | Admitting: Physical Therapy

## 2018-09-13 ENCOUNTER — Encounter: Payer: Self-pay | Admitting: Physical Therapy

## 2018-09-13 DIAGNOSIS — R2689 Other abnormalities of gait and mobility: Secondary | ICD-10-CM | POA: Diagnosis not present

## 2018-09-13 DIAGNOSIS — M6281 Muscle weakness (generalized): Secondary | ICD-10-CM

## 2018-09-13 NOTE — Therapy (Signed)
[place] [facility] [address] [place], [place], [address] Phone: [phone]   Fax:  [phone]  Physical Therapy Treatment  Patient Details  Name: Cindy [name] MRN: [id number] Date of Birth: Dec 23, 1965 Referring Provider (PT): Kathi Ludwig, MD/ Larey Dresser, MD   Encounter Date: 09/13/2018  PT End of Session - 09/13/18 1537    Visit Number  3    Number of Visits  8    Date for PT Re-Evaluation  10/12/18    Authorization Type  BCBS    PT Start Time  1534    PT Stop Time  1612    PT Time Calculation (min)  38 min    Activity Tolerance  Patient tolerated treatment well    Behavior During Therapy  [facility] for tasks assessed/performed       Past Medical History:  Diagnosis Date  . Diabetes mellitus   . GERD (gastroesophageal reflux disease)   . Hyperlipemia   . Hypertension     Past Surgical History:  Procedure Laterality Date  . None      There were no vitals filed for this visit.  Subjective Assessment - 09/13/18 1536    Subjective  doing well - no complaints; feels like speech is a little better    Pertinent History  diabetes mellitus, GERD, hyperlipidemia, HTN,    Patient Stated Goals  "Be able to walk better."    Currently in Pain?  No/denies    Pain Score  0-No pain                       [facility] Adult PT Treatment/Exercise - 09/13/18 0001      Ambulation/Gait   Stairs  Yes    Stairs Assistance  5: Supervision    Stairs Assistance Details (indicate cue type and reason)  woring towards progrression of reciprocal pattern - patient reporting fear of falling    Stair Management Technique  Two rails;Alternating pattern   very light UE support at rails   Number of Stairs  12      Exercises   Exercises  Knee/Hip    Other Exercises   Nustep: L4 x 6 min with SPM at or above 50 focusing on cardiovascular endurance      Knee/Hip Exercises: Supine   Bridges   Strengthening;Both;15 reps    Straight Leg Raises  Strengthening;Both;15 reps    Straight Leg Raises Limitations  2#      Knee/Hip Exercises: Sidelying   Hip ABduction  Strengthening;Both;15 reps    Hip ABduction Limitations  2#          Balance Exercises - 09/13/18 1552      Balance Exercises: Standing   Standing Eyes Closed  Narrow base of support (BOS);Foam/compliant surface;3 reps;30 secs   in // bars   Rockerboard  Anterior/posterior;Lateral;EO;20 seconds;5 reps;UE support   light support at // bars   Other Standing Exercises  rockerboard - A/P and lateal progession to static stance + head turns             PT Long Term Goals - 08/28/18 1423      PT LONG TERM GOAL #1   Title  The patient will be independent with home exercise program for high level balance, general strengthening and endurance.    Time  6    Period  Weeks    Target Date  10/12/18      PT LONG TERM GOAL #2  Title  The patient will improve Berg score from 47/56 to > or equal to 52/56 to demo dec'ing risk for falls.    Time  6    Period  Weeks    Target Date  10/12/18      PT LONG TERM GOAL #3   Title  The patient will improve gait speed from 1.52 ft/sec to > or equal to 2.0 ft/sec to demo improving functional mobility.    Time  6    Period  Weeks    Target Date  10/12/18      PT LONG TERM GOAL #4   Title  The patient will ambulate on community surfaces x 500 ft without a device independently.    Time  6    Period  Weeks    Target Date  10/12/18      PT LONG TERM GOAL #5   Title  The patient will negotiate 4 steps without a handrail and reciprocal pattern independently.    Time  6    Period  Weeks    Target Date  10/12/18            Plan - 09/13/18 1537    Clinical Impression Statement  Patient doing well today - able to progress strengthening and balance activities without issue. Patient performing balance activities on rockerboard and compliant surface with need for  intermittent UE support which is a progression from last visit as patient required 1 UE support at all times. Stair training today targeted at progressing towards a reciprocal pattern with light UE support. Patient making good progess towards goals.     Rehab Potential  Good    PT Frequency  2x / week    PT Duration  2 weeks   followed by 1x/week for 4 weeks (for total of 6 weeks plan of care)   PT Treatment/Interventions  ADLs/Self Care Home Management;Balance training;Neuromuscular re-education;Patient/family education;Therapeutic exercise;Therapeutic activities;Functional mobility training;Stair training;Gait training    PT Next Visit Plan  hip strengthening standing, ankle heel/toe raises standing, high level balance.  Job activity training (group home)    [place] and Agree with Plan of Care  Patient;Family member/caregiver    Family Member Consulted  daughter       Patient will benefit from skilled therapeutic intervention in order to improve the following deficits and impairments:  Abnormal gait, Decreased endurance, Decreased activity tolerance, Decreased strength, Pain, Decreased balance, Postural dysfunction  Visit Diagnosis: Other abnormalities of gait and mobility  Muscle weakness (generalized)     Problem List Patient Active Problem List   Diagnosis Date Noted  . Cerebrovascular accident (CVA) ([place]) 08/19/2018  . OBESITY 07/01/2010  . Type 2 diabetes mellitus with complication, with long-term current use of insulin ([place]) 05/08/2008  . HYPERLIPIDEMIA 05/08/2008  . HYPERTENSION 05/08/2008    Lanney Gins, PT, DPT Supplemental Physical Therapist 09/13/18 4:16 PM Pager: [phone] Office: [place] [address] [place], [place], [address] Phone: [phone]   Fax:  [phone]  Name: Cindy [name] MRN: [id number] Date of Birth:  [birth year]

## 2018-09-15 ENCOUNTER — Ambulatory Visit: Payer: BLUE CROSS/BLUE SHIELD | Admitting: Physical Therapy

## 2018-09-19 ENCOUNTER — Encounter: Payer: Self-pay | Admitting: Physical Therapy

## 2018-09-19 ENCOUNTER — Ambulatory Visit: Payer: BLUE CROSS/BLUE SHIELD | Admitting: Physical Therapy

## 2018-09-19 ENCOUNTER — Ambulatory Visit (INDEPENDENT_AMBULATORY_CARE_PROVIDER_SITE_OTHER): Payer: BLUE CROSS/BLUE SHIELD | Admitting: Neurology

## 2018-09-19 DIAGNOSIS — G4733 Obstructive sleep apnea (adult) (pediatric): Secondary | ICD-10-CM

## 2018-09-19 DIAGNOSIS — R51 Headache: Secondary | ICD-10-CM

## 2018-09-19 DIAGNOSIS — R0683 Snoring: Secondary | ICD-10-CM

## 2018-09-19 DIAGNOSIS — M6281 Muscle weakness (generalized): Secondary | ICD-10-CM

## 2018-09-19 DIAGNOSIS — R471 Dysarthria and anarthria: Secondary | ICD-10-CM

## 2018-09-19 DIAGNOSIS — G8194 Hemiplegia, unspecified affecting left nondominant side: Secondary | ICD-10-CM

## 2018-09-19 DIAGNOSIS — Z8673 Personal history of transient ischemic attack (TIA), and cerebral infarction without residual deficits: Secondary | ICD-10-CM

## 2018-09-19 DIAGNOSIS — R400 Somnolence: Secondary | ICD-10-CM

## 2018-09-19 DIAGNOSIS — Z6841 Body Mass Index (BMI) 40.0 and over, adult: Secondary | ICD-10-CM

## 2018-09-19 DIAGNOSIS — R2689 Other abnormalities of gait and mobility: Secondary | ICD-10-CM

## 2018-09-19 DIAGNOSIS — R351 Nocturia: Secondary | ICD-10-CM

## 2018-09-19 DIAGNOSIS — R519 Headache, unspecified: Secondary | ICD-10-CM

## 2018-09-19 NOTE — Therapy (Signed)
[place] [address] [place], [place], [address] Phone: [phone]   Fax:  [phone]  Physical Therapy Treatment  Patient Details  Name: Cindy [name] MRN: [id number] Date of Birth: August 01, 1966 Referring Provider (PT): Kathi Ludwig, MD/ Larey Dresser, MD   Encounter Date: 09/19/2018  PT End of Session - 09/19/18 1538    Visit Number  4    Number of Visits  8    Date for PT Re-Evaluation  10/12/18    Authorization Type  BCBS    PT Start Time  1450    PT Stop Time  1530    PT Time Calculation (min)  40 min    Activity Tolerance  Patient tolerated treatment well    Behavior During Therapy  [name] for tasks assessed/performed       Past Medical History:  Diagnosis Date  . Diabetes mellitus   . GERD (gastroesophageal reflux disease)   . Hyperlipemia   . Hypertension     Past Surgical History:  Procedure Laterality Date  . None      There were no vitals filed for this visit.  Subjective Assessment - 09/19/18 1452    Subjective  doing well - no falls, no pain; having a sleep study tonight    Pertinent History  diabetes mellitus, GERD, hyperlipidemia, HTN,    Patient Stated Goals  "Be able to walk better."    Currently in Pain?  No/denies    Pain Score  0-No pain                       [facility] Adult PT Treatment/Exercise - 09/19/18 0001      Neuro Re-ed    Neuro Re-ed Details   gait/balance on red and blue floor amts (x2 reps each direction): forward, backward, fwd tandem, bwd tandem, eyes closed, horizontal head turns, vertical head turns - min guard throughout; gait aorung ym wiht dual task: tossing ball up/down, CW/CCW gaze fixation, ABC animal name; step up/over varying height steps (4", 6", 8") with L LE lead to promote increased strength with verbal cueing to come close to step for greater ease and reduced fall risk.      Exercises   Other Exercises   L LE - leg press  40# 2 x 15                  PT Long Term Goals - 08/28/18 1423      PT LONG TERM GOAL #1   Title  The patient will be independent with home exercise program for high level balance, general strengthening and endurance.    Time  6    Period  Weeks    Target Date  10/12/18      PT LONG TERM GOAL #2   Title  The patient will improve Berg score from 47/56 to > or equal to 52/56 to demo dec'ing risk for falls.    Time  6    Period  Weeks    Target Date  10/12/18      PT LONG TERM GOAL #3   Title  The patient will improve gait speed from 1.52 ft/sec to > or equal to 2.0 ft/sec to demo improving functional mobility.    Time  6    Period  Weeks    Target Date  10/12/18      PT LONG TERM GOAL #4   Title  The patient will ambulate on  community surfaces x 500 ft without a device independently.    Time  6    Period  Weeks    Target Date  10/12/18      PT LONG TERM GOAL #5   Title  The patient will negotiate 4 steps without a handrail and reciprocal pattern independently.    Time  6    Period  Weeks    Target Date  10/12/18            Plan - 09/19/18 1539    Clinical Impression Statement  Session today focusing on functional strength and balance with gait and mobility. Difficulty with ambulation on compliant surfaces as well as with dual task and gaze fixation. Requires multiple cues for tracking and remaining on tasks. Patient with subjective reports of improved endurance with current walking program. Making good progress towards goals. Will plan to update HEP at next visit.     Rehab Potential  Good    PT Frequency  2x / week    PT Duration  2 weeks   followed by 1x/week for 4 weeks (for total of 6 weeks plan of care)   PT Treatment/Interventions  ADLs/Self Care Home Management;Balance training;Neuromuscular re-education;Patient/family education;Therapeutic exercise;Therapeutic activities;Functional mobility training;Stair training;Gait training    PT Next Visit Plan   hip strengthening standing, ankle heel/toe raises standing, high level balance.  Job activity training (group home)    [place] and Agree with Plan of Care  Patient;Family member/caregiver    Family Member Consulted  daughter       Patient will benefit from skilled therapeutic intervention in order to improve the following deficits and impairments:  Abnormal gait, Decreased endurance, Decreased activity tolerance, Decreased strength, Pain, Decreased balance, Postural dysfunction  Visit Diagnosis: Other abnormalities of gait and mobility  Muscle weakness (generalized)     Problem List Patient Active Problem List   Diagnosis Date Noted  . Cerebrovascular accident (CVA) ([place]) 08/19/2018  . OBESITY 07/01/2010  . Type 2 diabetes mellitus with complication, with long-term current use of insulin ([place]) 05/08/2008  . HYPERLIPIDEMIA 05/08/2008  . HYPERTENSION 05/08/2008     Lanney Gins, PT, DPT Supplemental Physical Therapist 09/19/18 4:32 PM Pager: [phone] Office: [place] [address] [place], [place], [address] Phone: [phone]   Fax:  [phone]  Name: Cindy [name] MRN: [id number] Date of Birth: 09/10/1966

## 2018-09-22 ENCOUNTER — Ambulatory Visit: Payer: BLUE CROSS/BLUE SHIELD | Admitting: Rehabilitative and Restorative Service Providers"

## 2018-09-25 NOTE — Progress Notes (Signed)
[facility] [address] [place]. [place] [address] [phone]       OFFICE FOLLOW UP NOTE  [name] Date of Birth:  [birth year] Medical Record Number:  [id number]   Reason for Referral:  hospital stroke follow up  CHIEF COMPLAINT:  Chief Complaint  Patient presents with  . Follow-up    Stroke follow up from hospital room 9 pt with ANGELICA her daughter    HPI: Cindy [name] is being seen today for initial visit in the office for right BG/CR/external capsule infarcts most likely due to small/large vessel disease given multiple uncontrolled risk factors on 08/18/2018. History obtained from patient, daughter and chart review. Reviewed all radiology images and labs personally.  Cindy [name] is a 52 y.o. female with history of hypertension, hyperlipidemia, and diabetes mellitus who presented with a headache, left facial weakness and slurred speech. She did not receive IV t-PA.  CT had reviewed and was negative for acute infarct or abnormality.  MRI brain reviewed and showed small acute/early subacute infarct involving the right pontine, posterior limb of internal capsule and caudate body.  CTA head and neck was negative for LVO but did show multiple moderate to severe intracranial first and second order arterial stenosis with bilateral PCA P1/P2 junctions moderate to severe stenosis and distal left MCA M1 severe stenosis.  It was not felt as though IR consultation was needed at that time as she was asymptomatic and recommended continue medical management unless symptomatic recurrent stroke and will consider IR at that time.  2D echo showed an EF of 55 to 60% without cardiac source of embolus identified.  Etiology of stroke likely due to small/large vessel disease given multiple uncontrolled risk factors.  Patient was on aspirin 325 mg daily PTA and recommended DAPT for 3 months and Plavix alone.  LDL 126 and recommended initiating Lipitor 80 mg daily.   A1c A1c 11.7 and recommended tight glycemic control with close PCP follow-up for DM management.  Recommended outpatient sleep study for possible OSA.  Patient was discharged home in stable condition with recommended outpatient PT/OT.  Patient is being seen today for hospital follow-up.  She did have an appointment with [facility] provider Dr. Rexene Alberts and recommended her going split-night study to rule out OSA which patient states was performed on 09/19/2018.  She does continue to have dysarthria without noticeable improvement but has not had any type of speech therapy since discharge.  She does feel as though her speech worsens with fatigue.  She does have intermittent balance difficulties with mild left hemiparesis with mild improvement with participation in PT.  She has continued to take both aspirin and Plavix without side effects of bleeding or bruising.  Continues to take Lipitor but does endorse new onset cramping in her legs and back.  Blood pressure today satisfactory at 137/98.  Does monitor glucose levels at home and typically range from 1 16-2 10.  This is continued to be managed by her PCP.  She does endorse increased depression and anxiety since her stroke and becomes tearful during appointment as she is frustrated that she has been unable to return to work at this time and that she has not made much improvement.  She denies any depression in the past.  Denies any suicidal ideation or plan.  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  The second job is also at a group home but this is where she is more active and assist with bathing, cleaning, laundry and meal cooking which she also works Monday through Friday 3pm-11pm.  Her daughter does work at both of these facilities on the same shifts.  Per daughter, patient was prescribed  clonazepam by her PCP but she has not started this medication at this time.  No further concerns at this time.  Denies new or worsening stroke/TIA symptoms.   ROS:   14 system review of systems performed and negative with exception of activity change, fatigue, double vision, blurred vision, choking, leg swelling, black stools, restless leg, apnea, daytime sleepiness, snoring, joint pain, back pain, aching muscles, muscle cramps, walking difficulty, neck stiffness, moles, speech difficulty, facial drooping, depression, and nervous/  PMH:  Past Medical History:  Diagnosis Date  . Diabetes mellitus   . GERD (gastroesophageal reflux disease)   . Hyperlipemia   . Hypertension   . Stroke [facility])     PSH:  Past Surgical History:  Procedure Laterality Date  . None      Social History:  Social History   Socioeconomic History  . Marital status: Single    Spouse name: Not on file  . Number of children: 2  . Years of education: Not on file  . Highest education level: Not on file  Occupational History  . Occupation: [employer]  . Financial resource strain: Not on file  . Food insecurity:    Worry: Not on file    Inability: Not on file  . Transportation needs:    Medical: Not on file    Non-medical: Not on file  Tobacco Use  . Smoking status: Never Smoker  . Smokeless tobacco: Never Used  Substance and Sexual Activity  . Alcohol use: No  . Drug use: No  . Sexual activity: Never  Lifestyle  . Physical activity:    Days per week: Not on file    Minutes per session: Not on file  . Stress: Not on file  Relationships  . Social connections:    Talks on phone: Not on file    Gets together: Not on file    Attends religious service: Not on file    Active member of club or organization: Not on file    Attends meetings of clubs or organizations: Not on file    Relationship status: Not on file  . Intimate partner violence:    Fear of current or ex partner:  Not on file    Emotionally abused: Not on file    Physically abused: Not on file    Forced sexual activity: Not on file  Other Topics Concern  . Not on file  Social History Narrative  . Not on file    Family History:  Family History  Problem Relation Age of Onset  . Diabetes Mother   . Hypertension Mother   . Breast cancer Mother   . Stroke Mother   . Hypertension Father   . Heart failure Father   . Breast cancer Maternal Grandmother   . Stroke Brother   . Colon cancer Neg Hx   . Stomach cancer Neg Hx   . Rectal cancer Neg Hx   . Esophageal cancer Neg Hx   . Liver cancer Neg Hx     Medications:   Current Outpatient Medications on File Prior to Visit  Medication Sig Dispense Refill  . aspirin 81 MG tablet Take 4 tablets (325 mg total) by mouth daily. 90 tablet 0  . atorvastatin (LIPITOR) 80 MG tablet Take 1 tablet (80 mg total) by mouth daily at 6 PM. 90 tablet 1  . clopidogrel (PLAVIX) 75 MG tablet Take 1 tablet (75 mg total) by mouth daily. 90 tablet 1  . Insulin Glargine (LANTUS ) Inject 30 Units into the skin at bedtime.    . Insulin Syringes, Disposable, U-100 0.5 ML MISC 30 Units by Does not apply route 2 (two) times daily with a meal. 100 each 3  . metFORMIN (GLUCOPHAGE) 1000 MG tablet Take 1,000 mg by mouth 2 (two) times daily with a meal.     No current facility-administered medications on file prior to visit.     Allergies:   Allergies  Allergen Reactions  . Other     Hives to an unknown medication given in 2012 at [facility]:   09/26/18 1000  BP: (!) 137/98  Pulse: 95  Weight: 244 lb (110.7 kg)  Height: 5\' 3"  (1.6 m)   Body mass index is 43.22 kg/m. No exam data present  General: Obese pleasant middle-aged African-American female,, seated, in no evident distress Head: head normocephalic and atraumatic.   Neck: supple with no carotid or supraclavicular bruits Cardiovascular: regular rate and rhythm, no  murmurs Musculoskeletal: no deformity Skin:  no rash/petichiae Vascular:  Normal pulses all extremities  Neurologic Exam Mental Status: Awake and fully alert.  Mild dysarthria.  Oriented to place and time. Recent and remote memory intact. Attention span, concentration and fund of knowledge appropriate. Mood and affect appropriate.  Cranial Nerves: Fundoscopic exam reveals sharp disc margins. Pupils equal, briskly reactive to light. Extraocular movements full without nystagmus. Visual fields full to confrontation. Hearing intact. Facial sensation intact.  Mild left facial weakness Motor: Normal bulk and tone.  LUE: 5/5 except for weak grip strength; LLE: 4+/5 in hip flexor; full strength in right upper and lower extremity Sensory.: intact to touch , pinprick , position and vibratory sensation.  Coordination: Rapid alternating movements normal in all extremities. Finger-to-nose and heel-to-shin performed accurately bilaterally.  Orbits right arm over left arm.  Decreased left hand finger dexterity Gait and Station: Arises from chair without difficulty. Stance is normal. Gait demonstrates normal stride length and balance . Able to heel, toe and tandem walk with mild difficulty.  Romberg negative. Reflexes: 1+ and symmetric. Toes downgoing.    NIHSS  2 Modified Rankin  2    Diagnostic Data (Labs, Imaging, Testing)  Ct Head Wo Contrast 08/18/2018 IMPRESSION:  Negative non contrasted CT appearance of the brain.   Mr Brain 56 Contrast Mr Mrv Head Wo Cm 08/19/2018 IMPRESSION:  1. Small acute/early subacute infarct involving the right putamen, posterior limb of internal capsule, and caudate body. No associated hemorrhage or mass effect.  2. No evidence of dural venous sinus thrombosis.  3. Mild chronic microvascular ischemic changes of the brain.   CTA Head and Neck 08/19/2018 IMPRESSION: 1. Negative for large vessel occlusion, but positive for multiple moderate to severe intracranial  1st and 2nd order arterial stenoses: - bilateral PCA P1/P2 junctions (moderate to severe). - distal Left MCA M1 (severe). - Right MCA posterior M2 origin (moderate). 2. 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 3. No significant arterial stenosis in the neck. Highly tortuous cervical ICAs. 4.  Subtle evidence of the acute small vessel ischemia in the posterior right deep gray matter on CT. No new No acute intracranial abnormality.    Transthoracic Echocardiogram 08/19/2018 Study Conclusions - Left ventricle: The cavity size was normal. Wall thickness was increased in a pattern of mild LVH. Systolic function was normal. The estimated ejection fraction was in the range of 55% to 60%. Wall motion was normal; there were no regional wall motion abnormalities. Impressions: - No cardiac source of emboli was indentified.    ASSESSMENT: Cindy [name] is a 52 y.o. 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 Vascular risk factors include intracranial stenosis, HTN, HLD, DM and obesity.  Patient returns today for hospital follow-up and does continue to have left hemiparesis, balance impairment and dysarthria.    PLAN: -Continue aspirin 81 mg daily and clopidogrel 75 mg daily secondary stroke prevention -Stop atorvastatin at this time due to possible myalgias and start Crestor 40 mg daily -Order placed for Zoloft 50 mg daily for post stroke depression and anxiety -F/u with PCP regarding your HLD, HTN and DM management -Referral placed for ST and OT for continued deficits -Advised to continue PT -f/u with Dr. Rexene Alberts for OSA results regarding recent sleep study -At this time as patient has minimal deficits, she was released to return back to work at only 1 of her part-time positions where she works Monday through  Friday 9 AM to 2 PM.  Advised patient that she should wait to return to driving at this time until she can be evaluated by OT but asked her daughter does work at the same facility, her daughter is able to override her with transportation.  Patient feels comfortable with returning to work at this time.  Returning to this job will most likely provide patient benefit regarding continued dysarthria, mild left-sided weakness and depression -continue to monitor BP at home -advised to continue to stay active and maintain a healthy diet -Maintain strict control of hypertension with blood pressure goal below 130/90, diabetes with hemoglobin A1c goal below 6.5% and cholesterol with LDL cholesterol (bad cholesterol) goal below 70 mg/dL. I also advised the patient to eat a healthy diet with plenty of whole grains, cereals, fruits and vegetables, exercise regularly and maintain ideal body weight.  Follow up in 3 months or call earlier if needed   Greater than 50% of time during this 25 minute visit was spent on counseling,explanation of diagnosis of right BG/CR infarct, reviewing risk factor management of intracranial stenosis, HTN, HLD, DM and obesity, planning of further management, discussion with patient and family and coordination of care    Venancio Poisson, AGNP-BC  [facility] [address] [place], [place] [address]  Phone [phone] Fax [phone] Note: This document was prepared with digital dictation and possible smart phrase technology. Any transcriptional errors that result from this process are unintentional.

## 2018-09-26 ENCOUNTER — Encounter: Payer: Self-pay | Admitting: Adult Health

## 2018-09-26 ENCOUNTER — Ambulatory Visit: Payer: BLUE CROSS/BLUE SHIELD | Admitting: Adult Health

## 2018-09-26 VITALS — BP 137/98 | HR 95 | Ht 63.0 in | Wt 244.0 lb

## 2018-09-26 DIAGNOSIS — I10 Essential (primary) hypertension: Secondary | ICD-10-CM | POA: Diagnosis not present

## 2018-09-26 DIAGNOSIS — I63511 Cerebral infarction due to unspecified occlusion or stenosis of right middle cerebral artery: Secondary | ICD-10-CM

## 2018-09-26 DIAGNOSIS — Z794 Long term (current) use of insulin: Secondary | ICD-10-CM

## 2018-09-26 DIAGNOSIS — E785 Hyperlipidemia, unspecified: Secondary | ICD-10-CM

## 2018-09-26 DIAGNOSIS — E118 Type 2 diabetes mellitus with unspecified complications: Secondary | ICD-10-CM

## 2018-09-26 MED ORDER — SERTRALINE HCL 50 MG PO TABS: 50.0000 mg | ORAL_TABLET | Freq: Every day | ORAL | 3 refills | Status: DC

## 2018-09-26 MED ORDER — ROSUVASTATIN CALCIUM 40 MG PO TABS: 40.0000 mg | ORAL_TABLET | Freq: Every day | ORAL | 3 refills | Status: DC

## 2018-09-26 NOTE — Patient Instructions (Signed)
Continue aspirin 81 mg daily and clopidogrel 75 mg daily  and stop lipitor due to muscle aches and pain and start Crestor 40mg   for secondary stroke prevention  Stop aspirin and continue plavix only after 11/17/18  Start zoloft 50mg  at night for depression - if after 1 month you continue to have depression/anxiety, please let me know and we can increase it   Order placed for speech therapy and occupational therapy - you will be called to schedule   Continue to follow up with PCP regarding cholesterol, blood pressure and diabetes management   Continue physical therapy for continued deficits   Continue to monitor blood pressure at home  Maintain strict control of hypertension with blood pressure goal below 130/90, diabetes with hemoglobin A1c goal below 6.5% and cholesterol with LDL cholesterol (bad cholesterol) goal below 70 mg/dL. I also advised the patient to eat a healthy diet with plenty of whole grains, cereals, fruits and vegetables, exercise regularly and maintain ideal body weight.  Followup in the future with me in 3 months or call earlier if needed       Thank you for coming to see [place] at [facility]. I hope we have been able to provide you high quality care today.  You may receive a patient satisfaction survey over the next few weeks. We would appreciate your feedback and comments so that we may continue to improve ourselves and the health of our patients.

## 2018-09-29 ENCOUNTER — Encounter: Payer: Self-pay | Admitting: Rehabilitative and Restorative Service Providers"

## 2018-09-29 ENCOUNTER — Ambulatory Visit
Payer: BLUE CROSS/BLUE SHIELD | Attending: Family Medicine | Admitting: Rehabilitative and Restorative Service Providers"

## 2018-09-29 DIAGNOSIS — R2689 Other abnormalities of gait and mobility: Secondary | ICD-10-CM | POA: Diagnosis present

## 2018-09-29 DIAGNOSIS — M6281 Muscle weakness (generalized): Secondary | ICD-10-CM

## 2018-09-29 NOTE — Progress Notes (Signed)
Patient referred by Dr. Erlinda Hong in the hospital, seen by me on 08/25/18, split night sleep study on 09/19/18. Please call and notify patient that the recent sleep study confirmed the diagnosis of severe OSA. She did very well with CPAP during the study with significant improvement of the respiratory events. Therefore, I would like start the patient on CPAP therapy at home by prescribing a machine for home use. I placed the order in the chart.  Please advise patient that we need a follow up appointment with either myself or one of our nurse practitioners in about 10 weeks post set-up to check for how the patient is feeling and how well the patient is using the machine, etc. Please go ahead and schedule the appointment, while you have the patient on the phone and make sure patient understands the importance of keeping this window for the FU appointment, as it is often an insurance requirement. Failing to adhere to this may result in losing coverage for sleep apnea treatment, at which point most patients are left with a choice of returning the machine or paying out of pocket (and we want neither of this to happen!).  Please re-enforce the importance of compliance with treatment and the need for [place] to monitor compliance data - again an insurance requirement and usually a good feedback for the patient as far as how they are doing.  Also remind patient, that any PAP machine or mask issues should be first addressed with the DME company, who provided the machine/mask.  Please ask if patient has a preference regarding DME company, may depend on the insurance too.  Please arrange for CPAP set up at home through a DME company of patient's choice.  Once you have spoken to the patient you can close the phone encounter. Please fax/route report to referring provider, thanks,   Star Age, MD, PhD [facility])

## 2018-09-29 NOTE — Addendum Note (Signed)
Addended by: Star Age on: 09/29/2018 12:34 PM   Modules accepted: Orders

## 2018-09-29 NOTE — Therapy (Signed)
[facility] [address] [place], [place], [address] Phone: [phone]   Fax:  [phone]  Physical Therapy Treatment and Discharge Summary  Patient Details  Name: Cindy [name] MRN: [id number] Date of Birth: 1966/01/24 Referring Provider (PT): Kathi Ludwig, MD/ Larey Dresser, MD   Encounter Date: 09/29/2018  PT End of Session - 09/29/18 1507    Visit Number  5    Number of Visits  8    Date for PT Re-Evaluation  10/12/18    Authorization Type  BCBS    PT Start Time  1502    PT Stop Time  1530    PT Time Calculation (min)  28 min    Activity Tolerance  Patient tolerated treatment well    Behavior During Therapy  [facility] for tasks assessed/performed       Past Medical History:  Diagnosis Date  . Diabetes mellitus   . GERD (gastroesophageal reflux disease)   . Hyperlipemia   . Hypertension   . Stroke [facility])     Past Surgical History:  Procedure Laterality Date  . None      There were no vitals filed for this visit.  Subjective Assessment - 09/29/18 1506    Subjective  The patient went back to work 9am-2pm shift this week and reports that it is going well-her daughter drives.   She notes she is having a hard time getting up from a chair.  She feels her balance is okay with no falls.   The patient feels 85-90% back to her baseline.  She feels reaction time may be slowed.  No further nerve pain.    Pertinent History  diabetes mellitus, GERD, hyperlipidemia, HTN,    Patient Stated Goals  "Be able to walk better."    Currently in Pain?  No/denies         [facility] PT Assessment - 09/29/18 1510      Standardized Balance Assessment   Standardized Balance Assessment  Berg Balance Test      Berg Balance Test   Sit to Stand  Able to stand without using hands and stabilize independently    Standing Unsupported  Able to stand safely 2 minutes    Sitting with Back Unsupported but Feet Supported on Floor  or Stool  Able to sit safely and securely 2 minutes    Stand to Sit  Sits safely with minimal use of hands    Transfers  Able to transfer safely, minor use of hands    Standing Unsupported with Eyes Closed  Able to stand 10 seconds safely    Standing Ubsupported with Feet Together  Able to place feet together independently and stand 1 minute safely    From Standing, Reach Forward with Outstretched Arm  Can reach confidently >25 cm (10")    From Standing Position, Pick up Object from Floor  Able to pick up shoe safely and easily    From Standing Position, Turn to Look Behind Over each Shoulder  Looks behind from both sides and weight shifts well    Turn 360 Degrees  Able to turn 360 degrees safely in 4 seconds or less    Standing Unsupported, Alternately Place Feet on Step/Stool  Able to stand independently and safely and complete 8 steps in 20 seconds    Standing Unsupported, One Foot in Front  Able to place foot tandem independently and hold 30 seconds    Standing on One Leg  Able to lift leg  independently and hold 5-10 seconds    Total Score  55    Berg comment:  55/56                   [facility] Adult PT Treatment/Exercise - 09/29/18 1546      Ambulation/Gait   Ambulation/Gait  Yes    Ambulation/Gait Assistance  6: Modified independent (Device/Increase time)   slowed pace   Ambulation Distance (Feet)  600 Feet    Assistive device  None    Gait Pattern  Decreased arm swing - right;Decreased arm swing - left;Step-through pattern    Ambulation Surface  Level;Unlevel;Indoor;Outdoor    Gait velocity  3.05 ft/sec    Stairs  Yes    Stairs Assistance  6: Modified independent (Device/Increase time)    Stairs Assistance Details (indicate cue type and reason)  with one handrail and without handrail (without handrail recommend step to pattern)    Gait Comments  Community surfaces, curbs, etc without device mod indep (slowed pace)      Self-Care   Self-Care  Other Self-Care Comments     Other Self-Care Comments   Discussed return to first job.  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  The patient feels she is able to return to prior duties noting fatigue may be the only concern.  She plans to return at lower # of hours to work up to full schedule.        Neuro Re-ed    Neuro Re-ed Details   Berg balance score 55/56             PT Education - 09/29/18 1545    Education Details  discussed job activities and patient feels she can return to prior jobs with some modification to schedule to ensure she is not too fatigued    Person(s) Educated  Patient    Methods  Explanation    Comprehension  Verbalized understanding          PT Long Term Goals - 09/29/18 1512      PT LONG TERM GOAL #1   Title  The patient will be independent with home exercise program for high level balance, general strengthening and endurance.    Time  6    Period  Weeks    Status  Achieved      PT LONG TERM GOAL #2   Title  The patient will improve Berg score from 47/56 to > or equal to 52/56 to demo dec'ing risk for falls.    Baseline  55/56    Time  6    Period  Weeks    Status  Achieved      PT LONG TERM GOAL #3   Title  The patient will improve gait speed from 1.52 ft/sec to > or equal to 2.0 ft/sec to demo improving functional mobility.    Baseline  3.05 ft/sec on 09/29/2018    Time  6    Period  Weeks    Status  Achieved      PT LONG TERM GOAL #4   Title  The patient will ambulate on community surfaces x 500 ft without a device independently.    Time  6    Period  Weeks    Status  Achieved      PT LONG TERM GOAL #5   Title  The patient will negotiate 4 steps without a handrail and reciprocal pattern independently.    Baseline  without handrail with step to pattern mod indep and with handrail with reciprocal pattern    Time  6    Period  Weeks    Status  Partially Met            Plan - 09/29/18 1545    Clinical  Impression Statement  The patient met all LTGs.  PT discharging today with HEP.    PT Next Visit Plan  Discharge today    Consulted and Agree with Plan of Care  Patient       Patient will benefit from skilled therapeutic intervention in order to improve the following deficits and impairments:     Visit Diagnosis: Other abnormalities of gait and mobility  Muscle weakness (generalized)    PHYSICAL THERAPY DISCHARGE SUMMARY  Visits from Start of Care: 5  Current functional level related to goals / functional outcomes: See goals   Remaining deficits: Endurance- patient working on home walking and return to work.   Education / Equipment: Home program.  Plan: Patient agrees to discharge.  Patient goals were met. Patient is being discharged due to meeting the stated rehab goals.  ?????        Thank you for the referral of this patient. Rudell Cobb, MPT   [place] , PT 09/29/2018, 3:51 PM  [facility] [address] [place], [place], [address] Phone: [phone]   Fax:  [phone]  Name: Cindy [name] MRN: [id number] Date of Birth: 1966/09/15

## 2018-09-29 NOTE — Procedures (Signed)
PATIENT'S NAME:  Cindy [name], Cindy [name] DOB:      October 12, 1966      MR#:    [id number]     DATE OF RECORDING: 09/19/2018 REFERRING M.D.:  Rosalin Hawking, MD Study Performed:  Split-Night Titration Study HISTORY: 52 year old woman with a history of hypertension, hyperlipidemia, diabetes, stroke in September 2019, and morbid obesity, who reports snoring and excessive daytime somnolence and witnessed apneas while in the hospital. Her Epworth sleepiness score is 19 out of 24. The patient's weight 247 pounds with a height of 63 (inches), resulting in a BMI of 43.8 kg/m2. The patient's neck circumference measured 16.8 inches. CURRENT MEDICATIONS: Lipitor, Plavix, Glucophage, Novolog, Prinzide.  PROCEDURE:  This is a multichannel digital polysomnogram utilizing the Somnostar 11.2 system.  Electrodes and sensors were applied and monitored per AASM Specifications.   EEG, EOG, Chin and Limb EMG, were sampled at 200 Hz.  ECG, Snore and Nasal Pressure, Thermal Airflow, Respiratory Effort, CPAP Flow and Pressure, Oximetry was sampled at 50 Hz. Digital video and audio were recorded.      BASELINE STUDY WITHOUT CPAP RESULTS:  Lights Out was at 22:51 and Lights On at 06:00 for the night, split start at 01:12, epoch 287. Total recording time (TRT) was 140, with a total sleep time (TST) of 128.5 minutes.   The patient's sleep latency was 1.5 minutes.  REM latency was 105 minutes.  The sleep efficiency was 91.8 %.    SLEEP ARCHITECTURE: WASO (Wake after sleep onset) was 6.5 minutes, Stage N1 was 3.5 minutes, Stage N2 was 80 minutes, Stage N3 was 21.5 minutes and Stage R (REM sleep) was 23.5 minutes.  The percentages were Stage N1 2.7%, Stage N2 62.3%, Stage N3 16.7% and Stage R (REM sleep) 18.3%.  The arousals were noted as: 64 were spontaneous, 0 were associated with PLMs, 47 were associated with respiratory events.  RESPIRATORY ANALYSIS:  There were a total of 124 respiratory events:  43 obstructive apneas, 1 central apneas  and 1 mixed apneas with a total of 45 apneas and an apnea index (AI) of 21.. There were 79 hypopneas with a hypopnea index of 36.9. The patient also had 0 respiratory event related arousals (RERAs).  Snoring was noted.     The total APNEA/HYPOPNEA INDEX (AHI) was 57.9 /hour and the total RESPIRATORY DISTURBANCE INDEX was 57.9 /hour.  24 events occurred in REM sleep and 159 events in NREM. The REM AHI was 61.3, /hour versus a non-REM AHI of 57.1 /hour. The patient spent 334 minutes sleep time in the supine position 73 minutes in non-supine. The supine AHI was 54.7 /hour versus a non-supine AHI of 60.4 /hour.  OXYGEN SATURATION & C02:  The wake baseline 02 saturation was 95%, with the lowest being 81%. Time spent below 89% saturation equaled 39 minutes.  PERIODIC LIMB MOVEMENTS: The patient had a total of 0 Periodic Limb Movements.  The Periodic Limb Movement (PLM) index was 0 /hour and the PLM Arousal index was 0 /hour.  Audio and video analysis did not show any abnormal or unusual movements, behaviors, phonations or vocalizations. The patient took no bathroom breaks. Moderate snoring was noted. The EKG was in keeping with normal sinus rhythm (NSR).  TITRATION STUDY WITH CPAP RESULTS:   The patient was fitted with a small Simplus FFM. CPAP was initiated at 5 cmH20 with heated humidity per AASM split night standards and pressure was advanced to 12 cmH20 because of hypopneas, apneas and desaturations.  At a PAP pressure  of 12 cmH20, there was a reduction of the AHI to 0 /hour with supine REM sleep achieved and O2 nadir of 94%.   Total recording time (TRT) was 290 minutes, with a total sleep time (TST) of 278 minutes. The patient's sleep latency was 10.5 minutes. REM latency was 81.5 minutes.  The sleep efficiency was 95.9 %.    SLEEP ARCHITECTURE: Wake after sleep was 9.5 minutes, Stage N1 3.5 minutes, Stage N2 166.5 minutes, Stage N3 28 minutes and Stage R (REM sleep) 80 minutes. The percentages were:  Stage N1 1.3%, Stage N2 59.9%, Stage N3 10.1% and Stage R (REM sleep) 28.8%. The arousals were noted as: 13 were spontaneous, 1 were associated with PLMs, 13 were associated with respiratory events.  RESPIRATORY ANALYSIS:  There were a total of 67 respiratory events: 5 obstructive apneas, 2 central apneas and 0 mixed apneas with a total of 7 apneas and an apnea index (AI) of 1.5. There were 60 hypopneas with a hypopnea index of 12.9 /hour. The patient also had 0 respiratory event related arousals (RERAs).      The total APNEA/HYPOPNEA INDEX  (AHI) was 14.5 /hour and the total RESPIRATORY DISTURBANCE INDEX was 14.5 /hour.  6 events occurred in REM sleep and 61 events in NREM. The REM AHI was 4.5 /hour versus a non-REM AHI of 18.5 /hour. REM sleep was achieved on a pressure of  cm/h2o (AHI was  .) The patient spent 100% of total sleep time in the supine position. The supine AHI was 14.4 /hour, versus a non-supine AHI of 0.0/hour.  OXYGEN SATURATION & C02:  The wake baseline 02 saturation was 96%, with the lowest being 89%. Time spent below 89% saturation equaled 0 minutes.  PERIODIC LIMB MOVEMENTS: The patient had a total of 29 Periodic Limb Movements. The Periodic Limb Movement (PLM) index was 6.3 /hour and the PLM Arousal index was .2 /hour.  Post-study, the patient indicated that sleep was better than usual.  POLYSOMNOGRAPHY IMPRESSION :   1. Obstructive Sleep Apnea (OSA)    RECOMMENDATIONS:  1. This patient has severe obstructive sleep apnea and responded well on CPAP therapy. I will recommend that the patient start on home CPAP treatment at a pressure of 12 cm via small full face mask with heated humidity. The patient should be reminded to be fully compliant with PAP therapy to improve sleep related symptoms and decrease long term cardiovascular risks. Please note that untreated obstructive sleep apnea may carry additional perioperative morbidity. Patients with significant obstructive sleep  apnea should receive perioperative PAP therapy and the surgeons and particularly the anesthesiologist should be informed of the diagnosis and the severity of the sleep disordered breathing. 2. The patient should be cautioned not to drive, work at heights, or operate dangerous or heavy equipment when tired or sleepy. Review and reiteration of good sleep hygiene measures should be pursued with any patient. 3. The patient will be seen in follow-up in the sleep clinic at [facility] for discussion of the test results, symptom and treatment compliance review, further management strategies, etc. The referring provider will be notified of the test results.  I certify that I have reviewed the entire raw data recording prior to the issuance of this report in accordance with the Standards of Accreditation of the American Academy of Sleep Medicine (AASM)  Star Age, MD, PhD Diplomat, American Board of Neurology and Sleep Medicine (Neurology and Sleep Medicine)

## 2018-10-01 NOTE — Progress Notes (Signed)
I agree with the above plan 

## 2018-10-02 ENCOUNTER — Telehealth: Payer: Self-pay

## 2018-10-02 NOTE — Telephone Encounter (Signed)
-----   Message from Star Age, MD sent at 09/29/2018 12:34 PM EDT ----- Patient referred by Dr. Erlinda Hong in the hospital, seen by me on 08/25/18, split night sleep study on 09/19/18. Please call and notify patient that the recent sleep study confirmed the diagnosis of severe OSA. She did very well with CPAP during the study with significant improvement of the respiratory events. Therefore, I would like start the patient on CPAP therapy at home by prescribing a machine for home use. I placed the order in the chart.  Please advise patient that we need a follow up appointment with either myself or one of our nurse practitioners in about 10 weeks post set-up to check for how the patient is feeling and how well the patient is using the machine, etc. Please go ahead and schedule the appointment, while you have the patient on the phone and make sure patient understands the importance of keeping this window for the FU appointment, as it is often an insurance requirement. Failing to adhere to this may result in losing coverage for sleep apnea treatment, at which point most patients are left with a choice of returning the machine or paying out of pocket (and we want neither of this to happen!).  Please re-enforce the importance of compliance with treatment and the need for [place] to monitor compliance data - again an insurance requirement and usually a good feedback for the patient as far as how they are doing.  Also remind patient, that any PAP machine or mask issues should be first addressed with the DME company, who provided the machine/mask.  Please ask if patient has a preference regarding DME company, may depend on the insurance too.  Please arrange for CPAP set up at home through a DME company of patient's choice.  Once you have spoken to the patient you can close the phone encounter. Please fax/route report to referring provider, thanks,   Star Age, MD, PhD [facility])

## 2018-10-02 NOTE — Telephone Encounter (Signed)
I called pt to discuss her sleep study results. No answer, left a message asking her to call me back. 

## 2018-10-03 NOTE — Telephone Encounter (Signed)
I called pt to discuss her sleep study results. No answer, left a message asking her to call me back. 

## 2018-10-04 NOTE — Telephone Encounter (Signed)
I called pt. I advised pt that Dr. Rexene Alberts reviewed their sleep study results and found that pt has severe osa but did very well with the cpap during her latest sleep study. Dr. Rexene Alberts recommends that pt start a cpap at home. I reviewed PAP compliance expectations with the pt. Pt is agreeable to starting a CPAP. I advised pt that an order will be sent to a DME, [facility], and [facility] will call the pt within about one week after they file with the pt's insurance. [facility] will show the pt how to use the machine, fit for masks, and troubleshoot the CPAP if needed. A follow up appt was made for insurance purposes with Dr. Rexene Alberts on 01/18/19 at 11:30am. Pt verbalized understanding to arrive 15 minutes early and bring their CPAP. A letter with all of this information in it will be mailed to the pt as a reminder. I verified with the pt that the address we have on file is correct. Pt verbalized understanding of results. Pt had no questions at this time but was encouraged to call back if questions arise. I have sent the order to [facility] and have received confirmation that they have received the order.

## 2018-11-22 ENCOUNTER — Encounter: Payer: Self-pay | Admitting: Adult Health

## 2018-11-23 ENCOUNTER — Telehealth: Payer: Self-pay

## 2018-11-23 DIAGNOSIS — G4733 Obstructive sleep apnea (adult) (pediatric): Secondary | ICD-10-CM

## 2018-11-23 DIAGNOSIS — Z9989 Dependence on other enabling machines and devices: Principal | ICD-10-CM

## 2018-11-23 NOTE — Telephone Encounter (Signed)
Received a request from [facility] regarding pt's AHI of 15.9. They are wondering if a pressure increase can be ordered to decrease AHI further.  Dr. Rexene Alberts is out of the office. Will send to Dr. Brett Fairy for review.

## 2018-11-23 NOTE — Telephone Encounter (Signed)
Reg residual AHI of 15.9/h on 12 cm CPAP.   Yes, I think a pressure increase can help to reduce the AHI, please increase from 12 to 14 cm water , reduce the EPR to 2 cm water , from currently 3.

## 2018-11-23 NOTE — Telephone Encounter (Signed)
I tried calling both numbers listed on pt's DPR. Both numbers have been disconnected. I will send pt a mychart message discussing the pressure change.  Order has been sent to [facility] via community message.

## 2018-12-27 ENCOUNTER — Ambulatory Visit: Payer: BLUE CROSS/BLUE SHIELD | Admitting: Adult Health

## 2018-12-27 ENCOUNTER — Encounter: Payer: Self-pay | Admitting: Adult Health

## 2018-12-27 VITALS — BP 125/81 | HR 80 | Wt 240.0 lb

## 2018-12-27 DIAGNOSIS — Z9989 Dependence on other enabling machines and devices: Secondary | ICD-10-CM

## 2018-12-27 DIAGNOSIS — I63511 Cerebral infarction due to unspecified occlusion or stenosis of right middle cerebral artery: Secondary | ICD-10-CM

## 2018-12-27 DIAGNOSIS — I10 Essential (primary) hypertension: Secondary | ICD-10-CM | POA: Diagnosis not present

## 2018-12-27 DIAGNOSIS — E785 Hyperlipidemia, unspecified: Secondary | ICD-10-CM | POA: Diagnosis not present

## 2018-12-27 DIAGNOSIS — Z794 Long term (current) use of insulin: Secondary | ICD-10-CM

## 2018-12-27 DIAGNOSIS — G4733 Obstructive sleep apnea (adult) (pediatric): Secondary | ICD-10-CM

## 2018-12-27 DIAGNOSIS — R471 Dysarthria and anarthria: Secondary | ICD-10-CM

## 2018-12-27 DIAGNOSIS — E118 Type 2 diabetes mellitus with unspecified complications: Secondary | ICD-10-CM

## 2018-12-27 MED ORDER — ROSUVASTATIN CALCIUM 40 MG PO TABS: 40.0000 mg | ORAL_TABLET | Freq: Every day | ORAL | 0 refills | Status: DC

## 2018-12-27 MED ORDER — CLOPIDOGREL BISULFATE 75 MG PO TABS: 75.0000 mg | ORAL_TABLET | Freq: Every day | ORAL | 0 refills | Status: DC

## 2018-12-27 MED ORDER — SERTRALINE HCL 50 MG PO TABS: 50.0000 mg | ORAL_TABLET | Freq: Every day | ORAL | 0 refills | Status: AC

## 2018-12-27 NOTE — Patient Instructions (Addendum)
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 I also advised the patient to eat a healthy diet with plenty of whole grains, cereals, fruits and vegetables, exercise regularly and maintain ideal body weight.  Followup in the future with me in 3 months or call earlier if needed       Thank you for coming to see [place] at [facility]. I hope we have been able to provide you high quality care today.  You may receive a patient satisfaction survey over the next few weeks. We would appreciate your feedback and comments so that we may continue to improve ourselves and the health of our patients.

## 2018-12-27 NOTE — Progress Notes (Signed)
[facility] [address] [place]. [place] [address] [phone]       OFFICE FOLLOW UP NOTE  [name] Date of Birth:  1966/02/07 Medical Record Number:  [id number]   Reason for Referral:  hospital stroke follow up  CHIEF COMPLAINT:  Chief Complaint  Patient presents with  . 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  At prior visit, patient continues to experience dysarthria and was referred to speech therapy along with continued left hemiparesis with current participation in PT.  Today, she continues to have mild dysarthria but denies any additional left hemiparesis.  Despite ST/OT orders placed to neuro rehab, patient was unfortunately never called to schedule appointments. She does endorse improvement of her speech along with her daughter agreeing. Left hemiparesis greatly improved almost back to normal per patient.  Zoloft initiated at prior appointment due to history of depression.  She endorses not taking Zoloft daily and may take it 3 times during the week.  She feels as though her depression has been stable and improving over time.  Due to possible statin myalgias, atorvastatin discontinued and Crestor initiated.  She does endorse continued myalgias but not as intense as they were on atorvastatin.  She has not had recent lipid panel obtained.  Continues on  Plavix 75 mg and aspirin 81 mg despite recommendation of discontinuing aspirin but despite prolonged DAPT duration, she denies side effects of bleeding or bruising.  Blood pressure satisfactory at 125/81.  She does monitor glucose levels at  home and typically range 1 15-1 50.  She has not had recent A1c level obtained.  She was started on CPAP for OSA which she continues to endorse compliance.  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  Denies new or worsening stroke/TIA symptoms.    History summary:  Cindy [name] is being seen today for initial visit in the office for right BG/CR/external capsule infarcts most likely due to small/large vessel disease given multiple uncontrolled risk factors on 08/18/2018. History obtained from patient, daughter and chart review. Reviewed all radiology images and labs personally.  [name] is a 53 y.o. female with history of hypertension, hyperlipidemia, and diabetes mellitus who presented with a headache, left facial weakness and slurred speech. She did not receive IV t-PA.  CT had reviewed and was negative for acute infarct or abnormality.  MRI brain reviewed and showed small acute/early subacute infarct involving the right pontine, posterior limb of internal capsule and caudate body.  CTA head and neck was negative for LVO but did show multiple moderate to severe intracranial first and second order arterial stenosis with bilateral PCA P1/P2 junctions moderate to severe stenosis and distal left MCA M1 severe stenosis.  It was not felt as though IR consultation was needed at that time as she was asymptomatic and recommended continue medical management unless symptomatic recurrent stroke and will consider IR at that time.  2D echo showed an  EF of 55 to 60% without cardiac source of embolus identified.  Etiology of stroke likely due to small/large vessel disease given multiple uncontrolled risk factors.  Patient was on aspirin 325 mg daily PTA and recommended DAPT for 3 months and Plavix alone.  LDL 126 and recommended initiating Lipitor 80 mg daily.  A1c A1c 11.7 and recommended tight  glycemic control with close PCP follow-up for DM management.  Recommended outpatient sleep study for possible OSA.  Patient was discharged home in stable condition with recommended outpatient PT/OT.  Patient is being seen today for hospital follow-up.  She did have an appointment with [name] provider Dr. Rexene Alberts and recommended her going split-night study to rule out OSA which patient states was performed on 09/19/2018.  She does continue to have dysarthria without noticeable improvement but has not had any type of speech therapy since discharge.  She does feel as though her speech worsens with fatigue.  She does have intermittent balance difficulties with mild left hemiparesis with mild improvement with participation in PT.  She has continued to take both aspirin and Plavix without side effects of bleeding or bruising.  Continues to take Lipitor but does endorse new onset cramping in her legs and back.  Blood pressure today satisfactory at 137/98.  Does monitor glucose levels at home and typically range from 1 16-2 10.  This is continued to be managed by her PCP.  She does endorse increased depression and anxiety since her stroke and becomes tearful during appointment as she is frustrated that she has been unable to return to work at this time and that she has not made much improvement.  She denies any depression in the past.  Denies any suicidal ideation or plan.  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  The second job is also at a group home but this is where she is more active and assist with bathing, cleaning, laundry and meal cooking which she also works Monday through Friday 3pm-11pm.  Her daughter does work at both of these facilities on the same shifts.  Per daughter, patient was prescribed clonazepam by her PCP but she has  not started this medication at this time.  No further concerns at this time.  Denies new or worsening stroke/TIA symptoms.   ROS:   14 system review of systems performed and negative with exception of blurred vision, cough, leg swelling, runny nose, apnea, snoring, joint pain, back pain, aching muscles, muscle cramps and speech difficulty  PMH:  Past Medical History:  Diagnosis Date  . Diabetes mellitus   . GERD (gastroesophageal reflux disease)   . Hyperlipemia   . Hypertension   . Stroke [facility])     PSH:  Past Surgical History:  Procedure Laterality Date  . None      Social History:  Social History   Socioeconomic History  . Marital status: Single    Spouse name: Not on file  . Number of children: 2  . Years of education: Not on file  . Highest education level: Not on file  Occupational History  . Occupation: Cindy [name]  . Financial resource strain: Not on file  . Food insecurity:    Worry: Not on file    Inability: Not on file  . Transportation needs:  Medical: Not on file    Non-medical: Not on file  Tobacco Use  . Smoking status: Never Smoker  . Smokeless tobacco: Never Used  Substance and Sexual Activity  . Alcohol use: No  . Drug use: No  . Sexual activity: Never  Lifestyle  . Physical activity:    Days per week: Not on file    Minutes per session: Not on file  . Stress: Not on file  Relationships  . Social connections:    Talks on phone: Not on file    Gets together: Not on file    Attends religious service: Not on file    Active member of club or organization: Not on file    Attends meetings of clubs or organizations: Not on file    Relationship status: Not on file  . Intimate partner violence:    Fear of current or ex partner: Not on file    Emotionally abused: Not on file    Physically abused: Not on file    Forced sexual activity: Not on file  Other Topics Concern  . Not on file  Social History Narrative  . Not  on file    Family History:  Family History  Problem Relation Age of Onset  . Diabetes Mother   . Hypertension Mother   . Breast cancer Mother   . Stroke Mother   . Hypertension Father   . Heart failure Father   . Breast cancer Maternal Grandmother   . Stroke Brother   . Colon cancer Neg Hx   . Stomach cancer Neg Hx   . Rectal cancer Neg Hx   . Esophageal cancer Neg Hx   . Liver cancer Neg Hx     Medications:   Current Outpatient Medications on File Prior to Visit  Medication Sig Dispense Refill  . aspirin 81 MG tablet Take 4 tablets (325 mg total) by mouth daily. 90 tablet 0  . clopidogrel (PLAVIX) 75 MG tablet Take 1 tablet (75 mg total) by mouth daily. 90 tablet 1  . Insulin Glargine (LANTUS Burke) Inject 30 Units into the skin at bedtime.    . Insulin Syringes, Disposable, U-100 0.5 ML MISC 30 Units by Does not apply route 2 (two) times daily with a meal. 100 each 3  . metFORMIN (GLUCOPHAGE) 1000 MG tablet Take 1,000 mg by mouth 2 (two) times daily with a meal.    . rosuvastatin (CRESTOR) 40 MG tablet Take 1 tablet (40 mg total) by mouth daily. 30 tablet 3  . sertraline (ZOLOFT) 50 MG tablet Take 1 tablet (50 mg total) by mouth at bedtime. 30 tablet 3   No current facility-administered medications on file prior to visit.     Allergies:   Allergies  Allergen Reactions  . Other     Hives to an unknown medication given in 2012 at [place]:   12/27/18 1009  BP: 125/81  Pulse: 80  Weight: 240 lb (108.9 kg)   Body mass index is 42.51 kg/m. No exam data present  General: Obese pleasant middle-aged African-American female,, seated, in no evident distress Head: head normocephalic and atraumatic.   Neck: supple with no carotid or supraclavicular bruits Cardiovascular: regular rate and rhythm, no murmurs Musculoskeletal: no deformity Skin:  no rash/petichiae Vascular:  Normal pulses all extremities  Neurologic Exam Mental Status: Awake  and fully alert.  Mild dysarthria.  Oriented to place and time. Recent and remote memory intact.  Attention span, concentration and fund of knowledge appropriate. Mood and affect appropriate.  Cranial Nerves: Fundoscopic exam reveals sharp disc margins. Pupils equal, briskly reactive to light. Extraocular movements full without nystagmus. Visual fields full to confrontation. Hearing intact. Facial sensation intact.   Motor: Normal bulk and tone.  Strong in all extremities. full strength in right upper and lower extremity Sensory.: intact to touch , pinprick , position and vibratory sensation.  Coordination: Rapid alternating movements normal in all extremities. Finger-to-nose and heel-to-shin performed accurately bilaterally.  Slight decreased left hand dexterity Gait and [name]: Arises from chair without difficulty. Stance is normal. Gait demonstrates normal stride length and balance . Able to heel, toe and tandem walk with mild difficulty.  Romberg negative. Reflexes: 1+ and symmetric. Toes downgoing.      Diagnostic Data (Labs, Imaging, Testing)  Ct Head Wo Contrast 08/18/2018 IMPRESSION:  Negative non contrasted CT appearance of the brain.   Mr Brain 43 Contrast Mr Mrv Head Wo Cm 08/19/2018 IMPRESSION:  1. Small acute/early subacute infarct involving the right putamen, posterior limb of internal capsule, and caudate body. No associated hemorrhage or mass effect.  2. No evidence of dural venous sinus thrombosis.  3. Mild chronic microvascular ischemic changes of the brain.   CTA Head and Neck 08/19/2018 IMPRESSION: 1. Negative for large vessel occlusion, but positive for multiple moderate to severe intracranial 1st and 2nd order arterial stenoses: - bilateral PCA P1/P2 junctions (moderate to severe). - distal Left MCA M1 (severe). - Right MCA posterior M2 origin (moderate). 2. 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 3. No  significant arterial stenosis in the neck. Highly tortuous cervical ICAs. 4. Subtle evidence of the acute small vessel ischemia in the posterior right deep gray matter on CT. No new No acute intracranial abnormality.    Transthoracic Echocardiogram 08/19/2018 Study Conclusions - Left ventricle: The cavity size was normal. Wall thickness was increased in a pattern of mild LVH. Systolic function was normal. The estimated ejection fraction was in the range of 55% to 60%. Wall motion was normal; there were no regional wall motion abnormalities. Impressions: - No cardiac source of emboli was indentified.    ASSESSMENT: Cindy [name] is a 53 y.o. 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 Vascular risk factors include intracranial stenosis, HTN, HLD, DM and obesity.  She is being seen today for follow-up visit with continued deficits of dysarthria and does endorse improvement of left hemiparesis.    PLAN: -Continue clopidogrel 75 mg daily and Crestor for secondary stroke prevention -Discontinue aspirin 81 mg at this time and continue Plavix only -Advised to start co-Q10 300 mg daily to assist with muscle cramping.  Advised her after a few weeks if she continues to experience muscle cramping, we can consider initiating a different type of therapy -Obtain lipid panel and A1c as this had not been completed since hospital admission -Advised patient to take Zoloft 50 mg every night as she will gain greater benefit along with decreasing chances of withdrawal symptoms -F/u with PCP regarding your HLD, HTN and DM management -Advised her to schedule appointment with ST/OT at our neuro rehab clinic prior to leaving building today -She was not cleared to return to driving at this time as request evaluation by OT to ensure safety with reaction time and  multitasking -Return to part-time work with work note provided -f/u  with Dr. Rexene Alberts for OSA on CPAP for continued management -continue to monitor BP at home -advised to continue to stay active and maintain a healthy diet -Maintain strict control of hypertension with blood pressure goal below 130/90, diabetes with hemoglobin A1c goal below 6.5% and cholesterol with LDL cholesterol (bad cholesterol) goal below 70 mg/dL. I also advised the patient to eat a healthy diet with plenty of whole grains, cereals, fruits and vegetables, exercise regularly and maintain ideal body weight.  Follow up in 3 months or call earlier if needed   Greater than 50% of time during this 25 minute visit was spent on counseling,explanation of diagnosis of right BG/CR infarct, reviewing risk factor management of intracranial stenosis, HTN, HLD, DM and obesity, planning of further management, discussion with patient and family and coordination of care    Venancio Poisson, AGNP-BC  [facility] [address] [place], [place] [address]  Phone [phone] Fax [phone] Note: This document was prepared with digital dictation and possible smart phrase technology. Any transcriptional errors that result from this process are unintentional.

## 2018-12-28 ENCOUNTER — Other Ambulatory Visit: Payer: Self-pay | Admitting: Adult Health

## 2018-12-28 ENCOUNTER — Telehealth: Payer: Self-pay

## 2018-12-28 LAB — LIPID PANEL
Chol/HDL Ratio: 4 ratio (ref 0.0–4.4)
Cholesterol, Total: 158 mg/dL (ref 100–199)
HDL: 40 mg/dL (ref >39)
LDL CALC: 100 mg/dL — AB (ref 0–99)
TRIGLYCERIDES: 91 mg/dL (ref 0–149)
VLDL Cholesterol Cal: 18 mg/dL (ref 5–40)

## 2018-12-28 LAB — HEMOGLOBIN A1C
Est. average glucose Bld gHb Est-mCnc: 289 mg/dL
Hgb A1c MFr Bld: 11.7 % — ABNORMAL HIGH (ref 4.8–5.6)

## 2018-12-28 MED ORDER — EZETIMIBE 10 MG PO TABS: 10.0000 mg | ORAL_TABLET | Freq: Every day | ORAL | 3 refills | Status: DC

## 2018-12-28 NOTE — Telephone Encounter (Signed)
-----   Message from Cindy Poisson, NP sent at 12/28/2018  6:54 AM EST ----- Please advise patient that her bad cholesterol or LDL continues to be elevated at 100 with goal of less than 70.  As she was unable to tolerate higher statin therapy, Zetia 10 mg daily will be initiated.  Recommend repeat levels in 2 to 3 months time.

## 2018-12-28 NOTE — Progress Notes (Signed)
I agree with the above plan 

## 2018-12-28 NOTE — Telephone Encounter (Signed)
Notes recorded by Marval Regal, RN on 12/28/2018 at 4:03 PM EST Left vm for patient to call back about her lab work and new statin medication

## 2019-01-01 NOTE — Telephone Encounter (Signed)
I called pt on [phone] and LVM asking for call back.

## 2019-01-02 ENCOUNTER — Ambulatory Visit: Payer: BLUE CROSS/BLUE SHIELD | Admitting: Occupational Therapy

## 2019-01-02 ENCOUNTER — Ambulatory Visit: Payer: BLUE CROSS/BLUE SHIELD

## 2019-01-02 NOTE — Telephone Encounter (Signed)
Left vm for patients daughter Angelica Therriault to cal back about her moms lab work and rx sent to her Scientist, research (physical sciences).

## 2019-01-03 NOTE — Telephone Encounter (Signed)
Pt called back. Please return call to pt as soon as available.

## 2019-01-03 NOTE — Telephone Encounter (Signed)
Left vm for patient to call back about lab work and new medication for cholesterol to be initiated.

## 2019-01-03 NOTE — Telephone Encounter (Addendum)
I called patient about her lab work. I stated that her bad cholesterol or LDL continues to be elevated at 100 with goal of less than 70. As she was unable to tolerate higher statin therapy, Zetia 10 mg daily will be initiated. Recommend repeat levels in 2 to 3 months time. I recommend she pick up crestor and zeita rx from pharmacy and take them together. Also Janett Billow NP recommend otc CoQ10 300 daily to help with muscle cramps while taking both statins. Pt verbalized understanding. ------

## 2019-01-03 NOTE — Telephone Encounter (Signed)
Sent mychart message for patient to call back about her lab work, and zetia sent to pharmacy.

## 2019-01-18 ENCOUNTER — Ambulatory Visit: Payer: Self-pay | Admitting: Neurology

## 2019-03-13 ENCOUNTER — Telehealth: Payer: Self-pay

## 2019-03-13 NOTE — Telephone Encounter (Signed)
Receive a fax from [facility] that pt may be nonaderent and not refilling zoloft medication.

## 2019-04-05 ENCOUNTER — Ambulatory Visit: Payer: BLUE CROSS/BLUE SHIELD | Admitting: Adult Health

## 2019-06-22 IMAGING — CT CT ANGIO HEAD
1 of 8 series · 6 of 33 positions shown · IV contrast (APPLIED)
Comparison: Head CT and brain MRI and intracranial MRV 08/18/2018.

CLINICAL DATA: 51-year-old female with acute small vessel infarcts
in the deep right gray matter nuclei on MRI yesterday performed for
slurred speech and facial droop.

EXAM:
CT ANGIOGRAPHY HEAD AND NECK
TECHNIQUE: Multidetector CT imaging of the head and neck was performed using
the standard protocol during bolus administration of intravenous
contrast. Multiplanar CT image reconstructions and MIPs were
obtained to evaluate the vascular anatomy. Carotid stenosis
measurements (when applicable) are obtained utilizing NASCET
criteria, using the distal internal carotid diameter as the
denominator.
CONTRAST:  50mL [id number] IOPAMIDOL ([id number]) INJECTION 76%

[Series 7: ax thins · axial · 0.52mm/px · z∈[-299,-73]mm · 6 of 318 slices shown]
[im 46/318  soft-tissue]
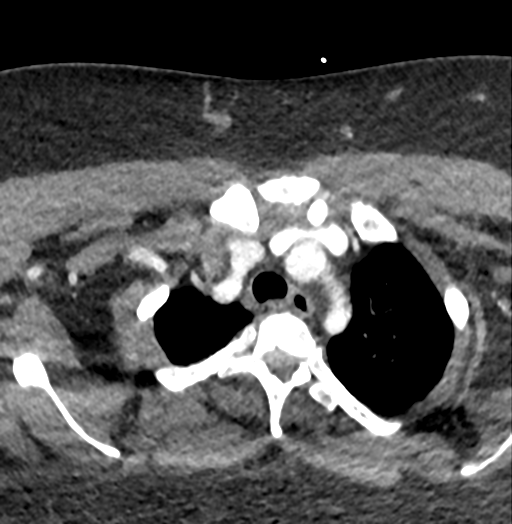
[im 91/318  bone]
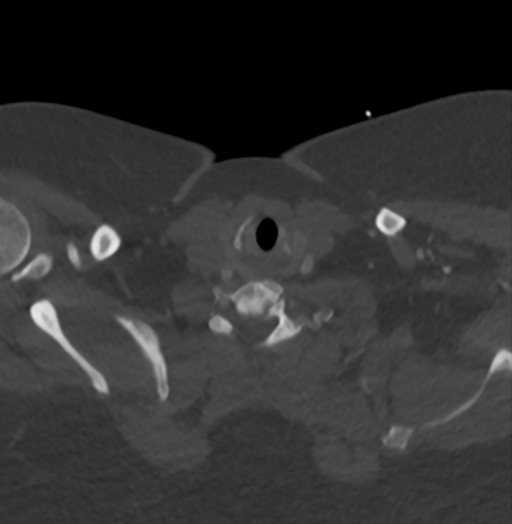
[im 136/318  soft-tissue]
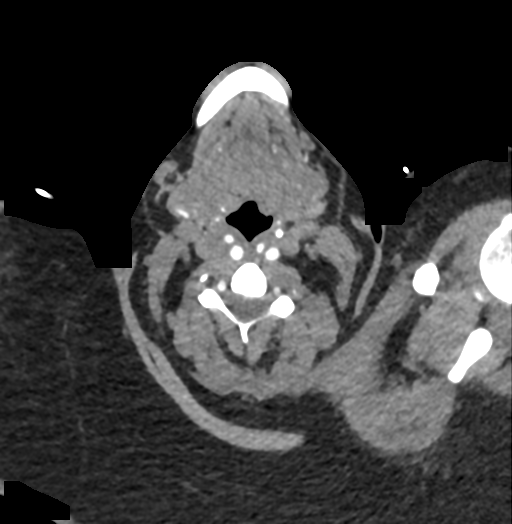
[im 182/318  bone]
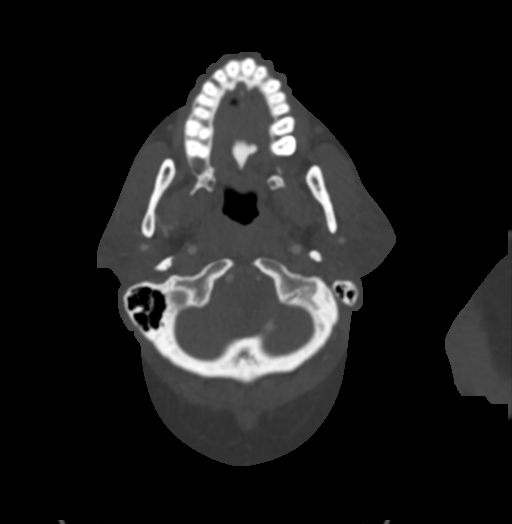
[im 227/318  soft-tissue]
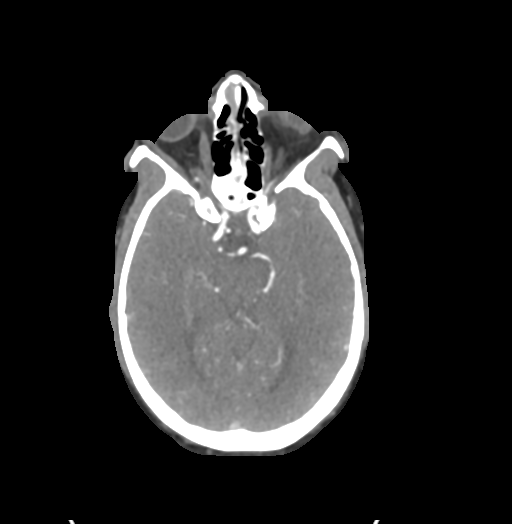
[im 272/318  bone]
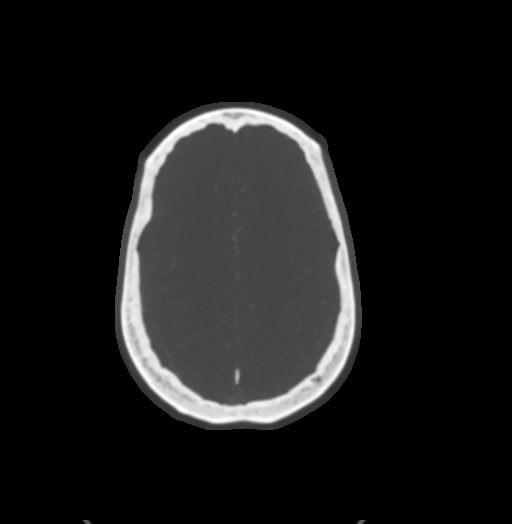

[6 of 33 positions shown; findings below may reference images not displayed]

FINDINGS: CTA NECK

Skeleton: No acute osseous abnormality identified.

Upper chest: Negative visible superior mediastinum and upper lungs.

Other neck: Large body habitus. Retropharyngeal course of both
carotid arteries. Negative for neck mass or lymphadenopathy.

Aortic arch: Bovine aortic arch configuration with shared origin of
the common carotid arteries from the brachiocephalic (series 8,
image 114). No arch atherosclerosis.

Right carotid system: Mildly tortuous right CCA with a partially
retropharyngeal course. Minimal calcified plaque at the right
carotid bifurcation. Highly tortuous cervical right ICA without
stenosis.

Left carotid system: Bovine left CCA origin without stenosis.
Tortuous left CCA with a partially retropharyngeal course. No left
carotid bifurcation plaque or stenosis. Highly tortuous cervical
left ICA without stenosis.

Vertebral arteries:
No proximal right subclavian artery plaque or stenosis. There is
evidence of soft plaque at the right vertebral artery origin
resulting in mild stenosis best demonstrated on series 11, image 27.
Dominant right vertebral artery is otherwise patent to the skull
base without stenosis.

Tortuous proximal left subclavian artery without plaque or stenosis.
The left vertebral artery origin is obscured by motion. The left
vertebral artery is non dominant but patent to the skull base.

CTA HEAD

Posterior circulation: Dominant distal right vertebral artery. No
distal vertebral artery stenosis. Patent vertebrobasilar junction.
The right AICA appears dominant with a patent origin. In the right
vertebral artery V4 segment projecting laterally and slightly
posteriorly there is a small 2 millimeter saccular lesion best seen
on series 7, image 133 and series 8, image 121. This was not evident
on the MRI.

Patent basilar artery with tortuosity but no stenosis. SCA and PCA
origins are normal. Posterior communicating arteries are diminutive
or absent.

There is moderate to severe bilateral PCA distal P1 and/or P2
segment irregularity and stenosis. See series 10, image 20 and
series 12, image 15. There is preserved distal PCA enhancement. Left
PCA P3 branches are mildly to moderately irregular.

Anterior circulation: Both ICA siphons are patent. There is mild
stenosis of the bilateral anterior genu and the left ICA
supraclinoid segment which appears due to a combination of soft
plaque on the right and partially calcified plaque on the left.
Normal ophthalmic artery origins and patent carotid termini. Normal
MCA and ACA origins. Anterior communicating artery is diminutive or
absent. There is mild irregularity of the right ACA A2 segment
(series 12, image 18. Bilateral ACA branches are otherwise within
normal limits.

Patent bilateral MCA M1 segments, bifurcations, and MCA branches.
However, there is severe distal left M1 stenosis just proximal to
the bifurcation best demonstrated on series 10, image 18. On the
right there is moderate to severe stenosis at the posterior right
MCA M2 origin. See series 10, image 20. There is occasional
bilateral MCA M3 branch mild-to-moderate irregularity.

Venous sinuses: Patent on the delayed images.

Anatomic variants: Bovine arch configuration. Dominant right
vertebral artery.

Delayed phase: No abnormal enhancement identified. No intracranial
hemorrhage or mass effect. Largely stable gray-white matter
differentiation since yesterday, with only subtle hypodensity at the
posterior right deep gray matter corresponding to the diffusion MRI
abnormality.

Review of the MIP images confirms the above findings
IMPRESSION: 1. Negative for large vessel occlusion, but positive for multiple
moderate to severe intracranial 1st and 2nd order arterial stenoses:
- bilateral PCA P1/P2 junctions (moderate to severe).
- distal Left MCA M1 (severe).
- Right MCA posterior M2 origin (moderate).
2. Positive also for a tiny 2 mm right vertebral artery V4 segment
aneurysm versus infundibulum.
See series 8 image 121.
Neuro-Interventional Radiology consultation is suggested to evaluate
the appropriateness of potential treatment. Non-emergent evaluation
can be arranged by calling [phone] during usual hours.
Emergency evaluation can be requested by paging [phone].
3. No significant arterial stenosis in the neck. Highly tortuous
cervical ICAs.
4. Subtle evidence of the acute small vessel ischemia in the
posterior right deep gray matter on CT. No new No acute intracranial
abnormality.

## 2019-09-13 ENCOUNTER — Other Ambulatory Visit: Payer: Self-pay

## 2019-09-13 DIAGNOSIS — Z20822 Contact with and (suspected) exposure to covid-19: Secondary | ICD-10-CM

## 2019-09-15 LAB — NOVEL CORONAVIRUS, NAA: SARS-CoV-2, NAA: NOT DETECTED

## 2019-10-15 ENCOUNTER — Other Ambulatory Visit: Payer: Self-pay

## 2019-10-15 DIAGNOSIS — Z20822 Contact with and (suspected) exposure to covid-19: Secondary | ICD-10-CM

## 2019-10-17 LAB — NOVEL CORONAVIRUS, NAA: SARS-CoV-2, NAA: NOT DETECTED

## 2019-10-29 ENCOUNTER — Other Ambulatory Visit: Payer: Self-pay

## 2019-10-29 ENCOUNTER — Emergency Department (HOSPITAL_COMMUNITY)
Admission: EM | Admit: 2019-10-29 | Discharge: 2019-10-30 | Disposition: A | Discharge status: Home / Self Care | Payer: BLUE CROSS/BLUE SHIELD | Attending: Emergency Medicine | Admitting: Emergency Medicine

## 2019-10-29 ENCOUNTER — Encounter (HOSPITAL_COMMUNITY): Payer: Self-pay | Admitting: Emergency Medicine

## 2019-10-29 DIAGNOSIS — R519 Headache, unspecified: Secondary | ICD-10-CM | POA: Insufficient documentation

## 2019-10-29 DIAGNOSIS — Z5321 Procedure and treatment not carried out due to patient leaving prior to being seen by health care provider: Secondary | ICD-10-CM | POA: Insufficient documentation

## 2019-10-29 NOTE — ED Triage Notes (Signed)
Pt. Stated, I ve got a sharpe pain in my rt. Side of my head and it goes down my neck.This started around 1730

## 2019-10-29 NOTE — ED Triage Notes (Signed)
Pt. Stated, ive had a stroke before back in august.. pt. VAN negative at triage. Alert and oriented x 4. o arm drift, symmetrical smile

## 2019-10-30 ENCOUNTER — Encounter (HOSPITAL_COMMUNITY): Payer: Self-pay

## 2019-10-30 ENCOUNTER — Ambulatory Visit (HOSPITAL_COMMUNITY)
Admission: EM | Admit: 2019-10-30 | Discharge: 2019-10-30 | Disposition: A | Discharge status: Home / Self Care | Payer: Self-pay | Attending: Emergency Medicine | Admitting: Emergency Medicine

## 2019-10-30 ENCOUNTER — Other Ambulatory Visit: Payer: Self-pay

## 2019-10-30 DIAGNOSIS — R519 Headache, unspecified: Secondary | ICD-10-CM

## 2019-10-30 MED ORDER — ACETAMINOPHEN 500 MG PO TABS: 500.0000 mg | ORAL_TABLET | Freq: Four times a day (QID) | ORAL | 0 refills | Status: DC | PRN

## 2019-10-30 NOTE — ED Triage Notes (Signed)
Pt states she has a headache that started yesterday  and pt states her vision got dark in her left eye. Pt states she has was at the ER yesterday. Pt states she left without being seen after waiting 5 hours.  Pt states she was able to get a EKG.

## 2019-10-30 NOTE — ED Provider Notes (Signed)
[place]    CSN: [id number] Arrival date & time: 10/30/19  1125      History   Chief Complaint Chief Complaint  Patient presents with  . Headache    HPI Cindy [name] is a 53 y.o. female.   She stated her daughter prescribed her a glass due to blurry vision but was unable to  get it due to cost and not having vision insurance  The history is provided by the patient. No language interpreter was used.  Headache Pain location:  R temporal Quality:  Sharp Radiates to:  R neck Severity currently:  6/10 Severity at highest:  9/10 Onset quality:  Gradual Duration:  2 weeks Progression:  Waxing and waning Chronicity:  New Similar to prior headaches: yes   Context: bright light, caffeine, emotional stress and loud noise   Context: not activity and not coughing   Relieved by:  Acetaminophen Worsened by:  Light (loud noise, stress) Ineffective treatments:  Acetaminophen Associated symptoms: blurred vision   Associated symptoms: no eye pain, no facial pain, no fatigue, no fever and no sore throat     Past Medical History:  Diagnosis Date  . Diabetes mellitus   . GERD (gastroesophageal reflux disease)   . Hyperlipemia   . Hypertension   . Stroke [facility])     Patient Active Problem List   Diagnosis Date Noted  . Cerebrovascular accident (CVA) ([employer]) 08/19/2018  . OBESITY 07/01/2010  . Type 2 diabetes mellitus with complication, with long-term current use of insulin ([place]) 05/08/2008  . HYPERLIPIDEMIA 05/08/2008  . HYPERTENSION 05/08/2008    Past Surgical History:  Procedure Laterality Date  . None      OB History   No obstetric history on file.      Home Medications    Prior to Admission medications   Medication Sig Start Date End Date Taking? Authorizing Provider  acetaminophen (TYLENOL) 500 MG tablet Take 1 tablet (500 mg total) by mouth every 6 (six) hours as needed. 10/30/19   Emanuella Nickle, Darrelyn Hillock, FNP  clopidogrel (PLAVIX) 75 MG tablet  Take 1 tablet (75 mg total) by mouth daily. 12/27/18   Frann Rider, NP  ezetimibe (ZETIA) 10 MG tablet Take 1 tablet (10 mg total) by mouth daily. 12/28/18   Frann Rider, NP  Insulin Glargine (LANTUS [place]) Inject 30 Units into the skin at bedtime.    [provider]  Insulin Syringes, Disposable, U-100 0.5 ML MISC 30 Units by Does not apply route 2 (two) times daily with a meal. 08/20/18   Kathi Ludwig, MD  rosuvastatin (CRESTOR) 40 MG tablet Take 1 tablet (40 mg total) by mouth daily. 12/27/18   Frann Rider, NP  sertraline (ZOLOFT) 50 MG tablet Take 1 tablet (50 mg total) by mouth at bedtime. 12/27/18   Frann Rider, NP  metFORMIN (GLUCOPHAGE) 1000 MG tablet Take 1,000 mg by mouth 2 (two) times daily with a meal.  10/30/19  [provider]    Family History Family History  Problem Relation Age of Onset  . Diabetes Mother   . Hypertension Mother   . Breast cancer Mother   . Stroke Mother   . Hypertension Father   . Heart failure Father   . Breast cancer Maternal Grandmother   . Stroke Brother   . Colon cancer Neg Hx   . Stomach cancer Neg Hx   . Rectal cancer Neg Hx   . Esophageal cancer Neg Hx   . Liver cancer Neg Hx  Social History Social History   Tobacco Use  . Smoking status: Never Smoker  . Smokeless tobacco: Never Used  Substance Use Topics  . Alcohol use: No  . Drug use: No     Allergies   Other   Review of Systems Review of Systems  Constitutional: Negative for activity change, appetite change, chills, diaphoresis, fatigue and fever.  HENT: Negative.  Negative for sore throat.   Eyes: Positive for blurred vision. Negative for pain.  Respiratory: Negative.   Cardiovascular: Negative.   Neurological: Positive for headaches.  ROS: All other are negatives   Physical Exam Triage Vital Signs ED Triage Vitals  Enc Vitals Group     BP 10/30/19 1158 (!) 155/74     Pulse Rate 10/30/19 1158 74     Resp 10/30/19 1158 18     Temp  10/30/19 1158 98 F (36.7 C)     Temp Source 10/30/19 1158 Oral     SpO2 10/30/19 1158 100 %     Weight 10/30/19 1157 245 lb (111.1 kg)     Height --      Head Circumference --      Peak Flow --      Pain Score 10/30/19 1157 6     Pain Loc --      Pain Edu? --      Excl. in [place]? --    No data found.  Updated Vital Signs BP (!) 155/74 (BP Location: Right Arm)   Pulse 74   Temp 98 F (36.7 C) (Oral)   Resp 18   Wt 245 lb (111.1 kg)   SpO2 100%   BMI 43.40 kg/m   Visual Acuity Right Eye Distance:   Left Eye Distance:   Bilateral Distance:    Right Eye Near:   Left Eye Near:    Bilateral Near:     Physical Exam Vitals signs and nursing note reviewed.  Constitutional:      General: She is not in acute distress.    Appearance: She is well-developed and normal weight.  Cardiovascular:     Rate and Rhythm: Normal rate and regular rhythm.     Heart sounds: Normal heart sounds. No murmur.  Pulmonary:     Effort: Pulmonary effort is normal. No respiratory distress.     Breath sounds: Normal breath sounds.  Chest:     Chest wall: No tenderness.  Neurological:     Mental Status: She is alert and oriented to person, place, and time.     Cranial Nerves: Cranial nerves are intact. No cranial nerve deficit.     Sensory: Sensation is intact.     Motor: Motor function is intact.     Coordination: Coordination is intact.     Deep Tendon Reflexes:     Reflex Scores:      Patellar reflexes are 2+ on the right side and 2+ on the left side.     UC Treatments / Results  Labs (all labs ordered are listed, but only abnormal results are displayed) Labs Reviewed - No data to display  EKG   Radiology No results found.  Procedures Procedures (including critical care time)  Medications Ordered in UC Medications - No data to display  Initial Impression / Assessment and Plan / UC Course  I have reviewed the triage vital signs and the nursing notes.  Pertinent labs &  imaging results that were available during my care of the patient were reviewed by me and considered in  my medical decision making (see chart for details).   Patient BP was high and she stated she is currently taking lisinopril but didn't take it this AM. She is also having a blurred vision for a long time as she couldn't afford her glasses. BP recheck is 132/78. Neuro assessment was intact.  Tylenol was prescribed to manage her pain and to return if symptom get worse Final Clinical Impressions(s) / UC Diagnoses   Final diagnoses:  Acute nonintractable headache, unspecified headache type     Discharge Instructions     Advised patient to take her BP medications as prescribed Managed stress Low salt and low carb diet Take tylenol as prescribed for pain and with food Follow up with PCP or return if symptom get worse    ED Prescriptions    Medication Sig Dispense Auth. Provider   acetaminophen (TYLENOL) 500 MG tablet Take 1 tablet (500 mg total) by mouth every 6 (six) hours as needed. 60 tablet Avedis Bevis, Darrelyn Hillock, FNP     PDMP not reviewed this encounter.   Emerson Monte, [place] 10/30/19 1314

## 2019-10-30 NOTE — Discharge Instructions (Addendum)
Advised patient to take her BP medications as prescribed Managed stress Low salt and low carb diet Take tylenol as prescribed for pain and with food Follow up with PCP or return if symptom get worse

## 2019-10-30 NOTE — ED Notes (Signed)
Patient asks me to take her name out because she is leaving. Advised pt to stay. Patient left ama

## 2019-11-08 ENCOUNTER — Other Ambulatory Visit: Payer: Self-pay

## 2019-11-08 DIAGNOSIS — Z20822 Contact with and (suspected) exposure to covid-19: Secondary | ICD-10-CM

## 2019-11-10 LAB — NOVEL CORONAVIRUS, NAA: SARS-CoV-2, NAA: NOT DETECTED

## 2020-01-17 ENCOUNTER — Ambulatory Visit: Payer: Self-pay

## 2020-01-21 ENCOUNTER — Ambulatory Visit: Payer: Self-pay | Attending: Family

## 2020-01-21 DIAGNOSIS — Z23 Encounter for immunization: Secondary | ICD-10-CM | POA: Insufficient documentation

## 2020-01-21 NOTE — Progress Notes (Signed)
   Covid-19 Vaccination Clinic  Name:  Cindy [name]    MRN: [id number] DOB: Jul 13, 1966  01/21/2020  Cindy [name] was observed post Covid-19 immunization for 15 minutes without incidence. She was provided with Vaccine Information Sheet and instruction to access the V-Safe system.   Cindy [name] was instructed to call 911 with any severe reactions post vaccine: Marland Kitchen Difficulty breathing  . Swelling of your face and throat  . A fast heartbeat  . A bad rash all over your body  . Dizziness and weakness    Immunizations Administered    Name Date Dose VIS Date Route   Moderna COVID-19 Vaccine 01/21/2020  9:16 AM 0.5 mL 10/30/2019 Intramuscular   Manufacturer: Moderna   Lot: [id number]   [id number]

## 2020-02-19 ENCOUNTER — Ambulatory Visit: Payer: Self-pay | Attending: Family

## 2020-02-19 DIAGNOSIS — Z23 Encounter for immunization: Secondary | ICD-10-CM

## 2020-02-19 NOTE — Progress Notes (Signed)
   Covid-19 Vaccination Clinic  Name:  Cindy [name]    MRN: [id number] DOB: 1966-06-24  02/19/2020  Cindy [name] was observed post Covid-19 immunization for 30 minutes based on pre-vaccination screening without incident. She was provided with Vaccine Information Sheet and instruction to access the V-Safe system.   Cindy [name] was instructed to call 911 with any severe reactions post vaccine: Marland Kitchen Difficulty breathing  . Swelling of face and throat  . A fast heartbeat  . A bad rash all over body  . Dizziness and weakness   Immunizations Administered    Name Date Dose VIS Date Route   Moderna COVID-19 Vaccine 02/19/2020  9:44 AM 0.5 mL 10/30/2019 Intramuscular   Manufacturer: Moderna   Lot: [id number]   [place]:[id number]

## 2020-07-06 ENCOUNTER — Other Ambulatory Visit: Payer: Self-pay

## 2020-07-06 ENCOUNTER — Emergency Department (HOSPITAL_COMMUNITY): Payer: 59

## 2020-07-06 ENCOUNTER — Encounter (HOSPITAL_COMMUNITY): Payer: Self-pay | Admitting: *Deleted

## 2020-07-06 ENCOUNTER — Inpatient Hospital Stay (HOSPITAL_COMMUNITY)
Admission: EM | Admit: 2020-07-06 | Discharge: 2020-07-09 | DRG: 247 | Disposition: A | Discharge status: Home / Self Care | Payer: 59 | Attending: Internal Medicine | Admitting: Internal Medicine

## 2020-07-06 DIAGNOSIS — K219 Gastro-esophageal reflux disease without esophagitis: Secondary | ICD-10-CM | POA: Diagnosis present

## 2020-07-06 DIAGNOSIS — R072 Precordial pain: Secondary | ICD-10-CM

## 2020-07-06 DIAGNOSIS — Z8249 Family history of ischemic heart disease and other diseases of the circulatory system: Secondary | ICD-10-CM

## 2020-07-06 DIAGNOSIS — Z833 Family history of diabetes mellitus: Secondary | ICD-10-CM

## 2020-07-06 DIAGNOSIS — Z794 Long term (current) use of insulin: Secondary | ICD-10-CM

## 2020-07-06 DIAGNOSIS — Z79899 Other long term (current) drug therapy: Secondary | ICD-10-CM | POA: Diagnosis not present

## 2020-07-06 DIAGNOSIS — E118 Type 2 diabetes mellitus with unspecified complications: Secondary | ICD-10-CM

## 2020-07-06 DIAGNOSIS — Z8673 Personal history of transient ischemic attack (TIA), and cerebral infarction without residual deficits: Secondary | ICD-10-CM

## 2020-07-06 DIAGNOSIS — I10 Essential (primary) hypertension: Secondary | ICD-10-CM | POA: Diagnosis present

## 2020-07-06 DIAGNOSIS — I2510 Atherosclerotic heart disease of native coronary artery without angina pectoris: Secondary | ICD-10-CM | POA: Diagnosis present

## 2020-07-06 DIAGNOSIS — Z803 Family history of malignant neoplasm of breast: Secondary | ICD-10-CM

## 2020-07-06 DIAGNOSIS — R079 Chest pain, unspecified: Secondary | ICD-10-CM | POA: Diagnosis not present

## 2020-07-06 DIAGNOSIS — E1165 Type 2 diabetes mellitus with hyperglycemia: Secondary | ICD-10-CM | POA: Diagnosis present

## 2020-07-06 DIAGNOSIS — Z955 Presence of coronary angioplasty implant and graft: Secondary | ICD-10-CM | POA: Diagnosis not present

## 2020-07-06 DIAGNOSIS — I200 Unstable angina: Secondary | ICD-10-CM

## 2020-07-06 DIAGNOSIS — Z7902 Long term (current) use of antithrombotics/antiplatelets: Secondary | ICD-10-CM | POA: Diagnosis not present

## 2020-07-06 DIAGNOSIS — Z20822 Contact with and (suspected) exposure to covid-19: Secondary | ICD-10-CM | POA: Diagnosis present

## 2020-07-06 DIAGNOSIS — E785 Hyperlipidemia, unspecified: Secondary | ICD-10-CM | POA: Diagnosis present

## 2020-07-06 DIAGNOSIS — Z823 Family history of stroke: Secondary | ICD-10-CM

## 2020-07-06 DIAGNOSIS — G4733 Obstructive sleep apnea (adult) (pediatric): Secondary | ICD-10-CM | POA: Diagnosis present

## 2020-07-06 DIAGNOSIS — I25110 Atherosclerotic heart disease of native coronary artery with unstable angina pectoris: Secondary | ICD-10-CM | POA: Diagnosis present

## 2020-07-06 DIAGNOSIS — Z95820 Peripheral vascular angioplasty status with implants and grafts: Secondary | ICD-10-CM

## 2020-07-06 HISTORY — DX: Atherosclerotic heart disease of native coronary artery without angina pectoris: I25.10

## 2020-07-06 HISTORY — DX: Unstable angina: I20.0

## 2020-07-06 LAB — CBC
HCT: 37.4 % (ref 36.0–46.0)
Hemoglobin: 11.4 g/dL — ABNORMAL LOW (ref 12.0–15.0)
MCH: 28.9 pg (ref 26.0–34.0)
MCHC: 30.5 g/dL (ref 30.0–36.0)
MCV: 94.9 fL (ref 80.0–100.0)
Platelets: 363 10*3/uL (ref 150–400)
RBC: 3.94 MIL/uL (ref 3.87–5.11)
RDW: 13.2 % (ref 11.5–15.5)
WBC: 8.1 10*3/uL (ref 4.0–10.5)
nRBC: 0 % (ref 0.0–0.2)

## 2020-07-06 LAB — BASIC METABOLIC PANEL
Anion gap: 12 (ref 5–15)
BUN: 13 mg/dL (ref 6–20)
CO2: 25 mmol/L (ref 22–32)
Calcium: 8.8 mg/dL — ABNORMAL LOW (ref 8.9–10.3)
Chloride: 99 mmol/L (ref 98–111)
Creatinine, Ser: 0.66 mg/dL (ref 0.44–1.00)
GFR calc Af Amer: >60 mL/min (ref >60)
GFR calc non Af Amer: >60 mL/min (ref >60)
Glucose, Bld: 325 mg/dL — ABNORMAL HIGH (ref 70–99)
Potassium: 3.6 mmol/L (ref 3.5–5.1)
Sodium: 136 mmol/L (ref 135–145)

## 2020-07-06 LAB — HEMOGLOBIN A1C
Hgb A1c MFr Bld: 12.3 % — ABNORMAL HIGH (ref 4.8–5.6)
Mean Plasma Glucose: 306.31 mg/dL

## 2020-07-06 LAB — I-STAT BETA HCG BLOOD, ED (MC, WL, AP ONLY): I-stat hCG, quantitative: <5 m[IU]/mL (ref <5)

## 2020-07-06 LAB — TROPONIN I (HIGH SENSITIVITY)
Troponin I (High Sensitivity): 7 ng/L (ref <18)
Troponin I (High Sensitivity): 4 ng/L (ref <18)

## 2020-07-06 LAB — HEPARIN LEVEL (UNFRACTIONATED): Heparin Unfractionated: <0.1 IU/mL — ABNORMAL LOW (ref 0.30–0.70)

## 2020-07-06 LAB — SARS CORONAVIRUS 2 BY RT PCR (HOSPITAL ORDER, PERFORMED IN ~~LOC~~ HOSPITAL LAB): SARS Coronavirus 2: NEGATIVE

## 2020-07-06 LAB — GLUCOSE, CAPILLARY: Glucose-Capillary: 252 mg/dL — ABNORMAL HIGH (ref 70–99)

## 2020-07-06 MED ORDER — ONDANSETRON HCL 4 MG/2ML IJ SOLN: 4.0000 mg | Freq: Four times a day (QID) | INTRAMUSCULAR | Status: DC | PRN

## 2020-07-06 MED ORDER — SODIUM CHLORIDE 0.9% FLUSH: 3.0000 mL | Freq: Once | INTRAVENOUS | Status: DC

## 2020-07-06 MED ORDER — SODIUM CHLORIDE 0.9 % IV SOLN: 250.0000 mL | INTRAVENOUS | Status: DC | PRN

## 2020-07-06 MED ORDER — SODIUM CHLORIDE 0.9% FLUSH: 3.0000 mL | Freq: Two times a day (BID) | INTRAVENOUS | Status: DC

## 2020-07-06 MED ORDER — ASPIRIN EC 81 MG PO TBEC
81.0000 mg | DELAYED_RELEASE_TABLET | Freq: Every day | ORAL | Status: DC
  Administered 2020-07-06: 81 mg via ORAL
  Filled 2020-07-06 (×2): qty 1

## 2020-07-06 MED ORDER — SODIUM CHLORIDE 0.9% FLUSH: 3.0000 mL | INTRAVENOUS | Status: DC | PRN

## 2020-07-06 MED ORDER — SERTRALINE HCL 50 MG PO TABS
50.0000 mg | ORAL_TABLET | Freq: Every day | ORAL | Status: DC
  Administered 2020-07-06 – 2020-07-08 (×3): 50 mg via ORAL
  Filled 2020-07-06 (×4): qty 1

## 2020-07-06 MED ORDER — ROSUVASTATIN CALCIUM 20 MG PO TABS
40.0000 mg | ORAL_TABLET | Freq: Every day | ORAL | Status: DC
  Administered 2020-07-06: 40 mg via ORAL
  Filled 2020-07-06 (×2): qty 2

## 2020-07-06 MED ORDER — ACETAMINOPHEN 325 MG PO TABS
650.0000 mg | ORAL_TABLET | ORAL | Status: DC | PRN
  Filled 2020-07-06: qty 2

## 2020-07-06 MED ORDER — FOSINOPRIL SODIUM-HCTZ 20-12.5 MG PO TABS: 1.0000 | ORAL_TABLET | Freq: Every day | ORAL | Status: DC

## 2020-07-06 MED ORDER — SODIUM CHLORIDE 0.9 % WEIGHT BASED INFUSION
1.0000 mL/kg/h | INTRAVENOUS | Status: DC
  Administered 2020-07-07: 1 mL/kg/h via INTRAVENOUS

## 2020-07-06 MED ORDER — HEPARIN (PORCINE) 25000 UT/250ML-% IV SOLN
1600.0000 [IU]/h | INTRAVENOUS | Status: DC
  Administered 2020-07-06: 950 [IU]/h via INTRAVENOUS
  Filled 2020-07-06: qty 250

## 2020-07-06 MED ORDER — HEPARIN BOLUS VIA INFUSION
4000.0000 [IU] | Freq: Once | INTRAVENOUS | Status: AC
  Administered 2020-07-06: 4000 [IU] via INTRAVENOUS
  Filled 2020-07-06: qty 4000

## 2020-07-06 MED ORDER — ASPIRIN 81 MG PO CHEW
81.0000 mg | CHEWABLE_TABLET | ORAL | Status: AC
  Administered 2020-07-07: 81 mg via ORAL
  Filled 2020-07-06: qty 1

## 2020-07-06 MED ORDER — EZETIMIBE 10 MG PO TABS
10.0000 mg | ORAL_TABLET | Freq: Every day | ORAL | Status: DC
  Administered 2020-07-06 – 2020-07-09 (×4): 10 mg via ORAL
  Filled 2020-07-06 (×4): qty 1

## 2020-07-06 MED ORDER — INSULIN ASPART 100 UNIT/ML ~~LOC~~ SOLN
0.0000 [IU] | Freq: Three times a day (TID) | SUBCUTANEOUS | Status: DC
  Administered 2020-07-07: 4 [IU] via SUBCUTANEOUS
  Administered 2020-07-07: 7 [IU] via SUBCUTANEOUS
  Administered 2020-07-07 – 2020-07-08 (×2): 15 [IU] via SUBCUTANEOUS
  Administered 2020-07-09: 11 [IU] via SUBCUTANEOUS

## 2020-07-06 MED ORDER — SODIUM CHLORIDE 0.9 % WEIGHT BASED INFUSION
3.0000 mL/kg/h | INTRAVENOUS | Status: DC
  Administered 2020-07-07: 3 mL/kg/h via INTRAVENOUS

## 2020-07-06 MED ORDER — LISINOPRIL 20 MG PO TABS
20.0000 mg | ORAL_TABLET | Freq: Every day | ORAL | Status: DC
  Administered 2020-07-06 – 2020-07-09 (×4): 20 mg via ORAL
  Filled 2020-07-06 (×4): qty 1

## 2020-07-06 MED ORDER — METOPROLOL SUCCINATE ER 25 MG PO TB24
25.0000 mg | ORAL_TABLET | Freq: Every day | ORAL | Status: DC
  Administered 2020-07-06 – 2020-07-09 (×4): 25 mg via ORAL
  Filled 2020-07-06 (×4): qty 1

## 2020-07-06 MED ORDER — NITROGLYCERIN 0.4 MG SL SUBL
0.4000 mg | SUBLINGUAL_TABLET | SUBLINGUAL | Status: DC | PRN
  Administered 2020-07-06: 0.4 mg via SUBLINGUAL
  Filled 2020-07-06: qty 1

## 2020-07-06 MED ORDER — HEPARIN BOLUS VIA INFUSION
3000.0000 [IU] | Freq: Once | INTRAVENOUS | Status: AC
  Administered 2020-07-06: 3000 [IU] via INTRAVENOUS
  Filled 2020-07-06: qty 3000

## 2020-07-06 MED ORDER — ASPIRIN 81 MG PO CHEW
324.0000 mg | CHEWABLE_TABLET | Freq: Once | ORAL | Status: AC
  Administered 2020-07-06: 324 mg via ORAL
  Filled 2020-07-06: qty 4

## 2020-07-06 MED ORDER — HYDROCHLOROTHIAZIDE 12.5 MG PO CAPS
12.5000 mg | ORAL_CAPSULE | Freq: Every day | ORAL | Status: DC
  Administered 2020-07-06 – 2020-07-09 (×4): 12.5 mg via ORAL
  Filled 2020-07-06 (×4): qty 1

## 2020-07-06 NOTE — Progress Notes (Addendum)
ANTICOAGULATION CONSULT NOTE - Follow Up Consult  Pharmacy Consult for heparin Indication: chest pain/ACS  Labs: Recent Labs    07/06/20 0500 07/06/20 0616 07/06/20 2245  HGB 11.4*  --   --   HCT 37.4  --   --   PLT 363  --   --   HEPARINUNFRC  --   --  <0.10*  CREATININE 0.66  --   --   TROPONINIHS 7 4  --     Assessment: 53yo female subtherapeutic on heparin with initial dosing for CP; no gtt issues or signs of bleeding per RN.  Goal of Therapy:  Heparin level 0.3-0.7 units/ml   Plan:  Will rebolus with heparin 3000 units and increase heparin gtt by 4 units/kgABW/hr to 1300 units/hr and check level in 6 hours.    Wynona Neat, PharmD, BCPS  07/06/2020,11:33 PM   Addendum: Level this am remains undetectable.  Will rebolus with heparin 3000 units and increase heparin gtt by 4 units/kgABW/hr to 1600 units/hr and check level in 6 hours.    VB 07/07/2020 6:23 AM

## 2020-07-06 NOTE — ED Provider Notes (Signed)
Emergency Department Provider Note   I have reviewed the triage vital signs and the nursing notes.   HISTORY  Chief Complaint Chest Pain   HPI Cindy [name] is a 54 y.o. female with PMH of DM, HLD, HTN, and prior CVA along with family history of AMI with father dying of CAD at age 34 presents to the emergency department for evaluation of central to left-sided chest pressure/heaviness with radiation to the left arm and jaw.  Symptoms began 30 minutes prior to ED presentation.  She has not had similar pain in the past.  Denies shortness of breath or pleuritic pain.  No fevers or chills.  Denies nausea, diaphoresis, vomiting.  No personal history of CAD but family history as noted above. Patient continues to have pain at the time of my assessment.   Past Medical History:  Diagnosis Date  . Diabetes mellitus   . GERD (gastroesophageal reflux disease)   . Hyperlipemia   . Hypertension   . Stroke [facility])     Patient Active Problem List   Diagnosis Date Noted  . Unstable angina ([place]) 07/06/2020  . Precordial chest pain   . Cerebrovascular accident (CVA) ([place]) 08/19/2018  . OBESITY 07/01/2010  . Type 2 diabetes mellitus with complication, with [place]-term current use of insulin ([place]) 05/08/2008  . HYPERLIPIDEMIA 05/08/2008  . HYPERTENSION 05/08/2008    Past Surgical History:  Procedure Laterality Date  . None      Allergies Other  Family History  Problem Relation Age of Onset  . Diabetes Mother   . Hypertension Mother   . Breast cancer Mother   . Stroke Mother   . Hypertension Father   . Heart failure Father   . Breast cancer Maternal Grandmother   . Stroke Brother   . Colon cancer Neg Hx   . Stomach cancer Neg Hx   . Rectal cancer Neg Hx   . Esophageal cancer Neg Hx   . Liver cancer Neg Hx     Social History Social History   Tobacco Use  . Smoking status: Never Smoker  . Smokeless tobacco: Never Used  Vaping Use  . Vaping Use: Never used  Substance  Use Topics  . Alcohol use: No  . Drug use: No    Review of Systems  Constitutional: No fever/chills Eyes: No visual changes. ENT: No sore throat. Cardiovascular: Positive chest pain. Respiratory: Denies shortness of breath. Gastrointestinal: No abdominal pain.  No nausea, no vomiting.  No diarrhea.  No constipation. Genitourinary: Negative for dysuria. Musculoskeletal: Negative for back pain. Skin: Negative for rash. Neurological: Negative for headaches, focal weakness or numbness.  10-point ROS otherwise negative.  ____________________________________________   PHYSICAL EXAM:  VITAL SIGNS: ED Triage Vitals  Enc Vitals Group     BP 07/06/20 0408 (!) 189/95     Pulse Rate 07/06/20 0408 81     Resp 07/06/20 0408 16     Temp 07/06/20 0408 98.6 F (37 C)     Temp Source 07/06/20 0935 Oral     SpO2 07/06/20 0408 100 %   Constitutional: Alert and oriented. Well appearing and in no acute distress. Eyes: Conjunctivae are normal. Head: Atraumatic. Nose: No congestion/rhinnorhea. Mouth/Throat: Mucous membranes are moist.   Neck: No stridor.   Cardiovascular: Normal rate, regular rhythm. Good peripheral circulation. Grossly normal heart sounds.   Respiratory: Normal respiratory effort.  No retractions. Lungs CTAB. Gastrointestinal: Soft and nontender. No distention.  Musculoskeletal: No lower extremity tenderness with trace bilateral pitting  edema. No gross deformities of extremities. Neurologic:  Normal speech and language.  Skin:  Skin is warm, dry and intact. No rash noted.   ____________________________________________   LABS (all labs ordered are listed, but only abnormal results are displayed)  Labs Reviewed  BASIC METABOLIC PANEL - Abnormal; Notable for the following components:      Result Value   Glucose, Bld 325 (*)    Calcium 8.8 (*)    All other components within normal limits  CBC - Abnormal; Notable for the following components:   Hemoglobin 11.4 (*)      All other components within normal limits  HEPARIN LEVEL (UNFRACTIONATED) - Abnormal; Notable for the following components:   Heparin Unfractionated <0.10 (*)    All other components within normal limits  BASIC METABOLIC PANEL - Abnormal; Notable for the following components:   Potassium 3.4 (*)    Glucose, Bld 276 (*)    Calcium 8.4 (*)    All other components within normal limits  HEMOGLOBIN A1C - Abnormal; Notable for the following components:   Hgb A1c MFr Bld 12.3 (*)    All other components within normal limits  GLUCOSE, CAPILLARY - Abnormal; Notable for the following components:   Glucose-Capillary 252 (*)    All other components within normal limits  CBC - Abnormal; Notable for the following components:   RBC 3.67 (*)    Hemoglobin 10.7 (*)    HCT 34.2 (*)    All other components within normal limits  HEPARIN LEVEL (UNFRACTIONATED) - Abnormal; Notable for the following components:   Heparin Unfractionated <0.10 (*)    All other components within normal limits  LIPID PANEL - Abnormal; Notable for the following components:   Cholesterol 219 (*)    Triglycerides 150 (*)    HDL 36 (*)    LDL Cholesterol 153 (*)    All other components within normal limits  GLUCOSE, CAPILLARY - Abnormal; Notable for the following components:   Glucose-Capillary 308 (*)    All other components within normal limits  SARS CORONAVIRUS 2 BY RT PCR (HOSPITAL ORDER, [place] [facility])  HEPARIN LEVEL (UNFRACTIONATED)  I-STAT BETA HCG BLOOD, ED ([facility], [facility], [facility] ONLY)  TROPONIN I (HIGH SENSITIVITY)  TROPONIN I (HIGH SENSITIVITY)   ____________________________________________  EKG   EKG Interpretation  Date/Time:  Sunday July 06 2020 10:02:17 EDT Ventricular Rate:  78 PR Interval:  198 QRS Duration: 103 QT Interval:  415 QTC Calculation: 473 R Axis:   37 Text Interpretation: Sinus rhythm Borderline prolonged PR interval No STEMI Confirmed by Nanda Quinton [phone]) on  07/06/2020 10:06:58 AM       ____________________________________________  RADIOLOGY  CXR reviewed.  ____________________________________________   PROCEDURES  Procedure(s) performed:   Procedures  CRITICAL CARE Performed by: Margette Fast Total critical care time: 35 minutes Critical care time was exclusive of separately billable procedures and treating other patients. Critical care was necessary to treat or prevent imminent or life-threatening deterioration. Critical care was time spent personally by me on the following activities: development of treatment plan with patient and/or surrogate as well as nursing, discussions with consultants, evaluation of patient's response to treatment, examination of patient, obtaining history from patient or surrogate, ordering and performing treatments and interventions, ordering and review of laboratory studies, ordering and review of radiographic studies, pulse oximetry and re-evaluation of patient's condition.  Nanda Quinton, MD Emergency Medicine  ____________________________________________   INITIAL IMPRESSION / ASSESSMENT AND PLAN / ED COURSE  Pertinent labs & imaging results that were available during my care of the patient were reviewed by me and considered in my medical decision making (see chart for details).   Patient presents emergency for evaluation of chest discomfort.  The patient's history is somewhat concerning she does have multiple risk factors for ACS.  Suspicion clinically for PE or aortic pathology.  Chest x-ray, initial EKG, labs including initial troponin of been reviewed and normal.  Repeat EKG once back in the acute care area is unchanged.  Repeat troponin is pending. HEART score of 4.   Second troponin negative. Discussed case with Dr. Cathie Olden with Cardiology given risk factors and CP presentation to consider further provocative testing. The Cardiology service will be down to evaluate. Patient remains CP-free after  nitro SL. Concern clinically for unstable angina.   Discussed patient's case with Cardiology, Dr. Cathie Olden to request admission. Patient and family (if present) updated with plan. Care transferred to Cardiology service.  I reviewed all nursing notes, vitals, pertinent old records, EKGs, labs, imaging (as available).  ____________________________________________  FINAL CLINICAL IMPRESSION(S) / ED DIAGNOSES  Final diagnoses:  Unstable angina ([place])    MEDICATIONS GIVEN DURING THIS VISIT:  Medications  sodium chloride flush (NS) 0.9 % injection 3 mL (has no administration in time range)  nitroGLYCERIN (NITROSTAT) SL tablet 0.4 mg (0.4 mg Sublingual Given 07/06/20 1005)  aspirin EC tablet 81 mg (81 mg Oral Given 07/06/20 2222)  ezetimibe (ZETIA) tablet 10 mg (10 mg Oral Given 07/06/20 2221)  rosuvastatin (CRESTOR) tablet 40 mg (40 mg Oral Given 07/06/20 2223)  sertraline (ZOLOFT) tablet 50 mg (50 mg Oral Given 07/06/20 2216)  acetaminophen (TYLENOL) tablet 650 mg (has no administration in time range)  ondansetron (ZOFRAN) injection 4 mg (has no administration in time range)  metoprolol succinate (TOPROL-XL) 24 hr tablet 25 mg (25 mg Oral Given 07/06/20 2216)  insulin aspart (novoLOG) injection 0-20 Units (15 Units Subcutaneous Given 07/07/20 0657)  sodium chloride flush (NS) 0.9 % injection 3 mL (3 mLs Intravenous Not Given 07/06/20 2231)  sodium chloride flush (NS) 0.9 % injection 3 mL (has no administration in time range)  0.9 %  sodium chloride infusion (has no administration in time range)  0.9% sodium chloride infusion (3 mL/kg/hr  111.1 kg Intravenous New Bag/Given 07/07/20 0410)    Followed by  0.9% sodium chloride infusion (1 mL/kg/hr  111.1 kg Intravenous New Bag/Given 07/07/20 0515)  heparin ADULT infusion 100 units/mL (25000 units/219mL sodium chloride 0.45%) (1,600 Units/hr Intravenous Rate/Dose Change 07/07/20 0644)  lisinopril (ZESTRIL) tablet 20 mg (20 mg Oral Given 07/06/20 2222)    And    hydrochlorothiazide (MICROZIDE) capsule 12.5 mg (12.5 mg Oral Given 07/06/20 2222)  heparin bolus via infusion 3,000 Units (has no administration in time range)  potassium chloride SA (KLOR-CON) CR tablet 40 mEq (has no administration in time range)  aspirin chewable tablet 324 mg (324 mg Oral Given 07/06/20 0954)  aspirin chewable tablet 81 mg (81 mg Oral Given 07/07/20 0638)  heparin bolus via infusion 4,000 Units (4,000 Units Intravenous Bolus from Bag 07/06/20 1717)  heparin bolus via infusion 3,000 Units (3,000 Units Intravenous Bolus from Bag 07/06/20 2340)    Note:  This document was prepared using Dragon voice recognition software and may include unintentional dictation errors.  Nanda Quinton, MD, [facility] Emergency Medicine    Karston Hyland, [facility], MD 07/07/20 [phone]

## 2020-07-06 NOTE — H&P (Addendum)
Cardiology Admission History and Physical:   Patient ID: Cindy [name] MRN: [id number]; DOB: [birth year]   Admission date: 07/06/2020  Primary Care Provider: Lin Landsman, MD [facility] Cardiologist: New to [place] (Dr. Margaretann Loveless) [place] Electrophysiologist:  None   Chief Complaint:  Chest Pain  Patient Profile:   Cindy [name] is a 54 y.o. female with a history of stroke, obstructive sleep apnea on CPAP, hypertension, hyperlipidemia, diabetes mellitus, GERD, and morbid obesity who presents to the ED with chest pain.  History of Present Illness:   [name] is a 54 year old female with the above history. Patient has never seen a Cardiologist and has never had any type of cardiac work-up. She denies any smoking history but does have a family history of premature CAD with her father dying from heart attack at the age of 35. She did have a stroke in 08/2018 and has been maintained on Aspirin and Plavix at that time. Echo at that time showed LVEF of 55-60% with normal wall motion.  Patient presented to the ED for further evaluation of chest pain. She reports was awoken from sleep this morning with 9/10 substernal chest pain that radiated the left side of her neck and left arm with associated shortness of breath, palpitations, and lightheadedness. No associated diaphoresis, nausea, or vomiting. No syncope. Patient also admits that she has been have exertional chest pain over the last few months that seems to be increasing in severity and frequency. No orthopnea or PND. Occassional lower extremity edema with that improves with elevation. She reports occasional body aches but no recent fevers or illnesses. No nasal congestion or cough. No abnormal bleeding in urine or stools.  In the ED, hypertensive with BP as high as 189/97 and mildly tachypneic. EKG showed normal sinus rhythm, rate 82 bpm, with no acute ischemic changes. Chest x-ray showed no acute findings.  High-sensitivity troponin negative x2. WBC 8.1, Hgb 11.4, Plts 363. Na 136, K 3.6, Glucose 325, BUN 13, 0.66. Patient received Nitro in the ED with resolution of pain.  At the time of this evaluation, patient resting comfortably. Currently chest pain free.    Past Medical History:  Diagnosis Date  . Diabetes mellitus   . GERD (gastroesophageal reflux disease)   . Hyperlipemia   . Hypertension   . Stroke [facility])     Past Surgical History:  Procedure Laterality Date  . None       Medications Prior to Admission: Prior to Admission medications   Medication Sig Start Date End Date Taking? Authorizing Provider  acetaminophen (TYLENOL) 500 MG tablet Take 1 tablet (500 mg total) by mouth every 6 (six) hours as needed. 10/30/19   Avegno, Darrelyn Hillock, FNP  clopidogrel (PLAVIX) 75 MG tablet Take 1 tablet (75 mg total) by mouth daily. 12/27/18   Frann Rider, NP  ezetimibe (ZETIA) 10 MG tablet Take 1 tablet (10 mg total) by mouth daily. 12/28/18   Frann Rider, NP  Insulin Glargine (LANTUS [place]) Inject 30 Units into the skin at bedtime.    [provider]  Insulin Syringes, Disposable, U-100 0.5 ML MISC 30 Units by Does not apply route 2 (two) times daily with a meal. 08/20/18   Kathi Ludwig, MD  rosuvastatin (CRESTOR) 40 MG tablet Take 1 tablet (40 mg total) by mouth daily. 12/27/18   Frann Rider, NP  sertraline (ZOLOFT) 50 MG tablet Take 1 tablet (50 mg total) by mouth at bedtime. 12/27/18   Frann Rider, NP  metFORMIN (GLUCOPHAGE) 1000 MG tablet Take 1,000 mg by mouth 2 (two) times daily with a meal.  10/30/19  [provider]     Allergies:    Allergies  Allergen Reactions  . Other     Hives to an unknown medication given in 2012 at [place] History:   Social History   Socioeconomic History  . Marital status: Single    Spouse name: Not on file  . Number of children: 2  . Years of education: Not on file  . Highest education level: Not on file    Occupational History  . 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 Smoking status: Never Smoker  . Smokeless tobacco: Never Used  Vaping Use  . Vaping Use: Never used  Substance and Sexual Activity  . Alcohol use: No  . Drug use: No  . Sexual activity: Never  Other Topics Concern  . Not on file  Social History Narrative  . Not on file   Social Determinants of Health   Financial Resource Strain:   . Difficulty of Paying Living Expenses:   Food Insecurity:   . Worried About Charity fundraiser in the Last Year:   . Arboriculturist in the Last Year:   Transportation Needs:   . Film/video editor (Medical):   Marland Kitchen Lack of Transportation (Non-Medical):   Physical Activity:   . Days of Exercise per Week:   . Minutes of Exercise per Session:   Stress:   . Feeling of Stress :   Social Connections:   . Frequency of Communication with Friends and Family:   . Frequency of Social Gatherings with Friends and Family:   . Attends Religious Services:   . Active Member of Clubs or Organizations:   . Attends Archivist Meetings:   Marland Kitchen Marital Status:   Intimate Partner Violence:   . Fear of Current or Ex-Partner:   . Emotionally Abused:   Marland Kitchen Physically Abused:   . Sexually Abused:     Family History:   The patient's family history includes Breast cancer in her maternal grandmother and mother; Diabetes in her mother; Heart failure in her father; Hypertension in her father and mother; Stroke in her brother and mother. There is no history of Colon cancer, Stomach cancer, Rectal cancer, Esophageal cancer, or Liver cancer.    ROS:  Please see the history of present illness.  All other ROS reviewed and negative.     Physical Exam/Data:   Vitals:   07/06/20 1230 07/06/20 1300 07/06/20 1330 07/06/20 1400  BP: 138/80 (!) 144/78 138/75 (!) 147/72  Pulse: 76 73 81 77  Resp: (!) 24 12 15  (!) 27  Temp:      TempSrc:      SpO2: 98% 97% 96% 96%   No intake or output  data in the 24 hours ending 07/06/20 1439 Last 3 Weights 10/30/2019 12/27/2018 09/26/2018  Weight (lbs) 245 lb 240 lb 244 lb  Weight (kg) 111.131 kg 108.863 kg 110.678 kg     There is no height or weight on file to calculate BMI.  General: 54 y.o. morbidly obese African-American female resting comfortably in no acute distress. HEENT: Normocephalic and atraumatic. Sclera clear.  Neck: Supple. No carotid bruits. No JVD. Heart: RRR. Distinct S1 and S2. No murmurs, gallops, or rubs. Radial pulses 2+ and equal bilaterally. Lungs: No increased work of breathing. Clear to ausculation bilaterally. No wheezes, rhonchi,  or rales.  Abdomen: Soft, non-distended, and non-tender to palpation. Bowel sounds present. MSK: Normal strength and tone for age. Extremities: Mild bilateral lower extremity extremity edema. Skin: Warm and dry. Neuro: Alert and oriented x3. No focal deficits. Psych: Normal affect. Responds appropriately.    EKG:  The ECG that was done was personally reviewed and demonstrates normal sinus rhythm, rate 82 bpm, with no acute ischemic changes. Normal axis. Normal PR and QRS intervals. QTc 469 ms.  Telemetry: Telemetry personally reviewed and demonstrates normal sinus rhythm with rates in the 70's.  Relevant CV Studies:  Echocardiogram 08/19/2018: Study Conclusions: - Left ventricle: The cavity size was normal. Wall thickness was  increased in a pattern of mild LVH. Systolic function was normal.  The estimated ejection fraction was in the range of 55% to 60%.  Wall motion was normal; there were no regional wall motion  abnormalities.   Impressions:  - No cardiac source of emboli was indentified.   Laboratory Data:  High Sensitivity Troponin:   Recent Labs  Lab 07/06/20 0500 07/06/20 0616  TROPONINIHS 7 4      Chemistry Recent Labs  Lab 07/06/20 0500  NA 136  K 3.6  CL 99  CO2 25  GLUCOSE 325*  BUN 13  CREATININE 0.66  CALCIUM 8.8*  GFRNONAA >60  GFRAA  >60  ANIONGAP 12    No results for input(s): PROT, ALBUMIN, AST, ALT, ALKPHOS, BILITOT in the last 168 hours. Hematology Recent Labs  Lab 07/06/20 0500  WBC 8.1  RBC 3.94  HGB 11.4*  HCT 37.4  MCV 94.9  MCH 28.9  MCHC 30.5  RDW 13.2  PLT 363   BNPNo results for input(s): BNP, PROBNP in the last 168 hours.  DDimer No results for input(s): DDIMER in the last 168 hours.   Radiology/Studies:  DG Chest 2 View  Result Date: 07/06/2020 CLINICAL DATA:  Chest pain EXAM: CHEST - 2 VIEW COMPARISON:  06/20/2018 FINDINGS: Lungs are clear.  No pleural effusion or pneumothorax. The heart is normal in size. Mild degenerative changes of the visualized thoracolumbar spine. IMPRESSION: Normal chest radiographs. Electronically Signed   By: Julian Hy M.D.   On: 07/06/2020 04:40   TIMI Risk Score for Unstable Angina or Non-ST Elevation MI:   The patient's TIMI risk score is 3, which indicates a 13% risk of all cause mortality, new or recurrent myocardial infarction or need for urgent revascularization in the next 14 days.    Assessment and Plan:   Unstable Angina - Patient presents with chest pain concerning for unstable angina. - EKG shows no acute ischemic changes. - High-sensitivity troponin negative x2.  - Currently chest pain free after receiving Nitro in the ED. - Will check Echo. - Will check fasting lipid panel and hemoglobin A1c. - Will start IV Heparin. - Patient on Aspirin and Plavix at home following stroke in 2019. Will hold Plavix for now given uncontrolled diabetes in case patient is found to have multivessel disease requiring CABG.  - Continue statin and Zetia. - Will start Toprol-XL 25mg  daily. - Given presentation and multiple cardiac risk factors, will plan for cardiac catheterization tomorrow. The patient understands that risks include but are not limited to stroke (1 in 1000), death (1 in 26), kidney failure [usually temporary] (1 in 500), bleeding (1 in 200),  allergic reaction [possibly serious] (1 in 200), and agrees to proceed.   Hypertension - BP 189/95 on presentaiton. Improved but still mildly elevated although patient has not  received any of home medications today.  - Continue home Fosinopril-HCTZ.   Hyperlipidemia - Will check fasting lipid panel.  - Continue home Crestor 40mg  daily and Zetia 10mg  daily.  Uncontrolled Type 2 Diabetes Mellitus - Most recent hemoglobin A1c reportedly 12%. Will recheck here. - On Farxiga and Insulin at home. Will start sliding scale insulin here.  - May need to consult Diabetes Coordinator for assistance.  Obstructive Sleep Apnea - Continue CPAP.  Prior Stroke - History of stroke in 08/2018. - On DAPT with Aspirin and Plavix. Will hold Plavix for now in case she is found to have multivessel disease requiring CABG.  Severity of Illness: The appropriate patient status for this patient is INPATIENT. Inpatient status is judged to be reasonable and necessary in order to provide the required intensity of service to ensure the patient's safety. The patient's presenting symptoms, physical exam findings, and initial radiographic and laboratory data in the context of their chronic comorbidities is felt to place them at high risk for further clinical deterioration. Furthermore, it is not anticipated that the patient will be medically stable for discharge from the hospital within 2 midnights of admission. The following factors support the patient status of inpatient.   " The patient's presenting symptoms include chest pain concerning for unstable angina. " The worrisome physical exam findings as above. " The initial radiographic and laboratory data unremarkable. " The chronic co-morbidities include HTN, HLD, DM, stroke.   * I certify that at the point of admission it is my clinical judgment that the patient will require inpatient hospital care spanning beyond 2 midnights from the point of admission due to high  intensity of service, high risk for further deterioration and high frequency of surveillance required.*    For questions or updates, please contact [place] Please consult [email or web address] for contact info under     Signed, Eppie Gibson  07/06/2020 2:39 PM   Patient seen and examined with Sande Rives PA-C.  Agree as above, with the following exceptions and changes as noted below. She is currently chest pain free. 54 yo female with DM2 and family history of premature CAD, HTN, HLD and prior stroke. Symptoms of unstable angina at home. ECG nonischemic, troponin not elevated.  Gen: NAD, CV: RRR, no murmurs, Lungs: clear, Abd: soft, Extrem: Warm, well perfused, no edema, Neuro/Psych: alert and oriented x 3, normal mood and affect. All available labs, radiology testing, previous records reviewed. Patient's body habitus may render noninvasive stress assessment unreliable. With her risk factors she is high risk for ischemia. Discussed this in detail with patient, determined cardiac cath would be next best step.  Plan for tomorrow.  INFORMED CONSENT: I have reviewed the risks, indications, and alternatives to cardiac catheterization, possible angioplasty, and stenting with the patient. Risks include but are not limited to bleeding, infection, vascular injury, stroke, myocardial infection, arrhythmia, kidney injury, radiation-related injury in the case of prolonged fluoroscopy use, emergency cardiac surgery, and death. The patient understands the risks of serious complication is 1-2 in 7124 with diagnostic cardiac cath and 1-2% or less with angioplasty/stenting.   Elouise Munroe, MD 07/06/20 4:25 PM

## 2020-07-06 NOTE — Progress Notes (Signed)
Patient received from ER. Heparin running at 950 units an hour.  Patient oriented to unit and daughter brought to the bedside. Change of visiting hours effective tomorrow clarified for the family.  Plan or care and expected Heart Catheterization tomorrow during the day.

## 2020-07-06 NOTE — Progress Notes (Signed)
ANTICOAGULATION CONSULT NOTE - Initial Consult  Pharmacy Consult for heparin dosing Indication: chest pain/ACS  Allergies  Allergen Reactions  . Other     Hives to an unknown medication given in 2012 at [facility]     Patient Measurements: Height: 5\' 3"  (160 cm) (Per patient) Weight: 111.1 kg (245 lb) (Per Patient) IBW/kg (Calculated) : 52.4 Heparin Dosing Weight: 79.2 kg   Vital Signs: Temp: 97.6 F (36.4 C) (08/08 0935) Temp Source: Oral (08/08 0935) BP: 147/72 (08/08 1400) Pulse Rate: 77 (08/08 1400)  Labs: Recent Labs    07/06/20 0500 07/06/20 0616  HGB 11.4*  --   HCT 37.4  --   PLT 363  --   CREATININE 0.66  --   TROPONINIHS 7 4    Estimated Creatinine Clearance: 95.2 mL/min (by C-G formula based on SCr of 0.66 mg/dL).   Medical History: Past Medical History:  Diagnosis Date  . Diabetes mellitus   . GERD (gastroesophageal reflux disease)   . Hyperlipemia   . Hypertension   . Stroke [facility])    Assessment: 54 y.o. F with a hx of stroke, HTN, HLD, DM, GERD, and obesity. Presented to ED 8/8 with chest pain raiding down the left side of her neck and arm. On DAPT but not AC PTA. Chest x-ray showed no acute findings. WBC 8.1, Hgb 11.4, Plts 363. Plans for cardiac cath tomorrow (8/9).   Goal of Therapy:  Heparin level 0.3-0.7 units/ml Monitor platelets by anticoagulation protocol: Yes   Plan:  Give heparin 4000 units bolus x 1 Start heparin infusion at 950 units/hr Check anti-Xa level in 6 hours and daily while on heparin Continue to monitor H&H and platelets F/u cardiac cath plans tomorrow (8/9)   Claudina Lick, PharmD PGY1 [place] Resident  07/06/2020 4:22 PM  Please check [email or web address] for unit-specific pharmacy phone numbers.

## 2020-07-06 NOTE — ED Notes (Signed)
PT up to [facility]

## 2020-07-06 NOTE — ED Triage Notes (Signed)
Pt is here with chest pain that radiates to left arm for the last 30 minutes.

## 2020-07-07 ENCOUNTER — Inpatient Hospital Stay (HOSPITAL_COMMUNITY): Payer: 59

## 2020-07-07 ENCOUNTER — Inpatient Hospital Stay (HOSPITAL_COMMUNITY)
Admission: EM | Disposition: A | Discharge status: Home / Self Care | Payer: Self-pay | Source: Home / Self Care | Attending: Internal Medicine

## 2020-07-07 ENCOUNTER — Other Ambulatory Visit (HOSPITAL_COMMUNITY): Payer: 59

## 2020-07-07 ENCOUNTER — Encounter (HOSPITAL_COMMUNITY): Payer: Self-pay | Admitting: Cardiovascular Disease

## 2020-07-07 DIAGNOSIS — I25110 Atherosclerotic heart disease of native coronary artery with unstable angina pectoris: Principal | ICD-10-CM

## 2020-07-07 DIAGNOSIS — R079 Chest pain, unspecified: Secondary | ICD-10-CM

## 2020-07-07 DIAGNOSIS — I10 Essential (primary) hypertension: Secondary | ICD-10-CM

## 2020-07-07 DIAGNOSIS — I200 Unstable angina: Secondary | ICD-10-CM

## 2020-07-07 DIAGNOSIS — E785 Hyperlipidemia, unspecified: Secondary | ICD-10-CM

## 2020-07-07 HISTORY — PX: LEFT HEART CATH AND CORONARY ANGIOGRAPHY: CATH118249

## 2020-07-07 LAB — BASIC METABOLIC PANEL
Anion gap: 8 (ref 5–15)
BUN: 9 mg/dL (ref 6–20)
CO2: 25 mmol/L (ref 22–32)
Calcium: 8.4 mg/dL — ABNORMAL LOW (ref 8.9–10.3)
Chloride: 104 mmol/L (ref 98–111)
Creatinine, Ser: 0.65 mg/dL (ref 0.44–1.00)
GFR calc Af Amer: >60 mL/min (ref >60)
GFR calc non Af Amer: >60 mL/min (ref >60)
Glucose, Bld: 276 mg/dL — ABNORMAL HIGH (ref 70–99)
Potassium: 3.4 mmol/L — ABNORMAL LOW (ref 3.5–5.1)
Sodium: 137 mmol/L (ref 135–145)

## 2020-07-07 LAB — GLUCOSE, CAPILLARY
Glucose-Capillary: 308 mg/dL — ABNORMAL HIGH (ref 70–99)
Glucose-Capillary: 184 mg/dL — ABNORMAL HIGH (ref 70–99)
Glucose-Capillary: 231 mg/dL — ABNORMAL HIGH (ref 70–99)
Glucose-Capillary: 264 mg/dL — ABNORMAL HIGH (ref 70–99)

## 2020-07-07 LAB — CBC
HCT: 34.2 % — ABNORMAL LOW (ref 36.0–46.0)
Hemoglobin: 10.7 g/dL — ABNORMAL LOW (ref 12.0–15.0)
MCH: 29.2 pg (ref 26.0–34.0)
MCHC: 31.3 g/dL (ref 30.0–36.0)
MCV: 93.2 fL (ref 80.0–100.0)
Platelets: 329 10*3/uL (ref 150–400)
RBC: 3.67 MIL/uL — ABNORMAL LOW (ref 3.87–5.11)
RDW: 13.2 % (ref 11.5–15.5)
WBC: 9 10*3/uL (ref 4.0–10.5)
nRBC: 0 % (ref 0.0–0.2)

## 2020-07-07 LAB — LIPID PANEL
Cholesterol: 219 mg/dL — ABNORMAL HIGH (ref 0–200)
HDL: 36 mg/dL — ABNORMAL LOW (ref >40)
LDL Cholesterol: 153 mg/dL — ABNORMAL HIGH (ref 0–99)
Total CHOL/HDL Ratio: 6.1 RATIO
Triglycerides: 150 mg/dL — ABNORMAL HIGH (ref <150)
VLDL: 30 mg/dL (ref 0–40)

## 2020-07-07 LAB — ECHOCARDIOGRAM COMPLETE
Area-P 1/2: 3.31 cm2
Height: 63 in
S' Lateral: 2.6 cm
Weight: 4003.2 oz

## 2020-07-07 LAB — HEPARIN LEVEL (UNFRACTIONATED)
Heparin Unfractionated: <0.1 IU/mL — ABNORMAL LOW (ref 0.30–0.70)
Heparin Unfractionated: 0.19 IU/mL — ABNORMAL LOW (ref 0.30–0.70)

## 2020-07-07 SURGERY — LEFT HEART CATH AND CORONARY ANGIOGRAPHY
Anesthesia: LOCAL

## 2020-07-07 MED ORDER — CLOPIDOGREL BISULFATE 300 MG PO TABS
ORAL_TABLET | ORAL | Status: DC | PRN
  Administered 2020-07-07: 600 mg via ORAL

## 2020-07-07 MED ORDER — POTASSIUM CHLORIDE CRYS ER 20 MEQ PO TBCR
40.0000 meq | EXTENDED_RELEASE_TABLET | Freq: Once | ORAL | Status: AC
  Administered 2020-07-07: 40 meq via ORAL
  Filled 2020-07-07: qty 2

## 2020-07-07 MED ORDER — ATORVASTATIN CALCIUM 80 MG PO TABS
80.0000 mg | ORAL_TABLET | Freq: Every day | ORAL | Status: DC
  Administered 2020-07-07 – 2020-07-09 (×2): 80 mg via ORAL
  Filled 2020-07-07 (×2): qty 1

## 2020-07-07 MED ORDER — LABETALOL HCL 5 MG/ML IV SOLN: 10.0000 mg | INTRAVENOUS | Status: AC | PRN

## 2020-07-07 MED ORDER — HEPARIN SODIUM (PORCINE) 1000 UNIT/ML IJ SOLN
INTRAMUSCULAR | Status: DC | PRN
  Administered 2020-07-07: 6000 [IU] via INTRAVENOUS

## 2020-07-07 MED ORDER — SODIUM CHLORIDE 0.9 % IV SOLN: 250.0000 mL | INTRAVENOUS | Status: DC | PRN

## 2020-07-07 MED ORDER — HEPARIN (PORCINE) IN NACL 1000-0.9 UT/500ML-% IV SOLN
INTRAVENOUS | Status: AC
  Filled 2020-07-07: qty 1000

## 2020-07-07 MED ORDER — SODIUM CHLORIDE 0.9% FLUSH: 3.0000 mL | INTRAVENOUS | Status: DC | PRN

## 2020-07-07 MED ORDER — FAMOTIDINE IN NACL 20-0.9 MG/50ML-% IV SOLN
INTRAVENOUS | Status: AC | PRN
  Administered 2020-07-07: 20 mg via INTRAVENOUS

## 2020-07-07 MED ORDER — HEPARIN (PORCINE) IN NACL 1000-0.9 UT/500ML-% IV SOLN
INTRAVENOUS | Status: DC | PRN
  Administered 2020-07-07 (×2): 500 mL

## 2020-07-07 MED ORDER — SODIUM CHLORIDE 0.9 % IV SOLN: INTRAVENOUS | Status: AC

## 2020-07-07 MED ORDER — HEPARIN (PORCINE) 25000 UT/250ML-% IV SOLN
1700.0000 [IU]/h | INTRAVENOUS | Status: DC
  Administered 2020-07-07: 1400 [IU]/h via INTRAVENOUS
  Administered 2020-07-08: 1700 [IU]/h via INTRAVENOUS
  Filled 2020-07-07 (×2): qty 250

## 2020-07-07 MED ORDER — IOHEXOL 350 MG/ML SOLN
INTRAVENOUS | Status: DC | PRN
  Administered 2020-07-07: 180 mL

## 2020-07-07 MED ORDER — FENTANYL CITRATE (PF) 100 MCG/2ML IJ SOLN
INTRAMUSCULAR | Status: DC | PRN
  Administered 2020-07-07: 25 ug via INTRAVENOUS

## 2020-07-07 MED ORDER — VERAPAMIL HCL 2.5 MG/ML IV SOLN
INTRAVENOUS | Status: AC
  Filled 2020-07-07: qty 2

## 2020-07-07 MED ORDER — SODIUM CHLORIDE 0.9% FLUSH
3.0000 mL | Freq: Two times a day (BID) | INTRAVENOUS | Status: DC
  Administered 2020-07-07 – 2020-07-08 (×3): 3 mL via INTRAVENOUS

## 2020-07-07 MED ORDER — FAMOTIDINE IN NACL 20-0.9 MG/50ML-% IV SOLN
INTRAVENOUS | Status: AC
  Filled 2020-07-07: qty 50

## 2020-07-07 MED ORDER — MORPHINE SULFATE (PF) 2 MG/ML IV SOLN
2.0000 mg | INTRAVENOUS | Status: DC | PRN
  Administered 2020-07-08 (×2): 2 mg via INTRAVENOUS

## 2020-07-07 MED ORDER — ASPIRIN 81 MG PO CHEW
81.0000 mg | CHEWABLE_TABLET | Freq: Every day | ORAL | Status: DC
  Administered 2020-07-07 – 2020-07-09 (×2): 81 mg via ORAL
  Filled 2020-07-07 (×2): qty 1

## 2020-07-07 MED ORDER — VERAPAMIL HCL 2.5 MG/ML IV SOLN
INTRA_ARTERIAL | Status: DC | PRN
  Administered 2020-07-07: 15 mL via INTRA_ARTERIAL

## 2020-07-07 MED ORDER — LIDOCAINE HCL (PF) 1 % IJ SOLN
INTRAMUSCULAR | Status: AC
  Filled 2020-07-07: qty 30

## 2020-07-07 MED ORDER — MIDAZOLAM HCL 2 MG/2ML IJ SOLN
INTRAMUSCULAR | Status: DC | PRN
  Administered 2020-07-07: 1 mg via INTRAVENOUS

## 2020-07-07 MED ORDER — LIDOCAINE HCL (PF) 1 % IJ SOLN
INTRAMUSCULAR | Status: DC | PRN
  Administered 2020-07-07: 2 mL

## 2020-07-07 MED ORDER — MIDAZOLAM HCL 2 MG/2ML IJ SOLN
INTRAMUSCULAR | Status: AC
  Filled 2020-07-07: qty 2

## 2020-07-07 MED ORDER — CLOPIDOGREL BISULFATE 300 MG PO TABS
ORAL_TABLET | ORAL | Status: AC
  Filled 2020-07-07: qty 2

## 2020-07-07 MED ORDER — NITROGLYCERIN 1 MG/10 ML FOR IR/CATH LAB
INTRA_ARTERIAL | Status: AC
  Filled 2020-07-07: qty 10

## 2020-07-07 MED ORDER — CLOPIDOGREL BISULFATE 75 MG PO TABS: 75.0000 mg | ORAL_TABLET | Freq: Every day | ORAL | Status: DC

## 2020-07-07 MED ORDER — LIVING WELL WITH DIABETES BOOK
Freq: Once | Status: AC
  Filled 2020-07-07: qty 1

## 2020-07-07 MED ORDER — CLOPIDOGREL BISULFATE 75 MG PO TABS
75.0000 mg | ORAL_TABLET | Freq: Every day | ORAL | Status: DC
  Administered 2020-07-08 – 2020-07-09 (×2): 75 mg via ORAL
  Filled 2020-07-07 (×2): qty 1

## 2020-07-07 MED ORDER — ACETAMINOPHEN 325 MG PO TABS
650.0000 mg | ORAL_TABLET | ORAL | Status: DC | PRN
  Administered 2020-07-07: 650 mg via ORAL

## 2020-07-07 MED ORDER — HEPARIN SODIUM (PORCINE) 1000 UNIT/ML IJ SOLN
INTRAMUSCULAR | Status: AC
  Filled 2020-07-07: qty 1

## 2020-07-07 MED ORDER — HYDRALAZINE HCL 20 MG/ML IJ SOLN: 10.0000 mg | INTRAMUSCULAR | Status: AC | PRN

## 2020-07-07 MED ORDER — ONDANSETRON HCL 4 MG/2ML IJ SOLN
4.0000 mg | Freq: Four times a day (QID) | INTRAMUSCULAR | Status: DC | PRN
  Administered 2020-07-07 – 2020-07-09 (×2): 4 mg via INTRAVENOUS
  Filled 2020-07-07 (×2): qty 2

## 2020-07-07 MED ORDER — FENTANYL CITRATE (PF) 100 MCG/2ML IJ SOLN
INTRAMUSCULAR | Status: AC
  Filled 2020-07-07: qty 2

## 2020-07-07 MED ORDER — HEPARIN BOLUS VIA INFUSION
3000.0000 [IU] | Freq: Once | INTRAVENOUS | Status: DC
  Filled 2020-07-07: qty 3000

## 2020-07-07 SURGICAL SUPPLY — 14 items
CATH INFINITI JR4 5F (CATHETERS) ×1 IMPLANT
CATH LAUNCHER 5F JR4 (CATHETERS) ×1 IMPLANT
CATH LAUNCHER 5F NOTO (CATHETERS) IMPLANT
CATH OPTITORQUE TIG 4.0 5F (CATHETERS) ×1 IMPLANT
CATHETER LAUNCHER 5F NOTO (CATHETERS) ×2
DEVICE RAD COMP TR BAND LRG (VASCULAR PRODUCTS) ×1 IMPLANT
GLIDESHEATH SLEND A-KIT 6F 22G (SHEATH) ×1 IMPLANT
GUIDEWIRE INQWIRE 1.5J.035X260 (WIRE) IMPLANT
INQWIRE 1.5J .035X260CM (WIRE) ×2
KIT HEART LEFT (KITS) ×2 IMPLANT
PACK CARDIAC CATHETERIZATION (CUSTOM PROCEDURE TRAY) ×2 IMPLANT
TRANSDUCER W/STOPCOCK (MISCELLANEOUS) ×2 IMPLANT
TUBING CIL FLEX 10 FLL-RA (TUBING) ×2 IMPLANT
WIRE HI TORQ VERSACORE-J 145CM (WIRE) ×1 IMPLANT

## 2020-07-07 NOTE — Progress Notes (Signed)
ANTICOAGULATION CONSULT NOTE - Follow Up Consult  Pharmacy Consult for Heparin Indication: chest pain/ACS  Allergies  Allergen Reactions  . Other     Hives to an unknown medication given in 2012 at [facility]     Patient Measurements: Height: 5\' 3"  (160 cm) (Per patient) Weight: 113.5 kg (250 lb 3.2 oz) IBW/kg (Calculated) : 52.4 Heparin Dosing Weight: HEPARIN DW (KG): 79.2   Vital Signs: Temp: 98.2 F (36.8 C) (08/09 1955) Temp Source: Oral (08/09 1955) BP: 144/86 (08/09 1955) Pulse Rate: 75 (08/09 1955)  Labs: Recent Labs    07/06/20 0500 07/06/20 0616 07/06/20 2245 07/07/20 0118 07/07/20 0515 07/07/20 2047  HGB 11.4*  --   --  10.7*  --   --   HCT 37.4  --   --  34.2*  --   --   PLT 363  --   --  329  --   --   HEPARINUNFRC  --   --  <0.10*  --  <0.10* 0.19*  CREATININE 0.66  --   --  0.65  --   --   TROPONINIHS 7 4  --   --   --   --     Estimated Creatinine Clearance: 96.3 mL/min (by C-G formula based on SCr of 0.65 mg/dL).   Medications:  Scheduled:  . aspirin  81 mg Oral Daily  . atorvastatin  80 mg Oral Daily  . [START ON 07/08/2020] clopidogrel  75 mg Oral Q breakfast  . ezetimibe  10 mg Oral Daily  . lisinopril  20 mg Oral Daily   And  . hydrochlorothiazide  12.5 mg Oral Daily  . insulin aspart  0-20 Units Subcutaneous TID WC  . metoprolol succinate  25 mg Oral Daily  . sertraline  50 mg Oral QHS  . sodium chloride flush  3 mL Intravenous Once  . sodium chloride flush  3 mL Intravenous Q12H   Infusions:  . sodium chloride    . heparin 1,400 Units/hr (07/07/20 1259)    Assessment: 54 yo F resumed on heparin s/p cardiac cath with plans for possible PCI.  Heparin level is subtherapeutic on 1400 units/hr.  No IV issues or bleeding noted.  Goal of Therapy:  Heparin level 0.3-0.7 units/ml Monitor platelets by anticoagulation protocol: Yes   Plan:  Increase heparin to 1550 units/hr Next heparin level in 6 hours.  [employer],  Pharm.D., BCPS Clinical Pharmacist  **Pharmacist phone directory can be found on [email or web address] listed under [place].  07/07/2020 10:03 PM

## 2020-07-07 NOTE — Plan of Care (Signed)
  Problem: Education: Goal: Knowledge of General Education information will improve Description Including pain rating scale, medication(s)/side effects and non-pharmacologic comfort measures Outcome: Progressing   

## 2020-07-07 NOTE — Plan of Care (Signed)

## 2020-07-07 NOTE — Progress Notes (Signed)
ANTICOAGULATION CONSULT NOTE - Initial Consult  Pharmacy Consult for Heparin restart 4 hours post-sheath removal Indication: chest pain/ACS  Allergies  Allergen Reactions  . Other     Hives to an unknown medication given in 2012 at [facility]     Patient Measurements: Height: 5\' 3"  (160 cm) (Per patient) Weight: 113.5 kg (250 lb 3.2 oz) IBW/kg (Calculated) : 52.4 Heparin Dosing Weight: 80 kg  Vital Signs: Temp: 97.8 F (36.6 C) (08/09 0917) Temp Source: Oral (08/09 0917) BP: 149/71 (08/09 0917) Pulse Rate: 71 (08/09 0917)  Labs: Recent Labs    07/06/20 0500 07/06/20 0616 07/06/20 2245 07/07/20 0118 07/07/20 0515  HGB 11.4*  --   --  10.7*  --   HCT 37.4  --   --  34.2*  --   PLT 363  --   --  329  --   HEPARINUNFRC  --   --  <0.10*  --  <0.10*  CREATININE 0.66  --   --  0.65  --   TROPONINIHS 7 4  --   --   --     Estimated Creatinine Clearance: 96.3 mL/min (by C-G formula based on SCr of 0.65 mg/dL).   Medical History: Past Medical History:  Diagnosis Date  . Diabetes mellitus   . GERD (gastroesophageal reflux disease)   . Hyperlipemia   . Hypertension   . Stroke [facility])     Assessment: Patient is 54 years of age female who was admitted with chest pain and initiated on IV Heparin therapy. Patient is now post cardiac catheterization and pharmacy has been consulted to resume IV Heparin therapy 4 hours post sheath removal for plan for stenting.   Prior to cath, heparin was not therapeutic and rate was increased at that time to 1600 units/hr. Heparin was stopped for cath. Patient was given at 6000 unit bolus in procedure. Sheath was removed at 8:34 AM and TR band was placed with 10 cc of air - RN titration down on air in TR band proceeding without issues. No bleeding noted.   Goal of Therapy:  Heparin level 0.3-0.7 units/ml Monitor platelets by anticoagulation protocol: Yes   Plan:  Resume Heparin at lower rate due to recent procedure of 1400 units/hr - start  time 1230PM.  Recheck Heparin level in 6 hours.  Daily Heparin level and CBC while on therapy  Sloan Leiter, PharmD, BCPS, BCCCP Clinical Pharmacist Please refer to [facility] for [place] numbers 07/07/2020,9:28 AM

## 2020-07-07 NOTE — H&P (View-Only) (Signed)
Progress Note  Patient Name: Cherrie Franca Date of Encounter: 07/07/2020  Primary Cardiologist: Elouise Munroe, MD   Subjective   Patient seen after cath. Doing well. Daughter in room.   Inpatient Medications    Scheduled Meds:  aspirin  81 mg Oral Daily   atorvastatin  80 mg Oral Daily   [START ON 07/08/2020] clopidogrel  75 mg Oral Q breakfast   ezetimibe  10 mg Oral Daily   lisinopril  20 mg Oral Daily   And   hydrochlorothiazide  12.5 mg Oral Daily   insulin aspart  0-20 Units Subcutaneous TID WC   metoprolol succinate  25 mg Oral Daily   sertraline  50 mg Oral QHS   sodium chloride flush  3 mL Intravenous Once   sodium chloride flush  3 mL Intravenous Q12H   Continuous Infusions:  sodium chloride 75 mL/hr at 07/07/20 1024   sodium chloride     heparin     PRN Meds: sodium chloride, acetaminophen, hydrALAZINE, labetalol, morphine injection, nitroGLYCERIN, ondansetron (ZOFRAN) IV, sodium chloride flush   Vital Signs    Vitals:   07/07/20 0917 07/07/20 0938 07/07/20 1019 07/07/20 1138  BP: (!) 149/71 (!) 152/66 139/76 (!) 144/68  Pulse: 71 70 68 71  Resp:    17  Temp: 97.8 F (36.6 C)   97.7 F (36.5 C)  TempSrc: Oral   Oral  SpO2: 93%   96%  Weight:      Height:        Intake/Output Summary (Last 24 hours) at 07/07/2020 1155 Last data filed at 07/07/2020 1030 Gross per 24 hour  Intake 711.81 ml  Output 250 ml  Net 461.81 ml   Filed Weights   07/06/20 1600 07/07/20 0154  Weight: 111.1 kg 113.5 kg    Telemetry    SR - Personally Reviewed  ECG    SR - Personally Reviewed  Physical Exam   GEN: No acute distress.   Neck: No JVD Cardiac: regular rhythm, normal rate, no murmurs, rubs, or gallops. Radial site dressed. Respiratory: Clear to auscultation bilaterally. GI: Soft, nontender, non-distended  MS: No edema; No deformity. Neuro:  Nonfocal  Psych: Normal affect   Labs    Chemistry Recent Labs  Lab 07/06/20 0500  07/07/20 0118  NA 136 137  K 3.6 3.4*  CL 99 104  CO2 25 25  GLUCOSE 325* 276*  BUN 13 9  CREATININE 0.66 0.65  CALCIUM 8.8* 8.4*  GFRNONAA >60 >60  GFRAA >60 >60  ANIONGAP 12 8     Hematology Recent Labs  Lab 07/06/20 0500 07/07/20 0118  WBC 8.1 9.0  RBC 3.94 3.67*  HGB 11.4* 10.7*  HCT 37.4 34.2*  MCV 94.9 93.2  MCH 28.9 29.2  MCHC 30.5 31.3  RDW 13.2 13.2  PLT 363 329    Cardiac EnzymesNo results for input(s): TROPONINI in the last 168 hours. No results for input(s): TROPIPOC in the last 168 hours.   BNPNo results for input(s): BNP, PROBNP in the last 168 hours.   DDimer No results for input(s): DDIMER in the last 168 hours.   Radiology    DG Chest 2 View  Result Date: 07/06/2020 CLINICAL DATA:  Chest pain EXAM: CHEST - 2 VIEW COMPARISON:  06/20/2018 FINDINGS: Lungs are clear.  No pleural effusion or pneumothorax. The heart is normal in size. Mild degenerative changes of the visualized thoracolumbar spine. IMPRESSION: Normal chest radiographs. Electronically Signed   By: Julian Hy  M.D.   On: 07/06/2020 04:40   CARDIAC CATHETERIZATION  Result Date: 07/07/2020  Prox RCA lesion is 60% stenosed.  Mid RCA to Dist RCA lesion is 80% stenosed.  Prox LAD to Mid LAD lesion is 80% stenosed.  Dist LAD lesion is 80% stenosed.  Milagros Middendorf is a 54 y.o. female  [id number] LOCATION:  FACILITY: [facility] PHYSICIAN: Quay Burow, M.D. [birth year] DATE OF PROCEDURE:  07/07/2020 DATE OF DISCHARGE: CARDIAC CATHETERIZATION History obtained from chart review.Lowen Mansouri is a 54 y.o. female with a history of stroke, obstructive sleep apnea on CPAP, hypertension, hyperlipidemia, diabetes mellitus, GERD, and morbid obesity who presented to the ED with chest pain.  She ruled out for myocardial infarction.  Her EKG showed no acute changes.  She was heparinized and had no recurrent chest pain.  She does have a family history of heart disease.  She presents now for diagnostic  coronary angiography.    Ms. 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  Her LVEDP was normal.  Her LV appeared normal with hand-injection although 2D echo will be obtained.  I used 180 cc of contrast to do the diagnostic because of extreme tortuosity in the brachiocephalic system.  I do not think intervention could be performed safely radially and I suspect she will need femoral access.  In addition, I did use 20 minutes of fluoroscopy time because of her large stature (112 kg).  I suspect the proximal LAD lesion would optimally be treated with orbital atherectomy for debulking prior to stenting although there is some tortuosity in the proximal ID which may make this technically challenging.  I did take the liberty of loading her with Plavix.  Her cardiac enzymes were negative.  I am going to reheparinize her.  The sheath was removed and a TR band was placed on the right wrist to achieve patent hemostasis.  The patient left the lab in stable condition. Quay Burow. MD, [facility] 07/07/2020 8:40 AM    Cardiac Studies   Echo pending.  Cath showed   Prox RCA lesion is 60% stenosed.  Mid RCA to Dist RCA lesion is 80% stenosed.  Prox LAD to Mid LAD lesion is 80% stenosed.  Dist LAD lesion is 80% stenosed.  Recommendation for possible complex PCI. IC to review as team.    Patient Profile     Ebany Bowermaster is a 54 y.o. female with a history of stroke, obstructive sleep apnea on CPAP, hypertension, hyperlipidemia, diabetes mellitus, GERD, and morbid obesity who presents to the ED with chest pain.  Assessment & Plan   Active Problems:   Unstable angina [facility])   Discussed in detail with patient and daughter.   Unstable Angina Cath shows RCA and LAD disease. Complex vascular anatomy from radial approach. Will need consideration of femoral access complex PCI.  Has been plavix loaded by Dr. Gwenlyn Found but may need consideration for bypass as well, will review with IC team. - Patient presents with chest pain concerning for unstable angina. - EKG shows no acute ischemic changes. - High-sensitivity troponin negative x2.  - Will check Echo. - Patient on Aspirin and Plavix at home following stroke in 2019.  - Continue statin and Zetia. - Toprol-XL 25mg  daily.  Hypertension - BP 189/95 on presentaiton. Improved but still mildly elevated although patient has not received any of home medications today.  - Continue home Fosinopril-HCTZ. -  formulary med is lisinopril.  - continue metoprolol.  - may need additional BP control with amlodipine considering dye load today, will avoid uptitration of ACE-I currently.  Hyperlipidemia - Continue home Crestor 40mg  daily and Zetia 10mg  daily. - lipids are not optimized on therapy, may need to visit with CVRR if compliant with meds for discussion of PCSK9I.LDL 153.  Uncontrolled Type 2 Diabetes Mellitus - Most recent hemoglobin A1c reportedly 12%. Will recheck here. - On Farxiga and Insulin at home. Will start sliding scale insulin here.  - May need to consult Diabetes Coordinator for assistance.  Obstructive Sleep Apnea - Continue CPAP.  Prior Stroke - History of stroke in 08/2018. - On DAPT with Aspirin and Plavix. Will hold Plavix for now in case she is found to have multivessel disease requiring CABG.    For questions or updates, please contact [place] Please consult [email or web address] for contact info under        Signed, Elouise Munroe, MD  07/07/2020, 11:55 AM

## 2020-07-07 NOTE — Progress Notes (Signed)
Progress Note  Patient Name: Cindy [name] Date of Encounter: 07/07/2020  Primary Cardiologist: Elouise Munroe, MD   Subjective   Patient seen after cath. Doing well. Daughter in room.   Inpatient Medications    Scheduled Meds: . aspirin  81 mg Oral Daily  . atorvastatin  80 mg Oral Daily  . [START ON 07/08/2020] clopidogrel  75 mg Oral Q breakfast  . ezetimibe  10 mg Oral Daily  . lisinopril  20 mg Oral Daily   And  . hydrochlorothiazide  12.5 mg Oral Daily  . insulin aspart  0-20 Units Subcutaneous TID WC  . metoprolol succinate  25 mg Oral Daily  . sertraline  50 mg Oral QHS  . sodium chloride flush  3 mL Intravenous Once  . sodium chloride flush  3 mL Intravenous Q12H   Continuous Infusions: . sodium chloride 75 mL/hr at 07/07/20 1024  . sodium chloride    . heparin     PRN Meds: sodium chloride, acetaminophen, hydrALAZINE, labetalol, morphine injection, nitroGLYCERIN, ondansetron (ZOFRAN) IV, sodium chloride flush   Vital Signs    Vitals:   07/07/20 0917 07/07/20 0938 07/07/20 1019 07/07/20 1138  BP: (!) 149/71 (!) 152/66 139/76 (!) 144/68  Pulse: 71 70 68 71  Resp:    17  Temp: 97.8 F (36.6 C)   97.7 F (36.5 C)  TempSrc: Oral   Oral  SpO2: 93%   96%  Weight:      Height:        Intake/Output Summary (Last 24 hours) at 07/07/2020 1155 Last data filed at 07/07/2020 1030 Gross per 24 hour  Intake 711.81 ml  Output 250 ml  Net 461.81 ml   Filed Weights   07/06/20 1600 07/07/20 0154  Weight: 111.1 kg 113.5 kg    Telemetry    SR - Personally Reviewed  ECG    SR - Personally Reviewed  Physical Exam   GEN: No acute distress.   Neck: No JVD Cardiac: regular rhythm, normal rate, no murmurs, rubs, or gallops. Radial site dressed. Respiratory: Clear to auscultation bilaterally. GI: Soft, nontender, non-distended  MS: No edema; No deformity. Neuro:  Nonfocal  Psych: Normal affect   Labs    Chemistry Recent Labs  Lab 07/06/20 0500  07/07/20 0118  NA 136 137  K 3.6 3.4*  CL 99 104  CO2 25 25  GLUCOSE 325* 276*  BUN 13 9  CREATININE 0.66 0.65  CALCIUM 8.8* 8.4*  GFRNONAA >60 >60  GFRAA >60 >60  ANIONGAP 12 8     Hematology Recent Labs  Lab 07/06/20 0500 07/07/20 0118  WBC 8.1 9.0  RBC 3.94 3.67*  HGB 11.4* 10.7*  HCT 37.4 34.2*  MCV 94.9 93.2  MCH 28.9 29.2  MCHC 30.5 31.3  RDW 13.2 13.2  PLT 363 329    Cardiac EnzymesNo results for input(s): TROPONINI in the last 168 hours. No results for input(s): TROPIPOC in the last 168 hours.   BNPNo results for input(s): BNP, PROBNP in the last 168 hours.   DDimer No results for input(s): DDIMER in the last 168 hours.   Radiology    DG Chest 2 View  Result Date: 07/06/2020 CLINICAL DATA:  Chest pain EXAM: CHEST - 2 VIEW COMPARISON:  06/20/2018 FINDINGS: Lungs are clear.  No pleural effusion or pneumothorax. The heart is normal in size. Mild degenerative changes of the visualized thoracolumbar spine. IMPRESSION: Normal chest radiographs. Electronically Signed   By: Julian Hy  M.D.   On: 07/06/2020 04:40   CARDIAC CATHETERIZATION  Result Date: 07/07/2020  Prox RCA lesion is 60% stenosed.  Mid RCA to Dist RCA lesion is 80% stenosed.  Prox LAD to Mid LAD lesion is 80% stenosed.  Dist LAD lesion is 80% stenosed.  Cindy [name] is a 54 y.o. female  [id number] LOCATION:  FACILITY: [facility] PHYSICIAN: Cindy [name], M.D. 04/25/1966 DATE OF PROCEDURE:  07/07/2020 DATE OF DISCHARGE: CARDIAC CATHETERIZATION History obtained from chart review.Cindy [name] is a 54 y.o. female with a history of stroke, obstructive sleep apnea on CPAP, hypertension, hyperlipidemia, diabetes mellitus, GERD, and morbid obesity who presented to the ED with chest pain.  She ruled out for myocardial infarction.  Her EKG showed no acute changes.  She was heparinized and had no recurrent chest pain.  She does have a family history of heart disease.  She presents now for diagnostic  coronary angiography.    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  Her LVEDP was normal.  Her LV appeared normal with hand-injection although 2D echo will be obtained.  I used 180 cc of contrast to do the diagnostic because of extreme tortuosity in the brachiocephalic system.  I do not think intervention could be performed safely radially and I suspect she will need femoral access.  In addition, I did use 20 minutes of fluoroscopy time because of her large stature (112 kg).  I suspect the proximal LAD lesion would optimally be treated with orbital atherectomy for debulking prior to stenting although there is some tortuosity in the proximal ID which may make this technically challenging.  I did take the liberty of loading her with Plavix.  Her cardiac enzymes were negative.  I am going to reheparinize her.  The sheath was removed and a TR band was placed on the right wrist to achieve patent hemostasis.  The patient left the lab in stable condition. Cindy [name]. MD, [facility] 07/07/2020 8:40 AM    Cardiac Studies   Echo pending.  Cath showed   Prox RCA lesion is 60% stenosed.  Mid RCA to Dist RCA lesion is 80% stenosed.  Prox LAD to Mid LAD lesion is 80% stenosed.  Dist LAD lesion is 80% stenosed.  Recommendation for possible complex PCI. IC to review as team.    Patient Profile     Cindy [name] is a 54 y.o. female with a history of stroke, obstructive sleep apnea on CPAP, hypertension, hyperlipidemia, diabetes mellitus, GERD, and morbid obesity who presents to the ED with chest pain.  Assessment & Plan   Active Problems:   Unstable angina [facility])   Discussed in detail with patient and daughter.   Unstable Angina Cath shows RCA and LAD disease. Complex vascular anatomy from radial approach. Will need consideration of femoral access complex PCI.  Has been plavix loaded by Dr. Gwenlyn Found but may need consideration for bypass as well, will review with IC team. - Patient presents with chest pain concerning for unstable angina. - EKG shows no acute ischemic changes. - High-sensitivity troponin negative x2.  - Will check Echo. - Patient on Aspirin and Plavix at home following stroke in 2019.  - Continue statin and Zetia. - Toprol-XL 25mg  daily.  Hypertension - BP 189/95 on presentaiton. Improved but still mildly elevated although patient has not received any of home medications today.  - Continue home Fosinopril-HCTZ. -  formulary med is lisinopril.  - continue metoprolol.  - may need additional BP control with amlodipine considering dye load today, will avoid uptitration of ACE-I currently.  Hyperlipidemia - Continue home Crestor 40mg  daily and Zetia 10mg  daily. - lipids are not optimized on therapy, may need to visit with CVRR if compliant with meds for discussion of PCSK9I.LDL 153.  Uncontrolled Type 2 Diabetes Mellitus - Most recent hemoglobin A1c reportedly 12%. Will recheck here. - On Farxiga and Insulin at home. Will start sliding scale insulin here.  - May need to consult Diabetes Coordinator for assistance.  Obstructive Sleep Apnea - Continue CPAP.  Prior Stroke - History of stroke in 08/2018. - On DAPT with Aspirin and Plavix. Will hold Plavix for now in case she is found to have multivessel disease requiring CABG.    For questions or updates, please contact [place] Please consult [email or web address] for contact info under        Signed, Elouise Munroe, MD  07/07/2020, 11:55 AM

## 2020-07-07 NOTE — Progress Notes (Addendum)
Inpatient Diabetes Program Recommendations  AACE/ADA: New Consensus Statement on Inpatient Glycemic Control (2015)  Target Ranges:  Prepandial:   less than 140 mg/dL      Peak postprandial:   less than 180 mg/dL (1-2 hours)      Critically ill patients:  140 - 180 mg/dL   Lab Results  Component Value Date   GLUCAP 184 (H) 07/07/2020   HGBA1C 12.3 (H) 07/06/2020    Review of Glycemic Control  Inpatient Diabetes Program Recommendations:   Spoke with pt about A1C results 12.3 ( average blood glucose 306 over the past 2-3 months) and explained what an A1C is, basic pathophysiology of DM Type 2, basic home care, basic diabetes diet nutrition principles, importance of checking CBGs and maintaining good CBG control to prevent long-term and short-term complications. Reviewed signs and symptoms of hyperglycemia and hypoglycemia and how to treat hypoglycemia at home. Also reviewed blood sugar goals at home.  RNs to provide ongoing basic DM education at bedside with this patient. Have ordered educational booklet. Patient states she has been drinking sugary drinks but willing to change to sugar free and water and answered questions regarding nutrition related to carbohydrates and plate method. Requested list of local endocrinologists, so placed in discharge instructions to print for patient use.  Thank you, Nani Gasser. Precious Gilchrest, RN, MSN, CDE  Diabetes Coordinator Inpatient Glycemic Control Team Team Pager [phone] (8am-5pm) 07/07/2020 3:17 PM

## 2020-07-07 NOTE — Progress Notes (Signed)
Inpatient Diabetes Program Recommendations  AACE/ADA: New Consensus Statement on Inpatient Glycemic Control (2015)  Target Ranges:  Prepandial:   less than 140 mg/dL      Peak postprandial:   less than 180 mg/dL (1-2 hours)      Critically ill patients:  140 - 180 mg/dL   Lab Results  Component Value Date   GLUCAP 308 (H) 07/07/2020   HGBA1C 12.3 (H) 07/06/2020    Review of Glycemic Control  Diabetes history: DM 2 Outpatient Diabetes medications: Lantus 30 units, Farxia 10 mg Daily, Freestyle Libre CGM  Current orders for Inpatient glycemic control:  Novolog 0-20 units tid  A1c 12.3% on 8/8  Inpatient Diabetes Program Recommendations:    -  Add Lantus 20 units.  Thanks,  Tama Headings RN, MSN, BC-ADM Inpatient Diabetes Coordinator Team Pager [phone] (8a-5p)

## 2020-07-07 NOTE — Progress Notes (Signed)
  Echocardiogram 2D Echocardiogram has been performed.  Cindy [name] 07/07/2020, 1:53 PM

## 2020-07-07 NOTE — Interval H&P Note (Signed)
Cath Lab Visit (complete for each Cath Lab visit)  Clinical Evaluation Leading to the Procedure:   ACS: Yes.    Non-ACS:    Anginal Classification: CCS III  Anti-ischemic medical therapy: No Therapy  Non-Invasive Test Results: No non-invasive testing performed  Prior CABG: No previous CABG      History and Physical Interval Note:  07/07/2020 7:44 AM  Cindy [name]  has presented today for surgery, with the diagnosis of unstable angina.  The various methods of treatment have been discussed with the patient and family. After consideration of risks, benefits and other options for treatment, the patient has consented to  Procedure(s): LEFT HEART CATH AND CORONARY ANGIOGRAPHY (N/A) as a surgical intervention.  The patient's history has been reviewed, patient examined, no change in status, stable for surgery.  I have reviewed the patient's chart and labs.  Questions were answered to the patient's satisfaction.     Quay Burow

## 2020-07-08 ENCOUNTER — Encounter (HOSPITAL_COMMUNITY): Payer: Self-pay | Admitting: Interventional Cardiology

## 2020-07-08 ENCOUNTER — Encounter (HOSPITAL_COMMUNITY)
Admission: EM | Disposition: A | Discharge status: Home / Self Care | Payer: Self-pay | Source: Home / Self Care | Attending: Internal Medicine

## 2020-07-08 HISTORY — PX: CORONARY STENT INTERVENTION: CATH118234

## 2020-07-08 HISTORY — PX: INTRAVASCULAR ULTRASOUND/IVUS: CATH118244

## 2020-07-08 LAB — BASIC METABOLIC PANEL
Anion gap: 12 (ref 5–15)
BUN: 7 mg/dL (ref 6–20)
CO2: 24 mmol/L (ref 22–32)
Calcium: 9.1 mg/dL (ref 8.9–10.3)
Chloride: 99 mmol/L (ref 98–111)
Creatinine, Ser: 0.69 mg/dL (ref 0.44–1.00)
GFR calc Af Amer: >60 mL/min (ref >60)
GFR calc non Af Amer: >60 mL/min (ref >60)
Glucose, Bld: 285 mg/dL — ABNORMAL HIGH (ref 70–99)
Potassium: 3.8 mmol/L (ref 3.5–5.1)
Sodium: 135 mmol/L (ref 135–145)

## 2020-07-08 LAB — CBC
HCT: 37.2 % (ref 36.0–46.0)
Hemoglobin: 11.8 g/dL — ABNORMAL LOW (ref 12.0–15.0)
MCH: 29.3 pg (ref 26.0–34.0)
MCHC: 31.7 g/dL (ref 30.0–36.0)
MCV: 92.3 fL (ref 80.0–100.0)
Platelets: 374 10*3/uL (ref 150–400)
RBC: 4.03 MIL/uL (ref 3.87–5.11)
RDW: 13.1 % (ref 11.5–15.5)
WBC: 9.3 10*3/uL (ref 4.0–10.5)
nRBC: 0 % (ref 0.0–0.2)

## 2020-07-08 LAB — GLUCOSE, CAPILLARY
Glucose-Capillary: 291 mg/dL — ABNORMAL HIGH (ref 70–99)
Glucose-Capillary: 302 mg/dL — ABNORMAL HIGH (ref 70–99)
Glucose-Capillary: 256 mg/dL — ABNORMAL HIGH (ref 70–99)
Glucose-Capillary: 221 mg/dL — ABNORMAL HIGH (ref 70–99)
Glucose-Capillary: 246 mg/dL — ABNORMAL HIGH (ref 70–99)

## 2020-07-08 LAB — HEPARIN LEVEL (UNFRACTIONATED): Heparin Unfractionated: 0.28 IU/mL — ABNORMAL LOW (ref 0.30–0.70)

## 2020-07-08 LAB — POCT ACTIVATED CLOTTING TIME
Activated Clotting Time: 197 seconds
Activated Clotting Time: 169 seconds

## 2020-07-08 SURGERY — INTRAVASCULAR ULTRASOUND/IVUS
Anesthesia: LOCAL

## 2020-07-08 MED ORDER — SODIUM CHLORIDE 0.9% FLUSH: 3.0000 mL | INTRAVENOUS | Status: DC | PRN

## 2020-07-08 MED ORDER — LABETALOL HCL 5 MG/ML IV SOLN
INTRAVENOUS | Status: AC
  Filled 2020-07-08: qty 4

## 2020-07-08 MED ORDER — HYDRALAZINE HCL 20 MG/ML IJ SOLN
INTRAMUSCULAR | Status: DC | PRN
  Administered 2020-07-08: 10 mg via INTRAVENOUS

## 2020-07-08 MED ORDER — LABETALOL HCL 5 MG/ML IV SOLN
10.0000 mg | INTRAVENOUS | Status: AC | PRN
  Administered 2020-07-08 (×2): 10 mg via INTRAVENOUS

## 2020-07-08 MED ORDER — HEPARIN SODIUM (PORCINE) 1000 UNIT/ML IJ SOLN
INTRAMUSCULAR | Status: DC | PRN
  Administered 2020-07-08: 12000 [IU] via INTRAVENOUS
  Administered 2020-07-08 (×2): 2000 [IU] via INTRAVENOUS

## 2020-07-08 MED ORDER — HEPARIN (PORCINE) IN NACL 1000-0.9 UT/500ML-% IV SOLN
INTRAVENOUS | Status: AC
  Filled 2020-07-08: qty 1000

## 2020-07-08 MED ORDER — FENTANYL CITRATE (PF) 100 MCG/2ML IJ SOLN
INTRAMUSCULAR | Status: AC
  Filled 2020-07-08: qty 2

## 2020-07-08 MED ORDER — CLOPIDOGREL BISULFATE 75 MG PO TABS
75.0000 mg | ORAL_TABLET | Freq: Every day | ORAL | Status: DC
Start: 2020-07-09 — End: 2020-07-08

## 2020-07-08 MED ORDER — IOHEXOL 350 MG/ML SOLN
INTRAVENOUS | Status: DC | PRN
  Administered 2020-07-08: 111 mL via INTRA_ARTERIAL

## 2020-07-08 MED ORDER — MORPHINE SULFATE (PF) 2 MG/ML IV SOLN
INTRAVENOUS | Status: AC
  Filled 2020-07-08: qty 1

## 2020-07-08 MED ORDER — INSULIN ASPART 100 UNIT/ML ~~LOC~~ SOLN
SUBCUTANEOUS | Status: AC
  Filled 2020-07-08: qty 1

## 2020-07-08 MED ORDER — LIDOCAINE HCL (PF) 1 % IJ SOLN
INTRAMUSCULAR | Status: DC | PRN
  Administered 2020-07-08: 30 mL

## 2020-07-08 MED ORDER — SODIUM CHLORIDE 0.9 % IV SOLN: 250.0000 mL | INTRAVENOUS | Status: DC | PRN

## 2020-07-08 MED ORDER — SODIUM CHLORIDE 0.9 % IV SOLN: INTRAVENOUS | Status: AC

## 2020-07-08 MED ORDER — MIDAZOLAM HCL 2 MG/2ML IJ SOLN
INTRAMUSCULAR | Status: AC
  Filled 2020-07-08: qty 2

## 2020-07-08 MED ORDER — ASPIRIN 81 MG PO CHEW
81.0000 mg | CHEWABLE_TABLET | ORAL | Status: AC
  Administered 2020-07-08: 81 mg via ORAL

## 2020-07-08 MED ORDER — FENTANYL CITRATE (PF) 100 MCG/2ML IJ SOLN
INTRAMUSCULAR | Status: DC | PRN
  Administered 2020-07-08: 25 ug via INTRAVENOUS
  Administered 2020-07-08: 50 ug via INTRAVENOUS

## 2020-07-08 MED ORDER — MIDAZOLAM HCL 2 MG/2ML IJ SOLN
INTRAMUSCULAR | Status: DC | PRN
  Administered 2020-07-08: 1 mg via INTRAVENOUS
  Administered 2020-07-08: 2 mg via INTRAVENOUS

## 2020-07-08 MED ORDER — HYDRALAZINE HCL 20 MG/ML IJ SOLN
INTRAMUSCULAR | Status: AC
  Filled 2020-07-08: qty 1

## 2020-07-08 MED ORDER — ONDANSETRON HCL 4 MG/2ML IJ SOLN: 4.0000 mg | Freq: Four times a day (QID) | INTRAMUSCULAR | Status: DC | PRN

## 2020-07-08 MED ORDER — LIDOCAINE HCL (PF) 1 % IJ SOLN
INTRAMUSCULAR | Status: AC
  Filled 2020-07-08: qty 30

## 2020-07-08 MED ORDER — SODIUM CHLORIDE 0.9 % WEIGHT BASED INFUSION
3.0000 mL/kg/h | INTRAVENOUS | Status: DC
  Administered 2020-07-08: 3 mL/kg/h via INTRAVENOUS

## 2020-07-08 MED ORDER — SODIUM CHLORIDE 0.9 % IV SOLN
250.0000 mL | INTRAVENOUS | Status: DC | PRN
  Filled 2020-07-08: qty 250

## 2020-07-08 MED ORDER — ACETAMINOPHEN 325 MG PO TABS: 650.0000 mg | ORAL_TABLET | ORAL | Status: DC | PRN

## 2020-07-08 MED ORDER — ASPIRIN 81 MG PO CHEW
81.0000 mg | CHEWABLE_TABLET | Freq: Every day | ORAL | Status: DC
Start: 2020-07-08 — End: 2020-07-08

## 2020-07-08 MED ORDER — HYDRALAZINE HCL 20 MG/ML IJ SOLN: 10.0000 mg | INTRAMUSCULAR | Status: AC | PRN

## 2020-07-08 MED ORDER — SODIUM CHLORIDE 0.9% FLUSH
3.0000 mL | Freq: Two times a day (BID) | INTRAVENOUS | Status: DC
  Administered 2020-07-08: 3 mL via INTRAVENOUS

## 2020-07-08 MED ORDER — SODIUM CHLORIDE 0.9 % WEIGHT BASED INFUSION
1.0000 mL/kg/h | INTRAVENOUS | Status: DC
  Administered 2020-07-08: 1 mL/kg/h via INTRAVENOUS

## 2020-07-08 MED ORDER — SODIUM CHLORIDE 0.9% FLUSH: 3.0000 mL | Freq: Two times a day (BID) | INTRAVENOUS | Status: DC

## 2020-07-08 MED ORDER — NITROGLYCERIN 1 MG/10 ML FOR IR/CATH LAB
INTRA_ARTERIAL | Status: AC
  Filled 2020-07-08: qty 10

## 2020-07-08 MED ORDER — HEPARIN (PORCINE) IN NACL 1000-0.9 UT/500ML-% IV SOLN
INTRAVENOUS | Status: DC | PRN
  Administered 2020-07-08 (×2): 500 mL

## 2020-07-08 SURGICAL SUPPLY — 20 items
BALLN SAPPHIRE 2.5X15 (BALLOONS) ×2
BALLN SAPPHIRE ~~LOC~~ 3.0X12 (BALLOONS) ×1 IMPLANT
BALLN SAPPHIRE ~~LOC~~ 4.0X12 (BALLOONS) ×1 IMPLANT
BALLOON SAPPHIRE 2.5X15 (BALLOONS) IMPLANT
CATH LAUNCHER 6FR EBU 3.75 (CATHETERS) ×1 IMPLANT
CATH OPTICROSS HD (CATHETERS) ×2 IMPLANT
ELECT DEFIB PAD ADLT CADENCE (PAD) ×1 IMPLANT
KIT ENCORE 26 ADVANTAGE (KITS) ×1 IMPLANT
KIT HEART LEFT (KITS) ×2 IMPLANT
KIT HEMO VALVE WATCHDOG (MISCELLANEOUS) ×1 IMPLANT
PACK CARDIAC CATHETERIZATION (CUSTOM PROCEDURE TRAY) ×2 IMPLANT
SHEATH PINNACLE 6F 10CM (SHEATH) ×1 IMPLANT
SHEATH PROBE COVER 6X72 (BAG) ×1 IMPLANT
SLED PULL BACK IVUS (MISCELLANEOUS) ×1 IMPLANT
STENT RESOLUTE ONYX 2.5X18 (Permanent Stent) ×1 IMPLANT
STENT RESOLUTE ONYX 3.5X22 (Permanent Stent) ×1 IMPLANT
TRANSDUCER W/STOPCOCK (MISCELLANEOUS) ×2 IMPLANT
TUBING CIL FLEX 10 FLL-RA (TUBING) ×2 IMPLANT
WIRE ASAHI PROWATER 180CM (WIRE) ×1 IMPLANT
WIRE EMERALD 3MM-J .035X150CM (WIRE) ×1 IMPLANT

## 2020-07-08 NOTE — Progress Notes (Signed)
Site area: rt groin fa sheath Site Prior to Removal:  Level 0 Pressure Applied For: 20 minutes Manual:   yes Patient Status During Pull:  stable Post Pull Site:  Level 0 Post Pull Instructions Given:  yes Post Pull Pulses Present: rt dp palpable Dressing Applied:  Gauze and tgaderm Bedrest begins @ 4166 Comments:

## 2020-07-08 NOTE — Progress Notes (Signed)
ANTICOAGULATION CONSULT NOTE - Follow Up Consult  Pharmacy Consult for heparin Indication: 2VCAD awaiting atherectomy/complex PCI  Labs: Recent Labs     0000 07/06/20 0500 07/06/20 0616 07/06/20 2245 07/07/20 0118 07/07/20 0515 07/07/20 2047 07/08/20 0454  HGB   < > 11.4*  --   --  10.7*  --   --  11.8*  HCT  --  37.4  --   --  34.2*  --   --  37.2  PLT  --  363  --   --  329  --   --  374  HEPARINUNFRC  --   --   --    < >  --  <0.10* 0.19* 0.28*  CREATININE  --  0.66  --   --  0.65  --   --   --   TROPONINIHS  --  7 4  --   --   --   --   --    < > = values in this interval not displayed.    Assessment: 54yo female subtherapeutic on heparin after rate change; no gtt issues or signs of bleeding per RN.  Goal of Therapy:  Heparin level 0.3-0.7 units/ml   Plan:  Will increase heparin gtt by ~1-2 units/kg/hr to 1700 units/hr and check level in 6 hours.    Rogue Bussing 07/08/2020,5:25 AM

## 2020-07-08 NOTE — Progress Notes (Signed)
Inpatient Diabetes Program Recommendations  AACE/ADA: New Consensus Statement on Inpatient Glycemic Control (2015)  Target Ranges:  Prepandial:   less than 140 mg/dL      Peak postprandial:   less than 180 mg/dL (1-2 hours)      Critically ill patients:  140 - 180 mg/dL   Lab Results  Component Value Date   GLUCAP 302 (H) 07/08/2020   HGBA1C 12.3 (H) 07/06/2020    Review of Glycemic Control Results for Cindy [name], Cindy [name] (MRN [id number]) as of 07/08/2020 10:49  Ref. Range 07/07/2020 16:18 07/07/2020 21:06 07/08/2020 06:19 07/08/2020 09:55  Glucose-Capillary Latest Ref Range: 70 - 99 mg/dL 231 (H) 264 (H) 291 (H) 302 (H)   Diabetes history: DM 2 Outpatient Diabetes medications: Lantus 30 units, Farxia 10 mg Daily, Freestyle Libre CGM  Current orders for Inpatient glycemic control:  Novolog 0-20 units tid  Inpatient Diabetes Program Recommendations:     Lantus 20 units daily   Will continue to follow while inpatient.  Thank you, Reche Dixon, RN, BSN Diabetes Coordinator Inpatient Diabetes Program [phone] (team pager from 8a-5p)

## 2020-07-08 NOTE — Interval H&P Note (Signed)
History and Physical Interval Note:Cath Lab Visit (complete for each Cath Lab visit)  Clinical Evaluation Leading to the Procedure:   ACS: Yes.    Non-ACS:    Anginal Classification: CCS IV  Anti-ischemic medical therapy: Minimal Therapy (1 class of medications)  Non-Invasive Test Results: No non-invasive testing performed  Prior CABG: No previous CABG   Planned PCI of LAD from the groin due to subclavian tortuosity.     07/08/2020 7:34 AM  Cindy [name]  has presented today for surgery, with the diagnosis of cad.  The various methods of treatment have been discussed with the patient and family. After consideration of risks, benefits and other options for treatment, the patient has consented to  Procedure(s): CORONARY ATHERECTOMY (N/A) as a surgical intervention.  The patient's history has been reviewed, patient examined, no change in status, stable for surgery.  I have reviewed the patient's chart and labs.  Questions were answered to the patient's satisfaction.     Larae Grooms

## 2020-07-09 ENCOUNTER — Encounter (HOSPITAL_COMMUNITY): Payer: Self-pay | Admitting: Internal Medicine

## 2020-07-09 DIAGNOSIS — Z95820 Peripheral vascular angioplasty status with implants and grafts: Secondary | ICD-10-CM

## 2020-07-09 DIAGNOSIS — I2510 Atherosclerotic heart disease of native coronary artery without angina pectoris: Secondary | ICD-10-CM

## 2020-07-09 HISTORY — DX: Atherosclerotic heart disease of native coronary artery without angina pectoris: I25.10

## 2020-07-09 HISTORY — DX: Peripheral vascular angioplasty status with implants and grafts: Z95.820

## 2020-07-09 LAB — BASIC METABOLIC PANEL
Anion gap: 11 (ref 5–15)
BUN: 11 mg/dL (ref 6–20)
CO2: 25 mmol/L (ref 22–32)
Calcium: 8.7 mg/dL — ABNORMAL LOW (ref 8.9–10.3)
Chloride: 100 mmol/L (ref 98–111)
Creatinine, Ser: 0.7 mg/dL (ref 0.44–1.00)
GFR calc Af Amer: >60 mL/min (ref >60)
GFR calc non Af Amer: >60 mL/min (ref >60)
Glucose, Bld: 274 mg/dL — ABNORMAL HIGH (ref 70–99)
Potassium: 3.6 mmol/L (ref 3.5–5.1)
Sodium: 136 mmol/L (ref 135–145)

## 2020-07-09 LAB — CBC
HCT: 33.9 % — ABNORMAL LOW (ref 36.0–46.0)
Hemoglobin: 10.8 g/dL — ABNORMAL LOW (ref 12.0–15.0)
MCH: 29.8 pg (ref 26.0–34.0)
MCHC: 31.9 g/dL (ref 30.0–36.0)
MCV: 93.4 fL (ref 80.0–100.0)
Platelets: 339 10*3/uL (ref 150–400)
RBC: 3.63 MIL/uL — ABNORMAL LOW (ref 3.87–5.11)
RDW: 13.4 % (ref 11.5–15.5)
WBC: 8.3 10*3/uL (ref 4.0–10.5)
nRBC: 0 % (ref 0.0–0.2)

## 2020-07-09 LAB — POCT ACTIVATED CLOTTING TIME
Activated Clotting Time: 274 seconds
Activated Clotting Time: 285 seconds

## 2020-07-09 LAB — GLUCOSE, CAPILLARY: Glucose-Capillary: 258 mg/dL — ABNORMAL HIGH (ref 70–99)

## 2020-07-09 MED ORDER — ACETAMINOPHEN 325 MG PO TABS: 650.0000 mg | ORAL_TABLET | ORAL | Status: DC | PRN

## 2020-07-09 MED ORDER — NITROGLYCERIN 0.4 MG SL SUBL: 0.4000 mg | SUBLINGUAL_TABLET | SUBLINGUAL | 4 refills | Status: DC | PRN

## 2020-07-09 MED ORDER — ASPIRIN 81 MG PO CHEW: 81.0000 mg | CHEWABLE_TABLET | Freq: Every day | ORAL | Status: DC

## 2020-07-09 MED ORDER — METOPROLOL SUCCINATE ER 25 MG PO TB24: 25.0000 mg | ORAL_TABLET | Freq: Every day | ORAL | 6 refills | Status: DC

## 2020-07-09 MED ORDER — CLOPIDOGREL BISULFATE 75 MG PO TABS: 75.0000 mg | ORAL_TABLET | Freq: Every day | ORAL | 6 refills | Status: DC

## 2020-07-09 MED FILL — Nitroglycerin IV Soln 100 MCG/ML in D5W: INTRA_ARTERIAL | Qty: 10 | Status: AC

## 2020-07-09 MED FILL — Heparin Sod (Porcine)-NaCl IV Soln 1000 Unit/500ML-0.9%: INTRAVENOUS | Qty: 500 | Status: AC

## 2020-07-09 NOTE — Progress Notes (Signed)
CARDIAC REHAB PHASE I   PRE:  Rate/Rhythm: 81 SR  BP:  Supine:   Sitting: 161/80  Standing:    SaO2: 95%RA  MODE:  Ambulation: 200 ft   POST:  Rate/Rhythm: 102 ST  BP:  Supine:   Sitting: 156/75  Standing:    SaO2: 94%RA 3729-0211 Pt walked 200 ft on RA with slow steady gait. No CP. Tolerated well. Completed education with pt and daughter who voiced understanding. Stressed importance of plavix with stents. Reviewed NTG use, carb counting and heart healthy food choices, walking for ex, and CRP 2. Referred to [facility] program. Pt is in process of getting back under care of endocrinologist for A1C of over 12 now.  Stressed importance of getting better control of diabetes.    Graylon Good, RN BSN  07/09/2020 9:09 AM

## 2020-07-09 NOTE — Discharge Instructions (Signed)
Call [facility] at [phone] if any bleeding, swelling or drainage at cath site.  May shower, no tub baths for 48 hours for groin sticks. No lifting over 5 pounds for 3 days.  No Driving for 3 days  Take 1 NTG, under your tongue, while sitting.  If no relief of pain may repeat NTG, one tab every 5 minutes up to 3 tablets total over 15 minutes.  If no relief CALL 911.  If you have dizziness/lightheadness  while taking NTG, stop taking and call 911.        Heart Healthy Diabetic diet   Do not stop asprin or plavix, these meds keep the new stents open, stopping could cause a heart attack  Please see endocrinologist for diabetic management.   Local Endocrinologists [facility] [phone]) 1. Dr. Philemon Kingdom 2. Dr. Elayne Snare 3. Abby Shamleffer 4. Dr. Wende Mott [facility] [phone]) 1. Dr. Delrae Rend [facility] [phone]) 1. Dr. Jacelyn Pi 2. Dr. Madelin Rear [facility] [phone]) 1. Dr. Daneil Dolin [facility] [phone]) [[place] office]  [phone]) [[place] office] 1. Dr. Lenna Sciara [place] 2. Dr. Mee Hives [facility]) [phone]) 1. Autumn Hudnall Ronnald Ramp), PA 2. Dr. Amalia Greenhouse 3. Dr. Marsh Dolly. [facility] [phone]) 1. Dr. Glade Lloyd [facility] [phone]) 1. Dr. Orville Govern 2. Dr. Lelon Huh 3. Dr. Jerelene Redden 4. Alwyn Ren, FNP

## 2020-07-09 NOTE — Discharge Summary (Addendum)
Discharge Summary    Patient ID: Cindy [name] MRN: [id number]; DOB: 07/30/1966  Admit date: 07/06/2020 Discharge date: 07/09/2020  Primary Care Provider: Lin Landsman, MD  Primary Cardiologist: Elouise Munroe, MD  Primary Electrophysiologist:  None   Discharge Diagnoses    Principal Problem:   Unstable angina [facility]) Active Problems:   S/P angioplasty with stent 07/08/20 DES to dLAD and DES to mLAD    Type 2 diabetes mellitus with complication, with long-term current use of insulin (HCC)   HLD (hyperlipidemia)   Benign essential HTN   CAD in native artery    Diagnostic Studies/Procedures    07/07/20 cardiac cath   Prox RCA lesion is 60% stenosed.  Mid RCA to Dist RCA lesion is 80% stenosed.  Prox LAD to Mid LAD lesion is 80% stenosed.  Dist LAD lesion is 80% stenosed.   07/07/20 Echo 1. Left ventricular ejection fraction, by estimation, is 60 to 65%. The  left ventricle has normal function. The left ventricle has no regional  wall motion abnormalities. There is mild concentric left ventricular  hypertrophy. Left ventricular diastolic  parameters were normal.  2. Right ventricular systolic function is normal. The right ventricular  size is normal. Tricuspid regurgitation signal is inadequate for assessing  PA pressure.  3. The mitral valve is grossly normal. Trivial mitral valve  regurgitation. No evidence of mitral stenosis.  4. The aortic valve is tricuspid. Aortic valve regurgitation is not  visualized. No aortic stenosis is present.  5. The inferior vena cava is normal in size with greater than 50%  respiratory variability, suggesting right atrial pressure of 3 mmHg.   FINDINGS  Left Ventricle: Left ventricular ejection fraction, by estimation, is 60  to 65%. The left ventricle has normal function. The left ventricle has no  regional wall motion abnormalities. The left ventricular internal cavity  size was normal in size. There is  mild concentric  left ventricular hypertrophy. Left ventricular diastolic  parameters were normal. Normal left ventricular filling pressure.   Right Ventricle: The right ventricular size is normal. No increase in  right ventricular wall thickness. Right ventricular systolic function is  normal. Tricuspid regurgitation signal is inadequate for assessing PA  pressure.   Left Atrium: Left atrial size was normal in size.   Right Atrium: Right atrial size was normal in size.   Pericardium: Trivial pericardial effusion is present. Presence of  pericardial fat pad.   Mitral Valve: The mitral valve is grossly normal. Trivial mitral valve  regurgitation. No evidence of mitral valve stenosis.   Tricuspid Valve: The tricuspid valve is grossly normal. Tricuspid valve  regurgitation is trivial. No evidence of tricuspid stenosis.   Aortic Valve: The aortic valve is tricuspid. Aortic valve regurgitation is  not visualized. No aortic stenosis is present.   Pulmonic Valve: The pulmonic valve was grossly normal. Pulmonic valve  regurgitation is not visualized.   Aorta: The aortic root and ascending aorta are structurally normal, with  no evidence of dilitation.   Venous: The inferior vena cava is normal in size with greater than 50%  respiratory variability, suggesting right atrial pressure of 3 mmHg.   IAS/Shunts: The atrial septum is grossly normal.     LEFT VENTRICLE  PLAX 2D  LVIDd:     4.00 cm Diastology  LVIDs:     2.60 cm LV e' lateral:  7.40 cm/s  LV PW:     1.20 cm LV E/e' lateral: 14.5  LV IVS:    1.20  cm LV e' medial:  7.29 cm/s  LVOT diam:   2.00 cm LV E/e' medial: 14.7  LV SV:     75  LV SV Index:  35  LVOT Area:   3.14 cm     RIGHT VENTRICLE       IVC  RV S prime:   11.30 cm/s IVC diam: 2.00 cm  TAPSE (M-mode): 2.6 cm   LEFT ATRIUM       Index    RIGHT ATRIUM      Index  LA diam:    4.00 cm 1.88 cm/m RA Area:   13.00  cm  LA Vol (A2C):  42.6 ml 20.03 ml/m RA Volume:  29.50 ml 13.87 ml/m  LA Vol (A4C):  52.7 ml 24.78 ml/m  LA Biplane Vol: 50.2 ml 23.60 ml/m  AORTIC VALVE  LVOT Vmax:  96.70 cm/s  LVOT Vmean: 62.700 cm/s  LVOT VTI:  0.240 m    AORTA  Ao Root diam: 2.70 cm  Ao Asc diam: 3.10 cm   MITRAL VALVE  MV Area (PHT): 3.31 cm   SHUNTS  MV Decel Time: 229 msec   Systemic VTI: 0.24 m  MV E velocity: 107.00 cm/s Systemic Diam: 2.00 cm  MV A velocity: 101.00 cm/s  MV E/A ratio: 1.06  _____________    Cardiac cath PCI 07/08/20   Dist LAD lesion is 80% stenosed.  A drug-eluting stent was successfully placed using a STENT RESOLUTE ONYX 2.5X18, postdilated with a 3.0 [place] balloon and optimized wth IVUS.  Post intervention, there is a 0% residual stenosis.  Mid LAD lesion is 80% stenosed. Minimal calcification by IVUS so atherectomy was not required.  A drug-eluting stent was successfully placed using a STENT RESOLUTE ONYX 3.5X22, postdilated with a 4.0 [place] balloon and optimized wth IVUS.  Post intervention, there is a 0% residual stenosis.   Continue aggressive secondary prevention.  I stressed the importance of DAPT.      History of Present Illness     Cindy [name] is a 54 y.o. female with hx of CVA, OSA on CPAP, HTN, HLD, DM poorly controlled, GERD and morbid obesity presented to ER 07/06/20 with chest pain.  No prior cardiac eval.  + premature hx of CAD in father died at 44 with MI.  Pt on ASA and plavix since 2019 for CVA.     Her chest pain woke her from sleep 9/10 in severity  Substernal pain with radiation to lt side of neck and Lt arm, + SOB and lightheadedness.  On discussion she did note exertional chest pain over last few months.   Troponin neg at 7 & 4,  Hgb 11, Cr 0.66.  CXR NAD.  NTG in ER resolved the chest pain.   EKG with SR with no acute ischemic changes.  Tele with SR in 32s  With multiple risk factors for CAD and accelerating angina she was  admitted with plans for echo and cardiac cath.   Hospital Course     Consultants: none   Pt did have elevated LDL at 153, tChol 219 HDL 36.  Hgb A1c of 12.3. neg pregnancy and neg COVID.     Pt had cardiac cath 07/07/20 results as above.  Dr. Gwenlyn Found did not believe procedure could be performed radially so IV heparin resumed and procedure planned with femoral approach on the 10th.  Echo and cath with normal LV function.     Pt underwent PCI on the 29HB without complications, receiving  2 stents-DES to LAD distal and Mid.   Today she feels well.  Has ambulated with cardiac rehab and has been seen by Dr. Margaretann Loveless and found stable for discharge.    She is on crestor and zetia will need to check lipids in 6 weeks.  She has been given information for endocrinologist.   Did the patient have an acute coronary syndrome (MI, NSTEMI, STEMI, etc) this admission?:  No                               Did the patient have a percutaneous coronary intervention (stent / angioplasty)?:  Yes.     Cath/PCI Registry Performance & Quality Measures: 1. Aspirin prescribed? - Yes 2. ADP Receptor Inhibitor (Plavix/Clopidogrel, Brilinta/Ticagrelor or Effient/Prasugrel) prescribed (includes medically managed patients)? - Yes 3. High Intensity Statin (Lipitor 40-80mg  or Crestor 20-40mg ) prescribed? - Yes 4. For EF <40%, was ACEI/ARB prescribed? - Not Applicable (EF >/= 76%) 5. For EF <40%, Aldosterone Antagonist (Spironolactone or Eplerenone) prescribed? - Not Applicable (EF >/= 54%) 6. Cardiac Rehab Phase II ordered? - Yes   _____________  Discharge Vitals Blood pressure (!) 161/80, pulse 80, temperature 97.9 F (36.6 C), temperature source Oral, resp. rate 16, height 5\' 3"  (1.6 m), weight 115 kg, SpO2 96 %.  Filed Weights   07/07/20 0154 07/08/20 0435 07/08/20 1346  Weight: 113.5 kg 111.7 kg 115 kg   PE per Dr. Liane Comber & Radiologic Studies    CBC Recent Labs    07/08/20 0454 07/09/20 0448  WBC 9.3  8.3  HGB 11.8* 10.8*  HCT 37.2 33.9*  MCV 92.3 93.4  PLT 374 650   Basic Metabolic Panel Recent Labs    07/08/20 0454 07/09/20 0448  NA 135 136  K 3.8 3.6  CL 99 100  CO2 24 25  GLUCOSE 285* 274*  BUN 7 11  CREATININE 0.69 0.70  CALCIUM 9.1 8.7*   Liver Function Tests No results for input(s): AST, ALT, ALKPHOS, BILITOT, PROT, ALBUMIN in the last 72 hours. No results for input(s): LIPASE, AMYLASE in the last 72 hours. High Sensitivity Troponin:   Recent Labs  Lab 07/06/20 0500 07/06/20 0616  TROPONINIHS 7 4    BNP Invalid input(s): POCBNP D-Dimer No results for input(s): DDIMER in the last 72 hours. Hemoglobin A1C Recent Labs    07/06/20 2245  HGBA1C 12.3*   Fasting Lipid Panel Recent Labs    07/07/20 0118  CHOL 219*  HDL 36*  LDLCALC 153*  TRIG 150*  CHOLHDL 6.1   Thyroid Function Tests No results for input(s): TSH, T4TOTAL, T3FREE, THYROIDAB in the last 72 hours.  Invalid input(s): FREET3 _____________  DG Chest 2 View  Result Date: 07/06/2020 CLINICAL DATA:  Chest pain EXAM: CHEST - 2 VIEW COMPARISON:  06/20/2018 FINDINGS: Lungs are clear.  No pleural effusion or pneumothorax. The heart is normal in size. Mild degenerative changes of the visualized thoracolumbar spine. IMPRESSION: Normal chest radiographs. Electronically Signed   By: Julian Hy M.D.   On: 07/06/2020 04:40   CARDIAC CATHETERIZATION  Result Date: 07/08/2020  Dist LAD lesion is 80% stenosed.  A drug-eluting stent was successfully placed using a STENT RESOLUTE ONYX 2.5X18, postdilated with a 3.0 [place] balloon and optimized wth IVUS.  Post intervention, there is a 0% residual stenosis.  Mid LAD lesion is 80% stenosed. Minimal calcification by IVUS so atherectomy was not required.  A drug-eluting stent  was successfully placed using a STENT RESOLUTE ONYX 3.5X22, postdilated with a 4.0 [place] balloon and optimized wth IVUS.  Post intervention, there is a 0% residual stenosis.  Continue  aggressive secondary prevention.  I stressed the importance of DAPT.    CARDIAC CATHETERIZATION  Result Date: 07/07/2020  Prox RCA lesion is 60% stenosed.  Mid RCA to Dist RCA lesion is 80% stenosed.  Prox LAD to Mid LAD lesion is 80% stenosed.  Dist LAD lesion is 80% stenosed.  Cindy [name] is a 54 y.o. female  [id number] LOCATION:  FACILITY: [facility] PHYSICIAN: Quay Burow, M.D. [birth year] DATE OF PROCEDURE:  07/07/2020 DATE OF DISCHARGE: CARDIAC CATHETERIZATION History obtained from chart review.Cindy [name] is a 54 y.o. female with a history of stroke, obstructive sleep apnea on CPAP, hypertension, hyperlipidemia, diabetes mellitus, GERD, and morbid obesity who presented to the ED with chest pain.  She ruled out for myocardial infarction.  Her EKG showed no acute changes.  She was heparinized and had no recurrent chest pain.  She does have a family history of heart disease.  She presents now for diagnostic coronary angiography.    Ms. Carmicheal has significant two-vessel disease in a codominant system including a calcified segmental proximal LAD lesion, mid to distal LAD lesion and distal codominant RCA lesion.  Her LVEDP was normal.  Her LV appeared normal with hand-injection although 2D echo will be obtained.  I used 180 cc of contrast to do the diagnostic because of extreme tortuosity in the brachiocephalic system.  I do not think intervention could be performed safely radially and I suspect she will need femoral access.  In addition, I did use 20 minutes of fluoroscopy time because of her large stature (112 kg).  I suspect the proximal LAD lesion would optimally be treated with orbital atherectomy for debulking prior to stenting although there is some tortuosity in the proximal ID which may make this technically challenging.  I did take the liberty of loading her with Plavix.  Her cardiac enzymes were negative.  I am going to reheparinize her.  The sheath was removed and a TR band was placed on  the right wrist to achieve patent hemostasis.  The patient left the lab in stable condition. Quay Burow. MD, [facility] 07/07/2020 8:40 AM   ECHOCARDIOGRAM COMPLETE  Result Date: 07/07/2020    ECHOCARDIOGRAM REPORT   Patient Name:   ELLENE BLOODSAW Schelling Date of Exam: 07/07/2020 Medical Rec #:  [id number]          Height:       63.0 in Accession #:    [id number]         Weight:       250.2 lb Date of Birth:  1966/05/15         BSA:          2.127 m Patient Age:    21 years           BP:           144/68 mmHg Patient Gender: F                  HR:           70 bpm. Exam Location:  Inpatient Procedure: 2D Echo Indications:    chest pain 786.50  History:        Patient has prior history of Echocardiogram examinations, most                 recent  08/19/2018. Risk Factors:Diabetes, Dyslipidemia and                 Hypertension.  Sonographer:    Johny Chess Referring Phys: [id number] [place]  1. Left ventricular ejection fraction, by estimation, is 60 to 65%. The left ventricle has normal function. The left ventricle has no regional wall motion abnormalities. There is mild concentric left ventricular hypertrophy. Left ventricular diastolic parameters were normal.  2. Right ventricular systolic function is normal. The right ventricular size is normal. Tricuspid regurgitation signal is inadequate for assessing PA pressure.  3. The mitral valve is grossly normal. Trivial mitral valve regurgitation. No evidence of mitral stenosis.  4. The aortic valve is tricuspid. Aortic valve regurgitation is not visualized. No aortic stenosis is present.  5. The inferior vena cava is normal in size with greater than 50% respiratory variability, suggesting right atrial pressure of 3 mmHg. FINDINGS  Left Ventricle: Left ventricular ejection fraction, by estimation, is 60 to 65%. The left ventricle has normal function. The left ventricle has no regional wall motion abnormalities. The left ventricular internal cavity size was  normal in size. There is  mild concentric left ventricular hypertrophy. Left ventricular diastolic parameters were normal. Normal left ventricular filling pressure. Right Ventricle: The right ventricular size is normal. No increase in right ventricular wall thickness. Right ventricular systolic function is normal. Tricuspid regurgitation signal is inadequate for assessing PA pressure. Left Atrium: Left atrial size was normal in size. Right Atrium: Right atrial size was normal in size. Pericardium: Trivial pericardial effusion is present. Presence of pericardial fat pad. Mitral Valve: The mitral valve is grossly normal. Trivial mitral valve regurgitation. No evidence of mitral valve stenosis. Tricuspid Valve: The tricuspid valve is grossly normal. Tricuspid valve regurgitation is trivial. No evidence of tricuspid stenosis. Aortic Valve: The aortic valve is tricuspid. Aortic valve regurgitation is not visualized. No aortic stenosis is present. Pulmonic Valve: The pulmonic valve was grossly normal. Pulmonic valve regurgitation is not visualized. Aorta: The aortic root and ascending aorta are structurally normal, with no evidence of dilitation. Venous: The inferior vena cava is normal in size with greater than 50% respiratory variability, suggesting right atrial pressure of 3 mmHg. IAS/Shunts: The atrial septum is grossly normal.  LEFT VENTRICLE PLAX 2D LVIDd:         4.00 cm  Diastology LVIDs:         2.60 cm  LV e' lateral:   7.40 cm/s LV PW:         1.20 cm  LV E/e' lateral: 14.5 LV IVS:        1.20 cm  LV e' medial:    7.29 cm/s LVOT diam:     2.00 cm  LV E/e' medial:  14.7 LV SV:         75 LV SV Index:   35 LVOT Area:     3.14 cm  RIGHT VENTRICLE             IVC RV S prime:     11.30 cm/s  IVC diam: 2.00 cm TAPSE (M-mode): 2.6 cm LEFT ATRIUM             Index       RIGHT ATRIUM           Index LA diam:        4.00 cm 1.88 cm/m  RA Area:     13.00 cm LA Vol (A2C):   42.6 ml 20.03 ml/m RA  Volume:   29.50 ml   13.87 ml/m LA Vol (A4C):   52.7 ml 24.78 ml/m LA Biplane Vol: 50.2 ml 23.60 ml/m  AORTIC VALVE LVOT Vmax:   96.70 cm/s LVOT Vmean:  62.700 cm/s LVOT VTI:    0.240 m  AORTA Ao Root diam: 2.70 cm Ao Asc diam:  3.10 cm MITRAL VALVE MV Area (PHT): 3.31 cm     SHUNTS MV Decel Time: 229 msec     Systemic VTI:  0.24 m MV E velocity: 107.00 cm/s  Systemic Diam: 2.00 cm MV A velocity: 101.00 cm/s MV E/A ratio:  1.06 Eleonore Chiquito MD Electronically signed by Eleonore Chiquito MD Signature Date/Time: 07/07/2020/3:27:27 PM    Final    Disposition   Pt is being discharged home today in good condition.  Follow-up Plans & Appointments   Call [facility] at [phone] if any bleeding, swelling or drainage at cath site.  May shower, no tub baths for 48 hours for groin sticks. No lifting over 5 pounds for 3 days.  No Driving for 3 days  Take 1 NTG, under your tongue, while sitting.  If no relief of pain may repeat NTG, one tab every 5 minutes up to 3 tablets total over 15 minutes.  If no relief CALL 911.  If you have dizziness/lightheadness  while taking NTG, stop taking and call 911.        Heart Healthy Diabetic diet   Do not stop asprin or plavix, these meds keep the new stents open, stopping could cause a heart attack    Follow-up Information    Elouise Munroe, MD Follow up on 07/28/2020.   Specialties: Cardiology, Radiology Why: at 8:45 AM with [facility] her PA Contact information: [address] [place] [address] [phone]              Discharge Instructions    Amb Referral to Cardiac Rehabilitation   Complete by: As directed    Diagnosis: Coronary Stents   After initial evaluation and assessments completed: Virtual Based Care may be provided alone or in conjunction with Phase 2 Cardiac Rehab based on patient barriers.: Yes      Discharge Medications   Allergies as of 07/09/2020      Reactions   Other    Hives to an unknown medication given in  2012 at [facility]       Medication List    STOP taking these medications   aspirin EC 81 MG tablet Replaced by: aspirin 81 MG chewable tablet     TAKE these medications   acetaminophen 325 MG tablet Commonly known as: TYLENOL Take 2 tablets (650 mg total) by mouth every 4 (four) hours as needed for headache or mild pain. What changed:   medication strength  how much to take  when to take this  reasons to take this   aspirin 81 MG chewable tablet Chew 1 tablet (81 mg total) by mouth daily. Start taking on: July 10, 2020 Replaces: aspirin EC 81 MG tablet   clopidogrel 75 MG tablet Commonly known as: PLAVIX Take 1 tablet (75 mg total) by mouth daily with breakfast. Start taking on: July 10, 2020 What changed: when to take this   ezetimibe 10 MG tablet Commonly known as: Zetia Take 1 tablet (10 mg total) by mouth daily.   Farxiga 10 MG Tabs tablet Generic drug: dapagliflozin propanediol Take 10 mg by mouth daily.   fosinopril-hydrochlorothiazide 20-12.5 MG tablet Commonly known  as: MONOPRIL-HCT Take 1 tablet by mouth daily.   FreeStyle Libre 14 Day Sensor Misc Apply 1 each topically every 14 (fourteen) days.   FREESTYLE LITE test strip Generic drug: glucose blood 1 each by Other route as needed (check blood glucose).   Insulin Syringes (Disposable) U-100 0.5 ML Misc 30 Units by Does not apply route 2 (two) times daily with a meal.   Lantus SoloStar 100 UNIT/ML Solostar Pen Generic drug: insulin glargine Inject 30 Units into the skin at bedtime.   metoprolol succinate 25 MG 24 hr tablet Commonly known as: TOPROL-XL Take 1 tablet (25 mg total) by mouth daily. Start taking on: July 10, 2020   nitroGLYCERIN 0.4 MG SL tablet Commonly known as: NITROSTAT Place 1 tablet (0.4 mg total) under the tongue every 5 (five) minutes as needed for chest pain.   rosuvastatin 40 MG tablet Commonly known as: CRESTOR Take 1 tablet (40 mg total) by mouth daily.     sertraline 50 MG tablet Commonly known as: Zoloft Take 1 tablet (50 mg total) by mouth at bedtime.          Outstanding Labs/Studies   Lipids in 6 weeks   Duration of Discharge Encounter   Greater than 30 minutes including physician time.  Signed, Cecilie Kicks, NP 07/09/2020, 10:12 AM  Patient seen and examined with Cecilie Kicks NP.  Agree as above, with the following exceptions and changes as noted below. She feels well, no CP. Groin and radial site stable. Groin site with mild hematoma but soft. Gen: NAD, CV: RRR, no murmurs, Lungs: clear, Abd: soft, Extrem: Warm, well perfused, no edema, Neuro/Psych: alert and oriented x 3, normal mood and affect. All available labs, radiology testing, previous records reviewed. We reviewed medications extensively and follow up plan. Discussed precautions. Can be out of work for 7 days for groin site healing. Stable for DC.  Elouise Munroe, MD 07/09/20 10:24 AM

## 2020-07-17 ENCOUNTER — Telehealth: Payer: Self-pay | Admitting: Internal Medicine

## 2020-07-17 NOTE — Telephone Encounter (Signed)
Spoke to patient she stated she has had 2 episodes of chest pain in the past 2 days.Stated this morning she took NTG x 1 with relief.No chest pain at present.Appointment scheduled with Coletta Memos NP 8/26 at 10:45 am.Advised to go to ED if she continues to have chest pain.

## 2020-07-17 NOTE — Telephone Encounter (Signed)
Pt c/o of Chest Pain: STAT if CP now or developed within 24 hours  1. Are you having CP right now? yes  2. Are you experiencing any other symptoms (ex. SOB, nausea, vomiting, sweating)? no  3. How long have you been experiencing CP? Started this morning  4. Is your CP continuous or coming and going? Comes and goes  5. Have you taken Nitroglycerin? no ? Patient said she has chest pain on her left side more toward her arm. She had stents put in 07/07/20 and  was hoping to get in sooner to see a PA than her current f/u appt scheduled for 07/28/20

## 2020-07-17 NOTE — Telephone Encounter (Signed)
Pt took one nitro at 10:41 while waiting to speak to the triage nurse. The patient's pain started to ease off but she will take another if necessary

## 2020-07-23 NOTE — Progress Notes (Signed)
Cardiology Clinic Note   Patient Name: Rachyl Wuebker Date of Encounter: 07/24/2020  Primary Care Provider:  Lin Landsman, MD Primary Cardiologist:  Elouise Munroe, MD  Patient Profile    Lajuana Matte. Poirier 54 year old female presents the clinic today for follow-up evaluation of her benign essential hypertension, and unstable angina.  Past Medical History    Past Medical History:  Diagnosis Date   CAD in native artery 07/09/2020   Coronary artery disease    Diabetes mellitus    GERD (gastroesophageal reflux disease)    Hyperlipemia    Hypertension    S/P angioplasty with stent 07/09/2020   Stroke [facility])    Past Surgical History:  Procedure Laterality Date   CORONARY STENT INTERVENTION N/A 07/08/2020   Procedure: CORONARY STENT INTERVENTION;  Surgeon: Jettie Booze, MD;  Location: [place] [facility];  Service: Cardiovascular;  Laterality: N/A;   INTRAVASCULAR ULTRASOUND/IVUS N/A 07/08/2020   Procedure: Intravascular Ultrasound/IVUS;  Surgeon: Jettie Booze, MD;  Location: [place] [facility];  Service: Cardiovascular;  Laterality: N/A;   LEFT HEART CATH AND CORONARY ANGIOGRAPHY N/A 07/07/2020   Procedure: LEFT HEART CATH AND CORONARY ANGIOGRAPHY;  Surgeon: Lorretta Harp, MD;  Location: [place] [facility];  Service: Cardiovascular;  Laterality: N/A;   None      Allergies  Allergies  Allergen Reactions   Other     Hives to an unknown medication given in 2012 at [place] [facility]    Ms. Loseke has a PMH of unstable angina, CAD status post LHC with PCI and DES x2 to her distal LAD and mid LAD 07/08/2020, proximal RCA 60% stenosis, mid RCA to distal RCA 80% stenosis echocardiogram showed an LVEF of 60-65%.  Her PMH also includes type 2 diabetes, HLD, OSA on CPAP, GERD, morbid obesity and essential hypertension.  She presented to the emergency department with chest pain on 07/06/2020.  She had no prior cardiac evaluation.   She had a positive family history of coronary artery disease with father dying of MI at age 30.  In 2019 she was placed on aspirin and Plavix for CVA.  She indicated that she woke from sleep with a severe chest pain 9 out of 10.  She describes the pain as substernal with radiation to her left arm and neck.  She also describes shortness of breath and lightheadedness.  After review it was noted that she had noted exertional chest pain for the last few months.  Her troponins were initially negative at 74.  Hemoglobin is 11 creatinine was 0.6.  Chest x-ray was negative for acute changes.  Nitroglycerin in the emergency department resolved her chest pain.  Her EKG showed sinus rhythm with no acute ischemic changes and telemetry showed sinus rhythm in 70s.  During that time she was admitted for her CAD risk factors and accelerating angina with plans to perform echocardiogram and cardiac catheterization.  A femoral approach was used for her LHC.  Her echo and cardiac catheterization showed normal LV function.  She was initiated on DAPT (aspirin and clopidogrel)  She contacted nurse triage line on 07/17/2020 and indicated that she had 2 episodes of chest pain on the prior 2 days.  She did take nitroglycerin x1 which provided relief.  She presents the clinic today for follow-up evaluation and states she is breathing much better and her activity tolerance is better compared to what it was previous.  She states that she  did however have some chest discomfort on 07/17/2020.  She stated she noticed the chest discomfort after she had eaten breakfast and lay down.  She described chest discomfort as a burning type sensation.  It was nonradiating.  She states that it was not like the pain that she had before her LAD stenting.  She did take 3 nitroglycerin which relieved her symptoms.  She has not had any recurrent chest pain and it was nonexertional.  Her blood pressure at home has been elevated in the 202R-427 systolic and  06C-37S diastolic.  I will split her Monopril.  We will start her on lisinopril 40 mg continue her HCTZ 12.5 and her metoprolol 25.  I will give her the salty 6 diet sheet, mindfulness stress reduction sheet, information on sleep hygiene, have her start cardiac rehab, and start her on Protonix 20 mg daily.  I have also asked her to keep a blood pressure log and follow-up in 1 month.  Today she denies chest pain, shortness of breath, lower extremity edema, fatigue, palpitations, melena, hematuria, hemoptysis, diaphoresis, weakness, presyncope, syncope, orthopnea, and PND.   Home Medications    Prior to Admission medications   Medication Sig Start Date End Date Taking? Authorizing Provider  acetaminophen (TYLENOL) 325 MG tablet Take 2 tablets (650 mg total) by mouth every 4 (four) hours as needed for headache or mild pain. 07/09/20   Isaiah Serge, NP  aspirin 81 MG chewable tablet Chew 1 tablet (81 mg total) by mouth daily. 07/10/20   Isaiah Serge, NP  clopidogrel (PLAVIX) 75 MG tablet Take 1 tablet (75 mg total) by mouth daily with breakfast. 07/10/20   Isaiah Serge, NP  Continuous Blood Gluc Sensor (FREESTYLE LIBRE 14 DAY SENSOR) MISC Apply 1 each topically every 14 (fourteen) days.  02/19/20   [provider]  ezetimibe (ZETIA) 10 MG tablet Take 1 tablet (10 mg total) by mouth daily. 12/28/18   Frann Rider, NP  FARXIGA 10 MG TABS tablet Take 10 mg by mouth daily. 04/21/20   [provider]  fosinopril-hydrochlorothiazide (MONOPRIL-HCT) 20-12.5 MG tablet Take 1 tablet by mouth daily. 02/12/20   [provider]  glucose blood (FREESTYLE LITE) test strip 1 each by Other route as needed (check blood glucose).  09/04/19   [provider]  Insulin Syringes, Disposable, U-100 0.5 ML MISC 30 Units by Does not apply route 2 (two) times daily with a meal. 08/20/18   Kathi Ludwig, MD  LANTUS SOLOSTAR 100 UNIT/ML Solostar Pen Inject 30 Units into the skin at  bedtime. 05/29/20   [provider]  metoprolol succinate (TOPROL-XL) 25 MG 24 hr tablet Take 1 tablet (25 mg total) by mouth daily. 07/10/20   Isaiah Serge, NP  nitroGLYCERIN (NITROSTAT) 0.4 MG SL tablet Place 1 tablet (0.4 mg total) under the tongue every 5 (five) minutes as needed for chest pain. 07/09/20   Isaiah Serge, NP  rosuvastatin (CRESTOR) 40 MG tablet Take 1 tablet (40 mg total) by mouth daily. 12/27/18   Frann Rider, NP  sertraline (ZOLOFT) 50 MG tablet Take 1 tablet (50 mg total) by mouth at bedtime. 12/27/18   Frann Rider, NP  metFORMIN (GLUCOPHAGE) 1000 MG tablet Take 1,000 mg by mouth 2 (two) times daily with a meal.  10/30/19  [provider]    Family History    Family History  Problem Relation Age of Onset   Diabetes Mother    Hypertension Mother    Breast  cancer Mother    Stroke Mother    Hypertension Father    Heart failure Father    Breast cancer Maternal Grandmother    Stroke Brother    Colon cancer Neg Hx    Stomach cancer Neg Hx    Rectal cancer Neg Hx    Esophageal cancer Neg Hx    Liver cancer Neg Hx    She indicated that her mother is alive. She indicated that her father is deceased. She indicated that her brother is alive. She indicated that her maternal grandmother is deceased. She indicated that the status of her neg hx is unknown.  Social History    Social History   Socioeconomic History   Marital status: Single    Spouse name: Not on file   Number of children: 2   Years of education: Not on file   Highest education level: Not on file  Occupational History   Occupation: [employer]- D[employer]  Tobacco Use   Smoking status: Never Smoker   Smokeless tobacco: Never Used  Scientific laboratory technician Use: Never used  Substance and Sexual Activity   Alcohol use: No   Drug use: No   Sexual activity: Not Currently  Other Topics Concern   Not on file  Social History Narrative   Not on file   Social  Determinants of Health   Financial Resource Strain:    Difficulty of Paying Living Expenses: Not on file  Food Insecurity:    Worried About Charity fundraiser in the Last Year: Not on file   [employer] of Food in the Last Year: Not on file  Transportation Needs:    Lack of Transportation (Medical): Not on file   Lack of Transportation (Non-Medical): Not on file  Physical Activity:    Days of Exercise per Week: Not on file   Minutes of Exercise per Session: Not on file  Stress:    Feeling of Stress : Not on file  Social Connections:    Frequency of Communication with Friends and Family: Not on file   Frequency of Social Gatherings with Friends and Family: Not on file   Attends Religious Services: Not on file   Active Member of Clubs or Organizations: Not on file   Attends Archivist Meetings: Not on file   Marital Status: Not on file  Intimate Partner Violence:    Fear of Current or Ex-Partner: Not on file   Emotionally Abused: Not on file   Physically Abused: Not on file   Sexually Abused: Not on file     Review of Systems    General:  No chills, fever, night sweats or weight changes.  Cardiovascular:  No chest pain, dyspnea on exertion, edema, orthopnea, palpitations, paroxysmal nocturnal dyspnea. Dermatological: No rash, lesions/masses Respiratory: No cough, dyspnea Urologic: No hematuria, dysuria Abdominal:   No nausea, vomiting, diarrhea, bright red blood per rectum, melena, or hematemesis Neurologic:  No visual changes, wkns, changes in mental status. All other systems reviewed and are otherwise negative except as noted above.  Physical Exam    VS:  BP (!) 144/95 (BP Location: Right Arm, Patient Position: Sitting, Cuff Size: Normal)    Pulse 77    Temp (!) 97.5 F (36.4 C)    Ht 5\' 3"  (1.6 m)    Wt 249 lb 3.2 oz (113 kg)    BMI 44.14 kg/m  , BMI Body mass index is 44.14 kg/m. GEN: Well nourished, well developed, in  no acute  distress. HEENT: normal. Neck: Supple, no JVD, carotid bruits, or masses. Cardiac: RRR, no murmurs, rubs, or gallops. No clubbing, cyanosis, edema.  Radials/DP/PT 2+ and equal bilaterally.  Respiratory:  Respirations regular and unlabored, clear to auscultation bilaterally. GI: Soft, nontender, nondistended, BS + x 4. MS: no deformity or atrophy. Skin: warm and dry, no rash. Neuro:  Strength and sensation are intact. Psych: Normal affect.  Accessory Clinical Findings    Recent Labs: 07/09/2020: BUN 11; Creatinine, Ser 0.70; Hemoglobin 10.8; Platelets 339; Potassium 3.6; Sodium 136   Recent Lipid Panel    Component Value Date/Time   CHOL 219 (H) 07/07/2020 0118   CHOL 158 12/27/2018 1044   TRIG 150 (H) 07/07/2020 0118   HDL 36 (L) 07/07/2020 0118   HDL 40 12/27/2018 1044   CHOLHDL 6.1 07/07/2020 0118   VLDL 30 07/07/2020 0118   LDLCALC 153 (H) 07/07/2020 0118   LDLCALC 100 (H) 12/27/2018 1044    ECG personally reviewed by me today-normal sinus rhythm right bundle branch block 77 bpm- No acute changes    Cardiac catheterization 07/07/2020  Prox RCA lesion is 60% stenosed.  Mid RCA to Dist RCA lesion is 80% stenosed.  Prox LAD to Mid LAD lesion is 80% stenosed.  Dist LAD lesion is 80% stenosed.   Newell Frater is a 54 y.o. female  Ms. Trani has significant two-vessel disease in a codominant system including a calcified segmental proximal LAD lesion, mid to distal LAD lesion and distal codominant RCA lesion.  Her LVEDP was normal.  Her LV appeared normal with hand-injection although 2D echo will be obtained.  I used 180 cc of contrast to do the diagnostic because of extreme tortuosity in the brachiocephalic system.  I do not think intervention could be performed safely radially and I suspect she will need femoral access.  In addition, I did use 20 minutes of fluoroscopy time because of her large stature (112 kg).  I suspect the proximal LAD lesion would optimally be  treated with orbital atherectomy for debulking prior to stenting although there is some tortuosity in the proximal ID which may make this technically challenging.  I did take the liberty of loading her with Plavix.  Her cardiac enzymes were negative.  I am going to reheparinize her.  The sheath was removed and a TR band was placed on the right wrist to achieve patent hemostasis.  The patient left the lab in stable condition. Diagnostic Dominance: Co-dominant  Intervention   Cardiac catheterization 07/08/20  Dist LAD lesion is 80% stenosed.  A drug-eluting stent was successfully placed using a STENT RESOLUTE ONYX 2.5X18, postdilated with a 3.0 [place] balloon and optimized wth IVUS.  Post intervention, there is a 0% residual stenosis.  Mid LAD lesion is 80% stenosed. Minimal calcification by IVUS so atherectomy was not required.  A drug-eluting stent was successfully placed using a STENT RESOLUTE ONYX 3.5X22, postdilated with a 4.0  balloon and optimized wth IVUS.  Post intervention, there is a 0% residual stenosis.   Continue aggressive secondary prevention.  I stressed the importance of DAPT.  Diagnostic Dominance: Co-dominant  Intervention      Assessment & Plan   1.  Coronary artery disease-no chest pain today.  Has had occasional episodes of chest discomfort which are relieved by nitroglycerin.  Status post LHC with DES x2 to mid and distal LAD.  Underwent staged PCI due to tortuous brachiocephalic system.  Details above has had 1 episode of chest  discomfort which appeared to be epigastric in nature. Continue aspirin, clopidogrel, ezetimibe, Monopril hydrochlorothiazide, metoprolol, rosuvastatin.   Heart healthy low-sodium diet-salty 6 given Increase physical activity as tolerated  GERD-recent chest discomfort appears to be related to esophageal reflux.  One episode of chest discomfort nonradiating and at rest.  Came on with laying down after eating. Start Protonix 20 mg  daily GERD diet handout given Increase physical activity as tolerated Continue weight loss  Hyperlipidemia-07/07/2020: Cholesterol 219; HDL 36; LDL Cholesterol 153; Triglycerides 150; VLDL 30 Continue ezetimibe, rosuvastatin Heart healthy low-sodium high-fiber diet Increase physical activity as tolerated Repeat lipid panel and LFTs in 4 weeks.  Essential hypertension-BP today 144/95.  Similar control at home. Start lisinopril 40 Continue HCTZ, metoprolol Heart healthy low-sodium diet-salty 6 given Increase physical activity as tolerated Blood pressure log Mindfulness stress reduction sheet given  Type 2 diabetes-A1c 12.3 on 07/06/2020 Continue Farxiga, Lantus, Heart healthy low-sodium diet-salty 6 given Increase physical activity as tolerated  Disposition: Follow-up with me or Dr.Acharya in 1 month.  Jossie Ng. Suella Cogar NP-C    07/24/2020, 11:20 AM [address] [place] [address] Office [phone] Fax [phone]  Notice: This dictation was prepared with Dragon dictation along with smaller phrase technology. Any transcriptional errors that result from this process are unintentional and may not be corrected upon review.

## 2020-07-24 ENCOUNTER — Encounter: Payer: Self-pay | Admitting: General Practice

## 2020-07-24 ENCOUNTER — Other Ambulatory Visit: Payer: Self-pay

## 2020-07-24 ENCOUNTER — Ambulatory Visit (INDEPENDENT_AMBULATORY_CARE_PROVIDER_SITE_OTHER): Payer: 59 | Admitting: General Practice

## 2020-07-24 VITALS — BP 144/95 | HR 77 | Temp 97.5°F | Ht 63.0 in | Wt 249.2 lb

## 2020-07-24 DIAGNOSIS — I10 Essential (primary) hypertension: Secondary | ICD-10-CM | POA: Diagnosis not present

## 2020-07-24 DIAGNOSIS — K219 Gastro-esophageal reflux disease without esophagitis: Secondary | ICD-10-CM

## 2020-07-24 DIAGNOSIS — E7800 Pure hypercholesterolemia, unspecified: Secondary | ICD-10-CM | POA: Diagnosis not present

## 2020-07-24 DIAGNOSIS — E0822 Diabetes mellitus due to underlying condition with diabetic chronic kidney disease: Secondary | ICD-10-CM

## 2020-07-24 DIAGNOSIS — Z79899 Other long term (current) drug therapy: Secondary | ICD-10-CM

## 2020-07-24 DIAGNOSIS — I2510 Atherosclerotic heart disease of native coronary artery without angina pectoris: Secondary | ICD-10-CM

## 2020-07-24 MED ORDER — LISINOPRIL 40 MG PO TABS
40.0000 mg | ORAL_TABLET | Freq: Every day | ORAL | 3 refills | Status: DC
Start: 2020-07-24 — End: 2020-11-07

## 2020-07-24 MED ORDER — HYDROCHLOROTHIAZIDE 25 MG PO TABS: 12.5000 mg | ORAL_TABLET | Freq: Every day | ORAL | 6 refills | Status: DC

## 2020-07-24 MED ORDER — PANTOPRAZOLE SODIUM 20 MG PO TBEC
20.0000 mg | DELAYED_RELEASE_TABLET | Freq: Every day | ORAL | 6 refills | Status: DC
Start: 2020-07-24 — End: 2021-02-13

## 2020-07-24 NOTE — Patient Instructions (Addendum)
Medication Instructions:  STOP FOSINOPRIL-HCTZ  START LISINOPRIL 20MG  DAILY  START HYDROCHLOROTHIAZIDE 12.5MG  DAILY  START PROTONIX 20MG  DAILY *If you need a refill on your cardiac medications before your next appointment, please call your pharmacy*  Special Instructions TAKE AND LOG YOUR BLOOD PRESSURE DAILY AND BRING TO YOUR FOLLOW UP APPOINTMENT  PLEASE READ AND FOLLOW SALTY 6-ATTACHED  PLEASE READ AND FOLLOW SLEEP TIPS-ATTACHED  PLEASE READ AND FOLLOW MINDFULNESS STRESS REDUCTION-ATTACHED  OK TO START CARDIAC REHAB  GET NON-FASTING LAN IN 1 WEEK  Follow-Up: Your next appointment:  1 month(s)  In Person with Cindy Kaiser, MD -[place], FNP-C  At [facility], you and your health needs are our priority.  As part of our continuing mission to provide you with exceptional heart care, we have created designated Provider Care Teams.  These Care Teams include your primary Cardiologist (physician) and Advanced Practice Providers (APPs -  Physician Assistants and Nurse Practitioners) who all work together to provide you with the care you need, when you need it.       Mindfulness-Based Stress Reduction Mindfulness-based stress reduction (MBSR) is a program that helps people learn to practice mindfulness. Mindfulness is the practice of intentionally paying attention to the present moment. It can be learned and practiced through techniques such as education, breathing exercises, meditation, and yoga. MBSR includes several mindfulness techniques in one program. MBSR works best when you understand the treatment, are willing to try new things, and can commit to spending time practicing what you learn. MBSR training may include learning about:  How your emotions, thoughts, and reactions affect your body.  New ways to respond to things that cause negative thoughts to start (triggers).  How to notice your thoughts and let go of them.  Practicing awareness of everyday things  that you normally do without thinking.  The techniques and goals of different types of meditation. What are the benefits of MBSR? MBSR can have many benefits, which include helping you to:  Develop self-awareness. This refers to knowing and understanding yourself.  Learn skills and attitudes that help you to participate in your own health care.  Learn new ways to care for yourself.  Be more accepting about how things are, and let things go.  Be less judgmental and approach things with an open mind.  Be patient with yourself and trust yourself more. MBSR has also been shown to:  Reduce negative emotions, such as depression and anxiety.  Improve memory and focus.  Change how you sense and approach pain.  Boost your body's ability to fight infections.  Help you connect better with other people.  Improve your sense of well-being. Follow these instructions at home:   Find a local in-person or online MBSR program.  Set aside some time regularly for mindfulness practice.  Find a mindfulness practice that works best for you. This may include one or more of the following: ? Meditation. Meditation involves focusing your mind on a certain thought or activity. ? Breathing awareness exercises. These help you to stay present by focusing on your breath. ? Body scan. For this practice, you lie down and pay attention to each part of your body from head to toe. You can identify tension and soreness and intentionally relax parts of your body. ? Yoga. Yoga involves stretching and breathing, and it can improve your ability to move and be flexible. It can also provide an experience of testing your body's limits, which can help you release stress. ? Mindful eating.  This way of eating involves focusing on the taste, texture, color, and smell of each bite of food. Because this slows down eating and helps you feel full sooner, it can be an important part of a weight-loss plan.  Find a podcast or  recording that provides guidance for breathing awareness, body scan, or meditation exercises. You can listen to these any time when you have a free moment to rest without distractions.  Follow your treatment plan as told by your health care provider. This may include taking regular medicines and making changes to your diet or lifestyle as recommended. How to practice mindfulness To do a basic awareness exercise:  Find a comfortable place to sit.  Pay attention to the present moment. Observe your thoughts, feelings, and surroundings just as they are.  Avoid placing judgment on yourself, your feelings, or your surroundings. Make note of any judgment that comes up, and let it go.  Your mind may wander, and that is okay. Make note of when your thoughts drift, and return your attention to the present moment. To do basic mindfulness meditation:  Find a comfortable place to sit. This may include a stable chair or a firm floor cushion. ? Sit upright with your back straight. Let your arms fall next to your side with your hands resting on your legs. ? If sitting in a chair, rest your feet flat on the floor. ? If sitting on a cushion, cross your legs in front of you.  Keep your head in a neutral position with your chin dropped slightly. Relax your jaw and rest the tip of your tongue on the roof of your mouth. Drop your gaze to the floor. You can close your eyes if you like.  Breathe normally and pay attention to your breath. Feel the air moving in and out of your nose. Feel your belly expanding and relaxing with each breath.  Your mind may wander, and that is okay. Make note of when your thoughts drift, and return your attention to your breath.  Avoid placing judgment on yourself, your feelings, or your surroundings. Make note of any judgment or feelings that come up, let them go, and bring your attention back to your breath.  When you are ready, lift your gaze or open your eyes. Pay attention to  how your body feels after the meditation. Where to find more information You can find more information about MBSR from:  Your health care provider.  Community-based meditation centers or programs.  Programs offered near you. Summary  Mindfulness-based stress reduction (MBSR) is a program that teaches you how to intentionally pay attention to the present moment. It is used with other treatments to help you cope better with daily stress, emotions, and pain.  MBSR focuses on developing self-awareness, which allows you to respond to life stress without judgment or negative emotions.  MBSR programs may involve learning different mindfulness practices, such as breathing exercises, meditation, yoga, body scan, or mindful eating. Find a mindfulness practice that works best for you, and set aside time for it on a regular basis. This information is not intended to replace advice given to you by your health care provider. Make sure you discuss any questions you have with your health care provider. Document Revised: 10/28/2017 Document Reviewed: 03/24/2017 Elsevier Patient Education  2020 [place] Sleep Information, Adult Quality sleep is important for your mental and physical health. It also improves your quality of life. Quality sleep means you:  Are asleep  for most of the time you are in bed.  Fall asleep within 30 minutes.  Wake up no more than once a night.  Are awake for no longer than 20 minutes if you do wake up during the night. Most adults need 7-8 hours of quality sleep each night. How can poor sleep affect me? If you do not get enough quality sleep, you may have:  Mood swings.  Daytime sleepiness.  Confusion.  Decreased reaction time.  Sleep disorders, such as insomnia and sleep apnea.  Difficulty with: ? Solving problems. ? Coping with stress. ? Paying attention. These issues may affect your performance and productivity at work, school, and at home.  Lack of sleep may also put you at higher risk for accidents, suicide, and risky behaviors. If you do not get quality sleep you may also be at higher risk for several health problems, including:  Infections.  Type 2 diabetes.  Heart disease.  High blood pressure.  Obesity.  Worsening of long-term conditions, like arthritis, kidney disease, depression, Parkinson's disease, and epilepsy. What actions can I take to get more quality sleep?      Stick to a sleep schedule. Go to sleep and wake up at about the same time each day. Do not try to sleep less on weekdays and make up for lost sleep on weekends. This does not work.  Try to get about 30 minutes of exercise on most days. Do not exercise 2-3 hours before going to bed.  Limit naps during the day to 30 minutes or less.  Do not use any products that contain nicotine or tobacco, such as cigarettes or e-cigarettes. If you need help quitting, ask your health care provider.  Do not drink caffeinated beverages for at least 8 hours before going to bed. Coffee, tea, and some sodas contain caffeine.  Do not drink alcohol close to bedtime.  Do not eat large meals close to bedtime.  Do not take naps in the late afternoon.  Try to get at least 30 minutes of sunlight every day. Morning sunlight is best.  Make time to relax before bed. Reading, listening to music, or taking a hot bath promotes quality sleep.  Make your bedroom a place that promotes quality sleep. Keep your bedroom dark, quiet, and at a comfortable room temperature. Make sure your bed is comfortable. Take out sleep distractions like TV, a computer, smartphone, and bright lights.  If you are lying awake in bed for longer than 20 minutes, get up and do a relaxing activity until you feel sleepy.  Work with your health care provider to treat medical conditions that may affect sleeping, such as: ? Nasal obstruction. ? Snoring. ? Sleep apnea and other sleep disorders.  Talk  to your health care provider if you think any of your prescription medicines may cause you to have difficulty falling or staying asleep.  If you have sleep problems, talk with a sleep consultant. If you think you have a sleep disorder, talk with your health care provider about getting evaluated by a specialist. Where to find more information  [place] website: https://sleepfoundation.org  National Heart, Lung, and [place]): http://www.saunders.info/.pdf  Centers for Disease Control and Prevention (CDC): LearningDermatology.pl Contact a health care provider if you:  Have trouble getting to sleep or staying asleep.  Often wake up very early in the morning and cannot get back to sleep.  Have daytime sleepiness.  Have daytime sleep attacks of suddenly falling asleep and sudden muscle  weakness (narcolepsy).  Have a tingling sensation in your legs with a strong urge to move your legs (restless legs syndrome).  Stop breathing briefly during sleep (sleep apnea).  Think you have a sleep disorder or are taking a medicine that is affecting your quality of sleep. Summary  Most adults need 7-8 hours of quality sleep each night.  Getting enough quality sleep is an important part of health and well-being.  Make your bedroom a place that promotes quality sleep and avoid things that may cause you to have poor sleep, such as alcohol, caffeine, smoking, and large meals.  Talk to your health care provider if you have trouble falling asleep or staying asleep. This information is not intended to replace advice given to you by your health care provider. Make sure you discuss any questions you have with your health care provider. Document Revised: 02/22/2018 Document Reviewed: 02/22/2018 Elsevier Patient Education  [place].

## 2020-07-28 ENCOUNTER — Ambulatory Visit: Payer: 59 | Admitting: Student

## 2020-08-01 ENCOUNTER — Telehealth (HOSPITAL_COMMUNITY): Payer: Self-pay

## 2020-08-01 NOTE — Telephone Encounter (Signed)
Called patient to see if she was interested in participating in the Cardiac Rehab Program. Patient stated yes. Patient will come in for orientation on 08/26/20 @ 8AM and will attend the 10:45AM exercise class. Went over insurance, patient verbalized understanding.   Mailed letter.

## 2020-08-20 ENCOUNTER — Encounter (HOSPITAL_COMMUNITY)
Admission: RE | Admit: 2020-08-20 | Discharge: 2020-08-20 | Disposition: A | Discharge status: Home / Self Care | Payer: 59 | Source: Ambulatory Visit | Attending: Internal Medicine | Admitting: Internal Medicine

## 2020-08-20 ENCOUNTER — Other Ambulatory Visit: Payer: Self-pay

## 2020-08-20 ENCOUNTER — Telehealth (HOSPITAL_COMMUNITY): Payer: Self-pay | Admitting: Pharmacist

## 2020-08-20 DIAGNOSIS — Z95820 Peripheral vascular angioplasty status with implants and grafts: Secondary | ICD-10-CM | POA: Insufficient documentation

## 2020-08-20 NOTE — Telephone Encounter (Signed)
Cardiac Rehab Medication Review by a Pharmacist  Does the patient  feel that his/her medications are working for him/her?  yes  Has the patient been experiencing any side effects to the medications prescribed?  no  Does the patient measure his/her own blood pressure or blood glucose at home?  Yes, most recent blood pressure was 145/~70 and most recent blood sugar was 230 fasting.   Does the patient have any problems obtaining medications due to transportation or finances?   no  Understanding of regimen: good Understanding of indications: good Potential of compliance: good  Pharmacist Intervention: none  Cindy [name], PharmD PGY-1 Ambulatory Care Pharmacy Resident 08/20/2020 2:40 PM

## 2020-08-20 NOTE — Progress Notes (Signed)
Cardiac Rehab Telephone Note:  Successful telephone encounter to Cindy [name] to confirm Cardiac Rehab orientation appointment for 08/26/20 at 8:00. Nursing assessment completed. Patient questions answered. Instructions for appointment provided. Patient screening for Covid-19 negative.  Ms. Devinney did states she was having mild symptoms of anxiety, depression, and stress related to her health. She is encouraged to contact PCP to discuss symptoms. Patient verbalized understanding.  Lurie Mullane E. Rollene Rotunda RN, BSN [place]. [facility]  Cardiac and Pulmonary Rehabilitation Phone: [phone] Fax: [phone]

## 2020-08-26 ENCOUNTER — Encounter (HOSPITAL_COMMUNITY): Payer: Self-pay

## 2020-08-26 ENCOUNTER — Ambulatory Visit (HOSPITAL_COMMUNITY): Payer: 59

## 2020-08-26 ENCOUNTER — Ambulatory Visit: Payer: 59 | Admitting: Internal Medicine

## 2020-08-26 DIAGNOSIS — Z95820 Peripheral vascular angioplasty status with implants and grafts: Secondary | ICD-10-CM

## 2020-08-26 NOTE — Progress Notes (Signed)
Cardiac Rehab Note:  Patient presented to her cardiac rehab orientation appointment as scheduled. Upon arrival, patient revealed during covid screening she had recently been in contact with someone positive for Covid-19. Patient was tested 08/24/20 but has not received results. Patient is informed that her orientation appointment will be cancelled and rescheduled once negative result is documented. Patient is fully vaccinated for Covid-19 however unaware that she may display cold or allergy symptoms with positive infection. Patient is provided department contact information and encouraged to call with results. She will be rescheduled for CR orientation at that time. Patient verbalized understanding.  Aniketh Huberty E. Rollene Rotunda RN, BSN [place]. [facility]  Cardiac and Pulmonary Rehabilitation Phone: [phone] Fax: [phone]

## 2020-08-27 ENCOUNTER — Telehealth (HOSPITAL_COMMUNITY): Payer: Self-pay | Admitting: *Deleted

## 2020-08-27 NOTE — Telephone Encounter (Signed)
Attempted to call pt for results of her covid testing.  Awaiting test results so that her cardiac rehab orientation appt can be rescheduled.  Unable to leave voicemail. Cherre Huger, BSN Cardiac and Training and development officer

## 2020-08-29 ENCOUNTER — Encounter (HOSPITAL_COMMUNITY): Payer: Self-pay | Admitting: *Deleted

## 2020-09-01 ENCOUNTER — Telehealth (HOSPITAL_COMMUNITY): Payer: Self-pay

## 2020-09-01 ENCOUNTER — Telehealth (HOSPITAL_COMMUNITY): Payer: Self-pay | Admitting: Pharmacist

## 2020-09-01 ENCOUNTER — Ambulatory Visit (HOSPITAL_COMMUNITY): Payer: 59

## 2020-09-01 NOTE — Telephone Encounter (Signed)
Pt returned CR phone call and stated her test results came back negative. Patient will come in for orientation on 09/02/20 @ 2PM and will attend the 10:45AM exercise class.

## 2020-09-01 NOTE — Telephone Encounter (Signed)
Attempted to call patient in regards to Cardiac Rehab - LM on VM 

## 2020-09-02 ENCOUNTER — Encounter (HOSPITAL_COMMUNITY): Payer: Self-pay

## 2020-09-02 ENCOUNTER — Encounter (HOSPITAL_COMMUNITY)
Admission: RE | Admit: 2020-09-02 | Discharge: 2020-09-02 | Disposition: A | Discharge status: Home / Self Care | Payer: 59 | Source: Ambulatory Visit | Attending: Internal Medicine | Admitting: Internal Medicine

## 2020-09-02 ENCOUNTER — Other Ambulatory Visit: Payer: Self-pay

## 2020-09-02 VITALS — BP 104/70 | HR 92 | Ht 63.75 in | Wt 245.8 lb

## 2020-09-02 DIAGNOSIS — Z95820 Peripheral vascular angioplasty status with implants and grafts: Secondary | ICD-10-CM | POA: Insufficient documentation

## 2020-09-02 DIAGNOSIS — Z955 Presence of coronary angioplasty implant and graft: Secondary | ICD-10-CM | POA: Insufficient documentation

## 2020-09-02 NOTE — Progress Notes (Addendum)
Cardiac Individual Treatment Plan  Patient Details  Name: Cindy [name] MRN: [id number] Date of Birth: [birth year] Referring Provider:     CARDIAC REHAB PHASE II ORIENTATION from 09/02/2020 in [place]  Referring Provider Elouise Munroe, MD      Initial Encounter Date:    CARDIAC REHAB PHASE II ORIENTATION from 09/02/2020 in [place]  Date 09/02/20      Visit Diagnosis: S/P DES LAD x 2  07/08/20  Patient's Home Medications on Admission:  Current Outpatient Medications:  .  acetaminophen (TYLENOL) 325 MG tablet, Take 2 tablets (650 mg total) by mouth every 4 (four) hours as needed for headache or mild pain., Disp: , Rfl:  .  aspirin 81 MG chewable tablet, Chew 1 tablet (81 mg total) by mouth daily., Disp: , Rfl:  .  clopidogrel (PLAVIX) 75 MG tablet, Take 1 tablet (75 mg total) by mouth daily with breakfast., Disp: 30 tablet, Rfl: 6 .  Continuous Blood Gluc Sensor (FREESTYLE LIBRE 14 DAY SENSOR) MISC, Apply 1 each topically every 14 (fourteen) days. , Disp: , Rfl:  .  ezetimibe (ZETIA) 10 MG tablet, Take 1 tablet (10 mg total) by mouth daily., Disp: 30 tablet, Rfl: 3 .  FARXIGA 10 MG TABS tablet, Take 10 mg by mouth daily., Disp: , Rfl:  .  glucose blood (FREESTYLE LITE) test strip, 1 each by Other route as needed (check blood glucose). , Disp: , Rfl:  .  hydrochlorothiazide (HYDRODIURIL) 25 MG tablet, Take 0.5 tablets (12.5 mg total) by mouth daily., Disp: 15 tablet, Rfl: 6 .  Insulin Syringes, Disposable, U-100 0.5 ML MISC, 30 Units by Does not apply route 2 (two) times daily with a meal., Disp: 100 each, Rfl: 3 .  LANTUS SOLOSTAR 100 UNIT/ML Solostar Pen, Inject 30 Units into the skin at bedtime., Disp: , Rfl:  .  lisinopril (ZESTRIL) 40 MG tablet, Take 1 tablet (40 mg total) by mouth daily., Disp: 30 tablet, Rfl: 3 .  metoprolol succinate (TOPROL-XL) 25 MG 24 hr tablet, Take 1 tablet (25 mg total) by mouth  daily., Disp: 30 tablet, Rfl: 6 .  nitroGLYCERIN (NITROSTAT) 0.4 MG SL tablet, Place 1 tablet (0.4 mg total) under the tongue every 5 (five) minutes as needed for chest pain., Disp: 25 tablet, Rfl: 4 .  NOVOLOG FLEXPEN RELION 100 UNIT/ML FlexPen, Inject 30 Units into the skin daily before supper., Disp: , Rfl:  .  pantoprazole (PROTONIX) 20 MG tablet, Take 1 tablet (20 mg total) by mouth daily., Disp: 30 tablet, Rfl: 6 .  rosuvastatin (CRESTOR) 40 MG tablet, Take 1 tablet (40 mg total) by mouth daily., Disp: 90 tablet, Rfl: 0 .  sertraline (ZOLOFT) 50 MG tablet, Take 1 tablet (50 mg total) by mouth at bedtime., Disp: 90 tablet, Rfl: 0  Past Medical History: Past Medical History:  Diagnosis Date  . CAD in native artery 07/09/2020  . Coronary artery disease   . Diabetes mellitus   . GERD (gastroesophageal reflux disease)   . Hyperlipemia   . Hypertension   . S/P angioplasty with stent 07/09/2020  . Stroke [facility])     Tobacco Use: Social History   Tobacco Use  Smoking Status Never Smoker  Smokeless Tobacco Never Used    Labs: Recent Review Flowsheet Data    Labs for ITP Cardiac and Pulmonary Rehab Latest Ref Rng & Units 08/18/2018 08/19/2018 12/27/2018 07/06/2020 07/07/2020   Cholestrol 0 - 200 mg/dL -  186 158 - 219(H)   LDLCALC 0 - 99 mg/dL - 126(H) 100(H) - 153(H)   HDL >40 mg/dL - 32(L) 40 - 36(L)   Trlycerides <150 mg/dL - 140 91 - 150(H)   Hemoglobin A1c 4.8 - 5.6 % - 11.7(H) 11.7(H) 12.3(H) -   TCO2 22 - 32 mmol/L 28 - - - -      Capillary Blood Glucose: Lab Results  Component Value Date   GLUCAP 258 (H) 07/09/2020   GLUCAP 246 (H) 07/08/2020   GLUCAP 221 (H) 07/08/2020   GLUCAP 256 (H) 07/08/2020   GLUCAP 302 (H) 07/08/2020     Exercise Target Goals: Exercise Program Goal: Individual exercise prescription set using results from initial 6 min walk test and THRR while considering  patient's activity barriers and safety.   Exercise Prescription Goal: Starting with  aerobic activity 30 plus minutes a day, 3 days per week for initial exercise prescription. Provide home exercise prescription and guidelines that participant acknowledges understanding prior to discharge.  Activity Barriers & Risk Stratification:  Activity Barriers & Cardiac Risk Stratification - 09/02/20 1330      Activity Barriers & Cardiac Risk Stratification   Activity Barriers Arthritis;Back Problems;Assistive Device;Other (comment)    Comments Chronic low back pain, neuropathy in both feet.    Cardiac Risk Stratification Moderate           6 Minute Walk:  6 Minute Walk    Row Name 09/02/20 1341         6 Minute Walk   Phase Initial     Distance 1150 feet     Walk Time 6 minutes     # of Rest Breaks 0     MPH 2.18     METS 2.7     RPE 11     Perceived Dyspnea  1     VO2 Peak 9.44     Symptoms Yes (comment)     Comments Patient c/o mild SOB during walk, 1/4 on the dyspnea scale. During the recovery patient's BP was 94/62, and patient c/o feeling lightheaded. Water given. Recheck BP was 108/73 seated, 103/72 standing, and patient was asymptomatic.     Resting HR 92 bpm     Resting BP 104/70     Resting Oxygen Saturation  100 %     Exercise Oxygen Saturation  during 6 min walk 97 %     Max Ex. HR 109 bpm     Max Ex. BP 131/83     2 Minute Post BP 94/62            Oxygen Initial Assessment:   Oxygen Re-Evaluation:   Oxygen Discharge (Final Oxygen Re-Evaluation):   Initial Exercise Prescription:  Initial Exercise Prescription - 09/02/20 1400      Date of Initial Exercise RX and Referring Provider   Date 09/02/20    Referring Provider Elouise Munroe, MD    Expected Discharge Date 10/31/20      Recumbant Bike   Level 1.5    Watts 15    Minutes 15    METs 2.43      NuStep   Level 2    SPM 85    Minutes 15    METs 2      Prescription Details   Frequency (times per week) 3    Duration Progress to 30 minutes of continuous aerobic without  signs/symptoms of physical distress      Intensity   THRR 40-80% of Max  Heartrate 66-132    Ratings of Perceived Exertion 11-13    Perceived Dyspnea 0-4      Progression   Progression Continue to progress workloads to maintain intensity without signs/symptoms of physical distress.      Resistance Training   Training Prescription Yes    Weight 3 lbs    Reps 10-15           Perform Capillary Blood Glucose checks as needed.  Exercise Prescription Changes:   Exercise Comments:   Exercise Goals and Review:  Exercise Goals    Row Name 09/02/20 1332             Exercise Goals   Increase Physical Activity Yes       Intervention Provide advice, education, support and counseling about physical activity/exercise needs.;Develop an individualized exercise prescription for aerobic and resistive training based on initial evaluation findings, risk stratification, comorbidities and participant's personal goals.       Expected Outcomes Short Term: Attend rehab on a regular basis to increase amount of physical activity.;Long Term: Exercising regularly at least 3-5 days a week.;Long Term: Add in home exercise to make exercise part of routine and to increase amount of physical activity.       Increase Strength and Stamina Yes       Intervention Provide advice, education, support and counseling about physical activity/exercise needs.;Develop an individualized exercise prescription for aerobic and resistive training based on initial evaluation findings, risk stratification, comorbidities and participant's personal goals.       Expected Outcomes Short Term: Increase workloads from initial exercise prescription for resistance, speed, and METs.;Short Term: Perform resistance training exercises routinely during rehab and add in resistance training at home;Long Term: Improve cardiorespiratory fitness, muscular endurance and strength as measured by increased METs and functional capacity (6MWT)        Able to understand and use rate of perceived exertion (RPE) scale Yes       Intervention Provide education and explanation on how to use RPE scale       Expected Outcomes Short Term: Able to use RPE daily in rehab to express subjective intensity level;Long Term:  Able to use RPE to guide intensity level when exercising independently       Knowledge and understanding of Target Heart Rate Range (THRR) Yes       Intervention Provide education and explanation of THRR including how the numbers were predicted and where they are located for reference       Expected Outcomes Short Term: Able to state/look up THRR;Long Term: Able to use THRR to govern intensity when exercising independently;Short Term: Able to use daily as guideline for intensity in rehab       Able to check pulse independently Yes       Intervention Provide education and demonstration on how to check pulse in carotid and radial arteries.;Review the importance of being able to check your own pulse for safety during independent exercise       Expected Outcomes Short Term: Able to explain why pulse checking is important during independent exercise;Long Term: Able to check pulse independently and accurately       Understanding of Exercise Prescription Yes       Intervention Provide education, explanation, and written materials on patient's individual exercise prescription       Expected Outcomes Short Term: Able to explain program exercise prescription;Long Term: Able to explain home exercise prescription to exercise independently  Exercise Goals Re-Evaluation :    Discharge Exercise Prescription (Final Exercise Prescription Changes):   Nutrition:  Target Goals: Understanding of nutrition guidelines, daily intake of sodium 1500mg , cholesterol 200mg , calories 30% from fat and 7% or less from saturated fats, daily to have 5 or more servings of fruits and vegetables.  Biometrics:  Pre Biometrics - 09/02/20 1330      Pre  Biometrics   Height 5' 3.75" (1.619 m)    Waist Circumference 51.5 inches    Hip Circumference 52.5 inches    Waist to Hip Ratio 0.98 %    BMI (Calculated) 42.54    Triceps Skinfold 27 mm    % Body Fat 51.9 %    Grip Strength 23.5 kg    Flexibility 8 in    Single Leg Stand 4.56 seconds            Nutrition Therapy Plan and Nutrition Goals:   Nutrition Assessments:   Nutrition Goals Re-Evaluation:   Nutrition Goals Discharge (Final Nutrition Goals Re-Evaluation):   Psychosocial: Target Goals: Acknowledge presence or absence of significant depression and/or stress, maximize coping skills, provide positive support system. Participant is able to verbalize types and ability to use techniques and skills needed for reducing stress and depression.  Initial Review & Psychosocial Screening:  Initial Psych Review & Screening - 09/02/20 1546      Initial Review   Current issues with Current Stress Concerns    Source of Stress Concerns Chronic Illness      Family Dynamics   Good Support System? Yes   Cindy [name] has her daughter for support     Barriers   Psychosocial barriers to participate in program The patient should benefit from training in stress management and relaxation.      Screening Interventions   Interventions Encouraged to exercise;To provide support and resources with identified psychosocial needs;Provide feedback about the scores to participant    Expected Outcomes Long Term Goal: Stressors or current issues are controlled or eliminated.;Short Term goal: Identification and review with participant of any Quality of Life or Depression concerns found by scoring the questionnaire.;Long Term goal: The participant improves quality of Life and PHQ9 Scores as seen by post scores and/or verbalization of changes           Quality of Life Scores:  Quality of Life - 09/02/20 1503      Quality of Life   Select Quality of Life      Quality of Life Scores   Health/Function  Pre 25.17 %    Socioeconomic Pre 30 %    Psych/Spiritual Pre 29.14 %    Family Pre 30 %    GLOBAL Pre 27.69 %          Scores of 19 and below usually indicate a poorer quality of life in these areas.  A difference of  2-3 points is a clinically meaningful difference.  A difference of 2-3 points in the total score of the Quality of Life Index has been associated with significant improvement in overall quality of life, self-image, physical symptoms, and general health in studies assessing change in quality of life.  PHQ-9: Recent Review Flowsheet Data    Depression screen [facility] 2/9 09/02/2020   Decreased Interest 0   Down, Depressed, Hopeless 0   PHQ - 2 Score 0     Interpretation of Total Score  Total Score Depression Severity:  1-4 = Minimal depression, 5-9 = Mild depression, 10-14 = Moderate depression, 15-19 = Moderately  severe depression, 20-27 = Severe depression   Psychosocial Evaluation and Intervention:   Psychosocial Re-Evaluation:   Psychosocial Discharge (Final Psychosocial Re-Evaluation):   Vocational Rehabilitation: Provide vocational rehab assistance to qualifying candidates.   Vocational Rehab Evaluation & Intervention:  Vocational Rehab - 09/02/20 1549      Initial Vocational Rehab Evaluation & Intervention   Assessment shows need for Vocational Rehabilitation No   Cindy [name] is currently working and does not need vocational rehab at this time          Education: Education Goals: Education classes will be provided on a weekly basis, covering required topics. Participant will state understanding/return demonstration of topics presented.  Learning Barriers/Preferences:  Learning Barriers/Preferences - 09/02/20 1548      Learning Barriers/Preferences   Learning Barriers Sight;Exercise Concerns   wears glasses reports feeling ligthheaded at times   Learning Preferences None           Education Topics: Hypertension, Hypertension Reduction -Define heart  disease and high blood pressure. Discus how high blood pressure affects the body and ways to reduce high blood pressure.   Exercise and Your Heart -Discuss why it is important to exercise, the FITT principles of exercise, normal and abnormal responses to exercise, and how to exercise safely.   Angina -Discuss definition of angina, causes of angina, treatment of angina, and how to decrease risk of having angina.   Cardiac Medications -Review what the following cardiac medications are used for, how they affect the body, and side effects that may occur when taking the medications.  Medications include Aspirin, Beta blockers, calcium channel blockers, ACE Inhibitors, angiotensin receptor blockers, diuretics, digoxin, and antihyperlipidemics.   Congestive Heart Failure -Discuss the definition of CHF, how to live with CHF, the signs and symptoms of CHF, and how keep track of weight and sodium intake.   Heart Disease and Intimacy -Discus the effect sexual activity has on the heart, how changes occur during intimacy as we age, and safety during sexual activity.   Smoking Cessation / COPD -Discuss different methods to quit smoking, the health benefits of quitting smoking, and the definition of COPD.   Nutrition I: Fats -Discuss the types of cholesterol, what cholesterol does to the heart, and how cholesterol levels can be controlled.   Nutrition II: Labels -Discuss the different components of food labels and how to read food label   Heart Parts/Heart Disease and PAD -Discuss the anatomy of the heart, the pathway of blood circulation through the heart, and these are affected by heart disease.   Stress I: Signs and Symptoms -Discuss the causes of stress, how stress may lead to anxiety and depression, and ways to limit stress.   Stress II: Relaxation -Discuss different types of relaxation techniques to limit stress.   Warning Signs of Stroke / TIA -Discuss definition of a stroke,  what the signs and symptoms are of a stroke, and how to identify when someone is having stroke.   Knowledge Questionnaire Score:  Knowledge Questionnaire Score - 09/02/20 1454      Knowledge Questionnaire Score   Pre Score 21/24           Core Components/Risk Factors/Patient Goals at Admission:  Personal Goals and Risk Factors at Admission - 09/02/20 1328      Core Components/Risk Factors/Patient Goals on Admission    Weight Management Obesity;Yes;Weight Loss    Intervention Weight Management/Obesity: Establish reasonable short term and long term weight goals.;Obesity: Provide education and appropriate resources to help participant work on  and attain dietary goals.    Admit Weight 245 lb 13 oz (111.5 kg)    Expected Outcomes Short Term: Continue to assess and modify interventions until short term weight is achieved;Long Term: Adherence to nutrition and physical activity/exercise program aimed toward attainment of established weight goal;Weight Loss: Understanding of general recommendations for a balanced deficit meal plan, which promotes 1-2 lb weight loss per week and includes a negative energy balance of [phone] kcal/d;Understanding recommendations for meals to include 15-35% energy as protein, 25-35% energy from fat, 35-60% energy from carbohydrates, less than 200mg  of dietary cholesterol, 20-35 gm of total fiber daily;Understanding of distribution of calorie intake throughout the day with the consumption of 4-5 meals/snacks    Diabetes Yes    Intervention Provide education about signs/symptoms and action to take for hypo/hyperglycemia.;Provide education about proper nutrition, including hydration, and aerobic/resistive exercise prescription along with prescribed medications to achieve blood glucose in normal ranges: Fasting glucose 65-99 mg/dL    Expected Outcomes Short Term: Participant verbalizes understanding of the signs/symptoms and immediate care of hyper/hypoglycemia, proper foot  care and importance of medication, aerobic/resistive exercise and nutrition plan for blood glucose control.;Long Term: Attainment of HbA1C < 7%.    Hypertension Yes    Intervention Provide education on lifestyle modifcations including regular physical activity/exercise, weight management, moderate sodium restriction and increased consumption of fresh fruit, vegetables, and low fat dairy, alcohol moderation, and smoking cessation.;Monitor prescription use compliance.    Expected Outcomes Short Term: Continued assessment and intervention until BP is < 140/63mm HG in hypertensive participants. < 130/39mm HG in hypertensive participants with diabetes, heart failure or chronic kidney disease.;Long Term: Maintenance of blood pressure at goal levels.    Lipids Yes    Intervention Provide education and support for participant on nutrition & aerobic/resistive exercise along with prescribed medications to achieve LDL 70mg , HDL >40mg .    Expected Outcomes Short Term: Participant states understanding of desired cholesterol values and is compliant with medications prescribed. Participant is following exercise prescription and nutrition guidelines.;Long Term: Cholesterol controlled with medications as prescribed, with individualized exercise RX and with personalized nutrition plan. Value goals: LDL < 70mg , HDL > 40 mg.    Stress Yes    Intervention Offer individual and/or small group education and counseling on adjustment to heart disease, stress management and health-related lifestyle change. Teach and support self-help strategies.;Refer participants experiencing significant psychosocial distress to appropriate mental health specialists for further evaluation and treatment. When possible, include family members and significant others in education/counseling sessions.    Expected Outcomes Short Term: Participant demonstrates changes in health-related behavior, relaxation and other stress management skills, ability to  obtain effective social support, and compliance with psychotropic medications if prescribed.;Long Term: Emotional wellbeing is indicated by absence of clinically significant psychosocial distress or social isolation.           Core Components/Risk Factors/Patient Goals Review:    Core Components/Risk Factors/Patient Goals at Discharge (Final Review):    ITP Comments:  ITP Comments    Row Name 09/02/20 1342           ITP Comments Dr Fransico Him MD, Medical Director              Comments: Cindy [name] attended orientation on 09/02/2020 to review rules and guidelines for program.  Completed 6 minute walk test, Intitial ITP, and exercise prescription.  VSS. Telemetry-Sinus Rhythm BBB. Cindy [name] reported feeling slightly short of breath and lightheaded post exercise walk test. Vital signs are as follows. Entry blood  pressure 104/70. Heart rate 92. Immediate post exercise walk test 131/83 heart rate 109. Blood pressure 2  Minutes was 94/62 heart rate 89.  Patient was given water post walk test . Repeat BP 108/73 sitting. Standing blood pressure 103/72. Will forward BP's to Dr Margaretann Loveless for review with today's BP's Safety measures and social distancing in place per CDC guidelines. Eugina had no symptoms upon exit from phase 2 cardiac rehab.Cindy Pall, RN,BSN 09/02/2020 4:03 PM

## 2020-09-03 ENCOUNTER — Ambulatory Visit (HOSPITAL_COMMUNITY): Payer: 59

## 2020-09-05 ENCOUNTER — Ambulatory Visit (HOSPITAL_COMMUNITY): Payer: 59

## 2020-09-08 ENCOUNTER — Ambulatory Visit (HOSPITAL_COMMUNITY): Payer: 59

## 2020-09-08 ENCOUNTER — Encounter (HOSPITAL_COMMUNITY)
Admission: RE | Admit: 2020-09-08 | Discharge: 2020-09-08 | Disposition: A | Discharge status: Home / Self Care | Payer: 59 | Source: Ambulatory Visit | Attending: Internal Medicine | Admitting: Internal Medicine

## 2020-09-08 ENCOUNTER — Other Ambulatory Visit: Payer: Self-pay

## 2020-09-08 DIAGNOSIS — Z955 Presence of coronary angioplasty implant and graft: Secondary | ICD-10-CM

## 2020-09-08 DIAGNOSIS — Z95820 Peripheral vascular angioplasty status with implants and grafts: Secondary | ICD-10-CM

## 2020-09-08 LAB — GLUCOSE, CAPILLARY: Glucose-Capillary: 326 mg/dL — ABNORMAL HIGH (ref 70–99)

## 2020-09-08 NOTE — Progress Notes (Signed)
Cindy [name] 54 y.o. female Nutrition Note  Visit Diagnosis: S/P DES LAD x 2  07/08/20  Past Medical History:  Diagnosis Date  . CAD in native artery 07/09/2020  . Coronary artery disease   . Diabetes mellitus   . GERD (gastroesophageal reflux disease)   . Hyperlipemia   . Hypertension   . S/P angioplasty with stent 07/09/2020  . Stroke [facility])      Medications reviewed.   Current Outpatient Medications:  .  acetaminophen (TYLENOL) 325 MG tablet, Take 2 tablets (650 mg total) by mouth every 4 (four) hours as needed for headache or mild pain., Disp: , Rfl:  .  aspirin 81 MG chewable tablet, Chew 1 tablet (81 mg total) by mouth daily., Disp: , Rfl:  .  clopidogrel (PLAVIX) 75 MG tablet, Take 1 tablet (75 mg total) by mouth daily with breakfast., Disp: 30 tablet, Rfl: 6 .  Continuous Blood Gluc Sensor (FREESTYLE LIBRE 14 DAY SENSOR) MISC, Apply 1 each topically every 14 (fourteen) days. , Disp: , Rfl:  .  ezetimibe (ZETIA) 10 MG tablet, Take 1 tablet (10 mg total) by mouth daily., Disp: 30 tablet, Rfl: 3 .  FARXIGA 10 MG TABS tablet, Take 10 mg by mouth daily., Disp: , Rfl:  .  glucose blood (FREESTYLE LITE) test strip, 1 each by Other route as needed (check blood glucose). , Disp: , Rfl:  .  hydrochlorothiazide (HYDRODIURIL) 25 MG tablet, Take 0.5 tablets (12.5 mg total) by mouth daily., Disp: 15 tablet, Rfl: 6 .  Insulin Syringes, Disposable, U-100 0.5 ML MISC, 30 Units by Does not apply route 2 (two) times daily with a meal., Disp: 100 each, Rfl: 3 .  LANTUS SOLOSTAR 100 UNIT/ML Solostar Pen, Inject 30 Units into the skin at bedtime., Disp: , Rfl:  .  lisinopril (ZESTRIL) 40 MG tablet, Take 1 tablet (40 mg total) by mouth daily., Disp: 30 tablet, Rfl: 3 .  metoprolol succinate (TOPROL-XL) 25 MG 24 hr tablet, Take 1 tablet (25 mg total) by mouth daily., Disp: 30 tablet, Rfl: 6 .  nitroGLYCERIN (NITROSTAT) 0.4 MG SL tablet, Place 1 tablet (0.4 mg total) under the tongue every 5 (five)  minutes as needed for chest pain., Disp: 25 tablet, Rfl: 4 .  NOVOLOG FLEXPEN RELION 100 UNIT/ML FlexPen, Inject 30 Units into the skin daily before supper., Disp: , Rfl:  .  pantoprazole (PROTONIX) 20 MG tablet, Take 1 tablet (20 mg total) by mouth daily., Disp: 30 tablet, Rfl: 6 .  rosuvastatin (CRESTOR) 40 MG tablet, Take 1 tablet (40 mg total) by mouth daily., Disp: 90 tablet, Rfl: 0 .  sertraline (ZOLOFT) 50 MG tablet, Take 1 tablet (50 mg total) by mouth at bedtime., Disp: 90 tablet, Rfl: 0   Ht Readings from Last 1 Encounters:  09/02/20 5' 3.75" (1.619 m)     Wt Readings from Last 3 Encounters:  09/02/20 245 lb 13 oz (111.5 kg)  07/24/20 249 lb 3.2 oz (113 kg)  07/08/20 253 lb 8.5 oz (115 kg)     There is no height or weight on file to calculate BMI.   Social History   Tobacco Use  Smoking Status Never Smoker  Smokeless Tobacco Never Used     Lab Results  Component Value Date   CHOL 219 (H) 07/07/2020   Lab Results  Component Value Date   HDL 36 (L) 07/07/2020   Lab Results  Component Value Date   LDLCALC 153 (H) 07/07/2020   Lab  Results  Component Value Date   TRIG 150 (H) 07/07/2020     Lab Results  Component Value Date   HGBA1C 12.3 (H) 07/06/2020     CBG (last 3)  Recent Labs    09/08/20 1031  GLUCAP 326*     Nutrition Note  Spoke with pt. Nutrition Plan and Nutrition Survey goals reviewed with pt. Pt is following a Heart Healthy diet. Pt wants to lose wt.  Reviewed weight history. She lost weight about 5 years ago. She was 170 lbs and has gained weight since. She was walking daily and paying more attention to her diet in effort to lose weight and to control dm.  Currently stress levels are high. She is sleeping 6-7 hours per night.   Pt has Type 2 Diabetes. Pt checks CBG's 4 times a day. Fasting CBG's reportedly 180-200 mg/dL. She says her CBGs increase in the morning at breakfast and stay in the high 200's/low 300's all day.  She takes  novolog 30 units at lunch (her biggest meal, 3:30 pm) and Lantus 30 units at bedtime.  She thinks she attended diabetes education when she was diagnosed 10 years ago. She still has some materials. She tries to guesstimate carb intake at meals sometimes. She does not read labels.   Pt expressed understanding of the information reviewed.    Nutrition Diagnosis  ? Excessive carbohydrate intake related to consumption of convenience foods and sugary beverages as evidenced by A1C 12.3   Nutrition Intervention ? Pt's individual nutrition plan reviewed with pt. ? Benefits of adopting Heart Healthy diet discussed when Medficts reviewed.   ? Continue client-centered nutrition education by RD, as part of interdisciplinary care.  Goal(s) ? CBG concentrations in the normal range or as close to normal as is safely possible. ? Improved blood glucose control as evidenced by pt's A1c trending from 12.3 toward less than 7.0. ? Pt able to name foods that affect blood glucose  Plan:   Will provide client-centered nutrition education as part of interdisciplinary care  Monitor and evaluate progress toward nutrition goal with team.   Michaele Offer, MS, RDN, LDN

## 2020-09-08 NOTE — Progress Notes (Signed)
Incomplete Session Note  Patient Details  Name: Cindy [name] MRN: [id number] Date of Birth: [birth year] Referring Provider:     CARDIAC REHAB PHASE II ORIENTATION from 09/02/2020 in [place]  Referring Provider Elouise Munroe, MD      [name] did not complete her rehab session.  CBG 326. No exercise per protocol. Patient's primary care provider Dr Ayesha Rumpf called and notified. Appointment made for [name] to see Dr Ayesha Rumpf tomorrow at 10:30. Patient will be counseled by our registered dietitian, Michaele Offer..Will hold off on having the patient begin exercise as her CBG's are running in the upper 200's and low 300's at home. Patient says that she is taking her medications and insulin as proscribed.Barnet Pall, RN,BSN 09/08/2020 10:56 AM

## 2020-09-10 ENCOUNTER — Ambulatory Visit (HOSPITAL_COMMUNITY): Payer: 59

## 2020-09-10 ENCOUNTER — Encounter (HOSPITAL_COMMUNITY): Payer: 59

## 2020-09-11 ENCOUNTER — Encounter: Payer: Self-pay | Admitting: Internal Medicine

## 2020-09-11 ENCOUNTER — Ambulatory Visit (INDEPENDENT_AMBULATORY_CARE_PROVIDER_SITE_OTHER): Payer: 59 | Admitting: Internal Medicine

## 2020-09-11 ENCOUNTER — Other Ambulatory Visit: Payer: Self-pay

## 2020-09-11 VITALS — BP 135/80 | HR 79 | Ht 63.0 in | Wt 242.6 lb

## 2020-09-11 DIAGNOSIS — E7800 Pure hypercholesterolemia, unspecified: Secondary | ICD-10-CM

## 2020-09-11 DIAGNOSIS — I10 Essential (primary) hypertension: Secondary | ICD-10-CM | POA: Diagnosis not present

## 2020-09-11 DIAGNOSIS — Z79899 Other long term (current) drug therapy: Secondary | ICD-10-CM

## 2020-09-11 DIAGNOSIS — I2510 Atherosclerotic heart disease of native coronary artery without angina pectoris: Secondary | ICD-10-CM | POA: Diagnosis not present

## 2020-09-11 DIAGNOSIS — E0822 Diabetes mellitus due to underlying condition with diabetic chronic kidney disease: Secondary | ICD-10-CM

## 2020-09-11 DIAGNOSIS — K219 Gastro-esophageal reflux disease without esophagitis: Secondary | ICD-10-CM

## 2020-09-11 MED ORDER — ROSUVASTATIN CALCIUM 40 MG PO TABS: 40.0000 mg | ORAL_TABLET | Freq: Every day | ORAL | 0 refills | Status: DC

## 2020-09-11 MED ORDER — EZETIMIBE 10 MG PO TABS: 10.0000 mg | ORAL_TABLET | Freq: Every day | ORAL | 3 refills | Status: DC

## 2020-09-11 NOTE — Patient Instructions (Signed)
Medication Instructions:  None Ordered At This Time.  *If you need a refill on your cardiac medications before your next appointment, please call your pharmacy*  Lab Work: None Ordered At This Time.  If you have labs (blood work) drawn today and your tests are completely normal, you will receive your results only by: Marland Kitchen MyChart Message (if you have MyChart) OR . A paper copy in the mail If you have any lab test that is abnormal or we need to change your treatment, we will call you to review the results.  Testing/Procedures: None Ordered At This Time.   Follow-Up: At [facility], you and your health needs are our priority.  As part of our continuing mission to provide you with exceptional heart care, we have created designated Provider Care Teams.  These Care Teams include your primary Cardiologist (physician) and Advanced Practice Providers (APPs -  Physician Assistants and Nurse Practitioners) who all work together to provide you with the care you need, when you need it.  Your next appointment:   3 month(s)  The format for your next appointment:   In Person  Provider:   Cherlynn Kaiser, MD

## 2020-09-11 NOTE — Progress Notes (Signed)
Cardiology Office Note:    Date:  09/11/2020   ID:  [name], DOB Nov 08, 1966, MRN [id number]  PCP:  Lin Landsman, MD  Cardiologist:  Elouise Munroe, MD  Electrophysiologist:  None   Referring MD: Lin Landsman, MD   Chief Complaint/Reason for Referral: CAD  History of Present Illness:    Cindy [name] is a 54 y.o. female with a history of CAD s/p PCI for UA, type 2 diabetes, HLD, OSA on CPAP, GERD, morbid obesity and essential hypertension.   Presents for follow up. Has started cardiac rehab but difficulty due to uncontrolled diabetes. Unfortunately she tells me her insurance does not cover her current insulin prescription, so she is not taking her insulin yet. BG has been quite high.   Started on protonix for chest pain and anti HTN therapy adjusted to lisinopril 40 mg daily, hctz 12.5 mg daily and metoprolol succinate 25 mg daily.   Past Medical History:  Diagnosis Date  . CAD in native artery 07/09/2020  . Coronary artery disease   . Diabetes mellitus   . GERD (gastroesophageal reflux disease)   . Hyperlipemia   . Hypertension   . S/P angioplasty with stent 07/09/2020  . Stroke [facility])     Past Surgical History:  Procedure Laterality Date  . CARDIAC CATHETERIZATION    . CORONARY STENT INTERVENTION N/A 07/08/2020   Procedure: CORONARY STENT INTERVENTION;  Surgeon: Jettie Booze, MD;  Location: [place] [facility];  Service: Cardiovascular;  Laterality: N/A;  . INTRAVASCULAR ULTRASOUND/IVUS N/A 07/08/2020   Procedure: Intravascular Ultrasound/IVUS;  Surgeon: Jettie Booze, MD;  Location: [place] [facility];  Service: Cardiovascular;  Laterality: N/A;  . LEFT HEART CATH AND CORONARY ANGIOGRAPHY N/A 07/07/2020   Procedure: LEFT HEART CATH AND CORONARY ANGIOGRAPHY;  Surgeon: Lorretta Harp, MD;  Location: [place] [facility];  Service: Cardiovascular;  Laterality: N/A;  . None      Current Medications: Current Meds  Medication Sig  .  acetaminophen (TYLENOL) 325 MG tablet Take 2 tablets (650 mg total) by mouth every 4 (four) hours as needed for headache or mild pain.  Marland Kitchen aspirin 81 MG chewable tablet Chew 1 tablet (81 mg total) by mouth daily.  . clopidogrel (PLAVIX) 75 MG tablet Take 1 tablet (75 mg total) by mouth daily with breakfast.  . Continuous Blood Gluc Sensor (FREESTYLE LIBRE 14 DAY SENSOR) MISC Apply 1 each topically every 14 (fourteen) days.   Marland Kitchen ezetimibe (ZETIA) 10 MG tablet Take 1 tablet (10 mg total) by mouth daily.  Marland Kitchen FARXIGA 10 MG TABS tablet Take 10 mg by mouth daily.  Marland Kitchen glucose blood (FREESTYLE LITE) test strip 1 each by Other route as needed (check blood glucose).   . hydrochlorothiazide (HYDRODIURIL) 25 MG tablet Take 0.5 tablets (12.5 mg total) by mouth daily.  . Insulin Syringes, Disposable, U-100 0.5 ML MISC 30 Units by Does not apply route 2 (two) times daily with a meal.  . LANTUS SOLOSTAR 100 UNIT/ML Solostar Pen Inject 30 Units into the skin at bedtime.  Marland Kitchen lisinopril (ZESTRIL) 40 MG tablet Take 1 tablet (40 mg total) by mouth daily.  . metoprolol succinate (TOPROL-XL) 25 MG 24 hr tablet Take 1 tablet (25 mg total) by mouth daily.  . nitroGLYCERIN (NITROSTAT) 0.4 MG SL tablet Place 1 tablet (0.4 mg total) under the tongue every 5 (five) minutes as needed for chest pain.  Marland Kitchen NOVOLOG FLEXPEN RELION 100 UNIT/ML FlexPen Inject 30 Units into the skin  daily before supper.  . pantoprazole (PROTONIX) 20 MG tablet Take 1 tablet (20 mg total) by mouth daily.  . rosuvastatin (CRESTOR) 40 MG tablet Take 1 tablet (40 mg total) by mouth daily.  . sertraline (ZOLOFT) 50 MG tablet Take 1 tablet (50 mg total) by mouth at bedtime.     Allergies:   Other   Social History   Tobacco Use  . Smoking status: Never Smoker  . Smokeless tobacco: Never Used  Vaping Use  . Vaping Use: Never used  Substance Use Topics  . Alcohol use: No  . Drug use: No     Family History: The patient's family history includes Breast  cancer in her maternal grandmother and mother; Diabetes in her mother; Heart failure in her father; Hypertension in her father and mother; Stroke in her brother and mother. There is no history of Colon cancer, Stomach cancer, Rectal cancer, Esophageal cancer, or Liver cancer.  ROS:   Please see the history of present illness.    All other systems reviewed and are negative.  EKGs/Labs/Other Studies Reviewed:    The following studies were reviewed today:  EKG:  NSR, RBBB, QRS 136 ms   Recent Labs: 07/09/2020: BUN 11; Creatinine, Ser 0.70; Hemoglobin 10.8; Platelets 339; Potassium 3.6; Sodium 136  Recent Lipid Panel    Component Value Date/Time   CHOL 219 (H) 07/07/2020 0118   CHOL 158 12/27/2018 1044   TRIG 150 (H) 07/07/2020 0118   HDL 36 (L) 07/07/2020 0118   HDL 40 12/27/2018 1044   CHOLHDL 6.1 07/07/2020 0118   VLDL 30 07/07/2020 0118   LDLCALC 153 (H) 07/07/2020 0118   LDLCALC 100 (H) 12/27/2018 1044    Physical Exam:    VS:  BP 135/80   Pulse 79   Ht 5\' 3"  (1.6 m)   Wt 242 lb 9.6 oz (110 kg)   SpO2 100%   BMI 42.97 kg/m     Wt Readings from Last 5 Encounters:  09/11/20 242 lb 9.6 oz (110 kg)  09/02/20 245 lb 13 oz (111.5 kg)  07/24/20 249 lb 3.2 oz (113 kg)  07/08/20 253 lb 8.5 oz (115 kg)  10/30/19 245 lb (111.1 kg)    Constitutional: No acute distress Cardiovascular: regular rhythm, normal rate, no murmurs. S1 and S2 normal. Radial pulses normal bilaterally. No jugular venous distention.  Respiratory: clear to auscultation bilaterally GI : normal bowel sounds, soft and nontender. No distention.   MSK: extremities warm, well perfused. No edema.  NEURO: grossly nonfocal exam, moves all extremities. PSYCH: alert and oriented x 3, normal mood and affect.   ASSESSMENT:    1. Essential hypertension   2. Coronary artery disease involving native coronary artery of native heart without angina pectoris   3. Pure hypercholesterolemia   4. Diabetes mellitus due to  underlying condition with chronic kidney disease, without long-term current use of insulin, unspecified CKD stage ([place])   5. Gastroesophageal reflux disease without esophagitis   6. Medication management    PLAN:    Essential hypertension - Plan: EKG 12-Lead - continue HCTZ, lisinopril, metoprolol succinate. BP can be slightly better but reasonable control at today's visit.   Coronary artery disease involving native coronary artery of native heart without angina pectoris - Plan: rosuvastatin (CRESTOR) 40 MG tablet, ezetimibe (ZETIA) 10 MG tablet - continue ASA and plavix for at least 12 months (until 07/08/21). I have instructed the patient that dual antiplatelet therapy should be taken for 1 year without interruption.  We have discussed the consequences of interrupted dual antiplatelet therapy and the risk for in-stent thrombosis.  - ASA is indefinite therapy.  - continue BB, statin.   Pure hypercholesterolemia -continue statin and ezetemibe, recheck lipids at next visit.  Gastroesophageal reflux disease without esophagitis - on protonix, helped with chest symptoms.  Total time of encounter: 30 minutes total time of encounter, including 25 minutes spent in face-to-face patient care on the date of this encounter. This time includes coordination of care and counseling regarding above mentioned problem list. Remainder of non-face-to-face time involved reviewing chart documents/testing relevant to the patient encounter and documentation in the medical record. I have independently reviewed documentation from referring provider.   Cherlynn Kaiser, MD [place]  [facility]    Medication Adjustments/Labs and Tests Ordered: Current medicines are reviewed at length with the patient today.  Concerns regarding medicines are outlined above.   Orders Placed This Encounter  Procedures  . EKG 12-Lead    Meds ordered this encounter  Medications  . rosuvastatin (CRESTOR) 40 MG tablet     Sig: Take 1 tablet (40 mg total) by mouth daily.    Dispense:  90 tablet    Refill:  0    Future refills by PCP  . ezetimibe (ZETIA) 10 MG tablet    Sig: Take 1 tablet (10 mg total) by mouth daily.    Dispense:  30 tablet    Refill:  3    Patient Instructions  Medication Instructions:  None Ordered At This Time.  *If you need a refill on your cardiac medications before your next appointment, please call your pharmacy*  Lab Work: None Ordered At This Time.  If you have labs (blood work) drawn today and your tests are completely normal, you will receive your results only by: Marland Kitchen MyChart Message (if you have MyChart) OR . A paper copy in the mail If you have any lab test that is abnormal or we need to change your treatment, we will call you to review the results.  Testing/Procedures: None Ordered At This Time.   Follow-Up: At [facility], you and your health needs are our priority.  As part of our continuing mission to provide you with exceptional heart care, we have created designated Provider Care Teams.  These Care Teams include your primary Cardiologist (physician) and Advanced Practice Providers (APPs -  Physician Assistants and Nurse Practitioners) who all work together to provide you with the care you need, when you need it.  Your next appointment:   3 month(s)  The format for your next appointment:   In Person  Provider:   Cherlynn Kaiser, MD

## 2020-09-12 ENCOUNTER — Encounter (HOSPITAL_COMMUNITY): Payer: 59

## 2020-09-12 ENCOUNTER — Ambulatory Visit (HOSPITAL_COMMUNITY): Payer: 59

## 2020-09-15 ENCOUNTER — Ambulatory Visit (HOSPITAL_COMMUNITY): Payer: 59

## 2020-09-15 ENCOUNTER — Telehealth (HOSPITAL_COMMUNITY): Payer: Self-pay | Admitting: *Deleted

## 2020-09-15 ENCOUNTER — Encounter (HOSPITAL_COMMUNITY): Payer: 59

## 2020-09-15 NOTE — Telephone Encounter (Signed)
Spoke with Ms Welge. Patient reports that her blood sugar was greater than 300 this morning therefore she did not come to exercise at cardiac rehab per protocol. Ms Killilea says that her insulin has been changed to 70/30. Ms Lucado hopes to exercise on Wednesday.Barnet Pall, RN,BSN 09/15/2020 1:59 PM

## 2020-09-17 ENCOUNTER — Ambulatory Visit (HOSPITAL_COMMUNITY): Payer: 59

## 2020-09-17 ENCOUNTER — Encounter (HOSPITAL_COMMUNITY)
Admission: RE | Admit: 2020-09-17 | Discharge: 2020-09-17 | Disposition: A | Discharge status: Home / Self Care | Payer: 59 | Source: Ambulatory Visit | Attending: Internal Medicine | Admitting: Internal Medicine

## 2020-09-17 ENCOUNTER — Other Ambulatory Visit: Payer: Self-pay

## 2020-09-17 DIAGNOSIS — Z955 Presence of coronary angioplasty implant and graft: Secondary | ICD-10-CM | POA: Diagnosis not present

## 2020-09-17 DIAGNOSIS — Z95820 Peripheral vascular angioplasty status with implants and grafts: Secondary | ICD-10-CM

## 2020-09-17 LAB — GLUCOSE, CAPILLARY
Glucose-Capillary: 233 mg/dL — ABNORMAL HIGH (ref 70–99)
Glucose-Capillary: 221 mg/dL — ABNORMAL HIGH (ref 70–99)

## 2020-09-17 NOTE — Progress Notes (Signed)
Daily Session Note  Patient Details  Name: Cindy [name] MRN: [id number] Date of Birth: 1966/05/29 Referring Provider:     CARDIAC REHAB PHASE II ORIENTATION from 09/02/2020 in [place]  Referring Provider Elouise Munroe, MD      Encounter Date: 09/17/2020  Check In:  Session Check In - 09/17/20 1051      Check-In   Supervising physician immediately available to respond to emergencies [facility] immediately available    Physician(s) Dr. Maylene Roes    Location [facility]    Staff Present Barnet Pall, RN, Milus Glazier, MS, EP-C, CCRP;Tyara Nevels, MS,ACSM CEP, Exercise Physiologist;Olinty Celesta Aver, MS, ACSM CEP, Exercise Physiologist;Lisa Ysidro Evert, RN    Virtual Visit No    Medication changes reported     No    Fall or balance concerns reported    No    Tobacco Cessation No Change    Warm-up and Cool-down Performed on first and last piece of equipment    Resistance Training Performed No    VAD Patient? No    PAD/SET Patient? No      Pain Assessment   Currently in Pain? No/denies    Multiple Pain Sites No           Capillary Blood Glucose: Results for orders placed or performed during the hospital encounter of 09/17/20 (from the past 24 hour(s))  Glucose, capillary     Status: Abnormal   Collection Time: 09/17/20 10:38 AM  Result Value Ref Range   Glucose-Capillary 233 (H) 70 - 99 mg/dL  Glucose, capillary     Status: Abnormal   Collection Time: 09/17/20 11:44 AM  Result Value Ref Range   Glucose-Capillary 221 (H) 70 - 99 mg/dL     Exercise Prescription Changes - 09/17/20 1200      Response to Exercise   Blood Pressure (Admit) 126/70    Blood Pressure (Exercise) 142/78    Blood Pressure (Exit) 106/62    Heart Rate (Admit) 75 bpm    Heart Rate (Exercise) 100 bpm    Heart Rate (Exit) 78 bpm    Rating of Perceived Exertion (Exercise) 13    Perceived Dyspnea (Exercise) 0    Symptoms None     Comments Pt's first day of exercise    Duration Progress to 10 minutes continuous walking  at current work load and total walking time to 30-45 min    Intensity THRR unchanged      Progression   Progression Continue to progress workloads to maintain intensity without signs/symptoms of physical distress.    Average METs 1.5      Resistance Training   Training Prescription No      Recumbant Bike   Level 1.5    Watts 5    Minutes 15    METs 1.3      NuStep   Level 2    SPM 75    Minutes 15    METs 1.6           Social History   Tobacco Use  Smoking Status Never Smoker  Smokeless Tobacco Never Used    Goals Met:  Exercise tolerated well No report of cardiac concerns or symptoms  Goals Unmet:  Not Applicable  Comments: Cindy [name] started cardiac rehab today.  Pt tolerated light exercise without difficulty. VSS, telemetry-Sinus Rhythm, asymptomatic.  Medication list reconciled.Cindy [name] is now taking 70/30 Novo log insulin 20 units SQ every morning. Pt denies barriers to medicaiton compliance.  PSYCHOSOCIAL ASSESSMENT:  PHQ-0. Pt exhibits positive coping skills, hopeful outlook with supportive family. No psychosocial needs identified at this time, no psychosocial interventions necessary.  Pt oriented to exercise equipment and routine.    Understanding verbalized.Barnet Pall, RN,BSN 09/17/2020 1:12 PM   Dr. Fransico Him is Medical Director for Cardiac Rehab at [facility].

## 2020-09-19 ENCOUNTER — Other Ambulatory Visit: Payer: Self-pay

## 2020-09-19 ENCOUNTER — Ambulatory Visit (HOSPITAL_COMMUNITY): Payer: 59

## 2020-09-19 ENCOUNTER — Encounter (HOSPITAL_COMMUNITY)
Admission: RE | Admit: 2020-09-19 | Discharge: 2020-09-19 | Disposition: A | Discharge status: Home / Self Care | Payer: 59 | Source: Ambulatory Visit | Attending: Internal Medicine | Admitting: Internal Medicine

## 2020-09-19 DIAGNOSIS — Z955 Presence of coronary angioplasty implant and graft: Secondary | ICD-10-CM

## 2020-09-22 ENCOUNTER — Encounter (HOSPITAL_COMMUNITY)
Admission: RE | Admit: 2020-09-22 | Discharge: 2020-09-22 | Disposition: A | Discharge status: Home / Self Care | Payer: 59 | Source: Ambulatory Visit | Attending: Internal Medicine | Admitting: Internal Medicine

## 2020-09-22 ENCOUNTER — Ambulatory Visit (HOSPITAL_COMMUNITY): Payer: 59

## 2020-09-22 ENCOUNTER — Other Ambulatory Visit: Payer: Self-pay

## 2020-09-22 DIAGNOSIS — Z955 Presence of coronary angioplasty implant and graft: Secondary | ICD-10-CM | POA: Diagnosis not present

## 2020-09-22 DIAGNOSIS — Z95820 Peripheral vascular angioplasty status with implants and grafts: Secondary | ICD-10-CM

## 2020-09-22 LAB — GLUCOSE, CAPILLARY: Glucose-Capillary: 180 mg/dL — ABNORMAL HIGH (ref 70–99)

## 2020-09-22 NOTE — Progress Notes (Signed)
Nutrition Note   Continuous glucose monitoring download: Freestyle libre 14 day   Pt ran out of sensors so data is limited. Will print another report next week with more data. She has 3 month supply of sensors from pharmacy now.      Average is    260 mg/dl  for 14 days   Time sensor is active   42 %   Time in range (70-180 mg/dL):  17 % (Goal >70%)   Time High (181-250 mg/dL)  39 % (Goal < 25%)   Time Very High (>250 mg/dl)  44 % (Goal < 5%)   Time Low (54-69 mg/dL)  0 % (Goal is <4%)   Time Very Low (<54)  0%  (Goal <1%)   Coefficient of variation  32.4% (Goal is <36%)      Pt verbalizes insulin regimen:  AM: 37 units 70/30  PM: 50 units lantus Prandial: 20 units at each meal (3 daily)  Reviewed trends from daily log report. She notices that she forgets to take prandial insulin when she goes out to eat/eats out of the home.  We reviewed arrows on reader and what they mean. She was able to make treatment decisions based on these arrows.   Goals: take insulin pen when eating outside the home  Will continue to monitor pt during time in cardiac rehab.

## 2020-09-24 ENCOUNTER — Ambulatory Visit (HOSPITAL_COMMUNITY): Payer: 59

## 2020-09-24 ENCOUNTER — Other Ambulatory Visit: Payer: Self-pay

## 2020-09-24 ENCOUNTER — Encounter (HOSPITAL_COMMUNITY)
Admission: RE | Admit: 2020-09-24 | Discharge: 2020-09-24 | Disposition: A | Discharge status: Home / Self Care | Payer: 59 | Source: Ambulatory Visit | Attending: Internal Medicine | Admitting: Internal Medicine

## 2020-09-24 DIAGNOSIS — Z955 Presence of coronary angioplasty implant and graft: Secondary | ICD-10-CM

## 2020-09-25 NOTE — Progress Notes (Signed)
Cardiac Individual Treatment Plan  Patient Details  Name: Cindy [name] MRN: [id number] Date of Birth: 30-Aug-1966 Referring Provider:     CARDIAC REHAB PHASE II ORIENTATION from 09/02/2020 in [facility]  Referring Provider Elouise Munroe, MD      Initial Encounter Date:    CARDIAC REHAB PHASE II ORIENTATION from 09/02/2020 in [facility]  Date 09/02/20      Visit Diagnosis: S/P DES LAD x 2  07/08/20  Patient's Home Medications on Admission:  Current Outpatient Medications:  .  acetaminophen (TYLENOL) 325 MG tablet, Take 2 tablets (650 mg total) by mouth every 4 (four) hours as needed for headache or mild pain., Disp: , Rfl:  .  aspirin 81 MG chewable tablet, Chew 1 tablet (81 mg total) by mouth daily., Disp: , Rfl:  .  clopidogrel (PLAVIX) 75 MG tablet, Take 1 tablet (75 mg total) by mouth daily with breakfast., Disp: 30 tablet, Rfl: 6 .  Continuous Blood Gluc Sensor (FREESTYLE LIBRE 14 DAY SENSOR) MISC, Apply 1 each topically every 14 (fourteen) days. , Disp: , Rfl:  .  ezetimibe (ZETIA) 10 MG tablet, Take 1 tablet (10 mg total) by mouth daily., Disp: 30 tablet, Rfl: 3 .  FARXIGA 10 MG TABS tablet, Take 10 mg by mouth daily., Disp: , Rfl:  .  glucose blood (FREESTYLE LITE) test strip, 1 each by Other route as needed (check blood glucose). , Disp: , Rfl:  .  hydrochlorothiazide (HYDRODIURIL) 25 MG tablet, Take 0.5 tablets (12.5 mg total) by mouth daily., Disp: 15 tablet, Rfl: 6 .  insulin aspart protamine- aspart (NOVOLOG MIX 70/30) (70-30) 100 UNIT/ML injection, Inject 20 Units into the skin daily with breakfast., Disp: , Rfl:  .  Insulin Syringes, Disposable, U-100 0.5 ML MISC, 30 Units by Does not apply route 2 (two) times daily with a meal., Disp: 100 each, Rfl: 3 .  LANTUS SOLOSTAR 100 UNIT/ML Solostar Pen, Inject 30 Units into the skin at bedtime., Disp: , Rfl:  .  lisinopril (ZESTRIL) 40 MG tablet, Take 1  tablet (40 mg total) by mouth daily., Disp: 30 tablet, Rfl: 3 .  metoprolol succinate (TOPROL-XL) 25 MG 24 hr tablet, Take 1 tablet (25 mg total) by mouth daily., Disp: 30 tablet, Rfl: 6 .  nitroGLYCERIN (NITROSTAT) 0.4 MG SL tablet, Place 1 tablet (0.4 mg total) under the tongue every 5 (five) minutes as needed for chest pain., Disp: 25 tablet, Rfl: 4 .  NOVOLOG FLEXPEN RELION 100 UNIT/ML FlexPen, Inject 30 Units into the skin daily before supper., Disp: , Rfl:  .  pantoprazole (PROTONIX) 20 MG tablet, Take 1 tablet (20 mg total) by mouth daily., Disp: 30 tablet, Rfl: 6 .  rosuvastatin (CRESTOR) 40 MG tablet, Take 1 tablet (40 mg total) by mouth daily., Disp: 90 tablet, Rfl: 0 .  sertraline (ZOLOFT) 50 MG tablet, Take 1 tablet (50 mg total) by mouth at bedtime., Disp: 90 tablet, Rfl: 0  Past Medical History: Past Medical History:  Diagnosis Date  . CAD in native artery 07/09/2020  . Coronary artery disease   . Diabetes mellitus   . GERD (gastroesophageal reflux disease)   . Hyperlipemia   . Hypertension   . S/P angioplasty with stent 07/09/2020  . Stroke [facility])     Tobacco Use: Social History   Tobacco Use  Smoking Status Never Smoker  Smokeless Tobacco Never Used    Labs: Recent Review Scientist, physiological  Labs for ITP Cardiac and Pulmonary Rehab Latest Ref Rng & Units 08/18/2018 08/19/2018 12/27/2018 07/06/2020 07/07/2020   Cholestrol 0 - 200 mg/dL - 186 158 - 219(H)   LDLCALC 0 - 99 mg/dL - 126(H) 100(H) - 153(H)   HDL >40 mg/dL - 32(L) 40 - 36(L)   Trlycerides <150 mg/dL - 140 91 - 150(H)   Hemoglobin A1c 4.8 - 5.6 % - 11.7(H) 11.7(H) 12.3(H) -   TCO2 22 - 32 mmol/L 28 - - - -      Capillary Blood Glucose: Lab Results  Component Value Date   GLUCAP 180 (H) 09/19/2020   GLUCAP 221 (H) 09/17/2020   GLUCAP 233 (H) 09/17/2020   GLUCAP 326 (H) 09/08/2020   GLUCAP 258 (H) 07/09/2020     Exercise Target Goals: Exercise Program Goal: Individual exercise prescription set using  results from initial 6 min walk test and THRR while considering  patient's activity barriers and safety.   Exercise Prescription Goal: Starting with aerobic activity 30 plus minutes a day, 3 days per week for initial exercise prescription. Provide home exercise prescription and guidelines that participant acknowledges understanding prior to discharge.  Activity Barriers & Risk Stratification:  Activity Barriers & Cardiac Risk Stratification - 09/02/20 1330      Activity Barriers & Cardiac Risk Stratification   Activity Barriers Arthritis;Back Problems;Assistive Device;Other (comment)    Comments Chronic low back pain, neuropathy in both feet.    Cardiac Risk Stratification Moderate           6 Minute Walk:  6 Minute Walk    Row Name 09/02/20 1341         6 Minute Walk   Phase Initial     Distance 1150 feet     Walk Time 6 minutes     # of Rest Breaks 0     MPH 2.18     METS 2.7     RPE 11     Perceived Dyspnea  1     VO2 Peak 9.44     Symptoms Yes (comment)     Comments Patient c/o mild SOB during walk, 1/4 on the dyspnea scale. During the recovery patient's BP was 94/62, and patient c/o feeling lightheaded. Water given. Recheck BP was 108/73 seated, 103/72 standing, and patient was asymptomatic.     Resting HR 92 bpm     Resting BP 104/70     Resting Oxygen Saturation  100 %     Exercise Oxygen Saturation  during 6 min walk 97 %     Max Ex. HR 109 bpm     Max Ex. BP 131/83     2 Minute Post BP 94/62            Oxygen Initial Assessment:   Oxygen Re-Evaluation:   Oxygen Discharge (Final Oxygen Re-Evaluation):   Initial Exercise Prescription:  Initial Exercise Prescription - 09/02/20 1400      Date of Initial Exercise RX and Referring Provider   Date 09/02/20    Referring Provider Elouise Munroe, MD    Expected Discharge Date 10/31/20      Recumbant Bike   Level 1.5    Watts 15    Minutes 15    METs 2.43      NuStep   Level 2    SPM 85     Minutes 15    METs 2      Prescription Details   Frequency (times per week) 3  Duration Progress to 30 minutes of continuous aerobic without signs/symptoms of physical distress      Intensity   THRR 40-80% of Max Heartrate 66-132    Ratings of Perceived Exertion 11-13    Perceived Dyspnea 0-4      Progression   Progression Continue to progress workloads to maintain intensity without signs/symptoms of physical distress.      Resistance Training   Training Prescription Yes    Weight 3 lbs    Reps 10-15           Perform Capillary Blood Glucose checks as needed.  Exercise Prescription Changes:   Exercise Prescription Changes    Row Name 09/17/20 1200 09/24/20 1023           Response to Exercise   Blood Pressure (Admit) 126/70 108/60      Blood Pressure (Exercise) 142/78 118/72      Blood Pressure (Exit) 106/62 102/70      Heart Rate (Admit) 75 bpm 68 bpm      Heart Rate (Exercise) 100 bpm 98 bpm      Heart Rate (Exit) 78 bpm 76 bpm      Rating of Perceived Exertion (Exercise) 13 12      Perceived Dyspnea (Exercise) 0 0      Symptoms None None      Comments Pt's first day of exercise None      Duration Progress to 10 minutes continuous walking  at current work load and total walking time to 30-45 min Progress to 10 minutes continuous walking  at current work load and total walking time to 30-45 min      Intensity THRR unchanged THRR unchanged        Progression   Progression Continue to progress workloads to maintain intensity without signs/symptoms of physical distress. Continue to progress workloads to maintain intensity without signs/symptoms of physical distress.      Average METs 1.5 1.6        Resistance Training   Training Prescription No No        Recumbant Bike   Level 1.5 1.5      Watts 5 5      Minutes 15 15      METs 1.3 1.5        NuStep   Level 2 2      SPM 75 75      Minutes 15 15      METs 1.6 1.7             Exercise Comments:    Exercise Comments    Row Name 09/17/20 1206 09/25/20 1024         Exercise Comments Pt's first day of exercise. Pt oriented to exercise equipment. Responded well with exercise prescription. Will continue to monitor. Pt is continuing to respond well to exercise prescription. Will follow up with pt regarding home exercise plan.             Exercise Goals and Review:   Exercise Goals    Row Name 09/02/20 1332             Exercise Goals   Increase Physical Activity Yes       Intervention Provide advice, education, support and counseling about physical activity/exercise needs.;Develop an individualized exercise prescription for aerobic and resistive training based on initial evaluation findings, risk stratification, comorbidities and participant's personal goals.       Expected Outcomes Short Term: Attend rehab on a regular basis  to increase amount of physical activity.;Long Term: Exercising regularly at least 3-5 days a week.;Long Term: Add in home exercise to make exercise part of routine and to increase amount of physical activity.       Increase Strength and Stamina Yes       Intervention Provide advice, education, support and counseling about physical activity/exercise needs.;Develop an individualized exercise prescription for aerobic and resistive training based on initial evaluation findings, risk stratification, comorbidities and participant's personal goals.       Expected Outcomes Short Term: Increase workloads from initial exercise prescription for resistance, speed, and METs.;Short Term: Perform resistance training exercises routinely during rehab and add in resistance training at home;Long Term: Improve cardiorespiratory fitness, muscular endurance and strength as measured by increased METs and functional capacity (6MWT)       Able to understand and use rate of perceived exertion (RPE) scale Yes       Intervention Provide education and explanation on how to use RPE scale        Expected Outcomes Short Term: Able to use RPE daily in rehab to express subjective intensity level;Long Term:  Able to use RPE to guide intensity level when exercising independently       Knowledge and understanding of Target Heart Rate Range (THRR) Yes       Intervention Provide education and explanation of THRR including how the numbers were predicted and where they are located for reference       Expected Outcomes Short Term: Able to state/look up THRR;Long Term: Able to use THRR to govern intensity when exercising independently;Short Term: Able to use daily as guideline for intensity in rehab       Able to check pulse independently Yes       Intervention Provide education and demonstration on how to check pulse in carotid and radial arteries.;Review the importance of being able to check your own pulse for safety during independent exercise       Expected Outcomes Short Term: Able to explain why pulse checking is important during independent exercise;Long Term: Able to check pulse independently and accurately       Understanding of Exercise Prescription Yes       Intervention Provide education, explanation, and written materials on patient's individual exercise prescription       Expected Outcomes Short Term: Able to explain program exercise prescription;Long Term: Able to explain home exercise prescription to exercise independently              Exercise Goals Re-Evaluation :  Exercise Goals Re-Evaluation    Row Name 09/17/20 1206             Exercise Goal Re-Evaluation   Exercise Goals Review Increase Physical Activity;Increase Strength and Stamina;Able to understand and use rate of perceived exertion (RPE) scale;Knowledge and understanding of Target Heart Rate Range (THRR);Able to check pulse independently;Understanding of Exercise Prescription       Comments Pt's first day of exercise. Pt was able to exercise for 30 minutes with minimal difficulty. Will continue to monitor and progress  workloads as tolerated.       Expected Outcomes Pt will continue to increase her strength and stamina.               Discharge Exercise Prescription (Final Exercise Prescription Changes):  Exercise Prescription Changes - 09/24/20 1023      Response to Exercise   Blood Pressure (Admit) 108/60    Blood Pressure (Exercise) 118/72    Blood Pressure (  Exit) 102/70    Heart Rate (Admit) 68 bpm    Heart Rate (Exercise) 98 bpm    Heart Rate (Exit) 76 bpm    Rating of Perceived Exertion (Exercise) 12    Perceived Dyspnea (Exercise) 0    Symptoms None    Comments None    Duration Progress to 10 minutes continuous walking  at current work load and total walking time to 30-45 min    Intensity THRR unchanged      Progression   Progression Continue to progress workloads to maintain intensity without signs/symptoms of physical distress.    Average METs 1.6      Resistance Training   Training Prescription No      Recumbant Bike   Level 1.5    Watts 5    Minutes 15    METs 1.5      NuStep   Level 2    SPM 75    Minutes 15    METs 1.7           Nutrition:  Target Goals: Understanding of nutrition guidelines, daily intake of sodium 1500mg , cholesterol 200mg , calories 30% from fat and 7% or less from saturated fats, daily to have 5 or more servings of fruits and vegetables.  Biometrics:  Pre Biometrics - 09/02/20 1330      Pre Biometrics   Height 5' 3.75" (1.619 m)    Waist Circumference 51.5 inches    Hip Circumference 52.5 inches    Waist to Hip Ratio 0.98 %    BMI (Calculated) 42.54    Triceps Skinfold 27 mm    % Body Fat 51.9 %    Grip Strength 23.5 kg    Flexibility 8 in    Single Leg Stand 4.56 seconds            Nutrition Therapy Plan and Nutrition Goals:  Nutrition Therapy & Goals - 09/08/20 1215      Nutrition Therapy   Diet Heart healthy/carb modified      Personal Nutrition Goals   Nutrition Goal Improved blood glucose control as evidenced by pt's  A1c trending from 12.3 toward less than 7.0.    Personal Goal #2 CBG concentrations in the normal range or as close to normal as is safely possible    Personal Goal #3 Pt able to name foods that affect blood glucose      Intervention Plan   Intervention Prescribe, educate and counsel regarding individualized specific dietary modifications aiming towards targeted core components such as weight, hypertension, lipid management, diabetes, heart failure and other comorbidities.;Nutrition handout(s) given to patient.    Expected Outcomes Short Term Goal: A plan has been developed with personal nutrition goals set during dietitian appointment.;Long Term Goal: Adherence to prescribed nutrition plan.           Nutrition Assessments:  Nutrition Assessments - 09/18/20 1453      MEDFICTS Scores   Pre Score 48           Nutrition Goals Re-Evaluation:  Nutrition Goals Re-Evaluation    [place] Name 09/08/20 1221             Goals   Current Weight 245 lb (111.1 kg)       Nutrition Goal Improved blood glucose control as evidenced by pt's A1c trending from 12.3 toward less than 7.0.         Personal Goal #2 Re-Evaluation   Personal Goal #2 CBG concentrations in the normal range or as close  to normal as is safely possible         Personal Goal #3 Re-Evaluation   Personal Goal #3 Pt able to name foods that affect blood glucose              Nutrition Goals Discharge (Final Nutrition Goals Re-Evaluation):  Nutrition Goals Re-Evaluation - 09/08/20 1221      Goals   Current Weight 245 lb (111.1 kg)    Nutrition Goal Improved blood glucose control as evidenced by pt's A1c trending from 12.3 toward less than 7.0.      Personal Goal #2 Re-Evaluation   Personal Goal #2 CBG concentrations in the normal range or as close to normal as is safely possible      Personal Goal #3 Re-Evaluation   Personal Goal #3 Pt able to name foods that affect blood glucose           Psychosocial: Target  Goals: Acknowledge presence or absence of significant depression and/or stress, maximize coping skills, provide positive support system. Participant is able to verbalize types and ability to use techniques and skills needed for reducing stress and depression.  Initial Review & Psychosocial Screening:  Initial Psych Review & Screening - 09/02/20 1546      Initial Review   Current issues with Current Stress Concerns    Source of Stress Concerns Chronic Illness      Family Dynamics   Good Support System? Yes   Nalleli has her daughter for support     Barriers   Psychosocial barriers to participate in program The patient should benefit from training in stress management and relaxation.      Screening Interventions   Interventions Encouraged to exercise;To provide support and resources with identified psychosocial needs;Provide feedback about the scores to participant    Expected Outcomes Long Term Goal: Stressors or current issues are controlled or eliminated.;Short Term goal: Identification and review with participant of any Quality of Life or Depression concerns found by scoring the questionnaire.;Long Term goal: The participant improves quality of Life and PHQ9 Scores as seen by post scores and/or verbalization of changes           Quality of Life Scores:  Quality of Life - 09/02/20 1503      Quality of Life   Select Quality of Life      Quality of Life Scores   Health/Function Pre 25.17 %    Socioeconomic Pre 30 %    Psych/Spiritual Pre 29.14 %    Family Pre 30 %    GLOBAL Pre 27.69 %          Scores of 19 and below usually indicate a poorer quality of life in these areas.  A difference of  2-3 points is a clinically meaningful difference.  A difference of 2-3 points in the total score of the Quality of Life Index has been associated with significant improvement in overall quality of life, self-image, physical symptoms, and general health in studies assessing change in quality  of life.  PHQ-9: Recent Review Flowsheet Data    Depression screen [facility] 2/9 09/02/2020   Decreased Interest 0   Down, Depressed, Hopeless 0   PHQ - 2 Score 0     Interpretation of Total Score  Total Score Depression Severity:  1-4 = Minimal depression, 5-9 = Mild depression, 10-14 = Moderate depression, 15-19 = Moderately severe depression, 20-27 = Severe depression   Psychosocial Evaluation and Intervention:   Psychosocial Re-Evaluation:  Psychosocial Re-Evaluation  [place] Name 09/18/20 1325 09/25/20 0749           Psychosocial Re-Evaluation   Current issues with Current Stress Concerns Current Stress Concerns      Comments Letta Median started exercise on 09/18/20. Will continue to  monitor and offer support as needed Letta Median has not voiced any increased concerns or stressors in phase 2 cardiac rehab      Expected Outcomes -- Letta Median will have decreased stressors upon completion of phase 2 cardiac rehab      Interventions Encouraged to attend Cardiac Rehabilitation for the exercise Encouraged to attend Cardiac Rehabilitation for the exercise      Continue Psychosocial Services  No Follow up required No Follow up required        Initial Review   Source of Stress Concerns Chronic Illness Chronic Illness             Psychosocial Discharge (Final Psychosocial Re-Evaluation):  Psychosocial Re-Evaluation - 09/25/20 0749      Psychosocial Re-Evaluation   Current issues with Current Stress Concerns    Comments Letta Median has not voiced any increased concerns or stressors in phase 2 cardiac rehab    Expected Outcomes Letta Median will have decreased stressors upon completion of phase 2 cardiac rehab    Interventions Encouraged to attend Cardiac Rehabilitation for the exercise    Continue Psychosocial Services  No Follow up required      Initial Review   Source of Stress Concerns Chronic Illness           Vocational Rehabilitation: Provide vocational rehab assistance to qualifying candidates.    Vocational Rehab Evaluation & Intervention:  Vocational Rehab - 09/02/20 1549      Initial Vocational Rehab Evaluation & Intervention   Assessment shows need for Vocational Rehabilitation No   Tresea is currently working and does not need vocational rehab at this time          Education: Education Goals: Education classes will be provided on a weekly basis, covering required topics. Participant will state understanding/return demonstration of topics presented.  Learning Barriers/Preferences:  Learning Barriers/Preferences - 09/02/20 1548      Learning Barriers/Preferences   Learning Barriers Sight;Exercise Concerns   wears glasses reports feeling ligthheaded at times   Learning Preferences None           Education Topics: Hypertension, Hypertension Reduction -Define heart disease and high blood pressure. Discus how high blood pressure affects the body and ways to reduce high blood pressure.   Exercise and Your Heart -Discuss why it is important to exercise, the FITT principles of exercise, normal and abnormal responses to exercise, and how to exercise safely.   Angina -Discuss definition of angina, causes of angina, treatment of angina, and how to decrease risk of having angina.   Cardiac Medications -Review what the following cardiac medications are used for, how they affect the body, and side effects that may occur when taking the medications.  Medications include Aspirin, Beta blockers, calcium channel blockers, ACE Inhibitors, angiotensin receptor blockers, diuretics, digoxin, and antihyperlipidemics.   Congestive Heart Failure -Discuss the definition of CHF, how to live with CHF, the signs and symptoms of CHF, and how keep track of weight and sodium intake.   Heart Disease and Intimacy -Discus the effect sexual activity has on the heart, how changes occur during intimacy as we age, and safety during sexual activity.   Smoking Cessation / COPD -Discuss different  methods to quit smoking, the health benefits of quitting smoking, and  the definition of COPD.   Nutrition I: Fats -Discuss the types of cholesterol, what cholesterol does to the heart, and how cholesterol levels can be controlled.   Nutrition II: Labels -Discuss the different components of food labels and how to read food label   Heart Parts/Heart Disease and PAD -Discuss the anatomy of the heart, the pathway of blood circulation through the heart, and these are affected by heart disease.   Stress I: Signs and Symptoms -Discuss the causes of stress, how stress may lead to anxiety and depression, and ways to limit stress.   Stress II: Relaxation -Discuss different types of relaxation techniques to limit stress.   Warning Signs of Stroke / TIA -Discuss definition of a stroke, what the signs and symptoms are of a stroke, and how to identify when someone is having stroke.   Knowledge Questionnaire Score:  Knowledge Questionnaire Score - 09/02/20 1454      Knowledge Questionnaire Score   Pre Score 21/24           Core Components/Risk Factors/Patient Goals at Admission:  Personal Goals and Risk Factors at Admission - 09/02/20 1328      Core Components/Risk Factors/Patient Goals on Admission    Weight Management Obesity;Yes;Weight Loss    Intervention Weight Management/Obesity: Establish reasonable short term and long term weight goals.;Obesity: Provide education and appropriate resources to help participant work on and attain dietary goals.    Admit Weight 245 lb 13 oz (111.5 kg)    Expected Outcomes Short Term: Continue to assess and modify interventions until short term weight is achieved;Long Term: Adherence to nutrition and physical activity/exercise program aimed toward attainment of established weight goal;Weight Loss: Understanding of general recommendations for a balanced deficit meal plan, which promotes 1-2 lb weight loss per week and includes a negative energy balance  of [phone] kcal/d;Understanding recommendations for meals to include 15-35% energy as protein, 25-35% energy from fat, 35-60% energy from carbohydrates, less than 200mg  of dietary cholesterol, 20-35 gm of total fiber daily;Understanding of distribution of calorie intake throughout the day with the consumption of 4-5 meals/snacks    Diabetes Yes    Intervention Provide education about signs/symptoms and action to take for hypo/hyperglycemia.;Provide education about proper nutrition, including hydration, and aerobic/resistive exercise prescription along with prescribed medications to achieve blood glucose in normal ranges: Fasting glucose 65-99 mg/dL    Expected Outcomes Short Term: Participant verbalizes understanding of the signs/symptoms and immediate care of hyper/hypoglycemia, proper foot care and importance of medication, aerobic/resistive exercise and nutrition plan for blood glucose control.;Long Term: Attainment of HbA1C < 7%.    Hypertension Yes    Intervention Provide education on lifestyle modifcations including regular physical activity/exercise, weight management, moderate sodium restriction and increased consumption of fresh fruit, vegetables, and low fat dairy, alcohol moderation, and smoking cessation.;Monitor prescription use compliance.    Expected Outcomes Short Term: Continued assessment and intervention until BP is < 140/51mm HG in hypertensive participants. < 130/41mm HG in hypertensive participants with diabetes, heart failure or chronic kidney disease.;Long Term: Maintenance of blood pressure at goal levels.    Lipids Yes    Intervention Provide education and support for participant on nutrition & aerobic/resistive exercise along with prescribed medications to achieve LDL 70mg , HDL >40mg .    Expected Outcomes Short Term: Participant states understanding of desired cholesterol values and is compliant with medications prescribed. Participant is following exercise prescription and  nutrition guidelines.;Long Term: Cholesterol controlled with medications as prescribed, with individualized exercise RX and with personalized  nutrition plan. Value goals: LDL < 70mg , HDL > 40 mg.    Stress Yes    Intervention Offer individual and/or small group education and counseling on adjustment to heart disease, stress management and health-related lifestyle change. Teach and support self-help strategies.;Refer participants experiencing significant psychosocial distress to appropriate mental health specialists for further evaluation and treatment. When possible, include family members and significant others in education/counseling sessions.    Expected Outcomes Short Term: Participant demonstrates changes in health-related behavior, relaxation and other stress management skills, ability to obtain effective social support, and compliance with psychotropic medications if prescribed.;Long Term: Emotional wellbeing is indicated by absence of clinically significant psychosocial distress or social isolation.           Core Components/Risk Factors/Patient Goals Review:   Goals and Risk Factor Review    Row Name 09/18/20 1328 09/25/20 0751           Core Components/Risk Factors/Patient Goals Review   Personal Goals Review Weight Management/Obesity;Stress;Hypertension;Diabetes;Lipids Weight Management/Obesity;Stress;Hypertension;Diabetes;Lipids      Review Lyberti started exercise on 09/18/20 and did well. Vital signs were stable. CBG's were improved Letta Median is doing well with exercise. Faye's CBG's have been improved.      Expected Outcomes Patient will continue to participate in phase 2 cardiac rehab for exercise, nutrition and lifestyle modifications Patient will continue to participate in phase 2 cardiac rehab for exercise, nutrition and lifestyle modifications             Core Components/Risk Factors/Patient Goals at Discharge (Final Review):   Goals and Risk Factor Review - 09/25/20 0751       Core Components/Risk Factors/Patient Goals Review   Personal Goals Review Weight Management/Obesity;Stress;Hypertension;Diabetes;Lipids    Review Letta Median is doing well with exercise. Faye's CBG's have been improved.    Expected Outcomes Patient will continue to participate in phase 2 cardiac rehab for exercise, nutrition and lifestyle modifications           ITP Comments:  ITP Comments    Row Name 09/02/20 1342 09/25/20 0748         ITP Comments Dr Fransico Him MD, Medical Director 30 Day ITP Review. Letta Median has good participation in phase 2 cardiac rehab             Comments: See ITP Comments.Barnet Pall, RN,BSN 09/25/2020 12:37 PM

## 2020-09-26 ENCOUNTER — Ambulatory Visit (HOSPITAL_COMMUNITY): Payer: 59

## 2020-09-26 ENCOUNTER — Telehealth (HOSPITAL_COMMUNITY): Payer: Self-pay | Admitting: *Deleted

## 2020-09-26 ENCOUNTER — Encounter (HOSPITAL_COMMUNITY): Payer: 59

## 2020-09-26 NOTE — Telephone Encounter (Signed)
Patient called to say that she will miss cardiac rehab today due to an upset stomach. Patient advised that she will need to be symptom free at least 48 hours before return to exercise. Patient voices understanding.  Sol Passer, [place], ACSM Juanito Doom 09/26/2020 [phone]

## 2020-09-29 ENCOUNTER — Telehealth (HOSPITAL_COMMUNITY): Payer: Self-pay

## 2020-09-29 ENCOUNTER — Encounter (HOSPITAL_COMMUNITY): Payer: 59

## 2020-09-29 ENCOUNTER — Ambulatory Visit (HOSPITAL_COMMUNITY): Payer: 59

## 2020-09-29 NOTE — Telephone Encounter (Signed)
Cardiac Rehab Note:  Incomming telephone call from Rosita Fire, daughter of Amiee Wiley. De[place] stating patient was still not feeling well. C/O cough, headache, sinus congestion. Per daughter, Ms. Jannae Fagerstrom has an appointment with her PCP tomorrow and will discuss symptoms. Of note patient is fully vaccinated for Covid-19 however will discuss with PCP if patient needs testing since grandchild had same symptoms. Grandchild was not tested for covid.  Jakavion Bilodeau E. Rollene Rotunda RN, BSN [place]. [facility]  Cardiac and Pulmonary Rehabilitation Phone: [phone] Fax: [phone]

## 2020-10-01 ENCOUNTER — Telehealth (HOSPITAL_COMMUNITY): Payer: Self-pay | Admitting: *Deleted

## 2020-10-01 ENCOUNTER — Encounter (HOSPITAL_COMMUNITY): Payer: 59

## 2020-10-01 ENCOUNTER — Ambulatory Visit (HOSPITAL_COMMUNITY): Payer: 59

## 2020-10-01 NOTE — Telephone Encounter (Signed)
Left message to call cardiac rehab.Barnet Pall, RN,BSN 10/01/2020 10:53 AM

## 2020-10-03 ENCOUNTER — Ambulatory Visit (HOSPITAL_COMMUNITY): Payer: 59

## 2020-10-03 ENCOUNTER — Encounter (HOSPITAL_COMMUNITY): Payer: 59

## 2020-10-03 ENCOUNTER — Telehealth (HOSPITAL_COMMUNITY): Payer: Self-pay | Admitting: *Deleted

## 2020-10-03 NOTE — Telephone Encounter (Signed)
Patient's daughter Cindy [name] reported that [name] has a stomach virus that she caught from her children and will not be attending exercise at cardiac rehab today. Requested that Cindy [name] get tested for Covid 19 and provide results and to be symptom free  For 48 hours before returning to exercise at cardiac rehab. Patient's daughter says she will relay message to mother. Cindy [name] says that her mother has been in contact with her primary care physician about her symptoms.Barnet Pall, RN,BSN 10/03/2020 10:47 AM

## 2020-10-06 ENCOUNTER — Ambulatory Visit (HOSPITAL_COMMUNITY): Payer: 59

## 2020-10-06 ENCOUNTER — Encounter (HOSPITAL_COMMUNITY)
Admission: RE | Admit: 2020-10-06 | Discharge: 2020-10-06 | Disposition: A | Discharge status: Home / Self Care | Payer: 59 | Source: Ambulatory Visit | Attending: Internal Medicine | Admitting: Internal Medicine

## 2020-10-06 DIAGNOSIS — Z95820 Peripheral vascular angioplasty status with implants and grafts: Secondary | ICD-10-CM | POA: Insufficient documentation

## 2020-10-06 DIAGNOSIS — Z955 Presence of coronary angioplasty implant and graft: Secondary | ICD-10-CM | POA: Insufficient documentation

## 2020-10-08 ENCOUNTER — Encounter (HOSPITAL_COMMUNITY): Payer: 59

## 2020-10-08 ENCOUNTER — Ambulatory Visit (HOSPITAL_COMMUNITY): Payer: 59

## 2020-10-10 ENCOUNTER — Encounter (HOSPITAL_COMMUNITY): Payer: 59

## 2020-10-10 ENCOUNTER — Telehealth (HOSPITAL_COMMUNITY): Payer: Self-pay

## 2020-10-10 ENCOUNTER — Ambulatory Visit (HOSPITAL_COMMUNITY): Payer: 59

## 2020-10-10 NOTE — Telephone Encounter (Signed)
Spoke to [place] she plans on getting a COVID 19 test this evening. Cindy [name] hopes to return to exercise on Monday providing the test is negative. Cindy [name] will bring her results with her.Barnet Pall, RN,BSN 10/10/2020 11:13 AM

## 2020-10-13 ENCOUNTER — Encounter (HOSPITAL_COMMUNITY): Payer: 59

## 2020-10-13 ENCOUNTER — Ambulatory Visit (HOSPITAL_COMMUNITY): Payer: 59

## 2020-10-15 ENCOUNTER — Ambulatory Visit (HOSPITAL_COMMUNITY): Payer: 59

## 2020-10-15 ENCOUNTER — Encounter (HOSPITAL_COMMUNITY): Payer: 59

## 2020-10-17 ENCOUNTER — Encounter (HOSPITAL_COMMUNITY): Payer: 59

## 2020-10-17 ENCOUNTER — Ambulatory Visit (HOSPITAL_COMMUNITY): Payer: 59

## 2020-10-17 ENCOUNTER — Telehealth (HOSPITAL_COMMUNITY): Payer: Self-pay | Admitting: *Deleted

## 2020-10-17 NOTE — Telephone Encounter (Signed)
Left message to call cardiac rehab. Will discharge from the program. Faye's last exercise session was on 10/27/2. Faye attended 4 exercise sessions between 08/2020-09/24/20. Massie Maroon did well with exercise when in attendance.Barnet Pall, RN,BSN 10/17/2020 11:22 AM

## 2020-10-20 ENCOUNTER — Ambulatory Visit (HOSPITAL_COMMUNITY): Payer: 59

## 2020-10-20 ENCOUNTER — Encounter (HOSPITAL_COMMUNITY): Payer: 59

## 2020-10-22 ENCOUNTER — Ambulatory Visit (HOSPITAL_COMMUNITY): Payer: 59

## 2020-10-22 ENCOUNTER — Encounter (HOSPITAL_COMMUNITY): Payer: 59

## 2020-10-24 ENCOUNTER — Encounter (HOSPITAL_COMMUNITY): Payer: 59

## 2020-10-27 ENCOUNTER — Encounter (HOSPITAL_COMMUNITY): Payer: 59

## 2020-10-29 ENCOUNTER — Encounter (HOSPITAL_COMMUNITY): Payer: 59

## 2020-10-31 ENCOUNTER — Encounter (HOSPITAL_COMMUNITY): Payer: 59

## 2020-11-06 ENCOUNTER — Other Ambulatory Visit: Payer: Self-pay | Admitting: General Practice

## 2020-12-12 ENCOUNTER — Ambulatory Visit: Payer: 59 | Admitting: Internal Medicine

## 2020-12-31 ENCOUNTER — Other Ambulatory Visit: Payer: Self-pay | Admitting: Internal Medicine

## 2020-12-31 DIAGNOSIS — I2510 Atherosclerotic heart disease of native coronary artery without angina pectoris: Secondary | ICD-10-CM

## 2020-12-31 DIAGNOSIS — E7800 Pure hypercholesterolemia, unspecified: Secondary | ICD-10-CM

## 2021-01-02 ENCOUNTER — Ambulatory Visit: Payer: 59 | Admitting: Internal Medicine

## 2021-01-12 ENCOUNTER — Other Ambulatory Visit: Payer: Self-pay

## 2021-01-19 ENCOUNTER — Other Ambulatory Visit: Payer: Self-pay | Admitting: Cardiology

## 2021-02-13 ENCOUNTER — Other Ambulatory Visit: Payer: Self-pay | Admitting: General Practice

## 2021-02-20 ENCOUNTER — Other Ambulatory Visit: Payer: Self-pay | Admitting: General Practice

## 2021-03-09 ENCOUNTER — Other Ambulatory Visit: Payer: Self-pay | Admitting: Cardiology

## 2021-04-06 NOTE — Telephone Encounter (Signed)
Unable to reach patient.

## 2021-04-13 ENCOUNTER — Ambulatory Visit: Payer: 59 | Admitting: Endocrinology

## 2021-06-17 ENCOUNTER — Ambulatory Visit: Payer: 59 | Admitting: Endocrinology

## 2021-09-22 ENCOUNTER — Other Ambulatory Visit: Payer: Self-pay | Admitting: Internal Medicine

## 2021-10-14 ENCOUNTER — Telehealth: Payer: Self-pay | Admitting: Physician Assistant

## 2021-10-14 NOTE — Telephone Encounter (Signed)
Patient scheduled for tomorrow, 10/14/21 at 10:55 am to see Doreene Adas

## 2021-10-14 NOTE — Progress Notes (Signed)
Cardiology Office Note:    Date:  10/15/2021   ID:  [name], DOB [birth year], MRN [id number]  PCP:  Lin Landsman, MD  Cardiologist:  Elouise Munroe, MD   Referring MD: Lin Landsman, MD   Chief Complaint  Patient presents with   Follow-up    CAD, chest pain    History of Present Illness:    Cindy [name] is a 55 y.o. female with a hx of CVA, CAD, HTN, HLD, poorly controlled DM2, OSA on CPAP, GERD, and obesity.  She has a family history of premature heart disease: Father died at 87 with MI.  She suffered a CVA in 2019 and has been maintained on aspirin and Plavix.  She was admitted in August 2021 with chest pain.  Assisted a troponins were not revealing however given her history and risk factors definitive angiography was pursued, which resulted in DES x2 to the mid and distal LAD.  She has residual disease in the RCA, including 60% in the proximal vessel and 80% in the distal vessel that was treated medically.  LDL was 153 and her A1c was 12.3% at that time.  At follow-up, she reported she could not afford insulin and her insurance did not cover her current insulin prescription.  She presents today for routine follow-up.  She is here with her daughter.  They do describe an episode of chest pain at rest that required nitroglycerin x2.  She has not had a recurrence of this.  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  She is compliant on her CPAP.  Blood pressure is controlled here in the office.  She continues to work with Dr. Ayesha Rumpf for her diabetes.  She is also been out of Plavix for at least 1 month.   Past Medical History:  Diagnosis Date   CAD in native artery 07/09/2020   Coronary artery disease    Diabetes mellitus    GERD (gastroesophageal reflux disease)    Hyperlipemia    Hypertension    S/P angioplasty with stent 07/09/2020   Stroke [facility])     Past Surgical History:  Procedure Laterality Date   CARDIAC  CATHETERIZATION     CORONARY STENT INTERVENTION N/A 07/08/2020   Procedure: CORONARY STENT INTERVENTION;  Surgeon: Jettie Booze, MD;  Location: [place] CV LAB;  Service: Cardiovascular;  Laterality: N/A;   INTRAVASCULAR ULTRASOUND/IVUS N/A 07/08/2020   Procedure: Intravascular Ultrasound/IVUS;  Surgeon: Jettie Booze, MD;  Location: [place] [facility];  Service: Cardiovascular;  Laterality: N/A;   LEFT HEART CATH AND CORONARY ANGIOGRAPHY N/A 07/07/2020   Procedure: LEFT HEART CATH AND CORONARY ANGIOGRAPHY;  Surgeon: Lorretta Harp, MD;  Location: [place] [facility];  Service: Cardiovascular;  Laterality: N/A;   None      Current Medications: Current Meds  Medication Sig   acetaminophen (TYLENOL) 325 MG tablet Take 2 tablets (650 mg total) by mouth every 4 (four) hours as needed for headache or mild pain.   aspirin 81 MG chewable tablet Chew 1 tablet (81 mg total) by mouth daily.   Continuous Blood Gluc Sensor (FREESTYLE LIBRE 14 DAY SENSOR) MISC Apply 1 each topically every 14 (fourteen) days.    ezetimibe (ZETIA) 10 MG tablet Take 1 tablet by mouth once daily   glucose blood (FREESTYLE LITE) test strip 1 each by Other route as needed (check blood glucose).    hydrochlorothiazide (HYDRODIURIL) 25 MG tablet Take 1/2 (one-half)  tablet by mouth once daily   insulin aspart protamine- aspart (NOVOLOG MIX 70/30) (70-30) 100 UNIT/ML injection Inject 20 Units into the skin daily with breakfast.   Insulin Syringes, Disposable, U-100 0.5 ML MISC 30 Units by Does not apply route 2 (two) times daily with a meal.   LANTUS SOLOSTAR 100 UNIT/ML Solostar Pen Inject 30 Units into the skin at bedtime.   metoprolol succinate (TOPROL-XL) 25 MG 24 hr tablet Take 1 tablet by mouth daily   nitroGLYCERIN (NITROSTAT) 0.4 MG SL tablet Place 1 tablet (0.4 mg total) under the tongue every 5 (five) minutes as needed for chest pain.   NOVOLOG FLEXPEN RELION 100 UNIT/ML FlexPen Inject 30 Units into the  skin daily before supper.   pantoprazole (PROTONIX) 20 MG tablet Take 1 tablet by mouth once daily   rosuvastatin (CRESTOR) 40 MG tablet Take 1 tablet (40 mg total) by mouth daily.   sertraline (ZOLOFT) 50 MG tablet Take 1 tablet (50 mg total) by mouth at bedtime.   [DISCONTINUED] clopidogrel (PLAVIX) 75 MG tablet TAKE 1 TABLET BY MOUTH  DAILY WITH BREAKFAST   [DISCONTINUED] FARXIGA 10 MG TABS tablet Take 10 mg by mouth daily.   [DISCONTINUED] lisinopril (ZESTRIL) 40 MG tablet Take 1 tablet by mouth once daily     Allergies:   Other   Social History   Socioeconomic History   Marital status: Single    Spouse name: Not on file   Number of children: 2   Years of education: 12   Highest education level: Not on file  Occupational History   Occupation: [employer]- D[employer]  Tobacco Use   Smoking status: Never   Smokeless tobacco: Never  Vaping Use   Vaping Use: Never used  Substance and Sexual Activity   Alcohol use: No   Drug use: No   Sexual activity: Not Currently  Other Topics Concern   Not on file  Social History Narrative   Not on file   Social Determinants of Health   Financial Resource Strain: Not on file  Food Insecurity: Not on file  Transportation Needs: Not on file  Physical Activity: Not on file  Stress: Not on file  Social Connections: Not on file     Family History: The patient's family history includes Breast cancer in her maternal grandmother and mother; Diabetes in her mother; Heart failure in her father; Hypertension in her father and mother; Stroke in her brother and mother. There is no history of Colon cancer, Stomach cancer, Rectal cancer, Esophageal cancer, or Liver cancer.  ROS:   Please see the history of present illness.     All other systems reviewed and are negative. EKGs/Labs/Other Studies Reviewed:    The following studies were reviewed today:  07/07/20 cardiac cath  Prox RCA lesion is 60% stenosed. Mid RCA to Dist RCA lesion is 80%  stenosed. Prox LAD to Mid LAD lesion is 80% stenosed. Dist LAD lesion is 80% stenosed.   07/07/20 Echo  1. Left ventricular ejection fraction, by estimation, is 60 to 65%. The  left ventricle has normal function. The left ventricle has no regional  wall motion abnormalities. There is mild concentric left ventricular  hypertrophy. Left ventricular diastolic  parameters were normal.   2. Right ventricular systolic function is normal. The right ventricular  size is normal. Tricuspid regurgitation signal is inadequate for assessing  PA pressure.   3. The mitral valve is grossly normal. Trivial mitral valve  regurgitation. No evidence of mitral  stenosis.   4. The aortic valve is tricuspid. Aortic valve regurgitation is not  visualized. No aortic stenosis is present.   5. The inferior vena cava is normal in size with greater than 50%  respiratory variability, suggesting right atrial pressure of 3 mmHg.    EKG:  EKG is not ordered today.   Recent Labs: No results found for requested labs within last 8760 hours.  Recent Lipid Panel    Component Value Date/Time   CHOL 219 (H) 07/07/2020 0118   CHOL 158 12/27/2018 1044   TRIG 150 (H) 07/07/2020 0118   HDL 36 (L) 07/07/2020 0118   HDL 40 12/27/2018 1044   CHOLHDL 6.1 07/07/2020 0118   VLDL 30 07/07/2020 0118   LDLCALC 153 (H) 07/07/2020 0118   LDLCALC 100 (H) 12/27/2018 1044    Physical Exam:    VS:  BP 126/84   Pulse 80   Ht 5\' 3"  (1.6 m)   Wt 252 lb 6.4 oz (114.5 kg)   SpO2 99%   BMI 44.71 kg/m     Wt Readings from Last 3 Encounters:  10/15/21 252 lb 6.4 oz (114.5 kg)  09/11/20 242 lb 9.6 oz (110 kg)  09/02/20 245 lb 13 oz (111.5 kg)     GEN: obese female in no acute distress HEENT: Normal NECK: No JVD; No carotid bruits LYMPHATICS: No lymphadenopathy CARDIAC: RRR, no murmurs, rubs, gallops RESPIRATORY:  Clear to auscultation without rales, wheezing or rhonchi  ABDOMEN: Soft, non-tender,  non-distended MUSCULOSKELETAL:  No edema; No deformity  SKIN: Warm and dry NEUROLOGIC:  Alert and oriented x 3 PSYCHIATRIC:  Normal affect   ASSESSMENT:    1. Coronary artery disease involving native coronary artery of native heart without angina pectoris   2. Precordial chest pain   3. Pure hypercholesterolemia   4. Essential hypertension   5. Diabetes mellitus due to underlying condition with chronic kidney disease, without long-term current use of insulin, unspecified CKD stage ([place])   6. OSA on CPAP    PLAN:    In order of problems listed above:  CAD She is s/p PCI with DES x2 to LAD in 2021.  She has remained on aspirin and Plavix with history of CVA and now PCI.  Continue Toprol 25 mg, and high intensity statin plus Zetia.   Chest pain Unclear if this was angina, but likely.  She has been out of Plavix and this may have precipitated her event.  She has not had any exertional angina.  We will hold off on adding an antianginal such as amlodipine.  I will restart her Plavix in the setting of residual disease in the RCA and history of stroke.  She has had no bleeding problems.   Hyperlipidemia with LDL goal less than 70 Continue 40 mg Crestor and 10 mg Zetia. She has recently had her cholesterol checked with her PCP.  I have asked them to fax [place] those results.  It will be very important to control her LDL in the setting of uncontrolled diabetes.   Hypertension Maintained on 12.5 mg HCTZ, 40 mg lisinopril, beta-blocker.  Blood pressure is controlled in the office today.  No change in therapy.   DM with hyperglycemia A1c 12.3% in Aug 2021 Followed by PCP.   OSA on CPAP Compliant  Follow up with Dr. Margaretann Loveless in 6 months.   Medication Adjustments/Labs and Tests Ordered: Current medicines are reviewed at length with the patient today.  Concerns regarding medicines are outlined above.  No orders of the defined types were placed in this encounter.  Meds ordered this  encounter  Medications   clopidogrel (PLAVIX) 75 MG tablet    Sig: Take 1 tablet (75 mg total) by mouth daily with breakfast.    Dispense:  90 tablet    Refill:  2    PLEASE SCHEDULE AN APPT FOR FUTURE REFILLS   FARXIGA 10 MG TABS tablet    Sig: Take 1 tablet (10 mg total) by mouth daily.    Dispense:  30 tablet    Refill:  6   lisinopril (ZESTRIL) 40 MG tablet    Sig: Take 1 tablet (40 mg total) by mouth daily.    Dispense:  90 tablet    Refill:  2    Signed, Ledora Bottcher, [place]  10/15/2021 11:36 AM    [facility]

## 2021-10-15 ENCOUNTER — Ambulatory Visit (INDEPENDENT_AMBULATORY_CARE_PROVIDER_SITE_OTHER): Payer: 59 | Admitting: Physician Assistant

## 2021-10-15 ENCOUNTER — Other Ambulatory Visit: Payer: Self-pay

## 2021-10-15 ENCOUNTER — Encounter: Payer: Self-pay | Admitting: Physician Assistant

## 2021-10-15 VITALS — BP 126/84 | HR 80 | Ht 63.0 in | Wt 252.4 lb

## 2021-10-15 DIAGNOSIS — E7800 Pure hypercholesterolemia, unspecified: Secondary | ICD-10-CM

## 2021-10-15 DIAGNOSIS — I2510 Atherosclerotic heart disease of native coronary artery without angina pectoris: Secondary | ICD-10-CM

## 2021-10-15 DIAGNOSIS — E0822 Diabetes mellitus due to underlying condition with diabetic chronic kidney disease: Secondary | ICD-10-CM

## 2021-10-15 DIAGNOSIS — R072 Precordial pain: Secondary | ICD-10-CM | POA: Diagnosis not present

## 2021-10-15 DIAGNOSIS — Z9989 Dependence on other enabling machines and devices: Secondary | ICD-10-CM

## 2021-10-15 DIAGNOSIS — I10 Essential (primary) hypertension: Secondary | ICD-10-CM | POA: Diagnosis not present

## 2021-10-15 DIAGNOSIS — G4733 Obstructive sleep apnea (adult) (pediatric): Secondary | ICD-10-CM

## 2021-10-15 MED ORDER — CLOPIDOGREL BISULFATE 75 MG PO TABS
75.0000 mg | ORAL_TABLET | Freq: Every day | ORAL | 2 refills | Status: DC
Start: 2021-10-15 — End: 2022-06-10

## 2021-10-15 MED ORDER — FARXIGA 10 MG PO TABS
10.0000 mg | ORAL_TABLET | Freq: Every day | ORAL | 6 refills | Status: DC
Start: 2021-10-15 — End: 2022-06-10

## 2021-10-15 MED ORDER — LISINOPRIL 40 MG PO TABS
40.0000 mg | ORAL_TABLET | Freq: Every day | ORAL | 2 refills | Status: DC
Start: 2021-10-15 — End: 2022-06-10

## 2021-10-15 NOTE — Patient Instructions (Signed)
Medication Instructions:  No Changes *If you need a refill on your cardiac medications before your next appointment, please call your pharmacy*   Lab Work: No Labs If you have labs (blood work) drawn today and your tests are completely normal, you will receive your results only by: [place] (if you have MyChart) OR A paper copy in the mail If you have any lab test that is abnormal or we need to change your treatment, we will call you to review the results.   Testing/Procedures: No Testing   Follow-Up: At [facility], you and your health needs are our priority.  As part of our continuing mission to provide you with exceptional heart care, we have created designated Provider Care Teams.  These Care Teams include your primary Cardiologist (physician) and Advanced Practice Providers (APPs -  Physician Assistants and Nurse Practitioners) who all work together to provide you with the care you need, when you need it.  We recommend signing up for the patient portal called "MyChart".  Sign up information is provided on this After Visit Summary.  MyChart is used to connect with patients for Virtual Visits (Telemedicine).  Patients are able to view lab/test results, encounter notes, upcoming appointments, etc.  Non-urgent messages can be sent to your provider as well.   To learn more about what you can do with MyChart, go to [email or web address].    Your next appointment:   6 month(s)  The format for your next appointment:   In Person  Provider:   Elouise Munroe, MD

## 2021-10-16 ENCOUNTER — Ambulatory Visit: Payer: 59 | Admitting: Physician Assistant

## 2021-10-30 ENCOUNTER — Other Ambulatory Visit: Payer: Self-pay | Admitting: Internal Medicine

## 2021-12-28 ENCOUNTER — Other Ambulatory Visit: Payer: Self-pay | Admitting: Internal Medicine

## 2022-01-04 ENCOUNTER — Other Ambulatory Visit: Payer: Self-pay

## 2022-01-04 MED ORDER — METOPROLOL SUCCINATE ER 25 MG PO TB24: 25.0000 mg | ORAL_TABLET | Freq: Every day | ORAL | 9 refills | Status: DC

## 2022-01-19 ENCOUNTER — Other Ambulatory Visit: Payer: Self-pay | Admitting: Internal Medicine

## 2022-01-28 ENCOUNTER — Other Ambulatory Visit: Payer: Self-pay

## 2022-01-28 MED ORDER — PANTOPRAZOLE SODIUM 20 MG PO TBEC: 20.0000 mg | DELAYED_RELEASE_TABLET | Freq: Every day | ORAL | 0 refills | Status: DC

## 2022-02-02 ENCOUNTER — Encounter (HOSPITAL_COMMUNITY): Payer: Self-pay

## 2022-02-02 ENCOUNTER — Other Ambulatory Visit: Payer: Self-pay

## 2022-02-02 ENCOUNTER — Ambulatory Visit (HOSPITAL_COMMUNITY)
Admission: EM | Admit: 2022-02-02 | Discharge: 2022-02-02 | Disposition: A | Discharge status: Home / Self Care | Payer: 59

## 2022-02-02 ENCOUNTER — Emergency Department (HOSPITAL_COMMUNITY): Payer: 59

## 2022-02-02 ENCOUNTER — Emergency Department (HOSPITAL_COMMUNITY)
Admission: EM | Admit: 2022-02-02 | Discharge: 2022-02-03 | Disposition: A | Discharge status: Home / Self Care | Payer: 59 | Attending: Emergency Medicine | Admitting: Emergency Medicine

## 2022-02-02 DIAGNOSIS — E119 Type 2 diabetes mellitus without complications: Secondary | ICD-10-CM | POA: Insufficient documentation

## 2022-02-02 DIAGNOSIS — Z7982 Long term (current) use of aspirin: Secondary | ICD-10-CM | POA: Insufficient documentation

## 2022-02-02 DIAGNOSIS — R0602 Shortness of breath: Secondary | ICD-10-CM | POA: Diagnosis not present

## 2022-02-02 DIAGNOSIS — I2510 Atherosclerotic heart disease of native coronary artery without angina pectoris: Secondary | ICD-10-CM | POA: Diagnosis not present

## 2022-02-02 DIAGNOSIS — R42 Dizziness and giddiness: Secondary | ICD-10-CM | POA: Insufficient documentation

## 2022-02-02 DIAGNOSIS — I10 Essential (primary) hypertension: Secondary | ICD-10-CM | POA: Insufficient documentation

## 2022-02-02 DIAGNOSIS — Z5321 Procedure and treatment not carried out due to patient leaving prior to being seen by health care provider: Secondary | ICD-10-CM | POA: Insufficient documentation

## 2022-02-02 DIAGNOSIS — R079 Chest pain, unspecified: Secondary | ICD-10-CM | POA: Insufficient documentation

## 2022-02-02 LAB — BASIC METABOLIC PANEL
Anion gap: 11 (ref 5–15)
BUN: 16 mg/dL (ref 6–20)
CO2: 24 mmol/L (ref 22–32)
Calcium: 9 mg/dL (ref 8.9–10.3)
Chloride: 103 mmol/L (ref 98–111)
Creatinine, Ser: 0.91 mg/dL (ref 0.44–1.00)
GFR, Estimated: >60 mL/min (ref >60)
Glucose, Bld: 432 mg/dL — ABNORMAL HIGH (ref 70–99)
Potassium: 3.8 mmol/L (ref 3.5–5.1)
Sodium: 138 mmol/L (ref 135–145)

## 2022-02-02 LAB — CBC
HCT: 36.2 % (ref 36.0–46.0)
Hemoglobin: 10.9 g/dL — ABNORMAL LOW (ref 12.0–15.0)
MCH: 27.7 pg (ref 26.0–34.0)
MCHC: 30.1 g/dL (ref 30.0–36.0)
MCV: 91.9 fL (ref 80.0–100.0)
Platelets: 423 10*3/uL — ABNORMAL HIGH (ref 150–400)
RBC: 3.94 MIL/uL (ref 3.87–5.11)
RDW: 15.9 % — ABNORMAL HIGH (ref 11.5–15.5)
WBC: 8.9 10*3/uL (ref 4.0–10.5)
nRBC: 0 % (ref 0.0–0.2)

## 2022-02-02 LAB — TROPONIN I (HIGH SENSITIVITY): Troponin I (High Sensitivity): 7 ng/L (ref <18)

## 2022-02-02 LAB — CBG MONITORING, ED: Glucose-Capillary: 272 mg/dL — ABNORMAL HIGH (ref 70–99)

## 2022-02-02 NOTE — ED Notes (Signed)
Patient is being discharged from the Urgent Care and sent to the Emergency Department via POV . Per Dr. Mannie Stabile, patient is in need of higher level of care due to chest pain. Patient is aware and verbalizes understanding of plan of care.  ?Vitals:  ? 02/02/22 1658  ?BP: (!) 164/86  ?Pulse: 98  ?Resp: 18  ?Temp: 98.3 ?F (36.8 ?C)  ?SpO2: 96%  ?  ?

## 2022-02-02 NOTE — ED Provider Triage Note (Signed)
Emergency Medicine Provider Triage Evaluation Note ? ?Cindy [name] , a 56 y.o. female  was evaluated in triage.  Pt complains of continued chest pain and dizziness since name today.  Radiates to the left arm and both sides of her mid back.  Chest pain is reproducible with palpation.  Denies syncope.  Endorses mild shortness of breath/chest pressure.  Took 3 tabs of 500 mg aspirin.  Denies fever or recent illness.  Hx of heart cath and angio, DM type II, CAD, prior stroke, HTN, coronary stents. ? ?Review of Systems  ?Positive: As above ?Negative: As above ? ?Physical Exam  ?BP (!) 173/88 (BP Location: Right Arm)   Pulse 88   Temp 98 ?F (36.7 ?C) (Oral)   Resp 18   SpO2 100%  ?Gen:   Awake, no distress   ?Resp:  Normal effort CTAB ?MSK:   Moves extremities without difficulty  ?Other:  Heart RRR without M/R/G, afebrile ? ?Medical Decision Making  ?Medically screening exam initiated at 7:15 PM.  Appropriate orders placed.  Cindy [name] was informed that the remainder of the evaluation will be completed by another provider, this initial triage assessment does not replace that evaluation, and the importance of remaining in the ED until their evaluation is complete. ? ?Labs, imaging, EKG ordered ?  ?Prince Rome, PA-C ?[birth year] [id number] ? ?

## 2022-02-02 NOTE — ED Triage Notes (Signed)
Pt c/o centralized chest pain radiating across chest through to back and radiating down lt arm since 1130am. States having dizziness, lightheaded, and SOB. States took body back pain with little relief. Hx of stent placement in 2020. States has nitro but didn't take it. ?

## 2022-02-02 NOTE — ED Triage Notes (Signed)
Pt from UC for eval of central chest pain with radiation to back and L arm since 1130 today. Has associated dizziness and sob.  ?

## 2022-02-03 NOTE — ED Notes (Signed)
Pt decided to leave while waiting for a room.  

## 2022-02-04 ENCOUNTER — Other Ambulatory Visit: Payer: Self-pay | Admitting: Internal Medicine

## 2022-02-04 DIAGNOSIS — E7800 Pure hypercholesterolemia, unspecified: Secondary | ICD-10-CM

## 2022-02-04 DIAGNOSIS — I2510 Atherosclerotic heart disease of native coronary artery without angina pectoris: Secondary | ICD-10-CM

## 2022-04-12 ENCOUNTER — Encounter: Payer: Self-pay | Admitting: Gastroenterology

## 2022-05-09 ENCOUNTER — Other Ambulatory Visit: Payer: Self-pay | Admitting: Internal Medicine

## 2022-06-10 ENCOUNTER — Encounter: Payer: Self-pay | Admitting: Student

## 2022-06-10 ENCOUNTER — Ambulatory Visit (INDEPENDENT_AMBULATORY_CARE_PROVIDER_SITE_OTHER): Payer: 59 | Admitting: Student

## 2022-06-10 ENCOUNTER — Other Ambulatory Visit: Payer: Self-pay

## 2022-06-10 ENCOUNTER — Other Ambulatory Visit (HOSPITAL_COMMUNITY): Payer: Self-pay

## 2022-06-10 VITALS — BP 129/91 | HR 73 | Temp 97.7°F | Ht 63.0 in | Wt 235.4 lb

## 2022-06-10 DIAGNOSIS — Z794 Long term (current) use of insulin: Secondary | ICD-10-CM | POA: Diagnosis not present

## 2022-06-10 DIAGNOSIS — E118 Type 2 diabetes mellitus with unspecified complications: Secondary | ICD-10-CM | POA: Diagnosis not present

## 2022-06-10 DIAGNOSIS — E785 Hyperlipidemia, unspecified: Secondary | ICD-10-CM | POA: Diagnosis not present

## 2022-06-10 LAB — GLUCOSE, CAPILLARY: Glucose-Capillary: 341 mg/dL — ABNORMAL HIGH (ref 70–99)

## 2022-06-10 LAB — POCT GLYCOSYLATED HEMOGLOBIN (HGB A1C): HbA1c POC (<> result, manual entry): >14 % (ref 4.0–5.6)

## 2022-06-10 MED ORDER — INSULIN GLARGINE 100 UNIT/ML ~~LOC~~ SOLN
40.0000 [IU] | Freq: Every day | SUBCUTANEOUS | 0 refills | Status: DC
  Filled 2022-06-10: qty 12, 30d supply, fill #0
  Filled 2022-12-04: qty 20, 50d supply, fill #0

## 2022-06-10 MED ORDER — LISINOPRIL 40 MG PO TABS
40.0000 mg | ORAL_TABLET | Freq: Every day | ORAL | 2 refills | Status: DC
  Filled 2022-06-10: qty 90, 90d supply, fill #0

## 2022-06-10 MED ORDER — CLOPIDOGREL BISULFATE 75 MG PO TABS
75.0000 mg | ORAL_TABLET | Freq: Every day | ORAL | 11 refills | Status: DC
  Filled 2022-06-10: qty 30, 30d supply, fill #0

## 2022-06-10 MED ORDER — PEN NEEDLES 32G X 4 MM MISC
1.0000 | Freq: Once | 3 refills | Status: AC
  Filled 2022-06-10: qty 100, 90d supply, fill #0

## 2022-06-10 MED ORDER — FARXIGA 10 MG PO TABS
10.0000 mg | ORAL_TABLET | Freq: Every day | ORAL | 6 refills | Status: DC
  Filled 2022-06-10: qty 30, 30d supply, fill #0

## 2022-06-10 MED ORDER — ATORVASTATIN CALCIUM 80 MG PO TABS
80.0000 mg | ORAL_TABLET | Freq: Every day | ORAL | 3 refills | Status: DC
  Filled 2022-06-10: qty 30, 30d supply, fill #0

## 2022-06-10 MED ORDER — METOPROLOL TARTRATE 25 MG PO TABS
25.0000 mg | ORAL_TABLET | Freq: Two times a day (BID) | ORAL | 11 refills | Status: DC
  Filled 2022-06-10: qty 60, 30d supply, fill #0

## 2022-06-10 MED ORDER — FREESTYLE LIBRE 3 SENSOR MISC
2.0000 | Freq: Every day | 2 refills | Status: DC
  Filled 2022-06-10 – 2022-12-04 (×2): qty 2, 28d supply, fill #0

## 2022-06-10 NOTE — Progress Notes (Signed)
CC: Initiation of care  HPI:  [name] is a 56 y.o. female living with a history stated below and presents today for starting care, as well as managing her T2DM, Hyperlipidemia, and CAD. Please see problem based assessment and plan for additional details.  Past Medical History:  Diagnosis Date   CAD in native artery 07/09/2020   Coronary artery disease    Diabetes mellitus    GERD (gastroesophageal reflux disease)    Hyperlipemia    Hypertension    S/P angioplasty with stent 07/09/2020   Stroke [facility])     Current Outpatient Medications on File Prior to Visit  Medication Sig Dispense Refill   acetaminophen (TYLENOL) 325 MG tablet Take 2 tablets (650 mg total) by mouth every 4 (four) hours as needed for headache or mild pain.     aspirin 81 MG chewable tablet Chew 1 tablet (81 mg total) by mouth daily.     ezetimibe (ZETIA) 10 MG tablet Take 1 tablet by mouth once daily 90 tablet 3   glucose blood (FREESTYLE LITE) test strip 1 each by Other route as needed (check blood glucose).      hydrochlorothiazide (HYDRODIURIL) 25 MG tablet Take 1/2 (one-half) tablet by mouth once daily 15 tablet 6   Insulin Syringes, Disposable, U-100 0.5 ML MISC 30 Units by Does not apply route 2 (two) times daily with a meal. 100 each 3   nitroGLYCERIN (NITROSTAT) 0.4 MG SL tablet Place 1 tablet (0.4 mg total) under the tongue every 5 (five) minutes as needed for chest pain. 25 tablet 4   NOVOLOG FLEXPEN RELION 100 UNIT/ML FlexPen Inject 30 Units into the skin daily before supper.     pantoprazole (PROTONIX) 20 MG tablet Take 1 tablet (20 mg total) by mouth daily. 90 tablet 0   sertraline (ZOLOFT) 50 MG tablet Take 1 tablet (50 mg total) by mouth at bedtime. 90 tablet 0   [DISCONTINUED] metFORMIN (GLUCOPHAGE) 1000 MG tablet Take 1,000 mg by mouth 2 (two) times daily with a meal.     No current facility-administered medications on file prior to visit.    Family History  Problem Relation Age of  Onset   Diabetes Mother    Hypertension Mother    Breast cancer Mother    Stroke Mother    Hypertension Father    Heart failure Father    Breast cancer Maternal Grandmother    Stroke Brother    Colon cancer Neg Hx    Stomach cancer Neg Hx    Rectal cancer Neg Hx    Esophageal cancer Neg Hx    Liver cancer Neg Hx     Social History   Socioeconomic History   Marital status: Single    Spouse name: Not on file   Number of children: 2   Years of education: 12   Highest education level: Not on file  Occupational History   Occupation: [employer]  Tobacco Use   Smoking status: Never   Smokeless tobacco: Never  Vaping Use   Vaping Use: Never used  Substance and Sexual Activity   Alcohol use: No   Drug use: No   Sexual activity: Not Currently  Other Topics Concern   Not on file  Social History Narrative   Not on file   Social Determinants of Health   Financial Resource Strain: Not on file  Food Insecurity: Not on file  Transportation Needs: Not on file  Physical Activity: Not on file  Stress: Not on file  Social Connections: Not on file  Intimate Partner Violence: Not on file    Review of Systems: ROS negative except for what is noted on the assessment and plan.  Vitals:   06/10/22 1103  BP: (!) 129/91  Pulse: 73  Temp: 97.7 F (36.5 C)  TempSrc: Oral  SpO2: 97%  Weight: 235 lb 6.4 oz (106.8 kg)  Height: '5\' 3"'$  (1.6 m)    Physical Exam: Constitutional: Obese woman, sitting comfortably, in no acute distress HENT: normocephalic atraumatic, mucous membranes moist Eyes: conjunctiva non-erythematous Neck: supple Cardiovascular: regular rate and rhythm, no m/r/g, distal pulses +1 Pulmonary/Chest: normal work of breathing on room air, lungs clear to auscultation bilaterally,  MSK: Both feet cold to touch Skin: warm and dry   Assessment & Plan:   Type 2 diabetes mellitus with complication, with long-term current use of insulin (HCC) Patient's  A1c today was above 14, previous to this her last A1c was taking on August 2021, and had a value of 12.3.  Patient endorses polyuria and polydipsia.  And due to insurance restraints patient was only taking Farxiga 10 mg daily.  She states that at her last clinic that she was going to, she would just be given samples of Soliqua, however had many side effects (such as dizziness, nausea, vomiting) to the medication.  The Willeen Niece would also run out within a week.  Patient has had uncontrolled diabetes for almost 2 years now, and has not been able to see a physician about it.  Close monitoring of her sugar levels is very important.  On physical exam feet showed no ulcerations, however distal pulses were +1.  Plan: 1.  We will start patient on a new diabetes regimen including Lantus 100 units, and Farxiga 10 mg daily.  Also prescribed a glucose monitor for the patient, and will evaluate the readings at next visit.  2.  Encouraged patient to start a better diet, as well as incorporate exercise as tolerated into her routine  3.  We also did a BMP today in the clinic to assess kidney function because of her uncontrolled diabetes, and will update patient on results  4.  I placed a referral for ophthalmology due to her uncontrolled diabetes for maintenance.  HLD (hyperlipidemia) Based off of her last labs and August 2021, her cholesterol was 219, LDL was 153, and triglycerides was 150.  Based off of these numbers her ASCVD risk is 19.6%.  Patient also has a history of coronary artery disease, and had a cath placement in the distal LAD in August 2021.  Her last ejection fraction was also done and an echo in August 2021, which showed 60 to 65%.  Patient denies any intermittent claudication, or current chest pain.  Distal pulses are also very weak.  She is currently on aspirin 81 mg, Plavix 75 mg, metoprolol 25 mg, lisinopril 40 mg, and nitroglycerin as needed 0.4 mg.  Plan:  1.  Due to patient's high ASCVD  risk, it is very important to monitor her lipid levels and cholesterol levels.  Lipid panel was done today in the clinic, will update patient on results as soon as they are available and evaluate if additional therapy is needed.  2.  Due to her history and last labs, I changed her Crestor 40 mg to atorvastatin 80 mg.  We will evaluate how this change works at next visit.  3.  I refilled the Plavix 75 mg, metoprolol 25 mg, and lisinopril 40  mg.   Patient seen with Dr. Charissa Bash Tiziana Cislo, M.D. [facility], PGY-1 Phone: [phone] Date 06/10/2022 Time 7:00 PM

## 2022-06-10 NOTE — Assessment & Plan Note (Addendum)
Based off of her last labs and August 2021, her cholesterol was 219, LDL was 153, and triglycerides was 150.  Based off of these numbers her ASCVD risk is 19.6%.  Patient also has a history of coronary artery disease, and had a cath placement in the distal LAD in August 2021.  Her last ejection fraction was also done and an echo in August 2021, which showed 60 to 65%.  Patient denies any intermittent claudication, or current chest pain.  Distal pulses are also very weak.  She is currently on aspirin 81 mg, Plavix 75 mg, metoprolol 25 mg, lisinopril 40 mg, and nitroglycerin as needed 0.4 mg.  Plan:  1.  Due to patient's high ASCVD risk, it is very important to monitor her lipid levels and cholesterol levels.  Lipid panel was done today in the clinic, will update patient on results as soon as they are available and evaluate if additional therapy is needed.  2.  Due to her history and last labs, I changed her Crestor 40 mg to atorvastatin 80 mg.  We will evaluate how this change works at next visit.  3.  I refilled the Plavix 75 mg, metoprolol 25 mg, and lisinopril 40 mg.

## 2022-06-10 NOTE — Assessment & Plan Note (Addendum)
Patient's A1c today was above 14, previous to this her last A1c was taking on August 2021, and had a value of 12.3.  Patient endorses polyuria and polydipsia.  And due to insurance restraints patient was only taking Farxiga 10 mg daily.  She states that at her last clinic that she was going to, she would just be given samples of Soliqua, however had many side effects (such as dizziness, nausea, vomiting) to the medication.  The Willeen Niece would also run out within a week.  Patient has had uncontrolled diabetes for almost 2 years now, and has not been able to see a physician about it.  Close monitoring of her sugar levels is very important.  On physical exam feet showed no ulcerations, however distal pulses were +1.  Plan: 1.  We will start patient on a new diabetes regimen including Lantus 40 units, and Farxiga 10 mg daily.  Also prescribed a glucose monitor for the patient, and will evaluate the readings at next visit.  2.  Encouraged patient to start a better diet, as well as incorporate exercise as tolerated into her routine  3.  We also did a BMP today in the clinic to assess kidney function because of her uncontrolled diabetes, and will update patient on results  4.  I placed a referral for ophthalmology due to her uncontrolled diabetes for maintenance.

## 2022-06-10 NOTE — Patient Instructions (Signed)
Thank you so much for coming into the clinic today! We talked about a few things, here is a quick summary:   We need to get a handle on your diabetes, so we're going to give you some insulin (Lantus), a monitor, and refill your farxiga   2. We're getting some labs done today to get a better understanding of your cholesterol and diabetes  3. We're changing one of your cholesterol medications to a different one called Atorvastatin!  If you have any questions at all please don't hesitate to call the clinic at [phone]

## 2022-06-11 ENCOUNTER — Other Ambulatory Visit (HOSPITAL_COMMUNITY): Payer: Self-pay

## 2022-06-11 LAB — LIPID PANEL
Chol/HDL Ratio: 5.5 ratio — ABNORMAL HIGH (ref 0.0–4.4)
Cholesterol, Total: 221 mg/dL — ABNORMAL HIGH (ref 100–199)
HDL: 40 mg/dL (ref >39)
LDL Chol Calc (NIH): 161 mg/dL — ABNORMAL HIGH (ref 0–99)
Triglycerides: 113 mg/dL (ref 0–149)
VLDL Cholesterol Cal: 20 mg/dL (ref 5–40)

## 2022-06-11 LAB — BMP8+ANION GAP
Anion Gap: 16 mmol/L (ref 10.0–18.0)
BUN/Creatinine Ratio: 19 (ref 9–23)
BUN: 16 mg/dL (ref 6–24)
CO2: 21 mmol/L (ref 20–29)
Calcium: 9.2 mg/dL (ref 8.7–10.2)
Chloride: 100 mmol/L (ref 96–106)
Creatinine, Ser: 0.85 mg/dL (ref 0.57–1.00)
Glucose: 327 mg/dL — ABNORMAL HIGH (ref 70–99)
Potassium: 4.5 mmol/L (ref 3.5–5.2)
Sodium: 137 mmol/L (ref 134–144)
eGFR: 80 mL/min/{1.73_m2} (ref >59)

## 2022-06-11 NOTE — Progress Notes (Signed)
Internal Medicine Clinic Attending  I saw and evaluated the patient.  I personally confirmed the key portions of the history and exam documented by Dr. Sanjuana Mae and I reviewed pertinent patient test results.  The assessment, diagnosis, and plan were formulated together and I agree with the documentation in the resident's note.   New patient with uncontrolled diabetes due to issues with consistent access to medications. We are working to start a consistent regimen. She was intolerant of metformin. Will start with Farxiga '10mg'$  and Lantus 40 units daily (previously on soliqua 60 units). Low risk for hypoglycemia. Will call her next week to follow up that she was able to access the meds and supplies. Follow up in one month. I anticipate she would do well with a weekly GLP1 agonist in the future, and likely to need mealtime insulin as well.

## 2022-06-11 NOTE — Addendum Note (Signed)
Addended byDrucie Opitz on: 06/11/2022 11:47 AM   Modules accepted: Orders

## 2022-06-11 NOTE — Addendum Note (Signed)
Addended by: Lalla Brothers T on: 06/11/2022 10:28 AM   Modules accepted: Level of Service

## 2022-06-14 ENCOUNTER — Telehealth: Payer: Self-pay | Admitting: Student

## 2022-06-14 ENCOUNTER — Other Ambulatory Visit (HOSPITAL_COMMUNITY): Payer: Self-pay

## 2022-06-14 NOTE — Telephone Encounter (Signed)
Spoke with patient on the phone to discuss lab results and medications. She says she wasn't able to get her medications yet because she was calling the incorrect pharmacy, will follow up with her again today to ensure medications were received.

## 2022-06-16 ENCOUNTER — Other Ambulatory Visit (HOSPITAL_COMMUNITY): Payer: Self-pay

## 2022-06-21 ENCOUNTER — Other Ambulatory Visit (HOSPITAL_COMMUNITY): Payer: Self-pay

## 2022-07-03 ENCOUNTER — Emergency Department (HOSPITAL_BASED_OUTPATIENT_CLINIC_OR_DEPARTMENT_OTHER)
Admission: EM | Admit: 2022-07-03 | Discharge: 2022-07-03 | Disposition: A | Discharge status: Home / Self Care | Payer: Commercial Managed Care - HMO | Attending: Emergency Medicine | Admitting: Emergency Medicine

## 2022-07-03 ENCOUNTER — Encounter (HOSPITAL_BASED_OUTPATIENT_CLINIC_OR_DEPARTMENT_OTHER): Payer: Self-pay | Admitting: Emergency Medicine

## 2022-07-03 ENCOUNTER — Other Ambulatory Visit: Payer: Self-pay

## 2022-07-03 DIAGNOSIS — R300 Dysuria: Secondary | ICD-10-CM | POA: Insufficient documentation

## 2022-07-03 DIAGNOSIS — Z7982 Long term (current) use of aspirin: Secondary | ICD-10-CM | POA: Diagnosis not present

## 2022-07-03 DIAGNOSIS — Z794 Long term (current) use of insulin: Secondary | ICD-10-CM | POA: Diagnosis not present

## 2022-07-03 DIAGNOSIS — E119 Type 2 diabetes mellitus without complications: Secondary | ICD-10-CM | POA: Diagnosis not present

## 2022-07-03 DIAGNOSIS — Z7984 Long term (current) use of oral hypoglycemic drugs: Secondary | ICD-10-CM | POA: Insufficient documentation

## 2022-07-03 DIAGNOSIS — Z79899 Other long term (current) drug therapy: Secondary | ICD-10-CM | POA: Insufficient documentation

## 2022-07-03 DIAGNOSIS — B3731 Acute candidiasis of vulva and vagina: Secondary | ICD-10-CM | POA: Diagnosis not present

## 2022-07-03 DIAGNOSIS — I10 Essential (primary) hypertension: Secondary | ICD-10-CM | POA: Diagnosis not present

## 2022-07-03 DIAGNOSIS — I2510 Atherosclerotic heart disease of native coronary artery without angina pectoris: Secondary | ICD-10-CM | POA: Insufficient documentation

## 2022-07-03 LAB — URINALYSIS, ROUTINE W REFLEX MICROSCOPIC
Bilirubin Urine: NEGATIVE
Glucose, UA: >1000 mg/dL — AB
Hgb urine dipstick: NEGATIVE
Ketones, ur: 15 mg/dL — AB
Leukocytes,Ua: NEGATIVE
Nitrite: NEGATIVE
Protein, ur: NEGATIVE mg/dL
Specific Gravity, Urine: 1.03 (ref 1.005–1.030)
pH: 5 (ref 5.0–8.0)

## 2022-07-03 LAB — COMPREHENSIVE METABOLIC PANEL
ALT: 8 U/L (ref 0–44)
AST: 8 U/L — ABNORMAL LOW (ref 15–41)
Albumin: 3.9 g/dL (ref 3.5–5.0)
Alkaline Phosphatase: 83 U/L (ref 38–126)
Anion gap: 14 (ref 5–15)
BUN: 19 mg/dL (ref 6–20)
CO2: 25 mmol/L (ref 22–32)
Calcium: 9.5 mg/dL (ref 8.9–10.3)
Chloride: 99 mmol/L (ref 98–111)
Creatinine, Ser: 0.75 mg/dL (ref 0.44–1.00)
GFR, Estimated: >60 mL/min (ref >60)
Glucose, Bld: 304 mg/dL — ABNORMAL HIGH (ref 70–99)
Potassium: 3.8 mmol/L (ref 3.5–5.1)
Sodium: 138 mmol/L (ref 135–145)
Total Bilirubin: 0.2 mg/dL — ABNORMAL LOW (ref 0.3–1.2)
Total Protein: 8.4 g/dL — ABNORMAL HIGH (ref 6.5–8.1)

## 2022-07-03 LAB — CBC WITH DIFFERENTIAL/PLATELET
Abs Immature Granulocytes: 0.04 10*3/uL (ref 0.00–0.07)
Basophils Absolute: 0 10*3/uL (ref 0.0–0.1)
Basophils Relative: 0 %
Eosinophils Absolute: 0.1 10*3/uL (ref 0.0–0.5)
Eosinophils Relative: 1 %
HCT: 31.4 % — ABNORMAL LOW (ref 36.0–46.0)
Hemoglobin: 9.5 g/dL — ABNORMAL LOW (ref 12.0–15.0)
Immature Granulocytes: 1 %
Lymphocytes Relative: 26 %
Lymphs Abs: 2.2 10*3/uL (ref 0.7–4.0)
MCH: 27.1 pg (ref 26.0–34.0)
MCHC: 30.3 g/dL (ref 30.0–36.0)
MCV: 89.5 fL (ref 80.0–100.0)
Monocytes Absolute: 0.6 10*3/uL (ref 0.1–1.0)
Monocytes Relative: 7 %
Neutro Abs: 5.7 10*3/uL (ref 1.7–7.7)
Neutrophils Relative %: 65 %
Platelets: 435 10*3/uL — ABNORMAL HIGH (ref 150–400)
RBC: 3.51 MIL/uL — ABNORMAL LOW (ref 3.87–5.11)
RDW: 14.8 % (ref 11.5–15.5)
WBC: 8.7 10*3/uL (ref 4.0–10.5)
nRBC: 0 % (ref 0.0–0.2)

## 2022-07-03 LAB — WET PREP, GENITAL
Clue Cells Wet Prep HPF POC: NONE SEEN
Sperm: NONE SEEN
Trich, Wet Prep: NONE SEEN
WBC, Wet Prep HPF POC: <10 (ref <10)

## 2022-07-03 MED ORDER — FLUCONAZOLE 200 MG PO TABS: 200.0000 mg | ORAL_TABLET | Freq: Every day | ORAL | 0 refills | Status: AC

## 2022-07-03 NOTE — ED Provider Notes (Signed)
[facility] EMERGENCY DEPT Provider Note   CSN: [id number] Arrival date & time: 07/03/22  1029     History  Chief Complaint  Patient presents with   Dysuria   Vaginal Discharge    Cindy [name] is a 56 y.o. female.  HPI     Thinks has UTI, rash and spreading Hx of DM Established care at [facility] health clinic, supposed to have referral to [place] treatment 3 days,felt like it helped at first and now rash is spreading   Rash started on week ago, itchy, no similar in past, burning and itching over the rash No fever, no nausea or vomiting  Glucose has bee in the 300s Vaginal discharge for one week Urinary frequency Dysuria   No abdominal pain, flank pain, cp or dyspnea    Past Medical History:  Diagnosis Date   CAD in native artery 07/09/2020   Coronary artery disease    Diabetes mellitus    GERD (gastroesophageal reflux disease)    Hyperlipemia    Hypertension    S/P angioplasty with stent 07/09/2020   Stroke [facility])      Home Medications Prior to Admission medications   Medication Sig Start Date End Date Taking? Authorizing Provider  fluconazole (DIFLUCAN) 200 MG tablet Take 1 tablet (200 mg total) by mouth daily for 10 days. 07/03/22 07/13/22 Yes Gareth Morgan, MD  acetaminophen (TYLENOL) 325 MG tablet Take 2 tablets (650 mg total) by mouth every 4 (four) hours as needed for headache or mild pain. 07/09/20   Isaiah Serge, NP  aspirin 81 MG chewable tablet Chew 1 tablet (81 mg total) by mouth daily. 07/10/20   Isaiah Serge, NP  atorvastatin (LIPITOR) 80 MG tablet Take 1 tablet (80 mg total) by mouth daily. 06/10/22   Nooruddin, Marlene Lard, MD  clopidogrel (PLAVIX) 75 MG tablet Take 1 tablet (75 mg total) by mouth daily. 06/10/22 06/10/23  Nooruddin, Marlene Lard, MD  Continuous Blood Gluc Sensor (FREESTYLE LIBRE 3 SENSOR) MISC Place 1 sensor on the skin every 14 days. Use to check glucose continuously 06/10/22   Nooruddin, Marlene Lard, MD  ezetimibe (ZETIA) 10 MG  tablet Take 1 tablet by mouth once daily 01/12/21   Elouise Munroe, MD  FARXIGA 10 MG TABS tablet Take 1 tablet (10 mg total) by mouth daily. 06/10/22   Nooruddin, Marlene Lard, MD  glucose blood (FREESTYLE LITE) test strip 1 each by Other route as needed (check blood glucose).  09/04/19   [provider]  hydrochlorothiazide (HYDRODIURIL) 25 MG tablet Take 1/2 (one-half) tablet by mouth once daily 02/20/21   Elouise Munroe, MD  insulin glargine (LANTUS) 100 UNIT/ML injection Inject 0.4 mLs (40 Units total) into the skin daily. 06/10/22 07/10/22  Nooruddin, Marlene Lard, MD  Insulin Syringes, Disposable, U-100 0.5 ML MISC 30 Units by Does not apply route 2 (two) times daily with a meal. 08/20/18   Kathi Ludwig, MD  lisinopril (ZESTRIL) 40 MG tablet Take 1 tablet (40 mg total) by mouth daily. 06/10/22   Nooruddin, Marlene Lard, MD  metoprolol tartrate (LOPRESSOR) 25 MG tablet Take 1 tablet (25 mg total) by mouth 2 (two) times daily. 06/10/22 06/10/23  Nooruddin, Marlene Lard, MD  nitroGLYCERIN (NITROSTAT) 0.4 MG SL tablet Place 1 tablet (0.4 mg total) under the tongue every 5 (five) minutes as needed for chest pain. 07/09/20   Isaiah Serge, NP  pantoprazole (PROTONIX) 20 MG tablet Take 1 tablet (20 mg total) by mouth daily. 01/28/22   Elouise Munroe, MD  sertraline (ZOLOFT) 50 MG tablet Take 1 tablet (50 mg total) by mouth at bedtime. 12/27/18   Frann Rider, NP  metFORMIN (GLUCOPHAGE) 1000 MG tablet Take 1,000 mg by mouth 2 (two) times daily with a meal.  10/30/19  [provider]      Allergies    Other    Review of Systems   Review of Systems  Physical Exam Updated Vital Signs BP 139/79 (BP Location: Right Arm)   Pulse 78   Temp 98.7 F (37.1 C) (Oral)   Resp 18   Ht '5\' 3"'$  (1.6 m)   Wt 106.1 kg   SpO2 99%   BMI 41.45 kg/m  Physical Exam Vitals and nursing note reviewed.  Constitutional:      General: She is not in acute distress.    Appearance: Normal appearance. She is not  ill-appearing, toxic-appearing or diaphoretic.  HENT:     Head: Normocephalic.  Eyes:     Conjunctiva/sclera: Conjunctivae normal.  Cardiovascular:     Rate and Rhythm: Normal rate and regular rhythm.     Pulses: Normal pulses.  Pulmonary:     Effort: Pulmonary effort is normal. No respiratory distress.  Genitourinary:    Comments: Significant swelling to labia majora, erythema, vulva with thickened white skin, satellite lesions Musculoskeletal:        General: No deformity or signs of injury.     Cervical back: No rigidity.  Skin:    General: Skin is warm and dry.     Coloration: Skin is not jaundiced or pale.  Neurological:     General: No focal deficit present.     Mental Status: She is alert and oriented to person, place, and time.     ED Results / Procedures / Treatments   Labs (all labs ordered are listed, but only abnormal results are displayed) Labs Reviewed  WET PREP, GENITAL - Abnormal; Notable for the following components:      Result Value   Yeast Wet Prep HPF POC PRESENT (*)    All other components within normal limits  CBC WITH DIFFERENTIAL/PLATELET - Abnormal; Notable for the following components:   RBC 3.51 (*)    Hemoglobin 9.5 (*)    HCT 31.4 (*)    Platelets 435 (*)    All other components within normal limits  COMPREHENSIVE METABOLIC PANEL - Abnormal; Notable for the following components:   Glucose, Bld 304 (*)    Total Protein 8.4 (*)    AST 8 (*)    Total Bilirubin 0.2 (*)    All other components within normal limits  URINALYSIS, ROUTINE W REFLEX MICROSCOPIC - Abnormal; Notable for the following components:   Color, Urine COLORLESS (*)    Glucose, UA >1,000 (*)    Ketones, ur 15 (*)    All other components within normal limits  URINE CULTURE  GC/CHLAMYDIA PROBE AMP ([place]) NOT AT [facility]    EKG None  Radiology No results found.  Procedures Procedures    Medications Ordered in ED Medications - No data to display  ED Course/  Medical Decision Making/ A&P                            56yo female with history of DM, htn, hlpd, CVA, CAD, GERD, presents with concern for vaginal itching, discharge, rash.  Labs completed given hyperglycemia and personally interpreted by me shows no evidence of DKA or other significant abnormalities.  No  leukocytosis. Hgb mildly decreased from previous.    Exam with sign of yeast, lichen sclerosis.  No signs of fournier's gangrene, cellulitis or other bacterial infection.  UA with yeast, wet prep also with yeast.  Will treat with oral fluconazole.  Patient discharged in stable condition with understanding of reasons to return.          Final Clinical Impression(s) / ED Diagnoses Final diagnoses:  Candidal vulvovaginitis    Rx / DC Orders ED Discharge Orders          Ordered    fluconazole (DIFLUCAN) 200 MG tablet  Daily        07/03/22 1224              Gareth Morgan, MD 07/04/22 0020

## 2022-07-03 NOTE — ED Triage Notes (Signed)
Pt via pov from home with dysuria, frequent urination, as well as vaginal discharge, itching, and rash. Pt reports that these symptoms started as urinary only and the vaginal rash and itching and discharge started about a week ago. Pt alert & oriented, nad noted.

## 2022-07-04 LAB — URINE CULTURE: Culture: <10000 — AB

## 2022-07-05 LAB — GC/CHLAMYDIA PROBE AMP (~~LOC~~) NOT AT ARMC
Chlamydia: NEGATIVE
Comment: NEGATIVE
Comment: NORMAL
Neisseria Gonorrhea: NEGATIVE

## 2022-08-18 ENCOUNTER — Other Ambulatory Visit: Payer: Self-pay

## 2022-08-18 DIAGNOSIS — E785 Hyperlipidemia, unspecified: Secondary | ICD-10-CM

## 2022-08-18 MED ORDER — LISINOPRIL 40 MG PO TABS: 40.0000 mg | ORAL_TABLET | Freq: Every day | ORAL | 0 refills | Status: DC

## 2022-08-20 ENCOUNTER — Other Ambulatory Visit: Payer: Self-pay

## 2022-09-09 ENCOUNTER — Other Ambulatory Visit: Payer: Self-pay | Admitting: Internal Medicine

## 2022-09-09 DIAGNOSIS — E785 Hyperlipidemia, unspecified: Secondary | ICD-10-CM

## 2022-09-09 MED ORDER — LISINOPRIL 40 MG PO TABS: 40.0000 mg | ORAL_TABLET | Freq: Every day | ORAL | 0 refills | Status: DC

## 2022-09-09 NOTE — Telephone Encounter (Signed)
Rx(s) sent to pharmacy electronically. Patient overdue for visit - was supposed to be seen May 2023.

## 2022-09-14 ENCOUNTER — Ambulatory Visit: Payer: Commercial Managed Care - HMO | Attending: Internal Medicine | Admitting: Internal Medicine

## 2022-09-14 ENCOUNTER — Encounter: Payer: Self-pay | Admitting: Internal Medicine

## 2022-09-14 VITALS — BP 136/80 | HR 86 | Ht 63.0 in | Wt 232.8 lb

## 2022-09-14 DIAGNOSIS — Z79899 Other long term (current) drug therapy: Secondary | ICD-10-CM

## 2022-09-14 DIAGNOSIS — G4733 Obstructive sleep apnea (adult) (pediatric): Secondary | ICD-10-CM

## 2022-09-14 DIAGNOSIS — I2510 Atherosclerotic heart disease of native coronary artery without angina pectoris: Secondary | ICD-10-CM

## 2022-09-14 DIAGNOSIS — E7800 Pure hypercholesterolemia, unspecified: Secondary | ICD-10-CM

## 2022-09-14 DIAGNOSIS — E0822 Diabetes mellitus due to underlying condition with diabetic chronic kidney disease: Secondary | ICD-10-CM

## 2022-09-14 DIAGNOSIS — E785 Hyperlipidemia, unspecified: Secondary | ICD-10-CM

## 2022-09-14 DIAGNOSIS — R072 Precordial pain: Secondary | ICD-10-CM | POA: Diagnosis not present

## 2022-09-14 DIAGNOSIS — I200 Unstable angina: Secondary | ICD-10-CM

## 2022-09-14 DIAGNOSIS — I10 Essential (primary) hypertension: Secondary | ICD-10-CM

## 2022-09-14 MED ORDER — CLOPIDOGREL BISULFATE 75 MG PO TABS: 75.0000 mg | ORAL_TABLET | Freq: Every day | ORAL | 11 refills | Status: DC

## 2022-09-14 MED ORDER — METOPROLOL SUCCINATE ER 25 MG PO TB24: 25.0000 mg | ORAL_TABLET | Freq: Every day | ORAL | 3 refills | Status: DC

## 2022-09-14 MED ORDER — EZETIMIBE 10 MG PO TABS: 10.0000 mg | ORAL_TABLET | Freq: Every day | ORAL | 3 refills | Status: DC

## 2022-09-14 MED ORDER — NITROGLYCERIN 0.4 MG SL SUBL: 0.4000 mg | SUBLINGUAL_TABLET | SUBLINGUAL | 4 refills | Status: DC | PRN

## 2022-09-14 MED ORDER — HYDROCHLOROTHIAZIDE 25 MG PO TABS: ORAL_TABLET | ORAL | 3 refills | Status: DC

## 2022-09-14 MED ORDER — ATORVASTATIN CALCIUM 80 MG PO TABS: 80.0000 mg | ORAL_TABLET | Freq: Every day | ORAL | 3 refills | Status: DC

## 2022-09-14 MED ORDER — LISINOPRIL 40 MG PO TABS: 40.0000 mg | ORAL_TABLET | Freq: Every day | ORAL | 3 refills | Status: DC

## 2022-09-14 MED ORDER — ISOSORBIDE MONONITRATE ER 30 MG PO TB24: 15.0000 mg | ORAL_TABLET | Freq: Every day | ORAL | 3 refills | Status: DC

## 2022-09-14 NOTE — Progress Notes (Signed)
Cardiology Office Note:    Date:  09/14/2022  ID:  Cindy [name], DOB Sep 21, 1966, MRN [id number]  PCP:  Drucie Opitz, MD  Cardiologist:  Elouise Munroe, MD  Electrophysiologist:  None   Referring MD: Drucie Opitz, MD   Chief Complaint/Reason for Referral: CAD  History of Present Illness:    Cindy [name] is a 56 y.o. female with a history of CAD s/p PCI for UA, type 2 diabetes, HLD, OSA on CPAP, GERD, morbid obesity and essential hypertension.   At her last visit, she had started cardiac rehab, but was not taking insulin due to insurance complications. Her BG has been quite high. Started on protonix for chest pain and her antihypertensive therapy was adjusted to lisinopril 40 mg daily, hctz 12.5 mg daily and metoprolol succinate 25 mg daily.  She followed up with [name], PA on 10/15/21 and described an episode of chest pain that required nitroglycerin x2. She was out of Plavix for a month at the time and restarted her medication then.   Today she is accompanied by her daughter, the patient states that she has been feeling good. 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   She has had two episodes of chest pain in the last year, most recently occurring in the past 10 months. One time was during work right around Christmas 2022 and lasted for a couple of days waxing and waning, it was well managed by taking nitroglycerin once near the end of the few days. She denies headaches after taking nitroglycerin.However, she feels anxious during the chest pains.  Her blood pressure has been elevated for the past week since she was out of her lisinopril, and she was having headaches as a result. She ran out of lisinopril due to having to wait for this appointment.  She states that she has been out of Atorvastatin for the past few months due to insurance complications.She is also not taking Zetia. On 06/10/22 her LDL was  161  Her BLE Edema has greatly improved.  She denies any palpitations, shortness of breath. No lightheadedness, syncope, orthopnea, or PND.  Past Medical History:  Diagnosis Date   CAD in native artery 07/09/2020   Coronary artery disease    Diabetes mellitus    GERD (gastroesophageal reflux disease)    Hyperlipemia    Hypertension    S/P angioplasty with stent 07/09/2020   Stroke [facility])     Past Surgical History:  Procedure Laterality Date   CARDIAC CATHETERIZATION     CORONARY STENT INTERVENTION N/A 07/08/2020   Procedure: CORONARY STENT INTERVENTION;  Surgeon: Jettie Booze, MD;  Location: [place] [facility];  Service: Cardiovascular;  Laterality: N/A;   INTRAVASCULAR ULTRASOUND/IVUS N/A 07/08/2020   Procedure: Intravascular Ultrasound/IVUS;  Surgeon: Jettie Booze, MD;  Location: [place] [facility];  Service: Cardiovascular;  Laterality: N/A;   LEFT HEART CATH AND CORONARY ANGIOGRAPHY N/A 07/07/2020   Procedure: LEFT HEART CATH AND CORONARY ANGIOGRAPHY;  Surgeon: Lorretta Harp, MD;  Location: [place] [facility];  Service: Cardiovascular;  Laterality: N/A;   None      Current Medications: Current Meds  Medication Sig   Continuous Blood Gluc Sensor (FREESTYLE LIBRE 3 SENSOR) MISC Place 1 sensor on the skin every 14 days. Use to check glucose continuously   FARXIGA 10 MG TABS tablet Take 1 tablet (10 mg total) by mouth daily.   glucose blood (FREESTYLE LITE) test strip  1 each by Other route as needed (check blood glucose).    Insulin Syringes, Disposable, U-100 0.5 ML MISC 30 Units by Does not apply route 2 (two) times daily with a meal.   isosorbide mononitrate (IMDUR) 30 MG 24 hr tablet Take 0.5 tablets (15 mg total) by mouth daily.   pantoprazole (PROTONIX) 20 MG tablet Take 1 tablet (20 mg total) by mouth daily.   sertraline (ZOLOFT) 50 MG tablet Take 1 tablet (50 mg total) by mouth at bedtime.   [DISCONTINUED] aspirin 81 MG chewable tablet Chew 1 tablet (81 mg  total) by mouth daily.   [DISCONTINUED] atorvastatin (LIPITOR) 80 MG tablet Take 1 tablet (80 mg total) by mouth daily.   [DISCONTINUED] clopidogrel (PLAVIX) 75 MG tablet Take 1 tablet (75 mg total) by mouth daily.   [DISCONTINUED] hydrochlorothiazide (HYDRODIURIL) 25 MG tablet Take 1/2 (one-half) tablet by mouth once daily   [DISCONTINUED] lisinopril (ZESTRIL) 40 MG tablet Take 1 tablet (40 mg total) by mouth daily. <PLEASE MAKE APPOINTMENT FOR REFILLS>   [DISCONTINUED] metoprolol succinate (TOPROL-XL) 25 MG 24 hr tablet Take 25 mg by mouth daily.     Allergies:   Other   Social History   Tobacco Use   Smoking status: Never   Smokeless tobacco: Never  Vaping Use   Vaping Use: Never used  Substance Use Topics   Alcohol use: No   Drug use: No     Family History: The patient's family history includes Breast cancer in her maternal grandmother and mother; Diabetes in her mother; Heart failure in her father; Hypertension in her father and mother; Stroke in her brother and mother. There is no history of Colon cancer, Stomach cancer, Rectal cancer, Esophageal cancer, or Liver cancer.  ROS:   Please see the history of present illness.    (+)Anxiety (+)Headaches (+)Rare Chest Pain All other systems reviewed and are negative.  EKGs/Labs/Other Studies Reviewed:    The following studies were reviewed today:  EKG: The EKG is personally reviewed 09/14/22: NSR, RBBB 09/11/20: NSR, RBBB, QRS 136 ms  Coronary Stent Intervention: Dist LAD lesion is 80% stenosed. A drug-eluting stent was successfully placed using a STENT RESOLUTE ONYX 2.5X18, postdilated with a 3.0 [place] balloon and optimized wth IVUS. Post intervention, there is a 0% residual stenosis. Mid LAD lesion is 80% stenosed. Minimal calcification by IVUS so atherectomy was not required. A drug-eluting stent was successfully placed using a STENT RESOLUTE ONYX 3.5X22, postdilated with a 4.0 [place] balloon and optimized wth IVUS. Post  intervention, there is a 0% residual stenosis.   Continue aggressive secondary prevention.  I stressed the importance of DAPT.    Diagnostic Dominance: Co-dominant  Intervention    Echo 07/07/20:  1. Left ventricular ejection fraction, by estimation, is 60 to 65%. The  left ventricle has normal function. The left ventricle has no regional  wall motion abnormalities. There is mild concentric left ventricular  hypertrophy. Left ventricular diastolic  parameters were normal.   2. Right ventricular systolic function is normal. The right ventricular  size is normal. Tricuspid regurgitation signal is inadequate for assessing  PA pressure.   3. The mitral valve is grossly normal. Trivial mitral valve  regurgitation. No evidence of mitral stenosis.   4. The aortic valve is tricuspid. Aortic valve regurgitation is not  visualized. No aortic stenosis is present.   5. The inferior vena cava is normal in size with greater than 50%  respiratory variability, suggesting right atrial pressure of 3 mmHg.  Left Heart Cath 07/07/20: Prox RCA lesion is 60% stenosed. Mid RCA to Dist RCA lesion is 80% stenosed. Prox LAD to Mid LAD lesion is 80% stenosed. Dist LAD lesion is 80% stenosed.  Diagnostic  Dominance: Co-dominant  Recent Labs: 07/03/2022: ALT 8; BUN 19; Creatinine, Ser 0.75; Hemoglobin 9.5; Platelets 435; Potassium 3.8; Sodium 138  Recent Lipid Panel    Component Value Date/Time   CHOL 221 (H) 06/10/2022 1214   TRIG 113 06/10/2022 1214   HDL 40 06/10/2022 1214   CHOLHDL 5.5 (H) 06/10/2022 1214   CHOLHDL 6.1 07/07/2020 0118   VLDL 30 07/07/2020 0118   LDLCALC 161 (H) 06/10/2022 1214    Physical Exam:    VS:  BP 136/80 (BP Location: Left Arm, Patient Position: Sitting, Cuff Size: Large)   Pulse 86   Ht '5\' 3"'$  (1.6 m)   Wt 232 lb 12.8 oz (105.6 kg)   SpO2 97%   BMI 41.24 kg/m     Wt Readings from Last 5 Encounters:  09/14/22 232 lb 12.8 oz (105.6 kg)  07/03/22 234 lb (106.1  kg)  06/10/22 235 lb 6.4 oz (106.8 kg)  10/15/21 252 lb 6.4 oz (114.5 kg)  09/11/20 242 lb 9.6 oz (110 kg)    Constitutional: No acute distress Cardiovascular: regular rhythm, normal rate, no murmurs. S1 and S2 normal. Radial pulses normal bilaterally. No jugular venous distention.  Respiratory: clear to auscultation bilaterally GI : normal bowel sounds, soft and nontender. No distention.   MSK: extremities warm, well perfused. No edema.  NEURO: grossly nonfocal exam, moves all extremities. PSYCH: alert and oriented x 3, normal mood and affect.   ASSESSMENT:    1. Precordial chest pain   2. Hyperlipidemia, unspecified hyperlipidemia type   3. Coronary artery disease involving native coronary artery of native heart without angina pectoris   4. Pure hypercholesterolemia   5. Essential hypertension   6. Diabetes mellitus due to underlying condition with chronic kidney disease, without long-term current use of insulin, unspecified CKD stage ([place])   7. OSA on CPAP   8. Unstable angina ([place])   9. Medication management     PLAN:    Essential hypertension - Plan: EKG 12-Lead - continue HCTZ, lisinopril, metoprolol succinate. BP can be slightly better but reasonable control at today's visit.  - refills provided today.   Coronary artery disease involving native coronary artery of native heart without angina pectoris  Chest pain - ok to continue plavix monotherapy and stop asa, confirmed with neurology documentation as well.  - plavix is indefinite therapy.  - continue BB, statin.  - chest pain episodes are nitro responsive, we will start low dose imdur 15 mg daily.   Pure hypercholesterolemia -continue statin and ezetemibe, recheck lipids at next visit. She has been out of these and lipids are quite elevated. LDL 161 in 05/2022.  - refills provided today.   Gastroesophageal reflux disease without esophagitis - on protonix, helped with chest symptoms.   Follow up: 3 Months    Total time of encounter: 40 minutes total time of encounter, including 30 minutes spent in face-to-face patient care on the date of this encounter. This time includes coordination of care and counseling regarding above mentioned problem list. Remainder of non-face-to-face time involved reviewing chart documents/testing relevant to the patient encounter and documentation in the medical record. I have independently reviewed documentation from referring provider.   Cherlynn Kaiser, MD [place]  [facility]   Medication Adjustments/Labs and Tests Ordered: Current  medicines are reviewed at length with the patient today.  Concerns regarding medicines are outlined above.   Orders Placed This Encounter  Procedures   EKG 12-Lead   Meds ordered this encounter  Medications   atorvastatin (LIPITOR) 80 MG tablet    Sig: Take 1 tablet (80 mg total) by mouth daily.    Dispense:  90 tablet    Refill:  3    [facility]   clopidogrel (PLAVIX) 75 MG tablet    Sig: Take 1 tablet (75 mg total) by mouth daily.    Dispense:  30 tablet    Refill:  11    im per dr   ezetimibe (ZETIA) 10 MG tablet    Sig: Take 1 tablet (10 mg total) by mouth daily.    Dispense:  90 tablet    Refill:  3   hydrochlorothiazide (HYDRODIURIL) 25 MG tablet    Sig: Take 0.5 tablet by mouth once daily    Dispense:  45 tablet    Refill:  3   lisinopril (ZESTRIL) 40 MG tablet    Sig: Take 1 tablet (40 mg total) by mouth daily.    Dispense:  90 tablet    Refill:  3   isosorbide mononitrate (IMDUR) 30 MG 24 hr tablet    Sig: Take 0.5 tablets (15 mg total) by mouth daily.    Dispense:  45 tablet    Refill:  3   metoprolol succinate (TOPROL-XL) 25 MG 24 hr tablet    Sig: Take 1 tablet (25 mg total) by mouth daily.    Dispense:  90 tablet    Refill:  3   nitroGLYCERIN (NITROSTAT) 0.4 MG SL tablet    Sig: Place 1 tablet (0.4 mg total) under the tongue every 5 (five) minutes as needed for chest pain.    Dispense:  25  tablet    Refill:  4   Patient Instructions  Medication Instructions:  START Imdur 15 mg daily  STOP Aspirin *If you need a refill on your cardiac medications before your next appointment, please call your pharmacy*   Follow-Up: At [facility], you and your health needs are our priority.  As part of our continuing mission to provide you with exceptional heart care, we have created designated Provider Care Teams.  These Care Teams include your primary Cardiologist (physician) and Advanced Practice Providers (APPs -  Physician Assistants and Nurse Practitioners) who all work together to provide you with the care you need, when you need it.  We recommend signing up for the patient portal called "MyChart".  Sign up information is provided on this After Visit Summary.  MyChart is used to connect with patients for Virtual Visits (Telemedicine).  Patients are able to view lab/test results, encounter notes, upcoming appointments, etc.  Non-urgent messages can be sent to your provider as well.   To learn more about what you can do with MyChart, go to [email or web address].    Your next appointment:   3 month(s)  The format for your next appointment:   In Person  Provider:   Elouise Munroe, MD            I,Coren O'Brien,acting as a scribe for Elouise Munroe, MD.,have documented all relevant documentation on the behalf of Elouise Munroe, MD,as directed by  Elouise Munroe, MD while in the presence of Elouise Munroe, MD.  I, Elouise Munroe, MD, have reviewed all documentation for the visit on 09/14/2022. The documentation  on today's date of service for the exam, diagnosis, procedures, and orders are all accurate and complete.

## 2022-09-14 NOTE — Patient Instructions (Addendum)
Medication Instructions:  START Imdur 15 mg daily  STOP Aspirin *If you need a refill on your cardiac medications before your next appointment, please call your pharmacy*   Follow-Up: At [facility], you and your health needs are our priority.  As part of our continuing mission to provide you with exceptional heart care, we have created designated Provider Care Teams.  These Care Teams include your primary Cardiologist (physician) and Advanced Practice Providers (APPs -  Physician Assistants and Nurse Practitioners) who all work together to provide you with the care you need, when you need it.  We recommend signing up for the patient portal called "MyChart".  Sign up information is provided on this After Visit Summary.  MyChart is used to connect with patients for Virtual Visits (Telemedicine).  Patients are able to view lab/test results, encounter notes, upcoming appointments, etc.  Non-urgent messages can be sent to your provider as well.   To learn more about what you can do with MyChart, go to [email or web address].    Your next appointment:   3 month(s)  The format for your next appointment:   In Person  Provider:   Elouise Munroe, MD

## 2022-10-25 ENCOUNTER — Encounter: Payer: 59 | Admitting: Student

## 2022-10-25 ENCOUNTER — Encounter: Payer: Self-pay | Admitting: Student

## 2022-10-27 LAB — HM DIABETES EYE EXAM

## 2022-12-02 ENCOUNTER — Other Ambulatory Visit: Payer: Self-pay

## 2022-12-02 ENCOUNTER — Telehealth: Payer: Self-pay | Admitting: Internal Medicine

## 2022-12-02 DIAGNOSIS — E118 Type 2 diabetes mellitus with unspecified complications: Secondary | ICD-10-CM

## 2022-12-02 NOTE — Telephone Encounter (Signed)
*  STAT* If patient is at the pharmacy, call can be transferred to refill team.   1. Which medications need to be refilled? (please list name of each medication and dose if known)   pantoprazole (PROTONIX) 20 MG tablet                                                                                                                                                                      2. Which pharmacy/location (including street and city if local pharmacy) is medication to be sent to?  [place] ([place] - [address]    3. Do they need a 30 day or 90 day supply? [place]

## 2022-12-03 ENCOUNTER — Other Ambulatory Visit: Payer: Self-pay | Admitting: Internal Medicine

## 2022-12-03 MED ORDER — FARXIGA 10 MG PO TABS
10.0000 mg | ORAL_TABLET | Freq: Every day | ORAL | 6 refills | Status: DC
Start: 2022-12-03 — End: 2023-03-02

## 2022-12-03 NOTE — Telephone Encounter (Signed)
*  STAT* If patient is at the pharmacy, call can be transferred to refill team.   1. Which medications need to be refilled? (please list name of each medication and dose if known) Pantoprazole  2. Which pharmacy/location (including street and city if local pharmacy) is medication to be sent to? [place], [place]  3. Do they need a 30 day or 90 day supply? 90 days and refills- she needs this call in today please

## 2022-12-04 ENCOUNTER — Other Ambulatory Visit: Payer: Self-pay | Admitting: Internal Medicine

## 2022-12-06 ENCOUNTER — Other Ambulatory Visit: Payer: Self-pay

## 2022-12-06 ENCOUNTER — Other Ambulatory Visit (HOSPITAL_COMMUNITY): Payer: Self-pay

## 2022-12-06 ENCOUNTER — Other Ambulatory Visit: Payer: Self-pay | Admitting: Internal Medicine

## 2022-12-06 IMAGING — DX DG CHEST 2V
2 series · 2 of 2 positions shown · non-contrast
Comparison: 07/06/2020

CLINICAL DATA: Chest pain

EXAM:
CHEST - 2 VIEW

[chest pa]
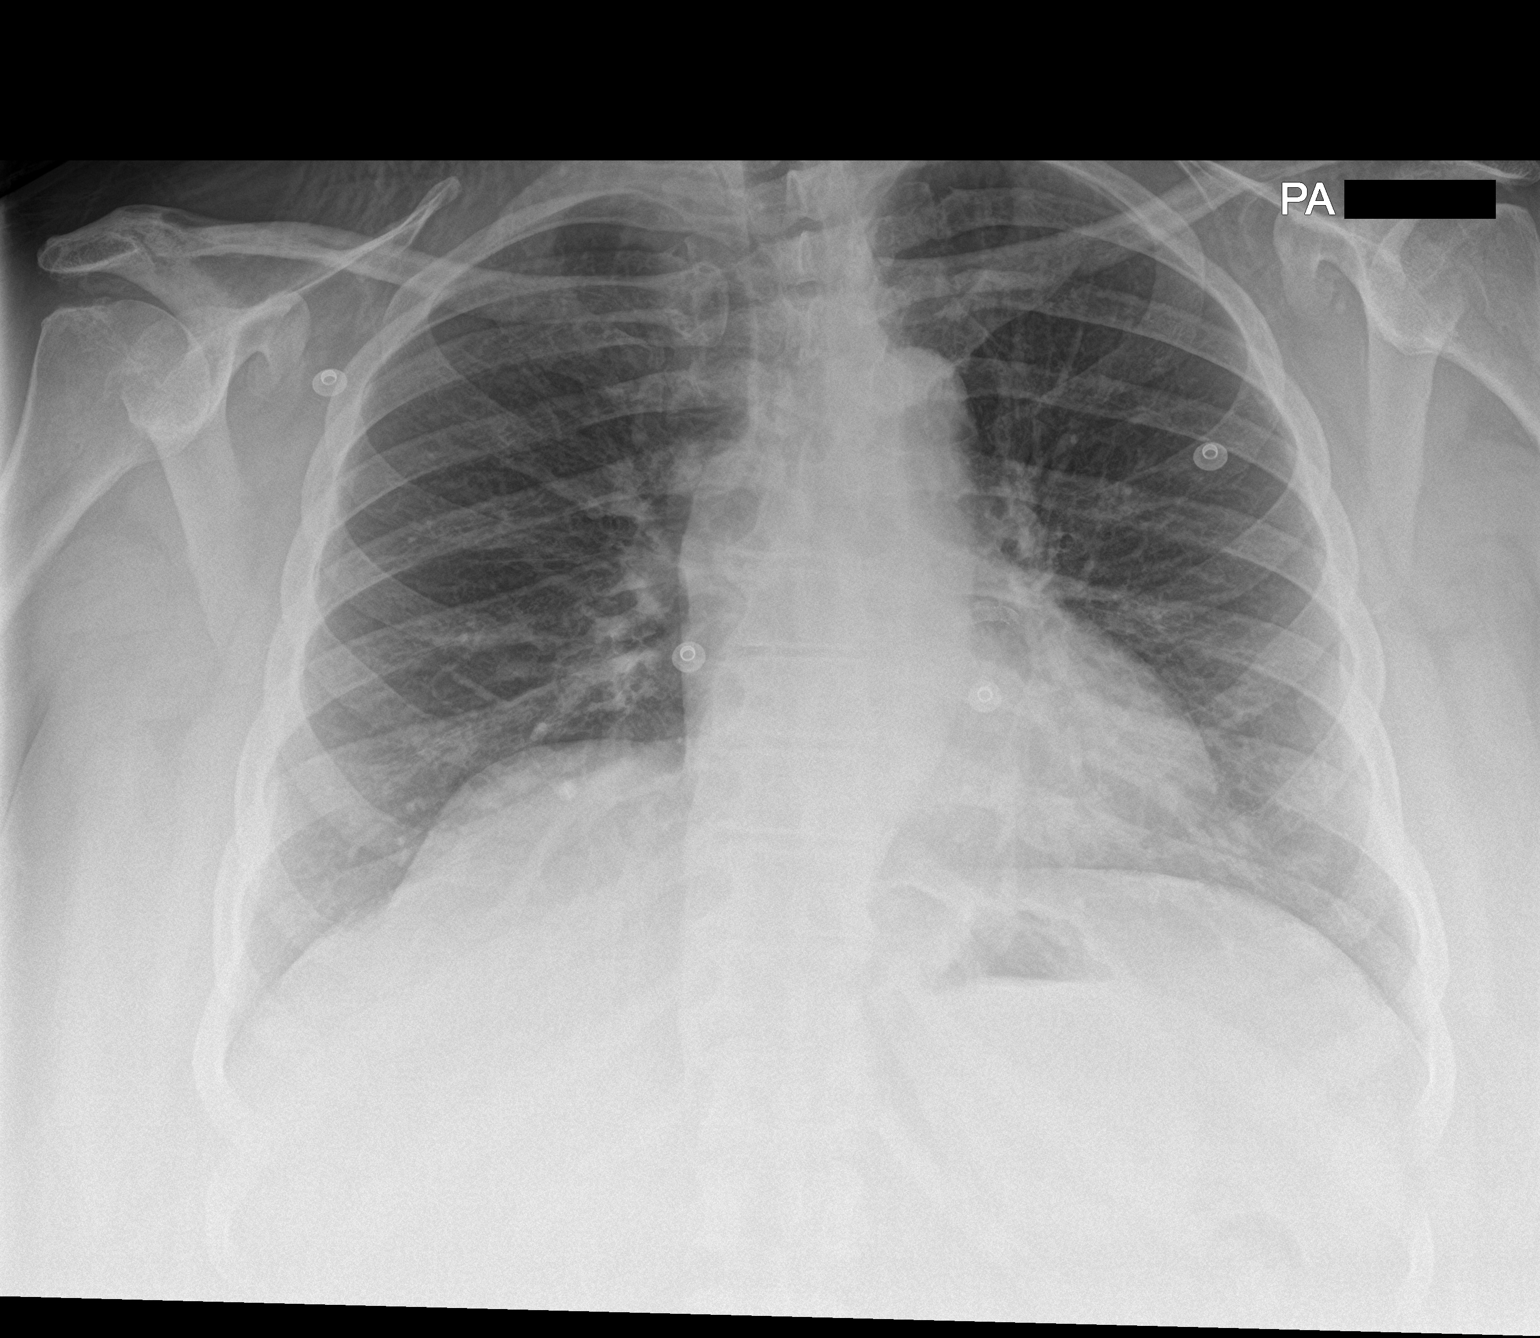

[chest lat]
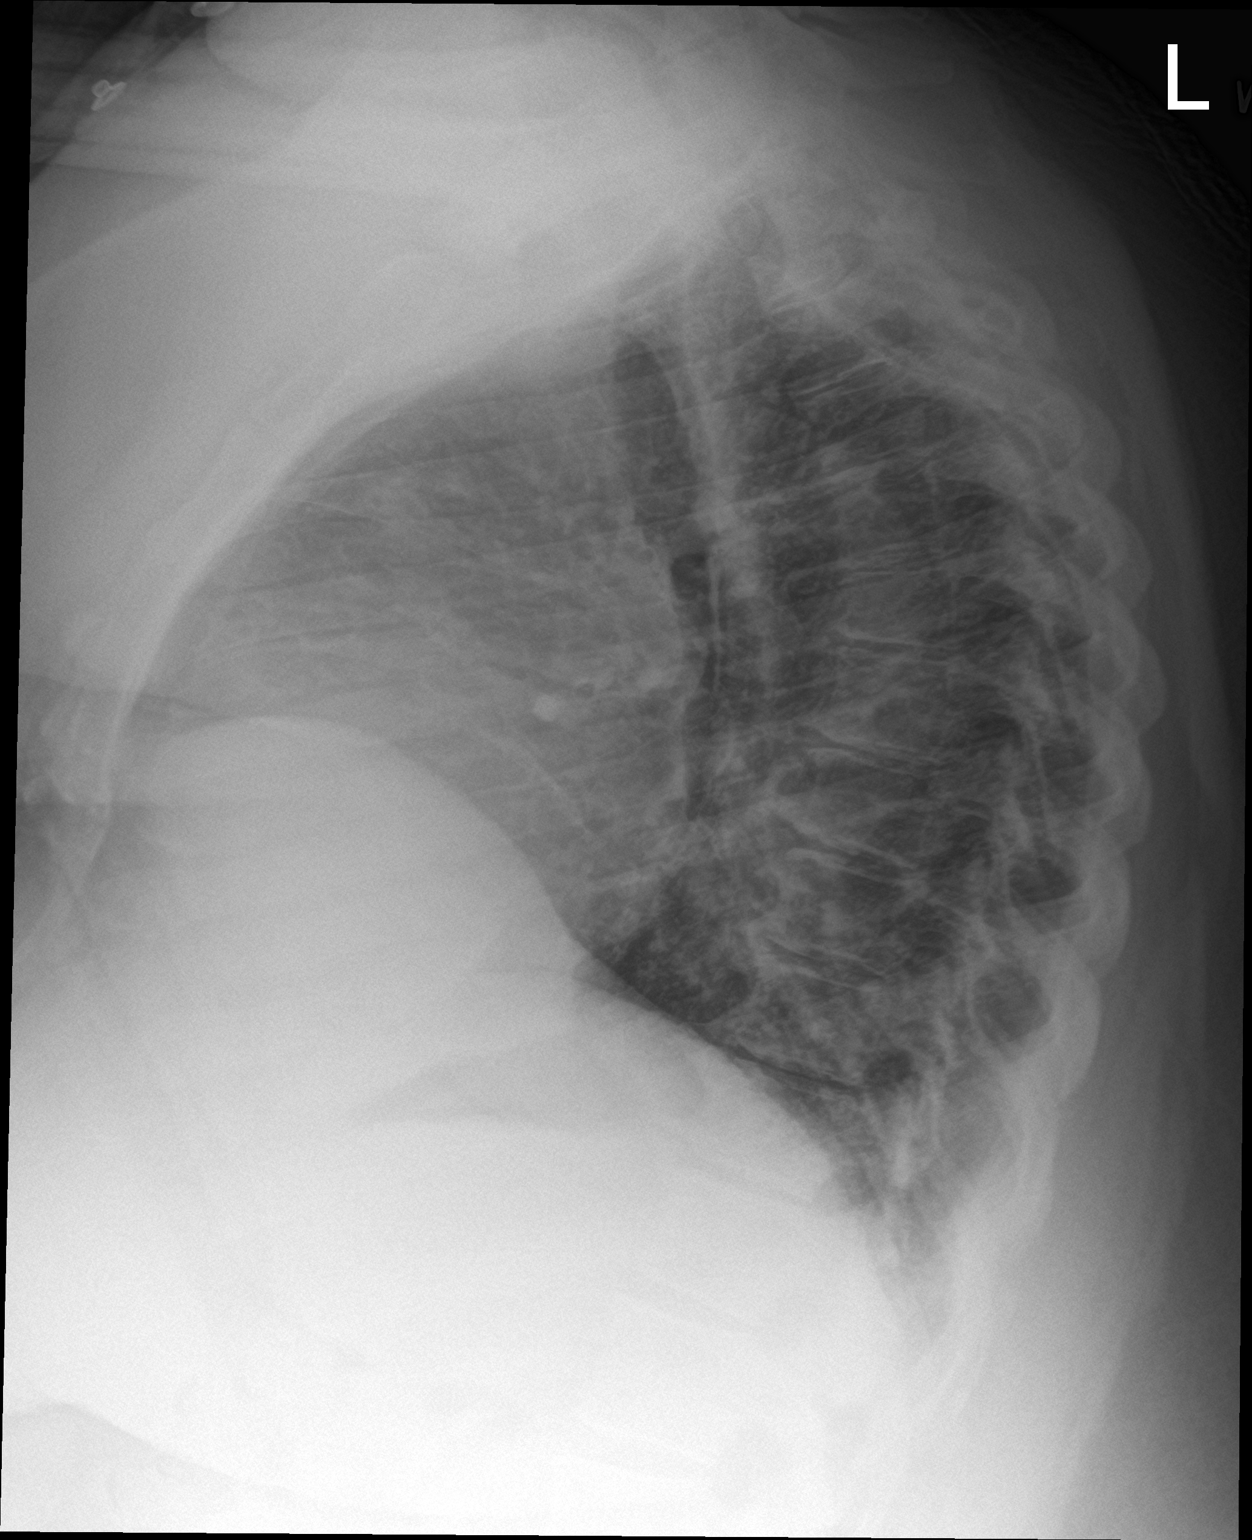

[2 of 2 positions shown; findings below may reference images not displayed]

FINDINGS: The heart size and mediastinal contours are within normal limits.
Both lungs are clear. The visualized skeletal structures are
unremarkable.
IMPRESSION: No active cardiopulmonary disease.

## 2022-12-06 MED ORDER — PANTOPRAZOLE SODIUM 20 MG PO TBEC: 20.0000 mg | DELAYED_RELEASE_TABLET | Freq: Every day | ORAL | 0 refills | Status: DC

## 2022-12-11 ENCOUNTER — Other Ambulatory Visit: Payer: Self-pay

## 2022-12-27 ENCOUNTER — Ambulatory Visit: Payer: 59 | Admitting: Internal Medicine

## 2022-12-27 NOTE — Progress Notes (Deleted)
Cardiology Office Note:    Date:  12/27/2022  ID:  [name], DOB 09-16-1966, MRN [id number]  PCP:  Drucie Opitz, MD  Cardiologist:  Elouise Munroe, MD  Electrophysiologist:  None   Referring MD: Drucie Opitz, MD   Chief Complaint/Reason for Referral: CAD  History of Present Illness:    Cindy [name] is a 57 y.o. female with a history of CAD s/p PCI for UA, type 2 diabetes, HLD, OSA on CPAP, GERD, morbid obesity and essential hypertension.   Current visit: ***    Prior visit: At her last visit, she had started cardiac rehab, but was not taking insulin due to insurance complications. Her BG has been quite high. Started on protonix for chest pain and her antihypertensive therapy was adjusted to lisinopril 40 mg daily, hctz 12.5 mg daily and metoprolol succinate 25 mg daily.  She followed up with [name], PA on 10/15/21 and described an episode of chest pain that required nitroglycerin x2. She was out of Plavix for a month at the time and restarted her medication then.   Today she is accompanied by her daughter, the patient states that she has been feeling good. 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   She has had two episodes of chest pain in the last year, most recently occurring in the past 10 months. One time was during work right around Christmas 2022 and lasted for a couple of days waxing and waning, it was well managed by taking nitroglycerin once near the end of the few days. She denies headaches after taking nitroglycerin.However, she feels anxious during the chest pains.  Her blood pressure has been elevated for the past week since she was out of her lisinopril, and she was having headaches as a result. She ran out of lisinopril due to having to wait for this appointment.  She states that she has been out of Atorvastatin for the past few months due to insurance complications.She is also not  taking Zetia. On 06/10/22 her LDL was 161  Her BLE Edema has greatly improved.  She denies any palpitations, shortness of breath. No lightheadedness, syncope, orthopnea, or PND.  Past Medical History:  Diagnosis Date   CAD in native artery 07/09/2020   Coronary artery disease    Diabetes mellitus    GERD (gastroesophageal reflux disease)    Hyperlipemia    Hypertension    S/P angioplasty with stent 07/09/2020   Stroke [facility])     Past Surgical History:  Procedure Laterality Date   CARDIAC CATHETERIZATION     CORONARY STENT INTERVENTION N/A 07/08/2020   Procedure: CORONARY STENT INTERVENTION;  Surgeon: Jettie Booze, MD;  Location: [place] [facility];  Service: Cardiovascular;  Laterality: N/A;   INTRAVASCULAR ULTRASOUND/IVUS N/A 07/08/2020   Procedure: Intravascular Ultrasound/IVUS;  Surgeon: Jettie Booze, MD;  Location: [place] [facility];  Service: Cardiovascular;  Laterality: N/A;   LEFT HEART CATH AND CORONARY ANGIOGRAPHY N/A 07/07/2020   Procedure: LEFT HEART CATH AND CORONARY ANGIOGRAPHY;  Surgeon: Lorretta Harp, MD;  Location: [place] [facility];  Service: Cardiovascular;  Laterality: N/A;   None      Current Medications: No outpatient medications have been marked as taking for the 12/27/22 encounter (Appointment) with Elouise Munroe, MD.     Allergies:   Other   Social History   Tobacco Use   Smoking status: Never   Smokeless tobacco: Never  Vaping  Use   Vaping Use: Never used  Substance Use Topics   Alcohol use: No   Drug use: No     Family History: The patient's family history includes Breast cancer in her maternal grandmother and mother; Diabetes in her mother; Heart failure in her father; Hypertension in her father and mother; Stroke in her brother and mother. There is no history of Colon cancer, Stomach cancer, Rectal cancer, Esophageal cancer, or Liver cancer.  ROS:   Please see the history of present illness.    All other systems  reviewed and are negative.  EKGs/Labs/Other Studies Reviewed:    The following studies were reviewed today:  EKG: The EKG is personally reviewed 09/14/22: NSR, RBBB 09/11/20: NSR, RBBB, QRS 136 ms  Coronary Stent Intervention: Dist LAD lesion is 80% stenosed. A drug-eluting stent was successfully placed using a STENT RESOLUTE ONYX 2.5X18, postdilated with a 3.0 [place] balloon and optimized wth IVUS. Post intervention, there is a 0% residual stenosis. Mid LAD lesion is 80% stenosed. Minimal calcification by IVUS so atherectomy was not required. A drug-eluting stent was successfully placed using a STENT RESOLUTE ONYX 3.5X22, postdilated with a 4.0 [place] balloon and optimized wth IVUS. Post intervention, there is a 0% residual stenosis.   Continue aggressive secondary prevention.  I stressed the importance of DAPT.    Diagnostic Dominance: Co-dominant  Intervention    Echo 07/07/20:  1. Left ventricular ejection fraction, by estimation, is 60 to 65%. The  left ventricle has normal function. The left ventricle has no regional  wall motion abnormalities. There is mild concentric left ventricular  hypertrophy. Left ventricular diastolic  parameters were normal.   2. Right ventricular systolic function is normal. The right ventricular  size is normal. Tricuspid regurgitation signal is inadequate for assessing  PA pressure.   3. The mitral valve is grossly normal. Trivial mitral valve  regurgitation. No evidence of mitral stenosis.   4. The aortic valve is tricuspid. Aortic valve regurgitation is not  visualized. No aortic stenosis is present.   5. The inferior vena cava is normal in size with greater than 50%  respiratory variability, suggesting right atrial pressure of 3 mmHg.   Left Heart Cath 07/07/20: Prox RCA lesion is 60% stenosed. Mid RCA to Dist RCA lesion is 80% stenosed. Prox LAD to Mid LAD lesion is 80% stenosed. Dist LAD lesion is 80% stenosed.  Diagnostic  Dominance:  Co-dominant  Recent Labs: 07/03/2022: ALT 8; BUN 19; Creatinine, Ser 0.75; Hemoglobin 9.5; Platelets 435; Potassium 3.8; Sodium 138  Recent Lipid Panel    Component Value Date/Time   CHOL 221 (H) 06/10/2022 1214   TRIG 113 06/10/2022 1214   HDL 40 06/10/2022 1214   CHOLHDL 5.5 (H) 06/10/2022 1214   CHOLHDL 6.1 07/07/2020 0118   VLDL 30 07/07/2020 0118   LDLCALC 161 (H) 06/10/2022 1214    Physical Exam:    VS:  There were no vitals taken for this visit.    Wt Readings from Last 5 Encounters:  09/14/22 232 lb 12.8 oz (105.6 kg)  07/03/22 234 lb (106.1 kg)  06/10/22 235 lb 6.4 oz (106.8 kg)  10/15/21 252 lb 6.4 oz (114.5 kg)  09/11/20 242 lb 9.6 oz (110 kg)    Constitutional: No acute distress Cardiovascular: regular rhythm, normal rate, no murmurs. S1 and S2 normal. No jugular venous distention.  Respiratory: clear to auscultation bilaterally GI : normal bowel sounds, soft and nontender. No distention.   MSK: extremities warm, well perfused. No  edema.  NEURO: grossly nonfocal exam, moves all extremities. PSYCH: alert and oriented x 3, normal mood and affect.   ASSESSMENT:    1. Essential hypertension   2. Coronary artery disease involving native coronary artery of native heart without angina pectoris   3. Chest pain, unspecified type   4. Pure hypercholesterolemia   5. Medication management   6. Diabetes mellitus due to underlying condition with chronic kidney disease, without long-term current use of insulin, unspecified CKD stage ([place])   7. OSA on CPAP   8. Gastroesophageal reflux disease, unspecified whether esophagitis present      PLAN:    Essential hypertension - Plan: EKG 12-Lead - continue HCTZ, lisinopril, metoprolol succinate. BP can be slightly better but reasonable control at today's visit.  - refills provided today.   Coronary artery disease involving native coronary artery of native heart without angina pectoris  Chest pain - ok to continue plavix  monotherapy and stop asa, confirmed with neurology documentation as well.  - plavix is indefinite therapy.  - continue BB, statin.  - chest pain episodes are nitro responsive, we will start low dose imdur 15 mg daily.   Pure hypercholesterolemia -continue statin and ezetemibe, recheck lipids at next visit. She has been out of these and lipids are quite elevated. LDL 161 in 05/2022.  - refills provided today.   Gastroesophageal reflux disease without esophagitis - on protonix, helped with chest symptoms.    Total time of encounter: 30 minutes total time of encounter, including 20 minutes spent in face-to-face patient care on the date of this encounter. This time includes coordination of care and counseling regarding above mentioned problem list. Remainder of non-face-to-face time involved reviewing chart documents/testing relevant to the patient encounter and documentation in the medical record. I have independently reviewed documentation from referring provider.   Cherlynn Kaiser, MD, [facility]   Medication Adjustments/Labs and Tests Ordered: Current medicines are reviewed at length with the patient today.  Concerns regarding medicines are outlined above.   No orders of the defined types were placed in this encounter.  No orders of the defined types were placed in this encounter.  There are no Patient Instructions on file for this visit.

## 2022-12-28 ENCOUNTER — Encounter: Payer: Self-pay | Admitting: Nurse Practitioner

## 2022-12-28 ENCOUNTER — Ambulatory Visit: Payer: 59 | Attending: Internal Medicine | Admitting: Nurse Practitioner

## 2022-12-28 VITALS — BP 108/72 | HR 77 | Ht 63.0 in | Wt 234.0 lb

## 2022-12-28 DIAGNOSIS — I10 Essential (primary) hypertension: Secondary | ICD-10-CM

## 2022-12-28 DIAGNOSIS — I2510 Atherosclerotic heart disease of native coronary artery without angina pectoris: Secondary | ICD-10-CM

## 2022-12-28 DIAGNOSIS — E0822 Diabetes mellitus due to underlying condition with diabetic chronic kidney disease: Secondary | ICD-10-CM

## 2022-12-28 DIAGNOSIS — G4733 Obstructive sleep apnea (adult) (pediatric): Secondary | ICD-10-CM

## 2022-12-28 DIAGNOSIS — E7800 Pure hypercholesterolemia, unspecified: Secondary | ICD-10-CM

## 2022-12-28 DIAGNOSIS — E785 Hyperlipidemia, unspecified: Secondary | ICD-10-CM

## 2022-12-28 MED ORDER — HYDROCHLOROTHIAZIDE 25 MG PO TABS: ORAL_TABLET | ORAL | 3 refills | Status: DC

## 2022-12-28 MED ORDER — EZETIMIBE 10 MG PO TABS: 10.0000 mg | ORAL_TABLET | Freq: Every day | ORAL | 3 refills | Status: DC

## 2022-12-28 MED ORDER — NITROGLYCERIN 0.4 MG SL SUBL: 0.4000 mg | SUBLINGUAL_TABLET | SUBLINGUAL | 4 refills | Status: DC | PRN

## 2022-12-28 MED ORDER — ISOSORBIDE MONONITRATE ER 30 MG PO TB24: 15.0000 mg | ORAL_TABLET | Freq: Every day | ORAL | 3 refills | Status: DC

## 2022-12-28 MED ORDER — LISINOPRIL 40 MG PO TABS: 40.0000 mg | ORAL_TABLET | Freq: Every day | ORAL | 3 refills | Status: DC

## 2022-12-28 MED ORDER — CLOPIDOGREL BISULFATE 75 MG PO TABS: 75.0000 mg | ORAL_TABLET | Freq: Every day | ORAL | 11 refills | Status: DC

## 2022-12-28 MED ORDER — METOPROLOL SUCCINATE ER 25 MG PO TB24: 25.0000 mg | ORAL_TABLET | Freq: Every day | ORAL | 3 refills | Status: DC

## 2022-12-28 NOTE — Progress Notes (Signed)
Office Visit    Patient Name: Cindy [name] Date of Encounter: 12/28/2022  Primary Care Provider:  Drucie Opitz, MD Primary Cardiologist:  Elouise Munroe, MD  Chief Complaint   57 year old female with a history of CAD s/p DES x 2-LAD in 2021, hypertension, hyperlipidemia, type 2 diabetes, OSA, and GERD who presents for follow-up related to CAD.  Past Medical History    Past Medical History:  Diagnosis Date   CAD in native artery 07/09/2020   Coronary artery disease    Diabetes mellitus    GERD (gastroesophageal reflux disease)    Hyperlipemia    Hypertension    S/P angioplasty with stent 07/09/2020   Stroke [facility])    Past Surgical History:  Procedure Laterality Date   CARDIAC CATHETERIZATION     CORONARY STENT INTERVENTION N/A 2020-08-04   Procedure: CORONARY STENT INTERVENTION;  Surgeon: Jettie Booze, MD;  Location: [place] [facility];  Service: Cardiovascular;  Laterality: N/A;   INTRAVASCULAR ULTRASOUND/IVUS N/A 2020-08-04   Procedure: Intravascular Ultrasound/IVUS;  Surgeon: Jettie Booze, MD;  Location: [place] [facility];  Service: Cardiovascular;  Laterality: N/A;   LEFT HEART CATH AND CORONARY ANGIOGRAPHY N/A 07/07/2020   Procedure: LEFT HEART CATH AND CORONARY ANGIOGRAPHY;  Surgeon: Lorretta Harp, MD;  Location: [place] [facility];  Service: Cardiovascular;  Laterality: N/A;   None      Allergies  Allergies  Allergen Reactions   Other     Hives to an unknown medication given in 2012 at [facility]      Labs/Other Studies Reviewed    The following studies were reviewed today: LHC 08-04-20: Dist LAD lesion is 80% stenosed. A drug-eluting stent was successfully placed using a STENT RESOLUTE ONYX 2.5X18, postdilated with a 3.0 [place] balloon and optimized wth IVUS. Post intervention, there is a 0% residual stenosis. Mid LAD lesion is 80% stenosed. Minimal calcification by IVUS so atherectomy was not required. A drug-eluting stent was  successfully placed using a STENT RESOLUTE ONYX 3.5X22, postdilated with a 4.0 [place] balloon and optimized wth IVUS. Post intervention, there is a 0% residual stenosis.   Continue aggressive secondary prevention.  I stressed the importance of DAPT.     Echo 06/2020: IMPRESSIONS    1. Left ventricular ejection fraction, by estimation, is 60 to 65%. The  left ventricle has normal function. The left ventricle has no regional  wall motion abnormalities. There is mild concentric left ventricular  hypertrophy. Left ventricular diastolic  parameters were normal.   2. Right ventricular systolic function is normal. The right ventricular  size is normal. Tricuspid regurgitation signal is inadequate for assessing  PA pressure.   3. The mitral valve is grossly normal. Trivial mitral valve  regurgitation. No evidence of mitral stenosis.   4. The aortic valve is tricuspid. Aortic valve regurgitation is not  visualized. No aortic stenosis is present.   5. The inferior vena cava is normal in size with greater than 50%  respiratory variability, suggesting right atrial pressure of 3 mmHg.    Recent Labs: 07/03/2022: ALT 8; BUN 19; Creatinine, Ser 0.75; Hemoglobin 9.5; Platelets 435; Potassium 3.8; Sodium 138  Recent Lipid Panel    Component Value Date/Time   CHOL 221 (H) 06/10/2022 1214   TRIG 113 06/10/2022 1214   HDL 40 06/10/2022 1214   CHOLHDL 5.5 (H) 06/10/2022 1214   CHOLHDL 6.1 07/07/2020 0118   VLDL 30 07/07/2020 0118   LDLCALC 161 (H) 06/10/2022 1214  History of Present Illness    57 year old female with the above past medical history including CAD s/p DES x 2-LAD in 2021, hypertension, hyperlipidemia, type 2 diabetes, OSA, and GERD.  Echocardiogram in 06/2020 showed EF 60 to 65%, normal LV function, no RWMA, mild concentric LVH, normal RV systolic function, no significant valvular abnormalities.  She was last in the office on 09/14/2022 and noted rare intermittent chest discomfort.  She  was started on low-dose Imdur.  DAPT was transitioned to Plavix monotherapy.    She presents today for follow-up accompanied by her daughter.  Since her last visit she has done well from a cardiac standpoint.  Starting Imdur, she denies any recurrent chest pain.  She does note some mild dependent nonpitting bilateral lower extremity edema, she denies dyspnea, PND, orthopnea, weight gain.  Overall, she reports feeling well.  Home Medications    Current Outpatient Medications  Medication Sig Dispense Refill   atorvastatin (LIPITOR) 80 MG tablet Take 1 tablet (80 mg total) by mouth daily. 90 tablet 3   Continuous Blood Gluc Sensor (FREESTYLE LIBRE 3 SENSOR) MISC Place 1 sensor on the skin every 14 days. Use to check glucose continuously 2 each 2   FARXIGA 10 MG TABS tablet Take 1 tablet (10 mg total) by mouth daily. 30 tablet 6   glucose blood (FREESTYLE LITE) test strip 1 each by Other route as needed (check blood glucose).      insulin glargine (LANTUS) 100 UNIT/ML injection Inject 0.4 mLs (40 Units total) into the skin daily. 12 mL 0   Insulin Syringes, Disposable, U-100 0.5 ML MISC 30 Units by Does not apply route 2 (two) times daily with a meal. 100 each 3   pantoprazole (PROTONIX) 20 MG tablet Take 1 tablet (20 mg total) by mouth daily. 90 tablet 0   sertraline (ZOLOFT) 50 MG tablet Take 1 tablet (50 mg total) by mouth at bedtime. 90 tablet 0   acetaminophen (TYLENOL) 325 MG tablet Take 2 tablets (650 mg total) by mouth every 4 (four) hours as needed for headache or mild pain. (Patient not taking: Reported on 09/14/2022)     clopidogrel (PLAVIX) 75 MG tablet Take 1 tablet (75 mg total) by mouth daily. 30 tablet 11   ezetimibe (ZETIA) 10 MG tablet Take 1 tablet (10 mg total) by mouth daily. 90 tablet 3   hydrochlorothiazide (HYDRODIURIL) 25 MG tablet Take 0.5 tablet by mouth once daily 45 tablet 3   isosorbide mononitrate (IMDUR) 30 MG 24 hr tablet Take 0.5 tablets (15 mg total) by mouth daily.  45 tablet 3   lisinopril (ZESTRIL) 40 MG tablet Take 1 tablet (40 mg total) by mouth daily. 90 tablet 3   metoprolol succinate (TOPROL-XL) 25 MG 24 hr tablet Take 1 tablet (25 mg total) by mouth daily. 90 tablet 3   nitroGLYCERIN (NITROSTAT) 0.4 MG SL tablet Place 1 tablet (0.4 mg total) under the tongue every 5 (five) minutes as needed for chest pain. 25 tablet 4   No current facility-administered medications for this visit.     Review of Systems    She denies chest pain, palpitations, dyspnea, pnd, orthopnea, n, v, dizziness, syncope, weight gain, or early satiety. All other systems reviewed and are otherwise negative except as noted above.   Physical Exam    VS:  BP 108/72   Pulse 77   Ht '5\' 3"'$  (1.6 m)   Wt 234 lb (106.1 kg)   SpO2 98%   BMI 41.45 kg/m  GEN: Well nourished, well developed, in no acute distress. HEENT: normal. Neck: Supple, no JVD, carotid bruits, or masses. Cardiac: RRR, no murmurs, rubs, or gallops. No clubbing, cyanosis, edema.  Radials/DP/PT 2+ and equal bilaterally.  Respiratory:  Respirations regular and unlabored, clear to auscultation bilaterally. GI: Obese, soft, nontender, nondistended, BS + x 4. MS: no deformity or atrophy. Skin: warm and dry, no rash. Neuro:  Strength and sensation are intact. Psych: Normal affect.  Accessory Clinical Findings    ECG personally reviewed by me today - No EKG in office today.    Lab Results  Component Value Date   WBC 8.7 07/03/2022   HGB 9.5 (L) 07/03/2022   HCT 31.4 (L) 07/03/2022   MCV 89.5 07/03/2022   PLT 435 (H) 07/03/2022   Lab Results  Component Value Date   CREATININE 0.75 07/03/2022   BUN 19 07/03/2022   NA 138 07/03/2022   K 3.8 07/03/2022   CL 99 07/03/2022   CO2 25 07/03/2022   Lab Results  Component Value Date   ALT 8 07/03/2022   AST 8 (L) 07/03/2022   ALKPHOS 83 07/03/2022   BILITOT 0.2 (L) 07/03/2022   Lab Results  Component Value Date   CHOL 221 (H) 06/10/2022   HDL 40  06/10/2022   LDLCALC 161 (H) 06/10/2022   TRIG 113 06/10/2022   CHOLHDL 5.5 (H) 06/10/2022    Lab Results  Component Value Date   HGBA1C >14.0 06/10/2022    Assessment & Plan    1. CAD: S/p DES x 2-LAD in 2021.  Stable with no anginal symptoms.  Continue Plavix, hydrochlorothiazide, lisinopril, metoprolol, Imdur, Lipitor, and Zetia.  2. Hypertension: BP well controlled. Continue current antihypertensive regimen.   3. Hyperlipidemia: LDL was 11/2021.  Will repeat fasting lipid panel, LFTs today.  If LDL remains elevated above goal, consider referral to lipid clinic Pharm.D.  For now, continue Lipitor, Zetia.   4. Type 2 diabetes: A1c was > 14.0 in 05/2022.  She does have some peripheral neuropathy.  She is following with endocrinology.  5. OSA: Adherent to CPAP.  Denies any concerns.  6. Disposition: Follow-up in 6 months with Dr. Margaretann Loveless.       Lenna Sciara, NP 12/28/2022, 11:27 AM

## 2022-12-28 NOTE — Patient Instructions (Signed)
Medication Instructions:  Your physician recommends that you continue on your current medications as directed. Please refer to the Current Medication list given to you today.  *If you need a refill on your cardiac medications before your next appointment, please call your pharmacy*   Lab Work: Your physician recommends that you have labs done today Fasting Lipid panel and LFT. If you have labs (blood work) drawn today and your tests are completely normal, you will receive your results only by: Louise (if you have MyChart) OR A paper copy in the mail If you have any lab test that is abnormal or we need to change your treatment, we will call you to review the results.   Testing/Procedures: None ordered   Follow-Up: At [facility], you and your health needs are our priority.  As part of our continuing mission to provide you with exceptional heart care, we have created designated Provider Care Teams.  These Care Teams include your primary Cardiologist (physician) and Advanced Practice Providers (APPs -  Physician Assistants and Nurse Practitioners) who all work together to provide you with the care you need, when you need it.  We recommend signing up for the patient portal called "MyChart".  Sign up information is provided on this After Visit Summary.  MyChart is used to connect with patients for Virtual Visits (Telemedicine).  Patients are able to view lab/test results, encounter notes, upcoming appointments, etc.  Non-urgent messages can be sent to your provider as well.   To learn more about what you can do with MyChart, go to [email or web address].    Your next appointment:   6 month(s)  Provider:   Elouise Munroe, MD   Other Instructions

## 2022-12-29 LAB — LIPID PANEL
Chol/HDL Ratio: 3 ratio (ref 0.0–4.4)
Cholesterol, Total: 129 mg/dL (ref 100–199)
HDL: 43 mg/dL (ref >39)
LDL Chol Calc (NIH): 65 mg/dL (ref 0–99)
Triglycerides: 113 mg/dL (ref 0–149)
VLDL Cholesterol Cal: 21 mg/dL (ref 5–40)

## 2022-12-29 LAB — HEPATIC FUNCTION PANEL
ALT: 13 IU/L (ref 0–32)
AST: 10 IU/L (ref 0–40)
Albumin: 3.7 g/dL — ABNORMAL LOW (ref 3.8–4.9)
Alkaline Phosphatase: 123 IU/L — ABNORMAL HIGH (ref 44–121)
Bilirubin Total: <0.2 mg/dL (ref 0.0–1.2)
Bilirubin, Direct: <0.1 mg/dL (ref 0.00–0.40)
Total Protein: 7.2 g/dL (ref 6.0–8.5)

## 2022-12-30 ENCOUNTER — Telehealth: Payer: Self-pay

## 2022-12-30 ENCOUNTER — Encounter: Payer: Self-pay | Admitting: Dietician

## 2022-12-30 NOTE — Telephone Encounter (Signed)
Spoke with pt. Pt was notified of lab results and recommendations. Pt will continue her current medication and f/u as planned.

## 2023-02-10 ENCOUNTER — Telehealth: Payer: Self-pay

## 2023-02-10 NOTE — Telephone Encounter (Signed)
error 

## 2023-02-10 NOTE — Telephone Encounter (Signed)
Paper Work Dropped Off: Medical Clearance for Dentist  Date: 02-10-23  Location of paper: MailBox in Back

## 2023-02-17 ENCOUNTER — Other Ambulatory Visit (HOSPITAL_COMMUNITY): Payer: 59

## 2023-02-17 ENCOUNTER — Other Ambulatory Visit (HOSPITAL_COMMUNITY): Payer: Self-pay

## 2023-02-17 ENCOUNTER — Encounter (HOSPITAL_BASED_OUTPATIENT_CLINIC_OR_DEPARTMENT_OTHER): Payer: Self-pay | Admitting: *Deleted

## 2023-02-17 ENCOUNTER — Encounter (HOSPITAL_COMMUNITY): Payer: Self-pay

## 2023-02-17 ENCOUNTER — Observation Stay (HOSPITAL_BASED_OUTPATIENT_CLINIC_OR_DEPARTMENT_OTHER): Payer: Commercial Managed Care - HMO

## 2023-02-17 ENCOUNTER — Emergency Department (HOSPITAL_BASED_OUTPATIENT_CLINIC_OR_DEPARTMENT_OTHER): Payer: Commercial Managed Care - HMO

## 2023-02-17 ENCOUNTER — Observation Stay (HOSPITAL_BASED_OUTPATIENT_CLINIC_OR_DEPARTMENT_OTHER)
Admission: EM | Admit: 2023-02-17 | Discharge: 2023-02-17 | Disposition: A | Discharge status: Home / Self Care | Payer: Commercial Managed Care - HMO | Attending: Internal Medicine | Admitting: Internal Medicine

## 2023-02-17 ENCOUNTER — Other Ambulatory Visit: Payer: Self-pay | Admitting: Cardiology

## 2023-02-17 ENCOUNTER — Other Ambulatory Visit: Payer: Self-pay

## 2023-02-17 DIAGNOSIS — R0789 Other chest pain: Principal | ICD-10-CM | POA: Insufficient documentation

## 2023-02-17 DIAGNOSIS — R252 Cramp and spasm: Secondary | ICD-10-CM | POA: Diagnosis not present

## 2023-02-17 DIAGNOSIS — Z6841 Body Mass Index (BMI) 40.0 and over, adult: Secondary | ICD-10-CM

## 2023-02-17 DIAGNOSIS — Z7902 Long term (current) use of antithrombotics/antiplatelets: Secondary | ICD-10-CM | POA: Insufficient documentation

## 2023-02-17 DIAGNOSIS — Z8673 Personal history of transient ischemic attack (TIA), and cerebral infarction without residual deficits: Secondary | ICD-10-CM | POA: Diagnosis not present

## 2023-02-17 DIAGNOSIS — I2089 Other forms of angina pectoris: Secondary | ICD-10-CM | POA: Diagnosis not present

## 2023-02-17 DIAGNOSIS — M7989 Other specified soft tissue disorders: Secondary | ICD-10-CM | POA: Diagnosis not present

## 2023-02-17 DIAGNOSIS — I2510 Atherosclerotic heart disease of native coronary artery without angina pectoris: Secondary | ICD-10-CM | POA: Diagnosis present

## 2023-02-17 DIAGNOSIS — E118 Type 2 diabetes mellitus with unspecified complications: Secondary | ICD-10-CM | POA: Diagnosis not present

## 2023-02-17 DIAGNOSIS — Z7984 Long term (current) use of oral hypoglycemic drugs: Secondary | ICD-10-CM | POA: Insufficient documentation

## 2023-02-17 DIAGNOSIS — E785 Hyperlipidemia, unspecified: Secondary | ICD-10-CM | POA: Diagnosis present

## 2023-02-17 DIAGNOSIS — R739 Hyperglycemia, unspecified: Secondary | ICD-10-CM

## 2023-02-17 DIAGNOSIS — Z79899 Other long term (current) drug therapy: Secondary | ICD-10-CM | POA: Insufficient documentation

## 2023-02-17 DIAGNOSIS — Z794 Long term (current) use of insulin: Secondary | ICD-10-CM | POA: Diagnosis not present

## 2023-02-17 DIAGNOSIS — I10 Essential (primary) hypertension: Secondary | ICD-10-CM | POA: Diagnosis not present

## 2023-02-17 DIAGNOSIS — E66813 Obesity, class 3: Secondary | ICD-10-CM | POA: Diagnosis present

## 2023-02-17 DIAGNOSIS — Z95820 Peripheral vascular angioplasty status with implants and grafts: Secondary | ICD-10-CM

## 2023-02-17 DIAGNOSIS — E119 Type 2 diabetes mellitus without complications: Secondary | ICD-10-CM | POA: Diagnosis not present

## 2023-02-17 DIAGNOSIS — Z955 Presence of coronary angioplasty implant and graft: Secondary | ICD-10-CM | POA: Diagnosis not present

## 2023-02-17 DIAGNOSIS — R072 Precordial pain: Secondary | ICD-10-CM

## 2023-02-17 HISTORY — DX: Other forms of angina pectoris: I20.89

## 2023-02-17 LAB — GLUCOSE, CAPILLARY
Glucose-Capillary: 387 mg/dL — ABNORMAL HIGH (ref 70–99)
Glucose-Capillary: 395 mg/dL — ABNORMAL HIGH (ref 70–99)
Glucose-Capillary: 256 mg/dL — ABNORMAL HIGH (ref 70–99)

## 2023-02-17 LAB — BASIC METABOLIC PANEL
Anion gap: 7 (ref 5–15)
BUN: 29 mg/dL — ABNORMAL HIGH (ref 6–20)
CO2: 31 mmol/L (ref 22–32)
Calcium: 9.4 mg/dL (ref 8.9–10.3)
Chloride: 92 mmol/L — ABNORMAL LOW (ref 98–111)
Creatinine, Ser: 1.1 mg/dL — ABNORMAL HIGH (ref 0.44–1.00)
GFR, Estimated: 58 mL/min — ABNORMAL LOW (ref >60)
Glucose, Bld: 636 mg/dL (ref 70–99)
Potassium: 4.3 mmol/L (ref 3.5–5.1)
Sodium: 130 mmol/L — ABNORMAL LOW (ref 135–145)

## 2023-02-17 LAB — CBC WITH DIFFERENTIAL/PLATELET
Abs Immature Granulocytes: 0.03 10*3/uL (ref 0.00–0.07)
Basophils Absolute: 0 10*3/uL (ref 0.0–0.1)
Basophils Relative: 0 %
Eosinophils Absolute: 0.1 10*3/uL (ref 0.0–0.5)
Eosinophils Relative: 1 %
HCT: 32.9 % — ABNORMAL LOW (ref 36.0–46.0)
Hemoglobin: 10.9 g/dL — ABNORMAL LOW (ref 12.0–15.0)
Immature Granulocytes: 0 %
Lymphocytes Relative: 31 %
Lymphs Abs: 2.6 10*3/uL (ref 0.7–4.0)
MCH: 31 pg (ref 26.0–34.0)
MCHC: 33.1 g/dL (ref 30.0–36.0)
MCV: 93.5 fL (ref 80.0–100.0)
Monocytes Absolute: 0.8 10*3/uL (ref 0.1–1.0)
Monocytes Relative: 10 %
Neutro Abs: 4.8 10*3/uL (ref 1.7–7.7)
Neutrophils Relative %: 58 %
Platelets: 382 10*3/uL (ref 150–400)
RBC: 3.52 MIL/uL — ABNORMAL LOW (ref 3.87–5.11)
RDW: 13.2 % (ref 11.5–15.5)
WBC: 8.4 10*3/uL (ref 4.0–10.5)
nRBC: 0 % (ref 0.0–0.2)

## 2023-02-17 LAB — BRAIN NATRIURETIC PEPTIDE: B Natriuretic Peptide: 10.7 pg/mL (ref 0.0–100.0)

## 2023-02-17 LAB — CBG MONITORING, ED
Glucose-Capillary: 500 mg/dL — ABNORMAL HIGH (ref 70–99)
Glucose-Capillary: 393 mg/dL — ABNORMAL HIGH (ref 70–99)

## 2023-02-17 LAB — TROPONIN I (HIGH SENSITIVITY)
Troponin I (High Sensitivity): 4 ng/L (ref <18)
Troponin I (High Sensitivity): 5 ng/L (ref <18)

## 2023-02-17 LAB — HIV ANTIBODY (ROUTINE TESTING W REFLEX): HIV Screen 4th Generation wRfx: NONREACTIVE

## 2023-02-17 MED ORDER — INSULIN PEN NEEDLE 32G X 4 MM MISC
0 refills | Status: DC
  Filled 2023-02-17: qty 100, 100d supply, fill #0

## 2023-02-17 MED ORDER — INSULIN GLARGINE-YFGN 100 UNIT/ML ~~LOC~~ SOLN
20.0000 [IU] | Freq: Every day | SUBCUTANEOUS | Status: DC
  Administered 2023-02-17: 20 [IU] via SUBCUTANEOUS
  Filled 2023-02-17: qty 0.2

## 2023-02-17 MED ORDER — METFORMIN HCL ER 500 MG PO TB24
500.0000 mg | ORAL_TABLET | Freq: Every day | ORAL | 0 refills | Status: DC
  Filled 2023-02-17: qty 30, 30d supply, fill #0

## 2023-02-17 MED ORDER — ISOSORBIDE MONONITRATE ER 30 MG PO TB24
15.0000 mg | ORAL_TABLET | Freq: Every day | ORAL | Status: DC
  Administered 2023-02-17: 15 mg via ORAL
  Filled 2023-02-17: qty 1

## 2023-02-17 MED ORDER — ONDANSETRON HCL 4 MG/2ML IJ SOLN: 4.0000 mg | Freq: Four times a day (QID) | INTRAMUSCULAR | Status: DC | PRN

## 2023-02-17 MED ORDER — EZETIMIBE 10 MG PO TABS
10.0000 mg | ORAL_TABLET | Freq: Every day | ORAL | Status: DC
  Administered 2023-02-17: 10 mg via ORAL
  Filled 2023-02-17: qty 1

## 2023-02-17 MED ORDER — ACETAMINOPHEN 325 MG PO TABS: 650.0000 mg | ORAL_TABLET | ORAL | Status: DC | PRN

## 2023-02-17 MED ORDER — METOPROLOL SUCCINATE ER 25 MG PO TB24
25.0000 mg | ORAL_TABLET | Freq: Every day | ORAL | Status: DC
  Administered 2023-02-17: 25 mg via ORAL
  Filled 2023-02-17: qty 1

## 2023-02-17 MED ORDER — INSULIN ASPART 100 UNIT/ML IJ SOLN
10.0000 [IU] | Freq: Once | INTRAMUSCULAR | Status: AC
  Administered 2023-02-17: 10 [IU] via SUBCUTANEOUS

## 2023-02-17 MED ORDER — CLOPIDOGREL BISULFATE 75 MG PO TABS
75.0000 mg | ORAL_TABLET | Freq: Every day | ORAL | Status: DC
  Administered 2023-02-17: 75 mg via ORAL
  Filled 2023-02-17: qty 1

## 2023-02-17 MED ORDER — LACTATED RINGERS IV BOLUS
500.0000 mL | Freq: Once | INTRAVENOUS | Status: AC
  Administered 2023-02-17: 500 mL via INTRAVENOUS

## 2023-02-17 MED ORDER — ASPIRIN 81 MG PO CHEW
324.0000 mg | CHEWABLE_TABLET | Freq: Once | ORAL | Status: AC
Start: 2023-02-17 — End: 2023-02-17
  Administered 2023-02-17: 324 mg via ORAL
  Filled 2023-02-17: qty 4

## 2023-02-17 MED ORDER — SERTRALINE HCL 50 MG PO TABS: 50.0000 mg | ORAL_TABLET | Freq: Every day | ORAL | Status: DC

## 2023-02-17 MED ORDER — LIVING WELL WITH DIABETES BOOK
Freq: Once | Status: AC
  Filled 2023-02-17: qty 1

## 2023-02-17 MED ORDER — INSULIN ASPART 100 UNIT/ML IJ SOLN
0.0000 [IU] | Freq: Three times a day (TID) | INTRAMUSCULAR | Status: DC
  Administered 2023-02-17: 15 [IU] via SUBCUTANEOUS
  Administered 2023-02-17: 8 [IU] via SUBCUTANEOUS

## 2023-02-17 MED ORDER — ATORVASTATIN CALCIUM 80 MG PO TABS
80.0000 mg | ORAL_TABLET | Freq: Every day | ORAL | Status: DC
  Administered 2023-02-17: 80 mg via ORAL
  Filled 2023-02-17: qty 1

## 2023-02-17 MED ORDER — INSULIN GLARGINE 100 UNIT/ML SOLOSTAR PEN
40.0000 [IU] | PEN_INJECTOR | Freq: Every day | SUBCUTANEOUS | 0 refills | Status: DC
  Filled 2023-02-17: qty 12, 30d supply, fill #0

## 2023-02-17 MED ORDER — PANTOPRAZOLE SODIUM 20 MG PO TBEC
20.0000 mg | DELAYED_RELEASE_TABLET | Freq: Every day | ORAL | Status: DC
  Administered 2023-02-17: 20 mg via ORAL
  Filled 2023-02-17: qty 1

## 2023-02-17 MED ORDER — DAPAGLIFLOZIN PROPANEDIOL 10 MG PO TABS
10.0000 mg | ORAL_TABLET | Freq: Every day | ORAL | Status: DC
  Administered 2023-02-17: 10 mg via ORAL
  Filled 2023-02-17: qty 1

## 2023-02-17 MED ORDER — ENOXAPARIN SODIUM 40 MG/0.4ML IJ SOSY
40.0000 mg | PREFILLED_SYRINGE | INTRAMUSCULAR | Status: DC
  Administered 2023-02-17: 40 mg via SUBCUTANEOUS
  Filled 2023-02-17: qty 0.4

## 2023-02-17 NOTE — Evaluation (Signed)
Physical Therapy Evaluation Patient Details Name: Cindy [name] MRN: [id number] DOB: 04-Mar-1966 Today's Date: 02/17/2023  History of Present Illness  Patient is a 57 y/o female admitted 02/17/23 with chest pain, LE edema and found to have CBG > 600. PMH positive for CAD s/p DES x 2-LAD, HTN, HLD, DM2, OSA and GERD.  Clinical Impression  Patient presents with mobility close to baseline. Able to ambulate without device in hallway with S to minguard A and negotiated steps appropriate for home entry with min a with rail.  Has DME needed at home and encouraged to use till feeling more stable on her feet and to have daughter present versus using 3:1 in shower when showering initially.  Also reviewed fall prevention and signs/symptoms of low versus high blood glucose.  Patient stable for home without follow up PT.  Will sign off as noted planned d/c today per pt and MD.        Recommendations for follow up therapy are one component of a multi-disciplinary discharge planning process, led by the attending physician.  Recommendations may be updated based on patient status, additional functional criteria and insurance authorization.  Follow Up Recommendations No PT follow up      Assistance Recommended at Discharge Intermittent Supervision/Assistance  Patient can return home with the following  Assist for transportation;Help with stairs or ramp for entrance;A little help with bathing/dressing/bathroom    Equipment Recommendations None recommended by PT  Recommendations for Other Services       Functional Status Assessment Patient has had a recent decline in their functional status and demonstrates the ability to make significant improvements in function in a reasonable and predictable amount of time.     Precautions / Restrictions Precautions Precautions: Fall      Mobility  Bed Mobility Overal bed mobility: Modified Independent                  Transfers Overall transfer  level: Needs assistance Equipment used: None Transfers: Sit to/from Stand Sit to Stand: Supervision           General transfer comment: assist for safety    Ambulation/Gait Ambulation/Gait assistance: Supervision, Min guard Gait Distance (Feet): 200 Feet Assistive device: None Gait Pattern/deviations: Step-through pattern, Decreased stride length, Wide base of support       General Gait Details: mild instability, no LOB noted, minguard at times for safety  Stairs Stairs: Yes Stairs assistance: Min assist Stair Management: One rail Left, Step to pattern, Forwards Number of Stairs: 3 General stair comments: assist for balance/safety using step to sequence and slow technique  Wheelchair Mobility    Modified Rankin (Stroke Patients Only)       Balance Overall balance assessment: Needs assistance   Sitting balance-Leahy Scale: Good     Standing balance support: No upper extremity supported Standing balance-Leahy Scale: Fair                               Pertinent Vitals/Pain Pain Assessment Pain Assessment: No/denies pain    Home Living Family/patient expects to be discharged to:: Private residence Living Arrangements: Alone Available Help at Discharge: Family;Available PRN/intermittently Type of Home: House Home Access: Stairs to enter Entrance Stairs-Rails: Right Entrance Stairs-Number of Steps: 4-5   Home Layout: One level Home Equipment: Conservation officer, nature (2 wheels);Cane - single point;BSC/3in1      Prior Function Prior Level of Function : Independent/Modified Independent  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       [place]: Right    Extremity/Trunk Assessment   Upper Extremity Assessment Upper Extremity Assessment: Overall WFL for tasks assessed    Lower Extremity Assessment Lower Extremity Assessment: Overall WFL for tasks assessed       Communication   Communication: No  difficulties  Cognition Arousal/Alertness: Awake/alert Behavior During Therapy: WFL for tasks assessed/performed Overall Cognitive Status: Within Functional Limits for tasks assessed                                          General Comments General comments (skin integrity, edema, etc.): daughter present and supportive; discussed safety with footwear and lighting and having supervision when showering initially versus placing 3:1 in shower stall if it fits.  Encouraged cane use initially at home as well or walker as needed for fall prevention.  Daughter lives close and can check frequently.  Also reviewed symptoms of low versus high blood sugar, reasons to check BP as pt reports has cuff monitor.    Exercises     Assessment/Plan    PT Assessment Patient does not need any further PT services  PT Problem List         PT Treatment Interventions      PT Goals (Current goals can be found in the Care Plan section)  Acute Rehab PT Goals PT Goal Formulation: All assessment and education complete, DC therapy    Frequency       Co-evaluation               AM-PAC PT "6 Clicks" Mobility  Outcome Measure Help needed turning from your back to your side while in a flat bed without using bedrails?: None Help needed moving from lying on your back to sitting on the side of a flat bed without using bedrails?: None Help needed moving to and from a bed to a chair (including a wheelchair)?: A Little Help needed standing up from a chair using your arms (e.g., wheelchair or bedside chair)?: A Little Help needed to walk in hospital room?: A Little Help needed climbing 3-5 steps with a railing? : A Little 6 Click Score: 20    End of Session Equipment Utilized During Treatment: Gait belt Activity Tolerance: Patient tolerated treatment well Patient left: in bed;with call bell/phone within reach;with family/visitor present   PT Visit Diagnosis: Muscle weakness (generalized)  (M62.81)    Time: [id number] PT Time Calculation (min) (ACUTE ONLY): 23 min   Charges:   PT Evaluation $PT Eval Low Complexity: 1 Low PT Treatments $Self Care/Home Management: 8-22        Cindy [name], PT Acute Rehabilitation Services Office:[phone] 02/17/2023   Reginia Naas 02/17/2023, 1:40 PM

## 2023-02-17 NOTE — TOC Benefit Eligibility Note (Signed)
Patient Teacher, English as a foreign language completed.    Patient Not Eligible Due to Non Payment of Premium. Patient to Contact Plan   The patient is insured through [employer]  This test claim was processed through [place] amounts may vary at other pharmacies due to [employer], or as the patient moves through the different stages of their insurance plan.  Lyndel Safe, [place] Patient Advocate Specialist [place] Patient Advocate Team Direct Number: [phone]  Fax: [phone]

## 2023-02-17 NOTE — ED Notes (Signed)
MD Karle Starch notified of pt BG.

## 2023-02-17 NOTE — ED Triage Notes (Addendum)
Pt c/o of chest pain that started at 2300.  Describes as pressure. Was sitting watching TV. Took 2 nitro with relief. C/o feeling alittle sob. Has a cardiac history-stent placement. C/o of feeling a little dizzy as well especially with cp episode. Denies any cough, fevers. Also c/o swelling to bilateral lower legs. States she has swellling daily, but worse yesterday. Has not been taking her insulin due to insurance not covering the medication.

## 2023-02-17 NOTE — H&P (Signed)
Date: 02/17/2023               Patient Name:  Cindy [name] MRN: [id number]  DOB: Aug 12, 1966 Age / Sex: 57 y.o., female   PCP: Drucie Opitz, MD         Medical Service: Internal Medicine Teaching Service         Attending Physician: Dr. Lucious Groves, DO    First Contact: Dr. Angelique Blonder Pager: [phone]  Second Contact: Dr. Delene Ruffini Pager: [phone]       After Hours (After 5p/  First Contact Pager: [phone]  weekends / holidays): Second Contact Pager: [phone]   Chief Complaint: chest pain  History of Present Illness:   Cindy [name] is a 57 year old female living with HTN, HLD, class III obesity, hx of CVA, uncontrolled T2DM, and CAD s/p stent x2 (06/2020; DES to dLAD and mLAD) presenting after experiencing chest pain at rest.   States that she began experiencing midsternal chest pain last night around 11PM while she was watching TV along with associated SHOB and lightheadedness. Her chest pain remained in the middle of her chest and did not radiate anywhere else. She denies any diaphoresis, N/V, abdominal pain. She took her home SL nitroglycerin x2 with subsequent relief of her chest pain and SHOB. However, her lightheadedness became worse which prompted her to come to [facility] ED for further evaluation. She states that her lightheadedness felt worse when she stood up and she had to remain in place for a few seconds to stabilize herself. She did not experience any falls or LOC. She had mild recurrence of her chest pain when she got to [facility], but this resolved on its own. Since being in the ED, her lightheadedness has improved and she does not note any at this time. She is currently chest pain free and is not experiencing any SHOB.   She has a history of uncontrolled T2DM with last A1c >14% in 05/2022. She has been off of insulin for over a year now due to inability to afford insulin as her insurance did not cover it. Her only diabetic medication at this  time is farxiga, which she reports full adherence to.   She does note that she has had bilateral leg swelling for years now. Her leg swelling becomes worse when sitting up and standing and is typically in dependent location (legs). It does improve when she props her legs up although a trace amount remains. She had an ECHO in 2021 that revealed LVEF 60-65%, normal LV function, no RWMA, mild concentric LVH, no diastolic dysfunction, and no significant valvular abnormalities.   ED course: CBC initially notable for mild anemia of 10.9, but this appears to be around baseline. BMP notable for mild AKI with Cr 1.1 (baseline ~0.75-0.85), glucose 636, normal AG, pseudohyponatremia. BNP 10.7. Troponins 4 > 5. CXR normal without any infiltrates or pulmonary edema noted. She was transferred to [facility] for chest pain observation and IMTS asked to admit to our service.   Meds:  No current facility-administered medications on file prior to encounter.   Current Outpatient Medications on File Prior to Encounter  Medication Sig Dispense Refill   acetaminophen (TYLENOL) 325 MG tablet Take 2 tablets (650 mg total) by mouth every 4 (four) hours as needed for headache or mild pain. (Patient not taking: Reported on 09/14/2022)     atorvastatin (LIPITOR) 80 MG tablet Take 1 tablet (80 mg total) by mouth daily. 90 tablet 3  clopidogrel (PLAVIX) 75 MG tablet Take 1 tablet (75 mg total) by mouth daily. 30 tablet 11   Continuous Blood Gluc Sensor (FREESTYLE LIBRE 3 SENSOR) MISC Place 1 sensor on the skin every 14 days. Use to check glucose continuously 2 each 2   ezetimibe (ZETIA) 10 MG tablet Take 1 tablet (10 mg total) by mouth daily. 90 tablet 3   FARXIGA 10 MG TABS tablet Take 1 tablet (10 mg total) by mouth daily. 30 tablet 6   glucose blood (FREESTYLE LITE) test strip 1 each by Other route as needed (check blood glucose).      hydrochlorothiazide (HYDRODIURIL) 25 MG tablet Take 0.5 tablet by mouth once daily 45 tablet 3    insulin glargine (LANTUS) 100 UNIT/ML injection Inject 0.4 mLs (40 Units total) into the skin daily. 12 mL 0   Insulin Syringes, Disposable, U-100 0.5 ML MISC 30 Units by Does not apply route 2 (two) times daily with a meal. 100 each 3   isosorbide mononitrate (IMDUR) 30 MG 24 hr tablet Take 0.5 tablets (15 mg total) by mouth daily. 45 tablet 3   lisinopril (ZESTRIL) 40 MG tablet Take 1 tablet (40 mg total) by mouth daily. 90 tablet 3   metoprolol succinate (TOPROL-XL) 25 MG 24 hr tablet Take 1 tablet (25 mg total) by mouth daily. 90 tablet 3   nitroGLYCERIN (NITROSTAT) 0.4 MG SL tablet Place 1 tablet (0.4 mg total) under the tongue every 5 (five) minutes as needed for chest pain. 25 tablet 4   pantoprazole (PROTONIX) 20 MG tablet Take 1 tablet (20 mg total) by mouth daily. 90 tablet 0   sertraline (ZOLOFT) 50 MG tablet Take 1 tablet (50 mg total) by mouth at bedtime. 90 tablet 0   [DISCONTINUED] metFORMIN (GLUCOPHAGE) 1000 MG tablet Take 1,000 mg by mouth 2 (two) times daily with a meal.       Allergies: Allergies as of 02/17/2023 - Review Complete 02/17/2023  Allergen Reaction Noted   Other  10/31/2015   Past Medical History:  Diagnosis Date   CAD in native artery 07/09/2020   Coronary artery disease    Diabetes mellitus    GERD (gastroesophageal reflux disease)    Hyperlipemia    Hypertension    S/P angioplasty with stent 07/09/2020   Stroke [facility])     Family History:  Family History  Problem Relation Age of Onset   Diabetes Mother    Hypertension Mother    Breast cancer Mother    Stroke Mother    Hypertension Father    Heart failure Father    Breast cancer Maternal Grandmother    Stroke Brother    Colon cancer Neg Hx    Stomach cancer Neg Hx    Rectal cancer Neg Hx    Esophageal cancer Neg Hx    Liver cancer Neg Hx     Social History:  Social History   Socioeconomic History   Marital status: Single    Spouse name: Not on file   Number of children: 2   Years of  education: 12   Highest education level: Not on file  Occupational History   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  Tobacco Use   Smoking status: Never   Smokeless tobacco: Never  Vaping Use   Vaping Use: Never used  Substance and Sexual Activity   Alcohol use: No   Drug use: No   Sexual activity: Not Currently  Other Topics Concern   Not on file  Social  History Narrative   Not on file   Social Determinants of Health   Financial Resource Strain: Not on file  Food Insecurity: Not on file  Transportation Needs: Not on file  Physical Activity: Not on file  Stress: Not on file  Social Connections: Not on file  Intimate Partner Violence: Not on file     Review of Systems: A complete ROS was negative except as per HPI.   Physical Exam: Blood pressure (!) 144/81, pulse 75, temperature 98 F (36.7 C), temperature source Oral, resp. rate 16, height 5\' 3"  (1.6 m), weight 106.6 kg, SpO2 97 %. Physical Exam Constitutional:      Appearance: She is well-developed. She is obese. She is not ill-appearing.  HENT:     Head: Normocephalic and atraumatic.  Eyes:     Extraocular Movements: Extraocular movements intact.     Pupils: Pupils are equal, round, and reactive to light.  Neck:     Vascular: No JVD.  Cardiovascular:     Rate and Rhythm: Normal rate and regular rhythm.     Heart sounds: Normal heart sounds. No murmur heard.    No friction rub. No gallop.  Pulmonary:     Effort: Pulmonary effort is normal. No respiratory distress.     Breath sounds: Normal breath sounds. No wheezing, rhonchi or rales.  Abdominal:     General: Bowel sounds are normal.     Palpations: Abdomen is soft.     Tenderness: There is no abdominal tenderness. There is no guarding or rebound.  Musculoskeletal:        General: Normal range of motion.     Comments: 1+ pitting edema in bilateral LE (L >R) up to mid-shins. No calf tenderness bilaterally.   Skin:    General: Skin is warm and dry.   Neurological:     General: No focal deficit present.     Mental Status: She is alert and oriented to person, place, and time.  Psychiatric:        Mood and Affect: Mood normal.        Behavior: Behavior normal.     EKG: personally reviewed my interpretation is normal sinus rhythm with RBBB, also noted on prior EKG.  DG Chest Port 1 View Result Date: 02/17/2023 CLINICAL DATA:  Chest pain, shortness of breath EXAM: PORTABLE CHEST 1 VIEW COMPARISON:  02/02/2022 FINDINGS: Eventration of the right hemidiaphragm, stable. Heart and mediastinal contours are within normal limits. No focal opacities or effusions. No acute bony abnormality. IMPRESSION: No active cardiopulmonary disease. Electronically Signed   By: Rolm Baptise M.D.   On: 02/17/2023 02:32     Assessment & Plan by Problem: Principal Problem:   Anginal chest pain at rest Active Problems:   Type 2 diabetes mellitus with complication, with long-term current use of insulin (HCC)   HLD (hyperlipidemia)   Obesity, Class III, BMI 40-49.9 (morbid obesity) (HCC)   Benign essential HTN   CAD in native artery   S/P angioplasty with stent 07/08/20 DES to dLAD and DES to mLAD   Anginal chest pain at rest - resolved HTN HLD Hx of CAD s/p stent x2 (06/2020; DES to dLAD and DES to mLAD) Patient is now chest pain free and her [facility] has resolved with home SL nitroglycerin. Thus far, workup negative for ACS given stable EKG without acute ischemic changes and normal troponins. Cardiology consulted, recommend stress testing to evaluate for cardiac etiology of chest pain as an outpatient. They have arranged for  2-day myoview on 3/28 and 3/29. Have also arranged for outpatient follow up on 4/5. Also advised that echocardiogram does not need to be done as inpatient so will hold off on this. Resumed home medications. -appreciate cardiology recommendations -continue home plavix, toprol-xl, imdur -holding lisinopril and hctz given mild AKI  Mild  AKI Baseline Cr 0.75-0.85. On arrival, Cr 1.1, likely prerenal. She does have lower extremity edema but this appears to be dependent and not cardiac in etiology. Will provide 500cc bolus of fluid for rehydration. Anticipate kidney function will return to baseline with this.  -500cc LR bolus -avoid nephrotoxic medications -holding lisinopril and hctz  Dependent edema Reports lower extremity edema for years now. Had a normal ECHO in 2021 so low suspicion that edema is cardiac in origin. Likely more from sedentary lifestyle and subsequent dependent edema. She does note that edema improves when she elevates her legs. Can consider compression stockings as an outpatient. Low suspicion for DVT but given slight asymmetry in swelling, will obtain to rule out.  -f/u venous dopplers -outpatient compression stockings  Lightheadedness - resolved Began experiencing lightheadedness around time of chest pain. This has since resolved. Orthostatic vitals negative today. Given resolution of this and chest pain as well, no need for further workup at this time.  Uncontrolled T2DM with hyperglycemia Last A1c >14% in 05/2022. She is only taking farxiga 10mg  daily at home. Was previously on insulin therapy but has not been on this for over a year given it has been cost-prohibitive. Will need to discuss with our pharmacy colleagues to determine appropriate insulin at discharge as this does seem vital to better glycemic control. Would also be a great candidate for GLP-1a therapy if insurance is not a barrier. Her CBGs have trended down some, but remaining in upper 300s at this time. Low concern for DKA given no AG. Will check an A1c level. For now, will start semglee at half of previous home dose, resume home farxiga, and start moderate SSI. -trend CBGs, goal 140-180 -f/u A1c -resume home farxiga -semglee 20u daily -moderate SSI -reaching out to pharmacy to determine appropriate long-acting insulin at discharge  Class  III obesity BMI 41.63. She does need extensive counseling on weight loss given history of significant heart disease and concomitant uncontrolled diabetes. She would be a great candidate for GLP-1a therapy, although this may be limited by insurance coverage.   Dispo: Admit patient to Observation with expected length of stay less than 2 midnights.  Signed: Virl Axe, MD 02/17/2023, 10:19 AM  Pager: [phone] After 5pm on weekdays and 1pm on weekends: On Call pager: [phone]

## 2023-02-17 NOTE — ED Notes (Signed)
Called [facility] to transport patient to [facility] rm# [id number]

## 2023-02-17 NOTE — Consult Note (Addendum)
Cardiology Consultation   Patient ID: Cindy [name] MRN: [id number]; DOB: 28-May-1966  Admit date: 02/17/2023 Date of Consult: 02/17/2023  PCP:  Drucie Opitz, [place] [facility] Cardiologist:  Elouise Munroe, MD        Patient Profile:   Cindy [name] is a 57 y.o. female with a hx of CAD s/p DES x 2-LAD in 2021, hypertension, hyperlipidemia, type 2 diabetes, OSA, and GERD who is being seen 02/17/2023 for the evaluation of chest pain at the request of Dr. Allyson Sabal.  History of Present Illness:   [name] presented to the [facility] ED early this morning after she developed squeezing chest discomfort around 11pm last night. Patient says that she was watching a basketball game on TV when she suddenly felt chest pressure, like someone was standing on her chest, and dizziness.  She also reports some mild sensation of dyspnea.  Patient rates severity 9 out of 10.  She took a nitroglycerin which improved discomfort to about a 7 out of 10.  5 minutes later she took a second nitro and pain improved to about 5 out of 10.  Given that pain did not fully resolve after a second nitroglycerin, she decided that she should be evaluated in the emergency department.  Initial lab work was notable for a blood glucose of 636, creatinine 1.1, sodium 130.  Troponin was checked and trended 4->5. Given elevated glucose and chest pain symptoms, patient transferred to [facility] for further evaluation/management.  This afternoon patient says that her overnight episode of chest discomfort has been the only chest discomfort felt since PCI in 2021. She denies changes to exertional tolerance and has not had any chest pain or severe dyspnea with exertion in recent months.    Past Medical History:  Diagnosis Date   CAD in native artery 07/09/2020   Coronary artery disease    Diabetes mellitus    GERD (gastroesophageal reflux disease)    Hyperlipemia    Hypertension    S/P angioplasty  with stent 07/09/2020   Stroke [facility])     Past Surgical History:  Procedure Laterality Date   CARDIAC CATHETERIZATION     CORONARY STENT INTERVENTION N/A 07/08/2020   Procedure: CORONARY STENT INTERVENTION;  Surgeon: Jettie Booze, MD;  Location: [place] CV LAB;  Service: Cardiovascular;  Laterality: N/A;   INTRAVASCULAR ULTRASOUND/IVUS N/A 07/08/2020   Procedure: Intravascular Ultrasound/IVUS;  Surgeon: Jettie Booze, MD;  Location: [place] CV LAB;  Service: Cardiovascular;  Laterality: N/A;   LEFT HEART CATH AND CORONARY ANGIOGRAPHY N/A 07/07/2020   Procedure: LEFT HEART CATH AND CORONARY ANGIOGRAPHY;  Surgeon: Lorretta Harp, MD;  Location: [place] CV LAB;  Service: Cardiovascular;  Laterality: N/A;   None       Home Medications:  Prior to Admission medications   Medication Sig Start Date End Date Taking? Authorizing Provider  acetaminophen (TYLENOL) 325 MG tablet Take 2 tablets (650 mg total) by mouth every 4 (four) hours as needed for headache or mild pain. Patient not taking: Reported on 09/14/2022 07/09/20   Isaiah Serge, NP  atorvastatin (LIPITOR) 80 MG tablet Take 1 tablet (80 mg total) by mouth daily. 09/14/22   Elouise Munroe, MD  clopidogrel (PLAVIX) 75 MG tablet Take 1 tablet (75 mg total) by mouth daily. 12/28/22 12/28/23  Lenna Sciara, NP  Continuous Blood Gluc Sensor (FREESTYLE LIBRE 3 SENSOR) MISC Place 1 sensor on the skin every 14 days. Use  to check glucose continuously 06/10/22   Nooruddin, Marlene Lard, MD  ezetimibe (ZETIA) 10 MG tablet Take 1 tablet (10 mg total) by mouth daily. 12/28/22   Monge, Helane Gunther, NP  FARXIGA 10 MG TABS tablet Take 1 tablet (10 mg total) by mouth daily. 12/03/22   Nooruddin, Marlene Lard, MD  glucose blood (FREESTYLE LITE) test strip 1 each by Other route as needed (check blood glucose).  09/04/19   [provider]  hydrochlorothiazide (HYDRODIURIL) 25 MG tablet Take 0.5 tablet by mouth once daily 12/28/22   Diona Browner C, NP   insulin glargine (LANTUS) 100 UNIT/ML injection Inject 0.4 mLs (40 Units total) into the skin daily. 06/10/22 12/28/22  Nooruddin, Marlene Lard, MD  Insulin Syringes, Disposable, U-100 0.5 ML MISC 30 Units by Does not apply route 2 (two) times daily with a meal. 08/20/18   Kathi Ludwig, MD  isosorbide mononitrate (IMDUR) 30 MG 24 hr tablet Take 0.5 tablets (15 mg total) by mouth daily. 12/28/22 12/23/23  Lenna Sciara, NP  lisinopril (ZESTRIL) 40 MG tablet Take 1 tablet (40 mg total) by mouth daily. 12/28/22   Lenna Sciara, NP  metoprolol succinate (TOPROL-XL) 25 MG 24 hr tablet Take 1 tablet (25 mg total) by mouth daily. 12/28/22   Lenna Sciara, NP  nitroGLYCERIN (NITROSTAT) 0.4 MG SL tablet Place 1 tablet (0.4 mg total) under the tongue every 5 (five) minutes as needed for chest pain. 12/28/22   Lenna Sciara, NP  pantoprazole (PROTONIX) 20 MG tablet Take 1 tablet (20 mg total) by mouth daily. 12/06/22   Elouise Munroe, MD  sertraline (ZOLOFT) 50 MG tablet Take 1 tablet (50 mg total) by mouth at bedtime. 12/27/18   Frann Rider, NP  metFORMIN (GLUCOPHAGE) 1000 MG tablet Take 1,000 mg by mouth 2 (two) times daily with a meal.  10/30/19  [provider]    Inpatient Medications: Scheduled Meds:  atorvastatin  80 mg Oral Daily   dapagliflozin propanediol  10 mg Oral Daily   enoxaparin (LOVENOX) injection  40 mg Subcutaneous Q24H   ezetimibe  10 mg Oral Daily   insulin aspart  0-15 Units Subcutaneous TID WC   insulin glargine-yfgn  20 Units Subcutaneous Daily   isosorbide mononitrate  15 mg Oral Daily   metoprolol succinate  25 mg Oral Daily   pantoprazole  20 mg Oral Daily   sertraline  50 mg Oral QHS   Continuous Infusions:  PRN Meds: acetaminophen, ondansetron (ZOFRAN) IV  Allergies:    Allergies  Allergen Reactions   Other     Hives to an unknown medication given in 2012 at [place] History:   Social History   Socioeconomic History   Marital status:  Single    Spouse name: Not on file   Number of children: 2   Years of education: 12   Highest education level: Not on file  Occupational History   Occupation: [employer]  Tobacco Use   Smoking status: Never   Smokeless tobacco: Never  Vaping Use   Vaping Use: Never used  Substance and Sexual Activity   Alcohol use: No   Drug use: No   Sexual activity: Not Currently  Other Topics Concern   Not on file  Social History Narrative   Not on file   Social Determinants of Health   Financial Resource Strain: Not on file  Food Insecurity: Not on file  Transportation Needs: Not on file  Physical  Activity: Not on file  Stress: Not on file  Social Connections: Not on file  Intimate Partner Violence: Not on file    Family History:    Family History  Problem Relation Age of Onset   Diabetes Mother    Hypertension Mother    Breast cancer Mother    Stroke Mother    Hypertension Father    Heart failure Father    Breast cancer Maternal Grandmother    Stroke Brother    Colon cancer Neg Hx    Stomach cancer Neg Hx    Rectal cancer Neg Hx    Esophageal cancer Neg Hx    Liver cancer Neg Hx      ROS:  Please see the history of present illness.   All other ROS reviewed and negative.     Physical Exam/Data:   Vitals:   02/17/23 0600 02/17/23 0700 02/17/23 0835 02/17/23 1037  BP: 136/71 119/67 (!) 144/81 119/72  Pulse: 79 72 75 78  Resp: 17 16 16    Temp:   98 F (36.7 C)   TempSrc:   Oral   SpO2: 99% 100% 97%   Weight:      Height:       No intake or output data in the 24 hours ending 02/17/23 1124    02/17/2023    1:59 AM 12/28/2022   10:58 AM 09/14/2022    1:37 PM  Last 3 Weights  Weight (lbs) 235 lb 234 lb 232 lb 12.8 oz  Weight (kg) 106.595 kg 106.142 kg 105.597 kg     Body mass index is 41.63 kg/m.  General:  Well nourished, well developed, in no acute distress HEENT: normal Neck: no JVD Vascular: No carotid bruits; Distal pulses 2+  bilaterally Cardiac:  normal S1, S2; RRR; no murmur  Lungs:  clear to auscultation bilaterally, no wheezing, rhonchi or rales  Abd: soft, nontender, no hepatomegaly  Ext: no edema Musculoskeletal:  No deformities, BUE and BLE strength normal and equal Skin: warm and dry  Neuro:  CNs 2-12 intact, no focal abnormalities noted Psych:  Normal affect   EKG:  The EKG was personally reviewed and demonstrates:  RBBB with first degree AV block. No acute ischemic changes Telemetry:  Telemetry was personally reviewed and demonstrates:  sinus rhythm with long PR.  Relevant CV Studies:  07/07/20 TTE  IMPRESSIONS     1. Left ventricular ejection fraction, by estimation, is 60 to 65%. The  left ventricle has normal function. The left ventricle has no regional  wall motion abnormalities. There is mild concentric left ventricular  hypertrophy. Left ventricular diastolic  parameters were normal.   2. Right ventricular systolic function is normal. The right ventricular  size is normal. Tricuspid regurgitation signal is inadequate for assessing  PA pressure.   3. The mitral valve is grossly normal. Trivial mitral valve  regurgitation. No evidence of mitral stenosis.   4. The aortic valve is tricuspid. Aortic valve regurgitation is not  visualized. No aortic stenosis is present.   5. The inferior vena cava is normal in size with greater than 50%  respiratory variability, suggesting right atrial pressure of 3 mmHg.   FINDINGS   Left Ventricle: Left ventricular ejection fraction, by estimation, is 60  to 65%. The left ventricle has normal function. The left ventricle has no  regional wall motion abnormalities. The left ventricular internal cavity  size was normal in size. There is   mild concentric left ventricular hypertrophy. Left ventricular diastolic  parameters were normal. Normal left ventricular filling pressure.   Right Ventricle: The right ventricular size is normal. No increase in   right ventricular wall thickness. Right ventricular systolic function is  normal. Tricuspid regurgitation signal is inadequate for assessing PA  pressure.   Left Atrium: Left atrial size was normal in size.   Right Atrium: Right atrial size was normal in size.   Pericardium: Trivial pericardial effusion is present. Presence of  pericardial fat pad.   Mitral Valve: The mitral valve is grossly normal. Trivial mitral valve  regurgitation. No evidence of mitral valve stenosis.   Tricuspid Valve: The tricuspid valve is grossly normal. Tricuspid valve  regurgitation is trivial. No evidence of tricuspid stenosis.   Aortic Valve: The aortic valve is tricuspid. Aortic valve regurgitation is  not visualized. No aortic stenosis is present.   Pulmonic Valve: The pulmonic valve was grossly normal. Pulmonic valve  regurgitation is not visualized.   Aorta: The aortic root and ascending aorta are structurally normal, with  no evidence of dilitation.   Venous: The inferior vena cava is normal in size with greater than 50%  respiratory variability, suggesting right atrial pressure of 3 mmHg.   IAS/Shunts: The atrial septum is grossly normal.      07/09/2020 LHC  Dist LAD lesion is 80% stenosed. A drug-eluting stent was successfully placed using a STENT RESOLUTE ONYX 2.5X18, postdilated with a 3.0 [place] balloon and optimized wth IVUS. Post intervention, there is a 0% residual stenosis. Mid LAD lesion is 80% stenosed. Minimal calcification by IVUS so atherectomy was not required. A drug-eluting stent was successfully placed using a STENT RESOLUTE ONYX 3.5X22, postdilated with a 4.0 [place] balloon and optimized wth IVUS. Post intervention, there is a 0% residual stenosis.    Diagnostic Dominance: Co-dominant  Intervention    Laboratory Data:  High Sensitivity Troponin:   Recent Labs  Lab 02/17/23 0207 02/17/23 0407  TROPONINIHS 4 5     Chemistry Recent Labs  Lab 02/17/23 0207  NA  130*  K 4.3  CL 92*  CO2 31  GLUCOSE 636*  BUN 29*  CREATININE 1.10*  CALCIUM 9.4  GFRNONAA 58*  ANIONGAP 7    No results for input(s): "PROT", "ALBUMIN", "AST", "ALT", "ALKPHOS", "BILITOT" in the last 168 hours. Lipids No results for input(s): "CHOL", "TRIG", "HDL", "LABVLDL", "LDLCALC", "CHOLHDL" in the last 168 hours.  Hematology Recent Labs  Lab 02/17/23 0207  WBC 8.4  RBC 3.52*  HGB 10.9*  HCT 32.9*  MCV 93.5  MCH 31.0  MCHC 33.1  RDW 13.2  PLT 382   Thyroid No results for input(s): "TSH", "FREET4" in the last 168 hours.  BNP Recent Labs  Lab 02/17/23 0213  BNP 10.7    DDimer No results for input(s): "DDIMER" in the last 168 hours.   Radiology/Studies:  DG Chest Port 1 View  Result Date: 02/17/2023 CLINICAL DATA:  Chest pain, shortness of breath EXAM: PORTABLE CHEST 1 VIEW COMPARISON:  02/02/2022 FINDINGS: Eventration of the right hemidiaphragm, stable. Heart and mediastinal contours are within normal limits. No focal opacities or effusions. No acute bony abnormality. IMPRESSION: No active cardiopulmonary disease. Electronically Signed   By: Rolm Baptise M.D.   On: 02/17/2023 02:32     Assessment and Plan:  Annelyse Waheed is a 57 y.o. female with a hx of CAD s/p DES x 2-LAD in 2021, hypertension, hyperlipidemia, type 2 diabetes, OSA, and GERD who is being seen 02/17/2023 for the evaluation of chest pain  at the request of Dr. Allyson Sabal.  Chest pain Hx CAD with LAD PCI x2  Patient presented to [facility] ED overnight with squeezing chest pain. Found to have stable ECG and negative/flat troponin 4->5. Hx DES x2 to LAD 07/09/2020.   Workup thus far reassuring against ACS, though given CAD hx, would recommend stress testing to further evaluate possible cardiac etiology of chest pain. With stable/negative troponin and stable ECG, this can be done as outpatient. Patient scheduled for two day Myoview on 3/28 and 3/29. Will also arrange outpatient follow  up. Echocardiogram has been ordered by primary team. If patient remains admitted for hyperglycemia, can complete but from cardiology perspective, this should not be a barrier to discharge. Continue Plavix 75mg , HCTZ, Lisinopril, Metoprolol, Imdur, Lipitor, Zetia.  Hypertension  BP at goal. Continue HCTZ, Lisinopril, Metoprolol, Imdur.  Hyperlipidemia  LDL at goal as of January. Continue Lipitor and Zetia.  OSA  Continue nightly CPAP  Per primary team:  DM type II   Risk Assessment/Risk Scores:                For questions or updates, please contact [place] Please consult [email or web address] for contact info under    Signed, Lily Kocher, PA-C  02/17/2023 11:24 AM  Personally seen and examined. Agree with above.  57 year old with CAD prior DES to LAD in 2021, uncontrolled DM (glucose 600) with chest pain.  Trop are normal ECG unchanged, no ischemic changes  AAO x 3, RRR  A/P: Chest pain CAD LAD stent HTN HL OSA Uncontrolled DM  - Since Troponins are normal, I am comfortable with her being DC'd from cardiac perspective with NUC stress test as outpatient.   - Continue current meds  - Try to optimize blood glucose  Candee Furbish, MD

## 2023-02-17 NOTE — Discharge Summary (Addendum)
Name: Cindy [name] MRN: [id number] DOB: [birth year] 57 y.o. PCP: Cindy Opitz, MD  Date of Admission: 02/17/2023  1:55 AM Date of Discharge: 02/17/2023 Attending Physician: Lucious Groves, DO  Discharge Diagnosis: 1. Principal Problem:   Anginal chest pain at rest Active Problems:   Type 2 diabetes mellitus with complication, with long-term current use of insulin (HCC)   HLD (hyperlipidemia)   Obesity, Class III, BMI 40-49.9 (morbid obesity) (HCC)   Benign essential HTN   CAD in native artery   S/P angioplasty with stent 07/08/20 DES to dLAD and DES to mLAD     Discharge Medications: Allergies as of 02/17/2023       Reactions   Other    Hives to an unknown medication given in 2012 at [facility]         Medication List     TAKE these medications    acetaminophen 325 MG tablet Commonly known as: TYLENOL Take 2 tablets (650 mg total) by mouth every 4 (four) hours as needed for headache or mild pain.   atorvastatin 80 MG tablet Commonly known as: Lipitor Take 1 tablet (80 mg total) by mouth daily.   clopidogrel 75 MG tablet Commonly known as: Plavix Take 1 tablet (75 mg total) by mouth daily.   ezetimibe 10 MG tablet Commonly known as: ZETIA Take 1 tablet (10 mg total) by mouth daily.   Farxiga 10 MG Tabs tablet Generic drug: dapagliflozin propanediol Take 1 tablet (10 mg total) by mouth daily.   FreeStyle Libre 3 Sensor Misc Place 1 sensor on the skin every 14 days. Use to check glucose continuously   FREESTYLE LITE test strip Generic drug: glucose blood 1 each by Other route as needed (check blood glucose).   hydrochlorothiazide 25 MG tablet Commonly known as: HYDRODIURIL Take 0.5 tablet by mouth once daily What changed:  how much to take how to take this when to take this additional instructions   insulin glargine 100 UNIT/ML injection Commonly known as: LANTUS Inject 0.4 mLs (40 Units total) into the skin daily.   Insulin Syringes  (Disposable) U-100 0.5 ML Misc 30 Units by Does not apply route 2 (two) times daily with a meal.   isosorbide mononitrate 30 MG 24 hr tablet Commonly known as: IMDUR Take 0.5 tablets (15 mg total) by mouth daily.   lisinopril 40 MG tablet Commonly known as: ZESTRIL Take 1 tablet (40 mg total) by mouth daily.   metFORMIN 500 MG 24 hr tablet Commonly known as: GLUCOPHAGE-XR Take 1 tablet (500 mg total) by mouth daily with breakfast.   metoprolol succinate 25 MG 24 hr tablet Commonly known as: TOPROL-XL Take 1 tablet (25 mg total) by mouth daily.   nitroGLYCERIN 0.4 MG SL tablet Commonly known as: NITROSTAT Place 1 tablet (0.4 mg total) under the tongue every 5 (five) minutes as needed for chest pain.   pantoprazole 20 MG tablet Commonly known as: PROTONIX Take 1 tablet (20 mg total) by mouth daily.   sertraline 50 MG tablet Commonly known as: Zoloft Take 1 tablet (50 mg total) by mouth at bedtime.        Disposition and follow-up:   [name] was discharged from [facility] in Stable condition.  At the hospital follow up visit please address:  1.  Anginal chest pain at rest: Resolved. Cardiac workup negative for ACS. Cardiology recommending stress testing, 2-day myoview scheduled for 3/28 and 3/29. Has outpatient cardiology follow up 4/5. Resumed  home medications at discharge with no changes.   2. Mild AKI: likely volume depleted given significant hyperglycemia. Gave small IV fluid bolus, should resolve with this. Repeat BMP at [facility] f/u to ensure resolution.  3. Dependent edema: ongoing for years with normal ECHO in 2021. Likely not cardiac in origin. Venous dopplers negative. Consider compression stockings at discharge. Advised to elevate legs.   4. Uncontrolled T2DM: Last A1c >14% in 05/2022, repeat A1c pending. Unable to afford insulin as not covered currently through insurance (has unpaid [employer]). Did not tolerate ozempic and  soliqua in the past (was started at max dose). Continuing farxiga and starting metformin-XR at 500mg  daily (up-titrate as able). Starting lantus at 40u daily at discharge (obtained for $35 with cash savings card). Will need close follow up for management of diabetes.  2.  Labs / imaging needed at time of follow-up: BMP  3.  Pending labs/ test needing follow-up: A1c, HIV Ab  Follow-up Appointments:   Hospital Course by problem list:  Anginal chest pain at rest - resolved HTN HLD Hx of CAD s/p stent x2 (06/2020; DES to dLAD and DES to mLAD) Presented after experiencing angina at rest x2 with associated SHOB and lightheadedness. This resolved after taking home SL nitroglycerin x2. Workup was negative for ACS given stable EKG without ischemic changes and normal troponins. BNP also normal. Cardiology was consulted, recommended stress testing to evaluate for cardiac etiology of chest pain as an outpatient. They have arranged for a 2-day Myoview on 3/28 and 3/29. Have also arranged for outpatient cardiology follow up on 4/5. Did not advise for inpatient echocardiogram, so this is likely something that can be done as an outpatient. Restarted her home antihypertensives while here (aside from lisinopril and hctz given mild AKI although she was adherent with these PTA). Will continue home medications at discharge (plavix, metoprolol, imdur, lisinopril, hctz, lipitor, zetia). Have messaged front desk staff at [facility] to schedule 1 week follow up.   Mild AKI Baseline Cr 0.75-0.85. On arrival, Cr 1.1, likely prerenal. She does have lower extremity edema but this appears to be dependent and not cardiac in etiology. Provided a 500cc bolus of IV fluid for rehydration. Anticipate kidney function will return to baseline with this. Will follow up at [facility] in 1 week to recheck BMP and ensure resolution.  Dependent edema Reports lower extremity edema for years now. Had a normal ECHO in 2021 so low suspicion that edema is  cardiac in origin. Likely more from sedentary lifestyle and subsequent dependent edema. She does note that edema improves when she elevates her legs. Venous dopplers negative for DVT. Can consider compression stockings as an outpatient. Can consider echocardiogram as an outpatient if needed. Advised leg elevation at discharge.  Lightheadedness - resolved Began experiencing lightheadedness around time of chest pain. This has since resolved. Orthostatic vitals negative. Given resolution of this and chest pain as well, no need for further workup at this time.   Uncontrolled T2DM with hyperglycemia Last A1c >14% in 05/2022, repeat A1c pending. CBG 600s on arrival, normal AG so not in DKA. Has trended down with addition of insulin. Has been off of insulin for over a year as unable to afford due to insurance not covering it. She has unpaid premiums and thus insurance was not able to cover. She was only on farxiga 10mg  daily. She was previously on metformin but was unable to tolerate higher doses (1000mg  BID) due to GI upset. She was also unable to tolerate ozempic (  started at low dose and titrated up) or soliqua (was started at max dose). She does need insulin moving forward for better glycemic control. Our pharmacy colleagues and TOC were able to obtain a cash savings card for her to obtain lantus 40u daily for $35 which is something she can afford. We also discussed starting metformin-XR at 500mg  daily (lower GI symptoms), with plan to uptitrate as able in the outpatient setting. Could consider mounjaro in the outpatient setting at low dose for added weight loss benefit if insurance allows. Will need close follow up at [facility] for diabetes management in the outpatient setting. A1c is still pending so will need to review at hospital follow up visit.   Class III obesity BMI 41.63. She does need extensive counseling on weight loss given history of significant heart disease and concomitant uncontrolled diabetes. She  would be a great candidate for GLP-1a therapy, although this may be limited by insurance coverage.   Discharge Exam:   BP 119/69 (BP Location: Right Arm)   Pulse 79   Temp 98.4 F (36.9 C) (Oral)   Resp 18   Ht 5\' 3"  (1.6 m)   Wt 106.6 kg   SpO2 97%   BMI 41.63 kg/m   Same day discharge, please see H&P from earlier for physical exam and other details.  Pertinent Labs, Studies, and Procedures:      Latest Ref Rng & Units 02/17/2023    2:07 AM 07/03/2022   11:19 AM 02/02/2022    5:37 PM  CBC  WBC 4.0 - 10.5 K/uL 8.4  8.7  8.9   Hemoglobin 12.0 - 15.0 g/dL 10.9  9.5  10.9   Hematocrit 36.0 - 46.0 % 32.9  31.4  36.2   Platelets 150 - 400 K/uL 382  435  423         Latest Ref Rng & Units 02/17/2023    2:07 AM 07/03/2022   11:19 AM 06/10/2022   12:14 PM  BMP  Glucose 70 - 99 mg/dL 636  304  327   BUN 6 - 20 mg/dL 29  19  16    Creatinine 0.44 - 1.00 mg/dL 1.10  0.75  0.85   BUN/Creat Ratio 9 - 23   19   Sodium 135 - 145 mmol/L 130  138  137   Potassium 3.5 - 5.1 mmol/L 4.3  3.8  4.5   Chloride 98 - 111 mmol/L 92  99  100   CO2 22 - 32 mmol/L 31  25  21    Calcium 8.9 - 10.3 mg/dL 9.4  9.5  9.2    HS Troponins: 4 > 5  BNP    Component Value Date/Time   BNP 10.7 02/17/2023 0213    CBG (last 3)  Recent Labs    02/17/23 0647 02/17/23 0850 02/17/23 1219  GLUCAP 393* 387* 395*    DG Chest Port 1 View Result Date: 02/17/2023 CLINICAL DATA:  Chest pain, shortness of breath EXAM: PORTABLE CHEST 1 VIEW COMPARISON:  02/02/2022 FINDINGS: Eventration of the right hemidiaphragm, stable. Heart and mediastinal contours are within normal limits. No focal opacities or effusions. No acute bony abnormality. IMPRESSION: No active cardiopulmonary disease. Electronically Signed   By: Rolm Baptise M.D.   On: 02/17/2023 02:32      Discharge Instructions: Discharge Instructions     Diet - low sodium heart healthy   Complete by: As directed    Discharge instructions   Complete by: As  directed  Ms. Forbess, you came in with chest pain but everything looks fine at this time. The cardiology team saw you and have recommended to get some further testing done outside of the hospital. Please review the following:  For your chest pain: 1. Please go to your cardiology appointments on 3/28 and 3/29 at 11AM both days for further testing. 2. Please go to the cardiology office visit on 4/5 for follow up. 3. It is okay to restart taking your home medications from tomorrow.  For your diabetes: 1. You are getting lantus (long-acting insulin) before you leave the hospital. Please take 40 units daily starting from tomorrow.  2. I have also sent a prescription of Metformin to the [facility]. Please take 1 tablet a day starting today. 3. Continue taking your farxiga as you have been. 4. Make sure you stay well-hydrated at home. 5. I have messaged the [facility] staff to schedule a hospital follow up visit with you in 1 week. If you do not hear from them in the next day or so, please give them a call to schedule this visit.   Increase activity slowly   Complete by: As directed        Signed: Virl Axe, MD 02/17/2023, 3:37 PM   Pager: [phone]

## 2023-02-17 NOTE — Inpatient Diabetes Management (Signed)
Inpatient Diabetes Program Recommendations  AACE/ADA: New Consensus Statement on Inpatient Glycemic Control (2015)  Target Ranges:  Prepandial:   less than 140 mg/dL      Peak postprandial:   less than 180 mg/dL (1-2 hours)      Critically ill patients:  140 - 180 mg/dL   Lab Results  Component Value Date   GLUCAP 395 (H) 02/17/2023   HGBA1C >14.0 06/10/2022    Review of Glycemic Control  Latest Reference Range & Units 02/17/23 04:34 02/17/23 06:47 02/17/23 08:50 02/17/23 12:19  Glucose-Capillary 70 - 99 mg/dL 500 (H) 393 (H) 387 (H) 395 (H)  (H): Data is abnormally high  Diabetes history: DM2 Outpatient Diabetes medications: none Current orders for Inpatient glycemic control: Semglee 20 units QD, Novolog 0-15 units TID and Farxiga 10 qd  Inpatient Diabetes Program Recommendations:    [employer] brand Novolin ReliOn 70/30 20 units BID for discharge which has long acting and rapid insulin.  Will also need insulin pen needles-Order # [id number].    Spoke with patient at bedside.  She states she used to take Novolog 70/30-25 units BID.  She did not pay her insurance premiums and she could not afford $1200 for her insulin so she stopped taking it.  She has not taken insulin in > 1 year.    Patient arrived to [facility] with glucose of 636 mg/dL.    Explained to patient and her daughter that they can purchase the [employer] ReiOn Novolin 70/30 for $43 for a box of 5 insulin pens or $25 for a vial.  Daughter states they can afford this.    She does have a PCP and is aware that she will need close follow up.    She is aware of hypoglycemia, signs, symptoms and treatment.  She has a glucometer at home.    Will continue to follow while inpatient.  Thank you, Cindy Dixon, MSN, [place] Diabetes Coordinator Inpatient Diabetes Program [phone] (team pager from 8a-5p)

## 2023-02-17 NOTE — Plan of Care (Signed)
IM teaching service notified of the patient's arrival from [place].

## 2023-02-17 NOTE — ED Provider Notes (Signed)
[facility]  Provider Note  CSN: [id number] Arrival date & time: 02/17/23 0154  History Chief Complaint  Patient presents with  . Chest Pain    Cindy [name] is a 57 y.o. female with history of HTN, HLD, DM an CAD s/p DES x2 to LAD in 2021 currently on Plavix monotherapy reports about 2300hrs tonight she was watching TV when she had an episode of midsternal chest pressure, associated with SOB and dizziness. Pain was relieved with NTG x 2. She reports she is pain free now. SOB has also resolved. She still feels a little dizzy. She has also noted BLE edema, off and on chronically but more significant in the last 2 days, associated with DOE and Orthopnea. No prior history of CHF, last echo was in 2021.   Home Medications Prior to Admission medications   Medication Sig Start Date End Date Taking? Authorizing Provider  acetaminophen (TYLENOL) 325 MG tablet Take 2 tablets (650 mg total) by mouth every 4 (four) hours as needed for headache or mild pain. Patient not taking: Reported on 09/14/2022 07/09/20   Isaiah Serge, NP  atorvastatin (LIPITOR) 80 MG tablet Take 1 tablet (80 mg total) by mouth daily. 09/14/22   Elouise Munroe, MD  clopidogrel (PLAVIX) 75 MG tablet Take 1 tablet (75 mg total) by mouth daily. 12/28/22 12/28/23  Lenna Sciara, NP  Continuous Blood Gluc Sensor (FREESTYLE LIBRE 3 SENSOR) MISC Place 1 sensor on the skin every 14 days. Use to check glucose continuously 06/10/22   Nooruddin, Marlene Lard, MD  ezetimibe (ZETIA) 10 MG tablet Take 1 tablet (10 mg total) by mouth daily. 12/28/22   Monge, Helane Gunther, NP  FARXIGA 10 MG TABS tablet Take 1 tablet (10 mg total) by mouth daily. 12/03/22   Nooruddin, Marlene Lard, MD  glucose blood (FREESTYLE LITE) test strip 1 each by Other route as needed (check blood glucose).  09/04/19   [provider]  hydrochlorothiazide (HYDRODIURIL) 25 MG tablet Take 0.5 tablet by mouth once daily 12/28/22   Diona Browner C, NP  insulin glargine (LANTUS) 100 UNIT/ML injection Inject 0.4 mLs (40 Units total) into the skin daily. 06/10/22 12/28/22  Nooruddin, Marlene Lard, MD  Insulin Syringes, Disposable, U-100 0.5 ML MISC 30 Units by Does not apply route 2 (two) times daily with a meal. 08/20/18   Kathi Ludwig, MD  isosorbide mononitrate (IMDUR) 30 MG 24 hr tablet Take 0.5 tablets (15 mg total) by mouth daily. 12/28/22 12/23/23  Lenna Sciara, NP  lisinopril (ZESTRIL) 40 MG tablet Take 1 tablet (40 mg total) by mouth daily. 12/28/22   Lenna Sciara, NP  metoprolol succinate (TOPROL-XL) 25 MG 24 hr tablet Take 1 tablet (25 mg total) by mouth daily. 12/28/22   Lenna Sciara, NP  nitroGLYCERIN (NITROSTAT) 0.4 MG SL tablet Place 1 tablet (0.4 mg total) under the tongue every 5 (five) minutes as needed for chest pain. 12/28/22   Lenna Sciara, NP  pantoprazole (PROTONIX) 20 MG tablet Take 1 tablet (20 mg total) by mouth daily. 12/06/22   Elouise Munroe, MD  sertraline (ZOLOFT) 50 MG tablet Take 1 tablet (50 mg total) by mouth at bedtime. 12/27/18   Frann Rider, NP  metFORMIN (GLUCOPHAGE) 1000 MG tablet Take 1,000 mg by mouth 2 (two) times daily with a meal.  10/30/19  [provider]     Allergies    Other   Review of Systems   Review  of Systems Please see HPI for pertinent positives and negatives  Physical Exam BP 109/69   Pulse 80   Temp 97.7 F (36.5 C) (Oral)   Resp 17   Ht 5\' 3"  (1.6 m)   Wt 106.6 kg   SpO2 98%   BMI 41.63 kg/m   Physical Exam Vitals and nursing note reviewed.  Constitutional:      Appearance: Normal appearance.  HENT:     Head: Normocephalic and atraumatic.     Nose: Nose normal.     Mouth/Throat:     Mouth: Mucous membranes are moist.  Eyes:     Extraocular Movements: Extraocular movements intact.     Conjunctiva/sclera: Conjunctivae normal.  Cardiovascular:     Rate and Rhythm: Normal rate.  Pulmonary:     Effort: Pulmonary effort is normal.     Breath  sounds: Normal breath sounds.  Abdominal:     General: Abdomen is flat.     Palpations: Abdomen is soft.     Tenderness: There is no abdominal tenderness.  Musculoskeletal:        General: No swelling. Normal range of motion.     Cervical back: Neck supple.     Right lower leg: Edema (2+ to knee) present.     Left lower leg: Edema (2+ to knee) present.  Skin:    General: Skin is warm and dry.  Neurological:     General: No focal deficit present.     Mental Status: She is alert.  Psychiatric:        Mood and Affect: Mood normal.     ED Results / Procedures / Treatments   EKG EKG Interpretation  Date/Time:  Thursday February 17 2023 02:03:51 EDT Ventricular Rate:  84 PR Interval:  154 QRS Duration: 142 QT Interval:  419 QTC Calculation: 496 R Axis:   8 Text Interpretation: Sinus or ectopic atrial rhythm Right bundle branch block No significant change since last tracing Confirmed by Calvert Cantor [phone]) on 02/17/2023 2:05:23 AM  Procedures Procedures  Medications Ordered in the ED Medications  aspirin chewable tablet 324 mg (324 mg Oral Given 02/17/23 0222)  insulin aspart (novoLOG) injection 10 Units (10 Units Subcutaneous Given 02/17/23 0330)    Initial Impression and Plan  Patient with significant risk factor profile for ACS here with an episode of chest pain, SOB relieved with NTG. Has also had LE edema, orthopnea and DOE recently concerning for acute CHF. She is well appearing and pain free at the time of my evaluation. Will check labs, CXR. ASA given. Anticipate admission for further evaluation.   ED Course   Clinical Course as of 02/17/23 0446  Thu Feb 17, 2023  [id number] I personally viewed the images from radiology studies and agree with radiologist interpretation: CXR is clear.  [CS]  [id number] CBC with mild anemia, improved from previous.  [CS]  [id number] BNP is normal. Trop #1 is neg.  [CS]  [id number] BMP with marked hyperglycemia, but no acidosis or anion gap. Patient reports  she has been taking her oral meds, but not her insulin due to insurance not covering the Rx. Will give a dose of insulin, plan admission for further evaluation of angina and hyperglycemia.  [CS]  [phone] Spoke with Dr. Glo Herring, Hospitalist, who will accept for admission.  [CS]  0442 CBG improving, continue to monitor.  [CS]  [id number] Repeat Trop remains negative.  [CS]    Clinical Course User Index [CS] Truddie Hidden, MD  MDM Rules/Calculators/A&P Medical Decision Making Problems Addressed: Angina at rest: acute illness or injury that poses a threat to life or bodily functions Hyperglycemia: acute illness or injury that poses a threat to life or bodily functions  Amount and/or Complexity of Data Reviewed Labs: ordered. Decision-making details documented in ED Course. Radiology: ordered and independent interpretation performed. Decision-making details documented in ED Course. ECG/medicine tests: ordered and independent interpretation performed. Decision-making details documented in ED Course.  Risk OTC drugs. Prescription drug management. Decision regarding hospitalization.     Final Clinical Impression(s) / ED Diagnoses Final diagnoses:  Angina at rest  Hyperglycemia    Rx / DC Orders ED Discharge Orders     None        Truddie Hidden, MD 02/17/23 [phone]

## 2023-02-17 NOTE — Discharge Instructions (Addendum)
   You have a Stress Test scheduled at [facility]. Your doctor has ordered this test to check the blood flow in your heart arteries.  Please arrive 15 minutes early for paperwork. The whole test will take several hours. You may want to bring reading material to remain occupied while undergoing different parts of the test.  Instructions: No food/drink after midnight the night before. It is OK to take your morning meds with a sip of water EXCEPT for those types of medicines listed below or otherwise instructed. No caffeine/decaf products 24 hours before, including medicines such as Excedrin or Goody Powders. Call if there are any questions.  Wear comfortable clothes and shoes.   Special Medication Instructions: Beta blockers such as metoprolol (Lopressor/Toprol XL), atenolol (Tenormin), carvedilol (Coreg), nebivolol (Bystolic), bisoprolol (Zebeta), propranolol (Inderal) should not be taken for 24 hours before the test. Calcium channel blockers such as diltiazem (Cardizem) or verapmil (Calan) should not be taken for 24 hours before the test. Remove nitroglycerin patches and do not take nitrate preparations such as Imdur/isosorbide the day of your test. No Persantine/Theophylline or Aggrenox medicines should be used within 24 hours of the test.  If you are diabetic, please ask which medications to hold the day of the test.  What To Expect: When you arrive in the lab, the technician will inject a small amount of radioactive tracer into your arm through an IV while you are resting quietly. This helps [place] to form pictures of your heart. You will likely only feel a sting from the IV. After a waiting period, resting pictures will be obtained under a big camera. These are the "before" pictures.  Next, you will be prepped for the stress portion of the test. This may include either walking on a treadmill or receiving a medicine that helps to dilate blood vessels in your heart to simulate the  effect of exercise on your heart. If you are walking on a treadmill, you will walk at different paces to try to get your heart rate to a goal number that is based on your age. If your doctor has chosen the pharmacologic test, then you will receive a medicine through your IV that may cause temporary nausea, flushing, shortness of breath and sometimes chest discomfort or vomiting. This is typically short-lived and usually resolves quickly. If you experience symptoms, that does not automatically mean the test is abnormal. Some patients do not experience any symptoms at all. Your blood pressure and heart rate will be monitored, and we will be watching your EKG on a computer screen for any changes. During this portion of the test, the radiologist will inject another small amount of radioactive tracer into your IV. After a waiting period, you will undergo a second set of pictures. These are the "after" pictures.  The doctor reading the test will compare the before-and-after images to look for evidence of heart blockages or heart weakness. The test usually takes 1 day to complete, but in certain instances (for example, if a patient is over a certain weight limit), the test may be done over the span of 2 days.

## 2023-02-17 NOTE — Progress Notes (Signed)
Hospitalist Transfer Note:  Transferring facility: [facility] Requesting provider: Dr. Calvert Cantor (EDP at [facility]) Reason for transfer: admission for further evaluation and management of chest pain along with hyperglycemia.    57  year old female with medical history notable for CAD s/p stents x 2 in 2021, diabetes, hypertension, hyperlipidemia, who presented to [facility] ED complaining of a single episode of substernal chest pressure that started at 2300 on 02/16/2023, and resolved following 2 sublingual nitroglycerin taken at home.  This episode was associated with shortness of breath.  No ensuing or residual chest pain or sob reported.  EKG reportedly shows no evidence of acute ischemic changes, while troponin is nonelevated, and chest x-ray shows no evidence of acute process.  Patient also notes that she has been experiencing some new bilateral lower extremity edema over the last few days.   Additionally, she acknowledges a history of underlying diabetes, but reports that she is not been able to recently afford her insulin due to complications with insurance.  Consequently, she has recently been completely off of her insulin, while only taking her oral hypoglycemic agents.  Labs were notable for initial CBG greater than 600.  No evidence of anion gap metabolic acidosis.  Medications administered prior to transfer included the following: 10 units of subcutaneous NovoLog.   Subsequently, I accepted this patient for transfer for observation to a cardiac telemetry bed at [facility] for further work-up and management of the above .       Check [email or web address] for on-call coverage.   Nursing staff, Please call [place] number on Amion as soon as patient's arrival, so appropriate admitting provider can evaluate the pt.     Babs Bertin, DO Hospitalist

## 2023-02-17 NOTE — ED Notes (Addendum)
Report called to accepting nurse, Hinton Dyer on 6E at [facility], no further questions at this time, awaiting [facility] for transportation.

## 2023-02-17 NOTE — Care Management (Signed)
  Transition of Care [facility]) Screening Note   Patient Details  Name: Cindy [name] Date of Birth: 1966-02-17   Transition of Care [facility]) CM/SW Contact:    Bethena Roys, RN Phone Number: 02/17/2023, 2:53 PM    Transition of Care Department [facility]) has reviewed the patient and no TOC needs have been identified at this time. Patient presented for chest pain. Patient has [employer] and Case Manager asked Pharmacist for a benefits check this morning for  Plavix and Insulin. Patient has not paid her premiums- Case Manager will not be able to assist with MATCH for medication assistance. We will continue to monitor patient advancement through interdisciplinary progression rounds. If new patient transition needs arise, please place a TOC consult.

## 2023-02-17 NOTE — ED Notes (Signed)
Report given to [facility]... Prev RN gave report to the floor.Marland KitchenMarland Kitchen

## 2023-02-17 NOTE — Progress Notes (Signed)
Bilateral lower extremity venous study completed.   Preliminary results relayed to RN.  Please see CV Procedures for preliminary results.  Taya Ashbaugh, RVT  3:25 PM 02/17/23

## 2023-02-17 NOTE — ED Notes (Signed)
Blood sugar 636. Dr. Karle Starch aware.

## 2023-02-18 ENCOUNTER — Other Ambulatory Visit (HOSPITAL_COMMUNITY): Payer: Self-pay

## 2023-02-18 LAB — HEMOGLOBIN A1C
Hgb A1c MFr Bld: >15.5 % — ABNORMAL HIGH (ref 4.8–5.6)
Mean Plasma Glucose: >398 mg/dL

## 2023-02-23 ENCOUNTER — Ambulatory Visit (HOSPITAL_COMMUNITY): Payer: Commercial Managed Care - HMO | Attending: Internal Medicine

## 2023-02-23 DIAGNOSIS — R072 Precordial pain: Secondary | ICD-10-CM | POA: Insufficient documentation

## 2023-02-23 MED ORDER — TECHNETIUM TC 99M TETROFOSMIN IV KIT
31.9000 | PACK | Freq: Once | INTRAVENOUS | Status: AC | PRN
  Administered 2023-02-23: 31.9 via INTRAVENOUS

## 2023-02-23 MED ORDER — REGADENOSON 0.4 MG/5ML IV SOLN
0.4000 mg | Freq: Once | INTRAVENOUS | Status: AC
  Administered 2023-02-23: 0.4 mg via INTRAVENOUS

## 2023-02-24 ENCOUNTER — Ambulatory Visit (HOSPITAL_COMMUNITY): Payer: Commercial Managed Care - HMO

## 2023-02-24 LAB — MYOCARDIAL PERFUSION IMAGING
LV dias vol: 74 mL (ref 46–106)
LV sys vol: 26 mL
Nuc Stress EF: 65 %
Peak HR: 91 {beats}/min
Rest HR: 74 {beats}/min
Rest Nuclear Isotope Dose: 29.2 mCi
SDS: 2
SRS: 0
SSS: 2
ST Depression (mm): 0 mm
Stress Nuclear Isotope Dose: 31.9 mCi
TID: 1.43

## 2023-02-24 MED ORDER — TECHNETIUM TC 99M TETROFOSMIN IV KIT
29.2000 | PACK | Freq: Once | INTRAVENOUS | Status: AC | PRN
  Administered 2023-02-24: 29.2 via INTRAVENOUS

## 2023-02-25 ENCOUNTER — Ambulatory Visit (HOSPITAL_COMMUNITY): Payer: Commercial Managed Care - HMO

## 2023-03-02 ENCOUNTER — Ambulatory Visit: Payer: Commercial Managed Care - HMO

## 2023-03-02 ENCOUNTER — Other Ambulatory Visit (HOSPITAL_COMMUNITY): Payer: Self-pay

## 2023-03-02 VITALS — BP 123/66 | HR 76 | Temp 97.7°F | Ht 63.0 in | Wt 236.5 lb

## 2023-03-02 DIAGNOSIS — E118 Type 2 diabetes mellitus with unspecified complications: Secondary | ICD-10-CM

## 2023-03-02 DIAGNOSIS — N179 Acute kidney failure, unspecified: Secondary | ICD-10-CM

## 2023-03-02 DIAGNOSIS — Z1231 Encounter for screening mammogram for malignant neoplasm of breast: Secondary | ICD-10-CM

## 2023-03-02 DIAGNOSIS — I2089 Other forms of angina pectoris: Secondary | ICD-10-CM

## 2023-03-02 DIAGNOSIS — R609 Edema, unspecified: Secondary | ICD-10-CM

## 2023-03-02 DIAGNOSIS — Z0000 Encounter for general adult medical examination without abnormal findings: Secondary | ICD-10-CM

## 2023-03-02 DIAGNOSIS — Z794 Long term (current) use of insulin: Secondary | ICD-10-CM

## 2023-03-02 MED ORDER — FARXIGA 10 MG PO TABS
10.0000 mg | ORAL_TABLET | Freq: Every day | ORAL | 6 refills | Status: DC
  Filled 2023-03-02 – 2023-03-04 (×2): qty 30, 30d supply, fill #0
  Filled 2023-03-31: qty 30, 30d supply, fill #1
  Filled 2023-05-18: qty 90, 90d supply, fill #2
  Filled 2023-08-22: qty 60, 60d supply, fill #3

## 2023-03-02 MED ORDER — PANTOPRAZOLE SODIUM 20 MG PO TBEC
20.0000 mg | DELAYED_RELEASE_TABLET | Freq: Every day | ORAL | 0 refills | Status: DC
  Filled 2023-03-02: qty 90, 90d supply, fill #0
  Filled 2023-03-04: qty 30, 30d supply, fill #0

## 2023-03-02 MED ORDER — INSULIN GLARGINE 100 UNIT/ML SOLOSTAR PEN
40.0000 [IU] | PEN_INJECTOR | Freq: Every day | SUBCUTANEOUS | 0 refills | Status: DC
  Filled 2023-03-02 – 2023-03-14 (×2): qty 12, 30d supply, fill #0

## 2023-03-02 MED ORDER — METFORMIN HCL ER 500 MG PO TB24
500.0000 mg | ORAL_TABLET | Freq: Two times a day (BID) | ORAL | 2 refills | Status: DC
  Filled 2023-03-02 – 2023-03-04 (×2): qty 60, 30d supply, fill #0
  Filled 2023-04-30: qty 60, 30d supply, fill #1

## 2023-03-02 MED ORDER — ONETOUCH VERIO VI STRP
ORAL_STRIP | 12 refills | Status: DC
  Filled 2023-03-02: qty 50, 25d supply, fill #0

## 2023-03-02 MED ORDER — ONETOUCH DELICA PLUS LANCET30G MISC
1.0000 | Freq: Two times a day (BID) | 11 refills | Status: DC
  Filled 2023-03-02: qty 50, 25d supply, fill #0

## 2023-03-02 NOTE — Addendum Note (Signed)
Addended by: Linward Natal on: 03/02/2023 04:30 PM   Modules accepted: Orders

## 2023-03-02 NOTE — Assessment & Plan Note (Signed)
Referral sent for screening mammogram.

## 2023-03-02 NOTE — Assessment & Plan Note (Signed)
Patient recently discharged after admission for angina. Cardiac workup negative for ACS. Cardiology recommended stress testing, 2-day myoview completed on 3/28 and 3/29. -continue lipitor 80 mg daily -plavix 75 mg daily -ezitimibe 10 mg daily -outpatient cardiology follow up 4/5.

## 2023-03-02 NOTE — Progress Notes (Signed)
Asked to assist patient by Dr. Dema Severin with self monitoring her diabetes. She states her Continuous glucose monitoring Fell off often. Gave patient a One Touch Veri flex sample meter today and she demonstrated how to use it advised methods to increase adherence of he CGM sensor and provided them with [employer] number to call for replacements and overpatch samples x 5. Debera Lat, RD 03/02/2023 2:50 PM.

## 2023-03-02 NOTE — Assessment & Plan Note (Signed)
This is a chronic issue. Normal ECHO in 2021. Likely not cardiac in origin. Venous dopplers negative during recent admission. Most likely venous insufficiency. -compression socks -elevate legs when able

## 2023-03-02 NOTE — Progress Notes (Signed)
   CC: HFU  HPI:  [name] is a 57 y.o. with past medical history as below who presents for HFU. Please see detailed assessment and plan for HPI.  Past Medical History:  Diagnosis Date   CAD in native artery 07/09/2020   Coronary artery disease    Diabetes mellitus    GERD (gastroesophageal reflux disease)    Hyperlipemia    Hypertension    S/P angioplasty with stent 07/09/2020   Stroke [facility])    Review of Systems:  See detailed assessment and plan for pertinent ROS.  Physical Exam:  Vitals:   03/02/23 1359  BP: 123/66  Pulse: 76  Temp: 97.7 F (36.5 C)  TempSrc: Oral  SpO2: 100%  Weight: 236 lb 8 oz (107.3 kg)  Height: 5\' 3"  (1.6 m)   Physical Exam Constitutional:      General: She is not in acute distress. HENT:     Head: Normocephalic and atraumatic.  Cardiovascular:     Rate and Rhythm: Normal rate and regular rhythm.     Heart sounds: No murmur heard. Pulmonary:     Breath sounds: No wheezing, rhonchi or rales.  Musculoskeletal:     Right lower leg: Edema present.     Left lower leg: Edema present.  Neurological:     Mental Status: She is alert.      Assessment & Plan:   See Encounters Tab for problem based charting.  Type 2 diabetes mellitus with complication, with long-term current use of insulin ([place]) Patient presents after recent admission during which A1c was found to be 15.5 while only taking [place] due to cost of obtaining medications. Patient has been taking metformin 500 mg daily, farxiga 10 mg daily, and Lantus 40 u daily. Denies any unwanted side effects. She reports her sugars range in the low 200s (checks morning and evening). Reports that if her sugars are ever in the 100s, she feels off balance and dizzy. Foot exam performed today. -continue Lantus 40 u daily -continue Farxiga 10 mg daily -increase metformin to 1000 mg daily -urine microalb/cr ratio  Anginal chest pain at rest Patient recently discharged after admission  for angina. Cardiac workup negative for ACS. Cardiology recommended stress testing, 2-day myoview completed on 3/28 and 3/29. -continue lipitor 80 mg daily -plavix 75 mg daily -ezitimibe 10 mg daily -outpatient cardiology follow up 4/5.   Healthcare maintenance Referral sent for screening mammogram.   Dependent edema This is a chronic issue. Normal ECHO in 2021. Likely not cardiac in origin. Venous dopplers negative during recent admission. Most likely venous insufficiency. -compression socks -elevate legs when able   Patient discussed with Dr. Philipp Ovens

## 2023-03-02 NOTE — Assessment & Plan Note (Addendum)
Patient presents after recent admission during which A1c was found to be 15.5 while only taking [place] due to cost of obtaining medications. Patient has been taking metformin 500 mg daily, farxiga 10 mg daily, and Lantus 40 u daily. Denies any unwanted side effects. She reports her sugars range in the low 200s (checks morning and evening). Reports that if her sugars are ever in the 100s, she feels off balance and dizzy. Foot exam performed today. -continue Lantus 40 u daily -continue Farxiga 10 mg daily -increase metformin to 1000 mg daily -urine microalb/cr ratio

## 2023-03-02 NOTE — Patient Instructions (Signed)
Cindy [name], it was a pleasure seeing you today!  Today we discussed: Diabetes - take metformin 500 mg twice daily. Continue taking farxiga and lantus as well. Leg swelling - purchase compression socks and elevate legs when possible  You should get a call about scheduling mammogram. Please schedule colonoscopy as well.  I have ordered the following labs today:  Lab Orders         BMP8+Anion Gap         Microalbumin / Creatinine Urine Ratio      Tests ordered today:  none  Referrals ordered today:   Referral Orders  No referral(s) requested today     I have ordered the following medication/changed the following medications:   Stop the following medications: Medications Discontinued During This Encounter  Medication Reason   glucose blood (FREESTYLE LITE) test strip    FARXIGA 10 MG TABS tablet Reorder   pantoprazole (PROTONIX) 20 MG tablet Reorder   insulin glargine (LANTUS) 100 UNIT/ML Solostar Pen Reorder   metFORMIN (GLUCOPHAGE-XR) 500 MG 24 hr tablet Reorder     Start the following medications: Meds ordered this encounter  Medications   FARXIGA 10 MG TABS tablet    Sig: Take 1 tablet (10 mg total) by mouth daily.    Dispense:  30 tablet    Refill:  6    Im per dr   insulin glargine (LANTUS) 100 UNIT/ML Solostar Pen    Sig: Inject 40 Units into the skin daily.    Dispense:  12 mL    Refill:  0   pantoprazole (PROTONIX) 20 MG tablet    Sig: Take 1 tablet (20 mg total) by mouth daily.    Dispense:  90 tablet    Refill:  0    PLEASE  SCHEDULE APPOINTMENT FOR FUTURE REFILLS   metFORMIN (GLUCOPHAGE-XR) 500 MG 24 hr tablet    Sig: Take 1 tablet (500 mg total) by mouth 2 (two) times daily with a meal.    Dispense:  60 tablet    Refill:  2    Please see if patient qualifies for $5 list for 30-day supply. Her insurance is not currently active. She is an [facility] patient.     Follow-up: 3 months   Please make sure to arrive 15 minutes prior to your next  appointment. If you arrive late, you may be asked to reschedule.   We look forward to seeing you next time. Please call our clinic at [phone] if you have any questions or concerns. The best time to call is Monday-Friday from 9am-4pm, but there is someone available 24/7. If after hours or the weekend, call the main hospital number and ask for the Internal Medicine Resident On-Call. If you need medication refills, please notify your pharmacy one week in advance and they will send [place] a request.  Thank you for letting us take part in your care. Wishing you the best!  Thank you, Linward Natal, MD

## 2023-03-03 ENCOUNTER — Other Ambulatory Visit (HOSPITAL_COMMUNITY): Payer: Self-pay

## 2023-03-03 LAB — BMP8+ANION GAP
Anion Gap: 16 mmol/L (ref 10.0–18.0)
BUN/Creatinine Ratio: 19 (ref 9–23)
BUN: 15 mg/dL (ref 6–24)
CO2: 24 mmol/L (ref 20–29)
Calcium: 9 mg/dL (ref 8.7–10.2)
Chloride: 98 mmol/L (ref 96–106)
Creatinine, Ser: 0.77 mg/dL (ref 0.57–1.00)
Glucose: 231 mg/dL — ABNORMAL HIGH (ref 70–99)
Potassium: 4.2 mmol/L (ref 3.5–5.2)
Sodium: 138 mmol/L (ref 134–144)
eGFR: 89 mL/min/{1.73_m2} (ref >59)

## 2023-03-03 LAB — MICROALBUMIN / CREATININE URINE RATIO
Creatinine, Urine: 69.7 mg/dL
Microalb/Creat Ratio: 29 mg/g creat (ref 0–29)
Microalbumin, Urine: 20.3 ug/mL

## 2023-03-04 ENCOUNTER — Ambulatory Visit: Payer: Commercial Managed Care - HMO | Attending: Nurse Practitioner | Admitting: Nurse Practitioner

## 2023-03-04 ENCOUNTER — Other Ambulatory Visit (HOSPITAL_COMMUNITY): Payer: Self-pay

## 2023-03-04 ENCOUNTER — Encounter: Payer: Self-pay | Admitting: Nurse Practitioner

## 2023-03-04 VITALS — BP 126/78 | HR 87 | Ht 63.0 in | Wt 236.0 lb

## 2023-03-04 DIAGNOSIS — I25118 Atherosclerotic heart disease of native coronary artery with other forms of angina pectoris: Secondary | ICD-10-CM | POA: Diagnosis not present

## 2023-03-04 DIAGNOSIS — G4733 Obstructive sleep apnea (adult) (pediatric): Secondary | ICD-10-CM

## 2023-03-04 DIAGNOSIS — I10 Essential (primary) hypertension: Secondary | ICD-10-CM

## 2023-03-04 DIAGNOSIS — E785 Hyperlipidemia, unspecified: Secondary | ICD-10-CM | POA: Diagnosis not present

## 2023-03-04 DIAGNOSIS — R0609 Other forms of dyspnea: Secondary | ICD-10-CM

## 2023-03-04 DIAGNOSIS — E0822 Diabetes mellitus due to underlying condition with diabetic chronic kidney disease: Secondary | ICD-10-CM

## 2023-03-04 MED ORDER — PEN NEEDLES 32G X 4 MM MISC
1.0000 | Freq: Two times a day (BID) | 0 refills | Status: AC
  Filled 2023-03-04: qty 100, 50d supply, fill #0

## 2023-03-04 MED ORDER — TRUEPLUS LANCETS 28G MISC
1.0000 | Freq: Two times a day (BID) | 0 refills | Status: AC
  Filled 2023-03-04 (×2): qty 100, 50d supply, fill #0

## 2023-03-04 MED ORDER — TRUE METRIX METER W/DEVICE KIT
1.0000 | PACK | Freq: Two times a day (BID) | 0 refills | Status: AC
  Filled 2023-03-04: qty 1, 1d supply, fill #0
  Filled 2023-03-04: qty 1, 30d supply, fill #0

## 2023-03-04 MED ORDER — GLUCOSE BLOOD VI STRP
ORAL_STRIP | 12 refills | Status: AC
  Filled 2023-03-04 (×2): qty 50, 25d supply, fill #0

## 2023-03-04 MED ORDER — ISOSORBIDE MONONITRATE ER 30 MG PO TB24
30.0000 mg | ORAL_TABLET | Freq: Every day | ORAL | 3 refills | Status: DC
  Filled 2023-03-04: qty 30, 30d supply, fill #0

## 2023-03-04 NOTE — Addendum Note (Signed)
Addended by: Adron Bene on: 03/04/2023 10:11 AM   Modules accepted: Orders

## 2023-03-04 NOTE — Patient Instructions (Signed)
Medication Instructions:  Increase Imdur 30 mg daily  *If you need a refill on your cardiac medications before your next appointment, please call your pharmacy*   Lab Work: NONE ordered at this time of appointment   If you have labs (blood work) drawn today and your tests are completely normal, you will receive your results only by: MyChart Message (if you have MyChart) OR A paper copy in the mail If you have any lab test that is abnormal or we need to change your treatment, we will call you to review the results.   Testing/Procedures: How to Prepare for Your Cardiac PET/CT Stress Test:  1. Please do not take these medications before your test:   Medications that may interfere with the cardiac pharmacological stress agent (ex. nitrates - including erectile dysfunction medications, isosorbide mononitrate, tamulosin or beta-blockers) the day of the exam. (Erectile dysfunction medication should be held for at least 72 hrs prior to test) Theophylline containing medications for 12 hours. Dipyridamole 48 hours prior to the test. Your remaining medications may be taken with water.  2. Nothing to eat or drink, except water, 3 hours prior to arrival time.   NO caffeine/decaffeinated products, or chocolate 12 hours prior to arrival.  3. NO perfume, cologne or lotion  4. Total time is 1 to 2 hours; you may want to bring reading material for the waiting time.  5. Please [facility] Rad[facility] Main [facility]tr[facility] 30 minutes early for your test.  [address]  [place], [place] [id number]  Diabetic Preparation:  Hold oral medications. You may take NPH and Lantus insulin. Do not take Humalog or Humulin R (Regular Insulin) the day of your test. Check blood sugars prior to leaving the house. If able to eat breakfast prior to 3 hour fasting, you may take all medications, including your insulin, Do not worry if you miss your breakfast dose of insulin - start at your next  meal.  IF YOU THINK YOU MAY BE PREGNANT, OR ARE NURSING PLEASE INFORM THE TECHNOLOGIST.  In preparation for your appointment, medication and supplies will be purchased.  Appointment availability is limited, so if you need to cancel or reschedule, please call the Radiology Department at [phone]  24 hours in advance to avoid a cancellation fee of $100.00  What to Expect After you Arrive:  Once you arrive and check in for your appointment, you will be taken to a preparation room within the Radiology Department.  A technologist or Nurse will obtain your medical history, verify that you are correctly prepped for the exam, and explain the procedure.  Afterwards,  an IV will be started in your arm and electrodes will be placed on your skin for EKG monitoring during the stress portion of the exam. Then you will be escorted to the PET/CT scanner.  There, staff will get you positioned on the scanner and obtain a blood pressure and EKG.  During the exam, you will continue to be connected to the EKG and blood pressure machines.  A small, safe amount of a radioactive tracer will be injected in your IV to obtain a series of pictures of your heart along with an injection of a stress agent.    After your Exam:  It is recommended that you eat a meal and drink a caffeinated beverage to counter act any effects of the stress agent.  Drink plenty of fluids for the remainder of the day and urinate frequently for the first couple  of hours after the exam.  Your doctor will inform you of your test results within 7-10 business days.  For questions about your test or how to prepare for your test, please call: Rockwell Alexandria, Cardiac Imaging Nurse Navigator  Larey Brick, Cardiac Imaging Nurse Navigator Office: [phone]     Follow-Up: At [facility], you and your health needs are our priority.  As part of our continuing mission to provide you with exceptional heart care, we have created designated  Provider Care Teams.  These Care Teams include your primary Cardiologist (physician) and Advanced Practice Providers (APPs -  Physician Assistants and Nurse Practitioners) who all work together to provide you with the care you need, when you need it.  We recommend signing up for the patient portal called "MyChart".  Sign up information is provided on this After Visit Summary.  MyChart is used to connect with patients for Virtual Visits (Telemedicine).  Patients are able to view lab/test results, encounter notes, upcoming appointments, etc.  Non-urgent messages can be sent to your provider as well.   To learn more about what you can do with MyChart, go to [email or web address].    Your next appointment:   6-8 week(s)  Provider:   Bernadene Person, NP        Other Instructions

## 2023-03-04 NOTE — Progress Notes (Unsigned)
Office Visit    Patient Name: Cindy [name] Date of Encounter: 03/04/2023  Primary Care Provider:  Olegario MessierNooruddin, Saad, MD Primary Cardiologist:  Parke PoissonGayatri A Acharya, MD  Chief Complaint    57 year old female with a history of CAD s/p DES x 2-LAD in 2021, hypertension, hyperlipidemia, type 2 diabetes, OSA, and GERD who presents for follow-up related to CAD.   Past Medical History    Past Medical History:  Diagnosis Date   CAD in native artery 07/09/2020   Coronary artery disease    Diabetes mellitus    GERD (gastroesophageal reflux disease)    Hyperlipemia    Hypertension    S/P angioplasty with stent 07/09/2020   Stroke    Past Surgical History:  Procedure Laterality Date   CARDIAC CATHETERIZATION     CORONARY STENT INTERVENTION N/A 07/08/2020   Procedure: CORONARY STENT INTERVENTION;  Surgeon: Corky CraftsVaranasi, Jayadeep S, MD;  Location: [facility];  Service: Cardiovascular;  Laterality: N/A;   CORONARY ULTRASOUND/IVUS N/A 07/08/2020   Procedure: Intravascular Ultrasound/IVUS;  Surgeon: Corky CraftsVaranasi, Jayadeep S, MD;  [facility];  Service: Cardiovascular;  Laterality: N/A;   LEFT HEART CATH AND CORONARY ANGIOGRAPHY N/A 07/07/2020   Procedure: LEFT HEART CATH AND CORONARY ANGIOGRAPHY;  Surgeon: Runell GessBerry, Jonathan J, MD;  Location: [facility];  Service: Cardiovascular;  Laterality: N/A;   None      Allergies  Allergies  Allergen Reactions   Other     Hives to an unknown medication [facility]      Labs/Other Studies Reviewed    The following studies were reviewed today: LHC 07/08/2020: Dist LAD lesion is 80% stenosed. A drug-eluting stent was successfully placed using a STENT RESOLUTE ONYX 2.5X18, postdilated with a 3.0 Chloride balloon and optimized wth IVUS. Post intervention, there is a 0% residual stenosis. Mid LAD lesion is 80% stenosed. Minimal calcification by IVUS so atherectomy was not required. A drug-eluting stent was successfully  placed using a STENT RESOLUTE ONYX 3.5X22, postdilated with a 4.0 Erick balloon and optimized wth IVUS. Post intervention, there is a 0% residual stenosis.   Continue aggressive secondary prevention.  I stressed the importance of DAPT.     Echo 06/2020: IMPRESSIONS    1. Left ventricular ejection fraction, by estimation, is 60 to 65%. The  left ventricle has normal function. The left ventricle has no regional  wall motion abnormalities. There is mild concentric left ventricular  hypertrophy. Left ventricular diastolic  parameters were normal.   2. Right ventricular systolic function is normal. The right ventricular  size is normal. Tricuspid regurgitation signal is inadequate for assessing  PA pressure.   3. The mitral valve is grossly normal. Trivial mitral valve  regurgitation. No evidence of mitral stenosis.   4. The aortic valve is tricuspid. Aortic valve regurgitation is not  visualized. No aortic stenosis is present.   5. The inferior vena cava is normal in size with greater than 50%  respiratory variability, suggesting right atrial pressure of 3 mmHg.   Recent Labs: 12/28/2022: ALT 13 02/17/2023: B Natriuretic Peptide 10.7; Hemoglobin 10.9; Platelets 382 03/02/2023: BUN 15; Creatinine, Ser 0.77; Potassium 4.2; Sodium 138  Recent Lipid Panel    Component Value Date/Time   CHOL 129 12/28/2022 1130   TRIG 113 12/28/2022 1130   HDL 43 12/28/2022 1130   CHOLHDL 3.0 12/28/2022 1130   CHOLHDL 6.1 07/07/2020 0118   VLDL 30 07/07/2020 0118   LDLCALC 65 12/28/2022  1130    History of Present Illness   57 year old female with the above past medical history including CAD s/p DES x 2-LAD in 2021, hypertension, hyperlipidemia, type 2 diabetes, OSA, and GERD.   Echocardiogram in 06/2020 showed EF 60 to 65%, normal LV function, no RWMA, mild concentric LVH, normal RV systolic function, no significant valvular abnormalities.  At her follow-up visit in 08/2022 she noted rare intermittent chest  discomfort.  She was started on low-dose Imdur.  DAPT was transitioned to Plavix monotherapy.  She was last seen in the office on 12/28/2022 and was stable from a cardiac standpoint.  She denies symptoms concerning for angina.  Unfortunately, she presented to the ED on 02/17/2023 with chest pain.  Troponin was flat.  Cardiology was consulted.  She underwent outpatient [facility] which suggested normal perfusion however, there was evidence of transient ischemic dilation.  It was noted that should she have recurrent chest pain, she would likely benefit from further ischemic evaluation with cardiac PET stress test or cardiac catheterization.   She presents today for follow-up accompanied by her daughter.  Since her last visit and since her recent ED visit     She has been stable overall from a cardiac standpoint.  She denies any recurrent chest pain, however, she does continue to note significant dyspnea on exertion.  She notes that prior to her stents in 2021 she did have dyspnea on exertion as well.  We discussed options for further ischemic evaluation, and through the surge decision making, patient prefers to proceed with cardiac PET stress test prior to pursuing cardiac catheterization.  Will order cardiac PET stress test.  Will increase Imdur to 30 mg daily.  Follow-up in 6 to 8 weeks. 1. CAD: S/p DES x 2-LAD in 2021.  Stable with no anginal symptoms.  Continue Plavix, hydrochlorothiazide, lisinopril, metoprolol, Imdur, Lipitor, and Zetia.  Shared Decision Making/Informed Consent{ All outpatient stress tests require an informed consent ([id number]) ATTESTATION ORDER       :[id number]} The risks [chest pain, shortness of breath, cardiac arrhythmias, dizziness, blood pressure fluctuations, myocardial infarction, stroke/transient ischemic attack, nausea, vomiting, allergic reaction, radiation exposure, metallic taste sensation and life-threatening complications (estimated to be 1 in 10,000)], benefits  (risk stratification, diagnosing coronary artery disease, treatment guidance) and alternatives of a cardiac PET stress test were discussed in detail with Cindy [name] and she agrees to proceed.  2. Hypertension: BP well controlled. Continue current antihypertensive regimen.    3. Hyperlipidemia: LDL was 65 in 11/2022.  Continue Lipitor, Zetia.    4. Type 2 diabetes: A1c was > 14.0 in 05/2022.  She does have some peripheral neuropathy.  She is following with endocrinology.   5. OSA: Adherent to CPAP.  Denies any concerns.   6. Disposition: Follow-up in Home Medications    Current Outpatient Medications  Medication Sig Dispense Refill   acetaminophen (TYLENOL) 325 MG tablet Take 2 tablets (650 mg total) by mouth every 4 (four) hours as needed for headache or mild pain.     atorvastatin (LIPITOR) 80 MG tablet Take 1 tablet (80 mg total) by mouth daily. 90 tablet 3   Blood Glucose Monitoring Suppl (TRUE METRIX METER) w/Device KIT Use to test blood sugar 2 (two) times daily. 1 kit 0   clopidogrel (PLAVIX) 75 MG tablet Take 1 tablet (75 mg total) by mouth daily. 30 tablet 11   Continuous Blood Gluc Sensor (FREESTYLE LIBRE 3 SENSOR) MISC Place 1 sensor on the skin every 14 days. Use  to check glucose continuously 2 each 2   ezetimibe (ZETIA) 10 MG tablet Take 1 tablet (10 mg total) by mouth daily. 90 tablet 3   FARXIGA 10 MG TABS tablet Take 1 tablet (10 mg total) by mouth daily. 30 tablet 6   glucose blood test strip Use as instructed to test blood sugar 2 times daily 100 each 12   hydrochlorothiazide (HYDRODIURIL) 25 MG tablet Take 0.5 tablet by mouth once daily 45 tablet 3   insulin glargine (LANTUS) 100 UNIT/ML Solostar Pen Inject 40 Units into the skin daily. 12 mL 0   Insulin Pen Needle (PEN NEEDLES) 32G X 4 MM MISC Use as directed 100 each 0   Insulin Syringes, Disposable, U-100 0.5 ML MISC 30 Units by Does not apply route 2 (two) times daily with a meal. 100 each 3   lisinopril (ZESTRIL) 40 MG  tablet Take 1 tablet (40 mg total) by mouth daily. 90 tablet 3   metFORMIN (GLUCOPHAGE-XR) 500 MG 24 hr tablet Take 1 tablet (500 mg total) by mouth 2 (two) times daily with a meal. 60 tablet 2   metoprolol succinate (TOPROL-XL) 25 MG 24 hr tablet Take 1 tablet (25 mg total) by mouth daily. 90 tablet 3   nitroGLYCERIN (NITROSTAT) 0.4 MG SL tablet Place 1 tablet (0.4 mg total) under the tongue every 5 (five) minutes as needed for chest pain. 25 tablet 4   pantoprazole (PROTONIX) 20 MG tablet Take 1 tablet (20 mg total) by mouth daily. *Need appointment for future refills* 90 tablet 0   sertraline (ZOLOFT) 50 MG tablet Take 1 tablet (50 mg total) by mouth at bedtime. 90 tablet 0   TRUEplus Lancets 28G MISC Use to test blood sugar 2 (two) times daily. 100 each 0   isosorbide mononitrate (IMDUR) 30 MG 24 hr tablet Take 1 tablet (30 mg total) by mouth daily. 90 tablet 3   No current facility-administered medications for this visit.     Review of Systems     .  All other systems reviewed and are otherwise negative except as noted above.    Physical Exam    VS:  BP 126/78 (BP Location: Left Arm, Patient Position: Sitting, Cuff Size: Normal)   Pulse 87   Ht 5\' 3"  (1.6 m)   Wt 236 lb (107 kg)   BMI 41.81 kg/m   GEN: Well nourished, well developed, in no acute distress. HEENT: normal. Neck: Supple, no JVD, carotid bruits, or masses. Cardiac: RRR, no murmurs, rubs, or gallops. No clubbing, cyanosis, edema.  Radials/DP/PT 2+ and equal bilaterally.  Respiratory:  Respirations regular and unlabored, clear to auscultation bilaterally. GI: Soft, nontender, nondistended, BS + x 4. MS: no deformity or atrophy. Skin: warm and dry, no rash. Neuro:  Strength and sensation are intact. Psych: Normal affect.  Accessory Clinical Findings    ECG personally reviewed by me today -NSR, 87 bpm, RBBB- no acute changes.   Lab Results  Component Value Date   WBC 8.4 02/17/2023   HGB 10.9 (L) 02/17/2023    HCT 32.9 (L) 02/17/2023   MCV 93.5 02/17/2023   PLT 382 02/17/2023   Lab Results  Component Value Date   CREATININE 0.77 03/02/2023   BUN 15 03/02/2023   NA 138 03/02/2023   K 4.2 03/02/2023   CL 98 03/02/2023   CO2 24 03/02/2023   Lab Results  Component Value Date   ALT 13 12/28/2022   AST 10 12/28/2022   ALKPHOS 123 (H)  12/28/2022   BILITOT <0.2 12/28/2022   Lab Results  Component Value Date   CHOL 129 12/28/2022   HDL 43 12/28/2022   LDLCALC 65 12/28/2022   TRIG 113 12/28/2022   CHOLHDL 3.0 12/28/2022    Lab Results  Component Value Date   HGBA1C >15.5 (H) 02/17/2023    Assessment & Plan    1.  ***      Joylene Grapes, NP 03/04/2023, 1:18 PM

## 2023-03-06 ENCOUNTER — Other Ambulatory Visit: Payer: Self-pay | Admitting: Internal Medicine

## 2023-03-06 ENCOUNTER — Encounter: Payer: Self-pay | Admitting: Nurse Practitioner

## 2023-03-07 NOTE — Progress Notes (Signed)
Internal Medicine Clinic Attending  Case discussed with Dr. White  At the time of the visit.  We reviewed the resident's history and exam and pertinent patient test results.  I agree with the assessment, diagnosis, and plan of care documented in the resident's note.  

## 2023-03-10 ENCOUNTER — Ambulatory Visit
Admission: RE | Admit: 2023-03-10 | Discharge: 2023-03-10 | Disposition: A | Discharge status: Home / Self Care | Payer: Commercial Managed Care - HMO | Source: Ambulatory Visit | Attending: Student in an Organized Health Care Education/Training Program | Admitting: Student in an Organized Health Care Education/Training Program

## 2023-03-10 DIAGNOSIS — Z1231 Encounter for screening mammogram for malignant neoplasm of breast: Secondary | ICD-10-CM

## 2023-03-14 ENCOUNTER — Other Ambulatory Visit (HOSPITAL_COMMUNITY): Payer: Self-pay

## 2023-03-14 ENCOUNTER — Other Ambulatory Visit: Payer: Self-pay

## 2023-03-21 ENCOUNTER — Other Ambulatory Visit: Payer: Self-pay | Admitting: *Deleted

## 2023-03-21 MED ORDER — PANTOPRAZOLE SODIUM 20 MG PO TBEC: 20.0000 mg | DELAYED_RELEASE_TABLET | Freq: Every day | ORAL | 2 refills | Status: DC

## 2023-03-22 ENCOUNTER — Other Ambulatory Visit: Payer: Self-pay

## 2023-03-22 MED ORDER — PANTOPRAZOLE SODIUM 20 MG PO TBEC: 20.0000 mg | DELAYED_RELEASE_TABLET | Freq: Every day | ORAL | 2 refills | Status: DC

## 2023-03-31 ENCOUNTER — Other Ambulatory Visit (HOSPITAL_COMMUNITY): Payer: Self-pay

## 2023-04-01 ENCOUNTER — Other Ambulatory Visit (HOSPITAL_COMMUNITY): Payer: Self-pay

## 2023-04-18 ENCOUNTER — Telehealth (HOSPITAL_COMMUNITY): Payer: Self-pay | Admitting: *Deleted

## 2023-04-18 NOTE — Telephone Encounter (Signed)
Attempted to call patient regarding upcoming cardiac  PET appointment. Left message on voicemail with name and callback number  Larey Brick RN Navigator Cardiac Imaging [facility] [phone] Office [phone] Cell  Reminder to withhold Imdur the day before and caffeine 12 hours prior to her appointment.

## 2023-04-18 NOTE — Telephone Encounter (Signed)

## 2023-04-20 ENCOUNTER — Encounter (HOSPITAL_COMMUNITY)
Admission: RE | Admit: 2023-04-20 | Discharge: 2023-04-20 | Disposition: A | Discharge status: Home / Self Care | Payer: Commercial Managed Care - HMO | Source: Ambulatory Visit | Attending: Nurse Practitioner | Admitting: Nurse Practitioner

## 2023-04-20 DIAGNOSIS — R0609 Other forms of dyspnea: Secondary | ICD-10-CM | POA: Diagnosis present

## 2023-04-20 DIAGNOSIS — I25118 Atherosclerotic heart disease of native coronary artery with other forms of angina pectoris: Secondary | ICD-10-CM | POA: Diagnosis present

## 2023-04-20 LAB — NM PET CT CARDIAC PERFUSION MULTI W/ABSOLUTE BLOODFLOW
LV dias vol: 99 mL (ref 46–106)
MBFR: 1.25
Nuc Rest EF: 72 %
Nuc Stress EF: 72 %
Peak HR: 90 {beats}/min
Rest HR: 74 {beats}/min
Rest MBF: 1.25 ml/g/min
Rest Nuclear Isotope Dose: 27.8 mCi
Rest perfusion cavity size (mL): 99 mL
ST Depression (mm): 0 mm
Stress MBF: 1.56 ml/g/min
Stress Nuclear Isotope Dose: 27.8 mCi
Stress perfusion cavity size (mL): 107 mL
TID: 1.06

## 2023-04-20 MED ORDER — REGADENOSON 0.4 MG/5ML IV SOLN
INTRAVENOUS | Status: AC
  Filled 2023-04-20: qty 5

## 2023-04-20 MED ORDER — RUBIDIUM RB82 GENERATOR (RUBYFILL)
27.8000 | PACK | Freq: Once | INTRAVENOUS | Status: AC
  Administered 2023-04-20: 27.8 via INTRAVENOUS

## 2023-04-20 MED ORDER — REGADENOSON 0.4 MG/5ML IV SOLN
0.4000 mg | Freq: Once | INTRAVENOUS | Status: AC
  Administered 2023-04-20: 0.4 mg via INTRAVENOUS

## 2023-04-21 ENCOUNTER — Other Ambulatory Visit: Payer: Self-pay

## 2023-04-21 DIAGNOSIS — E118 Type 2 diabetes mellitus with unspecified complications: Secondary | ICD-10-CM

## 2023-04-21 MED ORDER — BASAGLAR KWIKPEN 100 UNIT/ML ~~LOC~~ SOPN: 40.0000 [IU] | PEN_INJECTOR | Freq: Every day | SUBCUTANEOUS | 3 refills | Status: DC

## 2023-04-27 ENCOUNTER — Telehealth: Payer: Self-pay

## 2023-04-27 LAB — HM DIABETES EYE EXAM

## 2023-04-27 NOTE — Telephone Encounter (Signed)
Lmom to discuss Cardiac PET test results. Pt has an appointment tomorrow 04/28/23 and can discuss results further at appointment.

## 2023-04-28 ENCOUNTER — Other Ambulatory Visit: Payer: Self-pay | Admitting: Student

## 2023-04-28 ENCOUNTER — Encounter: Payer: Self-pay | Admitting: Nurse Practitioner

## 2023-04-28 ENCOUNTER — Ambulatory Visit: Payer: Commercial Managed Care - HMO | Attending: Nurse Practitioner | Admitting: Nurse Practitioner

## 2023-04-28 ENCOUNTER — Other Ambulatory Visit (HOSPITAL_COMMUNITY): Payer: Self-pay

## 2023-04-28 VITALS — BP 110/64 | HR 75 | Ht 63.0 in | Wt 234.0 lb

## 2023-04-28 DIAGNOSIS — I25118 Atherosclerotic heart disease of native coronary artery with other forms of angina pectoris: Secondary | ICD-10-CM

## 2023-04-28 DIAGNOSIS — G4733 Obstructive sleep apnea (adult) (pediatric): Secondary | ICD-10-CM

## 2023-04-28 DIAGNOSIS — E785 Hyperlipidemia, unspecified: Secondary | ICD-10-CM

## 2023-04-28 DIAGNOSIS — I10 Essential (primary) hypertension: Secondary | ICD-10-CM

## 2023-04-28 DIAGNOSIS — Z794 Long term (current) use of insulin: Secondary | ICD-10-CM

## 2023-04-28 DIAGNOSIS — R0609 Other forms of dyspnea: Secondary | ICD-10-CM | POA: Diagnosis not present

## 2023-04-28 DIAGNOSIS — E0822 Diabetes mellitus due to underlying condition with diabetic chronic kidney disease: Secondary | ICD-10-CM

## 2023-04-28 DIAGNOSIS — Z7984 Long term (current) use of oral hypoglycemic drugs: Secondary | ICD-10-CM

## 2023-04-28 LAB — CBC

## 2023-04-28 LAB — BASIC METABOLIC PANEL
BUN: 23 mg/dL (ref 6–24)
CO2: 23 mmol/L (ref 20–29)
Chloride: 100 mmol/L (ref 96–106)
Creatinine, Ser: 0.86 mg/dL (ref 0.57–1.00)
Sodium: 138 mmol/L (ref 134–144)

## 2023-04-28 MED ORDER — LANTUS SOLOSTAR 100 UNIT/ML ~~LOC~~ SOPN
40.0000 [IU] | PEN_INJECTOR | Freq: Every day | SUBCUTANEOUS | 0 refills | Status: DC
Start: 2023-04-28 — End: 2023-06-02
  Filled 2023-04-28: qty 12, 30d supply, fill #0

## 2023-04-28 MED ORDER — ISOSORBIDE MONONITRATE ER 60 MG PO TB24
60.0000 mg | ORAL_TABLET | Freq: Every day | ORAL | 3 refills | Status: DC
  Filled 2023-04-28: qty 90, 90d supply, fill #0
  Filled 2023-08-22: qty 90, 90d supply, fill #1

## 2023-04-28 MED ORDER — LISINOPRIL 40 MG PO TABS
40.0000 mg | ORAL_TABLET | Freq: Every day | ORAL | 3 refills | Status: DC
Start: 2023-04-28 — End: 2024-03-19
  Filled 2023-04-28 – 2023-05-03 (×2): qty 90, 90d supply, fill #0
  Filled 2023-08-22: qty 90, 90d supply, fill #1

## 2023-04-28 MED ORDER — SODIUM CHLORIDE 0.9% FLUSH
3.0000 mL | Freq: Two times a day (BID) | INTRAVENOUS | Status: DC
Start: 2023-04-28 — End: 2023-04-28

## 2023-04-28 MED ORDER — HYDROCHLOROTHIAZIDE 25 MG PO TABS
12.5000 mg | ORAL_TABLET | Freq: Every day | ORAL | 3 refills | Status: DC
  Filled 2023-04-28 – 2023-05-03 (×2): qty 45, 90d supply, fill #0
  Filled 2023-08-22: qty 45, 90d supply, fill #1

## 2023-04-28 NOTE — Patient Instructions (Signed)
Medication Instructions:  Increase Imdur 60 mg daily   *If you need a refill on your cardiac medications before your next appointment, please call your pharmacy*   Lab Work: CBC & BMET today  Testing/Procedures: Your physician has requested that you have a cardiac catheterization. Cardiac catheterization is used to diagnose and/or treat various heart conditions. Doctors may recommend this procedure for a number of different reasons. The most common reason is to evaluate chest pain. Chest pain can be a symptom of coronary artery disease (CAD), and cardiac catheterization can show whether plaque is narrowing or blocking your heart's arteries. This procedure is also used to evaluate the valves, as well as measure the blood flow and oxygen levels in different parts of your heart. For further information please visit [email or web address] Please follow instruction sheet, as given.    Follow-Up: At [facility], you and your health needs are our priority.  As part of our continuing mission to provide you with exceptional heart care, we have created designated Provider Care Teams.  These Care Teams include your primary Cardiologist (physician) and Advanced Practice Providers (APPs -  Physician Assistants and Nurse Practitioners) who all work together to provide you with the care you need, when you need it.  We recommend signing up for the patient portal called "MyChart".  Sign up information is provided on this After Visit Summary.  MyChart is used to connect with patients for Virtual Visits (Telemedicine).  Patients are able to view lab/test results, encounter notes, upcoming appointments, etc.  Non-urgent messages can be sent to your provider as well.   To learn more about what you can do with MyChart, go to [email or web address].    Your next appointment:    2 weeks post cath   Provider:   Bernadene Person, NP        Other Instructions  [facility] A DEPT OF [place]. [facility] AT [facility] [address] [place] [id number] [place] [address] Dept: [phone] Loc: [phone]  Levinia Harrell  04/28/2023  You are scheduled for a Cardiac Catheterization on Tuesday, June 4 with Dr. Peter [place].  1. Please arrive at the [facility] (Main Entrance A) at [facility]: [address] [place], [place] [address] at 5:30 AM (This time is 2 hour(s) before your procedure to ensure your preparation). Free valet parking service is available. You will check in at ADMITTING. The support person will be asked to wait in the waiting room.  It is OK to have someone drop you off and come back when you are ready to be discharged.    Special note: Every effort is made to have your procedure done on time. Please understand that emergencies sometimes delay scheduled procedures.  2. Diet: Do not eat solid foods after midnight.  The patient may have clear liquids until 5am upon the day of the procedure.  3. Labs:Labs completed today 04/28/23  4. Medication instructions in preparation for your procedure:   Contrast Allergy: No   Current Outpatient Medications (Endocrine & Metabolic):    FARXIGA 10 MG TABS tablet, Take 1 tablet (10 mg total) by mouth daily.   Insulin Glargine (BASAGLAR KWIKPEN) 100 UNIT/ML, Inject 40 Units into the skin daily.   insulin glargine (LANTUS) 100 UNIT/ML Solostar Pen, Inject 40 Units into the skin daily.   metFORMIN (GLUCOPHAGE-XR) 500 MG 24 hr tablet, Take 1 tablet (500 mg total) by mouth 2 (two) times daily with a  meal.  Current Outpatient Medications (Cardiovascular):    atorvastatin (LIPITOR) 80 MG tablet, Take 1 tablet (80 mg total) by mouth daily.   ezetimibe (ZETIA) 10 MG tablet, Take 1 tablet (10 mg total) by mouth daily.   hydrochlorothiazide (HYDRODIURIL) 25 MG tablet, Take 0.5 tablet by mouth once daily   isosorbide mononitrate (IMDUR) 30 MG 24 hr tablet, Take 1 tablet (30  mg total) by mouth daily.   lisinopril (ZESTRIL) 40 MG tablet, Take 1 tablet (40 mg total) by mouth daily.   metoprolol succinate (TOPROL-XL) 25 MG 24 hr tablet, Take 1 tablet (25 mg total) by mouth daily.   nitroGLYCERIN (NITROSTAT) 0.4 MG SL tablet, Place 1 tablet (0.4 mg total) under the tongue every 5 (five) minutes as needed for chest pain.   Current Outpatient Medications (Analgesics):    acetaminophen (TYLENOL) 325 MG tablet, Take 2 tablets (650 mg total) by mouth every 4 (four) hours as needed for headache or mild pain.  Current Outpatient Medications (Hematological):    clopidogrel (PLAVIX) 75 MG tablet, Take 1 tablet (75 mg total) by mouth daily.  Current Outpatient Medications (Other):    Blood Glucose Monitoring Suppl (TRUE METRIX METER) w/Device KIT, Use to test blood sugar 2 (two) times daily.   Continuous Blood Gluc Sensor (FREESTYLE LIBRE 3 SENSOR) MISC, Place 1 sensor on the skin every 14 days. Use to check glucose continuously   glucose blood test strip, Use as instructed to test blood sugar 2 times daily   Insulin Pen Needle (PEN NEEDLES) 32G X 4 MM MISC, Use as directed   Insulin Syringes, Disposable, U-100 0.5 ML MISC, 30 Units by Does not apply route 2 (two) times daily with a meal.   pantoprazole (PROTONIX) 20 MG tablet, Take 1 tablet (20 mg total) by mouth daily.   sertraline (ZOLOFT) 50 MG tablet, Take 1 tablet (50 mg total) by mouth at bedtime.   TRUEplus Lancets 28G MISC, Use to test blood sugar 2 (two) times daily. *For reference purposes while preparing patient instructions.   Delete this med list prior to printing instructions for patient.*  Hold Metformin 24 hours prior to procedure.   Hold Hydrochlorathiazide the morning of procedure.  Take 1/2 dose of Lantus the night before procedure.   On the morning of your procedure, take your Plavix/Clopidogrel and any morning medicines NOT listed above.  You may use sips of water.  5. Plan to go home the same  day, you will only stay overnight if medically necessary. 6. Bring a current list of your medications and current insurance cards. 7. You MUST have a responsible person to drive you home. 8. Someone MUST be with you the first 24 hours after you arrive home or your discharge will be delayed. 9. Please wear clothes that are easy to get on and off and wear slip-on shoes.  Thank you for allowing [place] to care for you!   -- [place] [facility]

## 2023-04-28 NOTE — H&P (View-Only) (Signed)
 Office Visit    Patient Name: Cindy [name] Date of Encounter: 04/28/2023  Primary Care Provider:  Nooruddin, Saad, MD Primary Cardiologist:  Gayatri A Acharya, MD  Chief Complaint    56-year-old female with a history of CAD s/p DES x 2-LAD in 2021, hypertension, hyperlipidemia, type 2 diabetes, OSA, and GERD who presents for follow-up related to CAD.   Past Medical History    Past Medical History:  Diagnosis Date   CAD in native artery 07/09/2020   Coronary artery disease    Diabetes mellitus    GERD (gastroesophageal reflux disease)    Hyperlipemia    Hypertension    S/P angioplasty with stent 07/09/2020   Stroke (HCC)    Past Surgical History:  Procedure Laterality Date   CARDIAC CATHETERIZATION     CORONARY STENT INTERVENTION N/A 07/08/2020   Procedure: CORONARY STENT INTERVENTION;  Surgeon: Varanasi, Jayadeep S, MD;  Location: [facility];  Service: Cardiovascular;  Laterality: N/A;   CORONARY ULTRASOUND/IVUS N/A 07/08/2020   Procedure: Intravascular Ultrasound/IVUS;  Surgeon: Varanasi, Jayadeep S, MD;  Location: [facility];  Service: Cardiovascular;  Laterality: N/A;   LEFT HEART CATH AND CORONARY ANGIOGRAPHY N/A 07/07/2020   Procedure: LEFT HEART CATH AND CORONARY ANGIOGRAPHY;  Surgeon: Berry, Jonathan J, MD;  Location: [facility];  Service: Cardiovascular;  Laterality: N/A;   None      Allergies  Allergies  Allergen Reactions   Other     Hives to an unknown medication given in 2012 at [facility]      Labs/Other Studies Reviewed    The following studies were reviewed today:  Cardiac Studies & Procedures   CARDIAC CATHETERIZATION  CARDIAC CATHETERIZATION 07/08/2020  Narrative  Dist LAD lesion is 80% stenosed.  A drug-eluting stent was successfully placed using a STENT RESOLUTE ONYX 2.5X18, postdilated with a 3.0 [place] balloon and optimized wth IVUS.  Post intervention, there is a 0% residual stenosis.  Mid LAD lesion is 80%  stenosed. Minimal calcification by IVUS so atherectomy was not required.  A drug-eluting stent was successfully placed using a STENT RESOLUTE ONYX 3.5X22, postdilated with a 4.0 [place] balloon and optimized wth IVUS.  Post intervention, there is a 0% residual stenosis.  Continue aggressive secondary prevention.  I stressed the importance of DAPT.  Findings Coronary Findings Diagnostic  Dominance: Co-dominant  Left Anterior Descending Mid LAD lesion is 80% stenosed. The lesion is calcified. Ultrasound (IVUS) was performed. Severe plaque burden was detected. The lesion has a fibro-fatty core and a necrotic core. not much calcium noted by IVUS. Dist LAD lesion is 80% stenosed.  Intervention  Mid LAD lesion Stent CATH LAUNCHER 6FR EBU 3.75 guide catheter was inserted. Lesion crossed with guidewire using a WIRE ASAHI PROWATER 180CM. Pre-stent angioplasty was performed using a BALLOON SAPPHIRE 2.5X15. A drug-eluting stent was successfully placed using a STENT RESOLUTE ONYX 3.5X22. Stent strut is well apposed. Post-stent angioplasty was performed using a BALLOON SAPPHIRE [place] 4.0X12. IVUS was used to optimize the stent size, and performed before and after stenting with 5 Fr Opticross catheter.  Initially, there was plan for atherecetomy but given that there was not much calcium, we performed balloon and stenting. Post-Intervention Lesion Assessment The intervention was successful. Pre-interventional TIMI flow is 3. Post-intervention TIMI flow is 3. No complications occurred at this lesion. There is a 0% residual stenosis post intervention.  Dist LAD lesion Stent CATH LAUNCHER 6FR EBU 3.75 guide catheter was inserted. Lesion crossed   with guidewire using a WIRE ASAHI PROWATER 180CM. Pre-stent angioplasty was performed using a BALLOON SAPPHIRE 2.5X15. A drug-eluting stent was successfully placed using a STENT RESOLUTE ONYX 2.5X18. Stent strut is well apposed. Post-stent angioplasty was performed using a  BALLOON SAPPHIRE [place] 3.0X12. IVUS did not cross initially.  IVUS was done after stent placement due to haziness at distal edge of stent, but there was no evidence of edge dissection. Post-Intervention Lesion Assessment The intervention was successful. Pre-interventional TIMI flow is 3. Post-intervention TIMI flow is 3. No complications occurred at this lesion. There is a 0% residual stenosis post intervention.   CARDIAC CATHETERIZATION 07/07/2020  Narrative Images from the original result were not included.   Prox RCA lesion is 60% stenosed.  Mid RCA to Dist RCA lesion is 80% stenosed.  Prox LAD to Mid LAD lesion is 80% stenosed.  Dist LAD lesion is 80% stenosed.  Cindy [name] is a 57 y.o. female   [id number] LOCATION:  FACILITY: [facility] PHYSICIAN: Jonathan Berry, M.D. 07/19/1966   DATE OF PROCEDURE:  07/07/2020  DATE OF DISCHARGE:     CARDIAC CATHETERIZATION    History obtained from chart review.Cindy [name] is a 57 y.o. female with a history of stroke, obstructive sleep apnea on CPAP, hypertension, hyperlipidemia, diabetes mellitus, GERD, and morbid obesity who presented to the ED with chest pain.  She ruled out for myocardial infarction.  Her EKG showed no acute changes.  She was heparinized and had no recurrent chest pain.  She does have a family history of heart disease.  She presents now for diagnostic coronary angiography.  Impression [name] has significant two-vessel disease in a codominant system including a calcified segmental proximal LAD lesion, mid to distal LAD lesion and distal codominant RCA lesion.  Her LVEDP was normal.  Her LV appeared normal with hand-injection although 2D echo will be obtained.  I used 180 cc of contrast to do the diagnostic because of extreme tortuosity in the brachiocephalic system.  I do not think intervention could be performed safely radially and I suspect she will need femoral access.  In addition, I did use 20 minutes of  fluoroscopy time because of her large stature (112 kg).  I suspect the proximal LAD lesion would optimally be treated with orbital atherectomy for debulking prior to stenting although there is some tortuosity in the proximal ID which may make this technically challenging.  I did take the liberty of loading her with Plavix.  Her cardiac enzymes were negative.  I am going to reheparinize her.  The sheath was removed and a TR band was placed on the right wrist to achieve patent hemostasis.  The patient left the lab in stable condition.  Jonathan Berry. MD, FACC 07/07/2020 8:40 AM  Findings Coronary Findings Diagnostic  Dominance: Co-dominant  Left Anterior Descending Prox LAD to Mid LAD lesion is 80% stenosed. The lesion is calcified. Dist LAD lesion is 80% stenosed.  Right Coronary Artery Prox RCA lesion is 60% stenosed. Vessel is not the culprit lesion. Mid RCA to Dist RCA lesion is 80% stenosed.  Intervention  No interventions have been documented.   STRESS TESTS  NM PET CT CARDIAC PERFUSION MULTI W/ABSOLUTE BLOODFLOW 04/20/2023  Narrative   Normal perfusion. No increase in LVEF with stress. No TID. MBF abnormal, 1.25. Findings are either related to obstructive CAD or microvascular disease given the patient's poorly controlled diabetes.   LV perfusion is normal. There is no evidence of ischemia. There is no evidence   of infarction.   Rest left ventricular function is normal. Rest EF: 72 %. Stress left ventricular function is normal. Stress EF: 72 %. End diastolic cavity size is normal.   Myocardial blood flow was computed to be 1.25ml/g/min at rest and 1.56ml/g/min at stress. Global myocardial blood flow reserve was 1.25 and was abnormal.   Coronary calcium assessment not performed due to prior revascularization.   The study is normal. The study is low risk.  Electronically signed by Taylor O'Neal,  MD _____________________________________________________________________________________________________  EXAM: OVER-READ INTERPRETATION  PET-CT CHEST  The following report is an over-read performed by radiologist Dr. Eric Mansellof [facility] on 04/20/2023. This over-read does not include interpretation of cardiac or coronary anatomy or pathology. The cardiac PET and cardiac CT interpretation by the cardiologist is to be attached.  COMPARISON:  None.  FINDINGS: No evidence for lymphadenopathy within the visualized mediastinum or hilar regions.  Patchy ground-glass opacity in the dependent lung bases is probably atelectatic. No pleural effusion.  Visualized portions of the upper abdomen are unremarkable.  No suspicious lytic or sclerotic osseous abnormality.  IMPRESSION: No acute or clinically significant extracardiac findings.   Electronically Signed By: Eric  Mansell M.D. On: 04/20/2023 11:28   ECHOCARDIOGRAM  ECHOCARDIOGRAM COMPLETE 07/07/2020  Narrative ECHOCARDIOGRAM REPORT    Patient Name:   Cindy [name] Date of Exam: 07/07/2020 Medical Rec #:  [id number]          Height:       63.0 in Accession #:    [id number]         Weight:       250.2 lb Date of Birth:  04/24/1966         BSA:          2.127 m Patient Age:    53 years           BP:           144/68 mmHg Patient Gender: F                  HR:           70 bpm. Exam Location:  Inpatient  Procedure: 2D Echo  Indications:    chest pain 786.50  History:        Patient has prior history of Echocardiogram examinations, most recent 08/19/2018. Risk Factors:Diabetes, Dyslipidemia and Hypertension.  Sonographer:    Lauren Pennington Referring Phys: [id number] CALLIE E GOODRICH  IMPRESSIONS   1. Left ventricular ejection fraction, by estimation, is 60 to 65%. The left ventricle has normal function. The left ventricle has no regional wall motion abnormalities. There is mild concentric left  ventricular hypertrophy. Left ventricular diastolic parameters were normal. 2. Right ventricular systolic function is normal. The right ventricular size is normal. Tricuspid regurgitation signal is inadequate for assessing PA pressure. 3. The mitral valve is grossly normal. Trivial mitral valve regurgitation. No evidence of mitral stenosis. 4. The aortic valve is tricuspid. Aortic valve regurgitation is not visualized. No aortic stenosis is present. 5. The inferior vena cava is normal in size with greater than 50% respiratory variability, suggesting right atrial pressure of 3 mmHg.  FINDINGS Left Ventricle: Left ventricular ejection fraction, by estimation, is 60 to 65%. The left ventricle has normal function. The left ventricle has no regional wall motion abnormalities. The left ventricular internal cavity size was normal in size. There is mild concentric left ventricular hypertrophy. Left ventricular diastolic parameters were normal. Normal left ventricular filling pressure.    Right Ventricle: The right ventricular size is normal. No increase in right ventricular wall thickness. Right ventricular systolic function is normal. Tricuspid regurgitation signal is inadequate for assessing PA pressure.  Left Atrium: Left atrial size was normal in size.  Right Atrium: Right atrial size was normal in size.  Pericardium: Trivial pericardial effusion is present. Presence of pericardial fat pad.  Mitral Valve: The mitral valve is grossly normal. Trivial mitral valve regurgitation. No evidence of mitral valve stenosis.  Tricuspid Valve: The tricuspid valve is grossly normal. Tricuspid valve regurgitation is trivial. No evidence of tricuspid stenosis.  Aortic Valve: The aortic valve is tricuspid. Aortic valve regurgitation is not visualized. No aortic stenosis is present.  Pulmonic Valve: The pulmonic valve was grossly normal. Pulmonic valve regurgitation is not visualized.  Aorta: The aortic root and  ascending aorta are structurally normal, with no evidence of dilitation.  Venous: The inferior vena cava is normal in size with greater than 50% respiratory variability, suggesting right atrial pressure of 3 mmHg.  IAS/Shunts: The atrial septum is grossly normal.   LEFT VENTRICLE PLAX 2D LVIDd:         4.00 cm  Diastology LVIDs:         2.60 cm  LV e' lateral:   7.40 cm/s LV PW:         1.20 cm  LV E/e' lateral: 14.5 LV IVS:        1.20 cm  LV e' medial:    7.29 cm/s LVOT diam:     2.00 cm  LV E/e' medial:  14.7 LV SV:         75 LV SV Index:   35 LVOT Area:     3.14 cm   RIGHT VENTRICLE             IVC RV S prime:     11.30 cm/s  IVC diam: 2.00 cm TAPSE (M-mode): 2.6 cm  LEFT ATRIUM             Index       RIGHT ATRIUM           Index LA diam:        4.00 cm 1.88 cm/m  RA Area:     13.00 cm LA Vol (A2C):   42.6 ml 20.03 ml/m RA Volume:   29.50 ml  13.87 ml/m LA Vol (A4C):   52.7 ml 24.78 ml/m LA Biplane Vol: 50.2 ml 23.60 ml/m AORTIC VALVE LVOT Vmax:   96.70 cm/s LVOT Vmean:  62.700 cm/s LVOT VTI:    0.240 m  AORTA Ao Root diam: 2.70 cm Ao Asc diam:  3.10 cm  MITRAL VALVE MV Area (PHT): 3.31 cm     SHUNTS MV Decel Time: 229 msec     Systemic VTI:  0.24 m MV E velocity: 107.00 cm/s  Systemic Diam: 2.00 cm MV A velocity: 101.00 cm/s MV E/A ratio:  1.06  [place] O'Neal MD Electronically signed by  O'Neal MD Signature Date/Time: 07/07/2020/3:27:27 PM    Final            Recent Labs: 12/28/2022: ALT 13 02/17/2023: B Natriuretic Peptide 10.7; Hemoglobin 10.9; Platelets 382 03/02/2023: BUN 15; Creatinine, Ser 0.77; Potassium 4.2; Sodium 138  Recent Lipid Panel    Component Value Date/Time   CHOL 129 12/28/2022 1130   TRIG 113 12/28/2022 1130   HDL 43 12/28/2022 1130   CHOLHDL 3.0 12/28/2022 1130   CHOLHDL 6.1 07/07/2020 0118   VLDL 30 07/07/2020   0118   LDLCALC 65 12/28/2022 1130    History of Present Illness    56-year-old female with the above  past medical history including CAD s/p DES x 2-LAD in 2021, hypertension, hyperlipidemia, type 2 diabetes, OSA, and GERD.   Echocardiogram in 06/2020 showed EF 60 to 65%, normal LV function, no RWMA, mild concentric LVH, normal RV systolic function, no significant valvular abnormalities.  At her follow-up visit in 08/2022 she noted rare intermittent chest discomfort.  She was started on low-dose Imdur.  DAPT was transitioned to Plavix monotherapy. She presented to the ED on 02/17/2023 with chest pain.  Troponin was flat.  Cardiology was consulted.  She underwent outpatient Lexiscan Myoview which suggested normal perfusion however, there was evidence of transient ischemic dilation.  It was noted that should she have recurrent chest pain, she would likely benefit from further ischemic evaluation with cardiac PET stress test or cardiac catheterization.  She was last seen in the office on 03/04/2023 and was stable overall from a cardiac standpoint.  She denies any recurrent chest pain, however, she did note significant dyspnea on exertion, similar to prior anginal equivalent.  Cardiac PET stress test revealed overall normal perfusion, no evidence of ischemia or infarction, however, myocardial blood flow was abnormal.  Follow-up cardiac catheterization was recommended to further define coronary anatomy given ongoing symptoms and uncontrolled diabetes.   She presents today for follow-up accompanied by her daughter.  Since her last visit she has been stable overall from a cardiac standpoint.  She continues to have dyspnea with minimal exertion, she denies chest pain, palpitations, dizziness.  She has nonpitting dependent bilateral lower extremity edema, she denies PND, orthopnea, weight gain.  Home Medications    Current Outpatient Medications  Medication Sig Dispense Refill   acetaminophen (TYLENOL) 325 MG tablet Take 2 tablets (650 mg total) by mouth every 4 (four) hours as needed for headache or mild pain.      atorvastatin (LIPITOR) 80 MG tablet Take 1 tablet (80 mg total) by mouth daily. 90 tablet 3   Blood Glucose Monitoring Suppl (TRUE METRIX METER) w/Device KIT Use to test blood sugar 2 (two) times daily. 1 kit 0   clopidogrel (PLAVIX) 75 MG tablet Take 1 tablet (75 mg total) by mouth daily. 30 tablet 11   Continuous Blood Gluc Sensor (FREESTYLE LIBRE 3 SENSOR) MISC Place 1 sensor on the skin every 14 days. Use to check glucose continuously 2 each 2   ezetimibe (ZETIA) 10 MG tablet Take 1 tablet (10 mg total) by mouth daily. 90 tablet 3   FARXIGA 10 MG TABS tablet Take 1 tablet (10 mg total) by mouth daily. 30 tablet 6   glucose blood test strip Use as instructed to test blood sugar 2 times daily 100 each 12   Insulin Glargine (BASAGLAR KWIKPEN) 100 UNIT/ML Inject 40 Units into the skin daily. 3 mL 3   insulin glargine (LANTUS) 100 UNIT/ML Solostar Pen Inject 40 Units into the skin daily. 12 mL 0   Insulin Pen Needle (PEN NEEDLES) 32G X 4 MM MISC Use as directed 100 each 0   Insulin Syringes, Disposable, U-100 0.5 ML MISC 30 Units by Does not apply route 2 (two) times daily with a meal. 100 each 3   isosorbide mononitrate (IMDUR) 60 MG 24 hr tablet Take 1 tablet (60 mg total) by mouth daily. 90 tablet 3   metFORMIN (GLUCOPHAGE-XR) 500 MG 24 hr tablet Take 1 tablet (500 mg total) by mouth 2 (two)   times daily with a meal. 60 tablet 2   metoprolol succinate (TOPROL-XL) 25 MG 24 hr tablet Take 1 tablet (25 mg total) by mouth daily. 90 tablet 3   nitroGLYCERIN (NITROSTAT) 0.4 MG SL tablet Place 1 tablet (0.4 mg total) under the tongue every 5 (five) minutes as needed for chest pain. 25 tablet 4   pantoprazole (PROTONIX) 20 MG tablet Take 1 tablet (20 mg total) by mouth daily. 90 tablet 2   sertraline (ZOLOFT) 50 MG tablet Take 1 tablet (50 mg total) by mouth at bedtime. 90 tablet 0   TRUEplus Lancets 28G MISC Use to test blood sugar 2 (two) times daily. 100 each 0   hydrochlorothiazide (HYDRODIURIL) 25 MG  tablet Take 1/2 tablet (12.5 mg total) by mouth daily. 45 tablet 3   lisinopril (ZESTRIL) 40 MG tablet Take 1 tablet (40 mg total) by mouth daily. 90 tablet 3   No current facility-administered medications for this visit.     Review of Systems    She denies chest pain, palpitations, pnd, orthopnea, n, v, dizziness, syncope, edema, weight gain, or early satiety. All other systems reviewed and are otherwise negative except as noted above.   Physical Exam    VS:  BP 110/64   Pulse 75   Ht 5' 3" (1.6 m)   Wt 234 lb (106.1 kg)   SpO2 97%   BMI 41.45 kg/m  GEN: Well nourished, well developed, in no acute distress. HEENT: normal. Neck: Supple, no JVD, carotid bruits, or masses. Cardiac: RRR, no murmurs, rubs, or gallops. No clubbing, cyanosis, edema.  Radials/DP/PT 2+ and equal bilaterally.  Respiratory:  Respirations regular and unlabored, clear to auscultation bilaterally. GI: Soft, nontender, nondistended, BS + x 4. MS: no deformity or atrophy. Skin: warm and dry, no rash. Neuro:  Strength and sensation are intact. Psych: Normal affect.  Accessory Clinical Findings    ECG personally reviewed by me today -sinus rhythm, 74 bpm, first-degree AV block, RBBB- no acute changes.   Lab Results  Component Value Date   WBC 8.4 02/17/2023   HGB 10.9 (L) 02/17/2023   HCT 32.9 (L) 02/17/2023   MCV 93.5 02/17/2023   PLT 382 02/17/2023   Lab Results  Component Value Date   CREATININE 0.77 03/02/2023   BUN 15 03/02/2023   NA 138 03/02/2023   K 4.2 03/02/2023   CL 98 03/02/2023   CO2 24 03/02/2023   Lab Results  Component Value Date   ALT 13 12/28/2022   AST 10 12/28/2022   ALKPHOS 123 (H) 12/28/2022   BILITOT <0.2 12/28/2022   Lab Results  Component Value Date   CHOL 129 12/28/2022   HDL 43 12/28/2022   LDLCALC 65 12/28/2022   TRIG 113 12/28/2022   CHOLHDL 3.0 12/28/2022    Lab Results  Component Value Date   HGBA1C >15.5 (H) 02/17/2023    Assessment & Plan    1.  CAD/dyspnea on exertion: S/p DES x 2-LAD in 2021. Lexiscan Myoview in 01/2023 suggested normal perfusion, however, there was evidence of transient ischemic dilation.  Cardiac PET stress test revealed overall normal perfusion, no evidence of ischemia or infarction, however, myocardial blood flow was abnormal. Follow-up cardiac catheterization was recommended to further define coronary anatomy given ongoing symptoms, history of CAD, and uncontrolled diabetes.  She continues to note significant dyspnea with minimal exertion.  It appears this was a component of her prior anginal equivalent as well.  Through shared decision-making, and per Dr. Acharya's recommendation,   will pursue LHC.  Will increase Imdur to 60 mg daily. Discussed ED precautions. She will take half of her Lantus dose the night before her procedure.  I advised her to hold metformin for 24 hours prior to her procedure.  She will take Plavix the morning of her procedure.  I also advised her to hold her hydrochlorothiazide the morning of her procedure. Will update CBC, BMET. LHC has been scheduled for 05/03/2023 with Dr. Jordan.  Continue Plavix, hydrochlorothiazide, lisinopril, metoprolol, Imdur, Lipitor, and Zetia.   Shared Decision Making/Informed Consent The risks [stroke (1 in 1000), death (1 in 1000), kidney failure [usually temporary] (1 in 500), bleeding (1 in 200), allergic reaction [possibly serious] (1 in 200)], benefits (diagnostic support and management of coronary artery disease) and alternatives of a cardiac catheterization were discussed in detail with Ms. Runnion and she is willing to proceed.  2. Hypertension: BP well controlled.  Continue to monitor with increased Imdur as above.  Continue current antihypertensive regimen.    3. Hyperlipidemia: LDL was 65 in 11/2022.  Continue Lipitor, Zetia.    4. Type 2 diabetes: A1c was > 14.0 in 05/2022.  She does have some peripheral neuropathy.  Following with endocrinology.   5. OSA: Adherent  to CPAP.  Denies any concerns.   6. Disposition: Follow-up 2 weeks post cath.       Kirti Carl C Oden Lindaman, NP 04/28/2023, 9:52 AM    

## 2023-04-28 NOTE — Progress Notes (Signed)
Office Visit    Patient Name: Cindy [name] Date of Encounter: 04/28/2023  Primary Care Provider:  Olegario Messier, MD Primary Cardiologist:  Parke Poisson, MD  Chief Complaint    57 year old female with a history of CAD s/p DES x 2-LAD in 2021, hypertension, hyperlipidemia, type 2 diabetes, OSA, and GERD who presents for follow-up related to CAD.   Past Medical History    Past Medical History:  Diagnosis Date   CAD in native artery 07/09/2020   Coronary artery disease    Diabetes mellitus    GERD (gastroesophageal reflux disease)    Hyperlipemia    Hypertension    S/P angioplasty with stent 07/09/2020   Stroke [facility])    Past Surgical History:  Procedure Laterality Date   CARDIAC CATHETERIZATION     CORONARY STENT INTERVENTION N/A 07/08/2020   Procedure: CORONARY STENT INTERVENTION;  Surgeon: Corky Crafts, MD;  Location: [facility] INVASIVE CV LAB;  Service: Cardiovascular;  Laterality: N/A;   CORONARY ULTRASOUND/IVUS N/A 07/08/2020   Procedure: Intravascular Ultrasound/IVUS;  Surgeon: Corky Crafts, MD;  Location: [facility] INVASIVE CV LAB;  Service: Cardiovascular;  Laterality: N/A;   LEFT HEART CATH AND CORONARY ANGIOGRAPHY N/A 07/07/2020   Procedure: LEFT HEART CATH AND CORONARY ANGIOGRAPHY;  Surgeon: Runell Gess, MD;  Location: [facility] INVASIVE CV LAB;  Service: Cardiovascular;  Laterality: N/A;   None      Allergies  Allergies  Allergen Reactions   Other     Hives to an unknown medication given in 2012 at [facility]      Labs/Other Studies Reviewed    The following studies were reviewed today:  Cardiac Studies & Procedures   CARDIAC CATHETERIZATION  CARDIAC CATHETERIZATION 07/08/2020  Narrative  Dist LAD lesion is 80% stenosed.  A drug-eluting stent was successfully placed using a STENT RESOLUTE ONYX 2.5X18, postdilated with a 3.0 [place] balloon and optimized wth IVUS.  Post intervention, there is a 0% residual stenosis.  Mid LAD lesion is 80%  stenosed. Minimal calcification by IVUS so atherectomy was not required.  A drug-eluting stent was successfully placed using a STENT RESOLUTE ONYX 3.5X22, postdilated with a 4.0 [place] balloon and optimized wth IVUS.  Post intervention, there is a 0% residual stenosis.  Continue aggressive secondary prevention.  I stressed the importance of DAPT.  Findings Coronary Findings Diagnostic  Dominance: Co-dominant  Left Anterior Descending Mid LAD lesion is 80% stenosed. The lesion is calcified. Ultrasound (IVUS) was performed. Severe plaque burden was detected. The lesion has a fibro-fatty core and a necrotic core. not much calcium noted by IVUS. Dist LAD lesion is 80% stenosed.  Intervention  Mid LAD lesion Stent CATH LAUNCHER 6FR EBU 3.75 guide catheter was inserted. Lesion crossed with guidewire using a WIRE ASAHI PROWATER 180CM. Pre-stent angioplasty was performed using a BALLOON SAPPHIRE 2.5X15. A drug-eluting stent was successfully placed using a STENT RESOLUTE ONYX 3.5X22. Stent strut is well apposed. Post-stent angioplasty was performed using a BALLOON SAPPHIRE [place] 4.0X12. IVUS was used to optimize the stent size, and performed before and after stenting with 5 Fr Opticross catheter.  Initially, there was plan for atherecetomy but given that there was not much calcium, we performed balloon and stenting. Post-Intervention Lesion Assessment The intervention was successful. Pre-interventional TIMI flow is 3. Post-intervention TIMI flow is 3. No complications occurred at this lesion. There is a 0% residual stenosis post intervention.  Dist LAD lesion Stent CATH LAUNCHER 6FR EBU 3.75 guide catheter was inserted. Lesion crossed  with guidewire using a WIRE ASAHI PROWATER 180CM. Pre-stent angioplasty was performed using a BALLOON SAPPHIRE 2.5X15. A drug-eluting stent was successfully placed using a STENT RESOLUTE ONYX 2.5X18. Stent strut is well apposed. Post-stent angioplasty was performed using a  BALLOON SAPPHIRE [place] 3.0X12. IVUS did not cross initially.  IVUS was done after stent placement due to haziness at distal edge of stent, but there was no evidence of edge dissection. Post-Intervention Lesion Assessment The intervention was successful. Pre-interventional TIMI flow is 3. Post-intervention TIMI flow is 3. No complications occurred at this lesion. There is a 0% residual stenosis post intervention.   CARDIAC CATHETERIZATION 07/07/2020  Narrative Images from the original result were not included.   Prox RCA lesion is 60% stenosed.  Mid RCA to Dist RCA lesion is 80% stenosed.  Prox LAD to Mid LAD lesion is 80% stenosed.  Dist LAD lesion is 80% stenosed.  Cindy [name] is a 57 y.o. female   [id number] LOCATION:  FACILITY: [facility] PHYSICIAN: Nanetta Batty, M.D. 1966/03/26   DATE OF PROCEDURE:  07/07/2020  DATE OF DISCHARGE:     CARDIAC CATHETERIZATION    History obtained from chart review.Cindy [name] is a 57 y.o. female with a history of stroke, obstructive sleep apnea on CPAP, hypertension, hyperlipidemia, diabetes mellitus, GERD, and morbid obesity who presented to the ED with chest pain.  She ruled out for myocardial infarction.  Her EKG showed no acute changes.  She was heparinized and had no recurrent chest pain.  She does have a family history of heart disease.  She presents now for diagnostic coronary angiography.  Impression Cindy [name] has significant two-vessel disease in a codominant system including a calcified segmental proximal LAD lesion, mid to distal LAD lesion and distal codominant RCA lesion.  Her LVEDP was normal.  Her LV appeared normal with hand-injection although 2D echo will be obtained.  I used 180 cc of contrast to do the diagnostic because of extreme tortuosity in the brachiocephalic system.  I do not think intervention could be performed safely radially and I suspect she will need femoral access.  In addition, I did use 20 minutes of  fluoroscopy time because of her large stature (112 kg).  I suspect the proximal LAD lesion would optimally be treated with orbital atherectomy for debulking prior to stenting although there is some tortuosity in the proximal ID which may make this technically challenging.  I did take the liberty of loading her with Plavix.  Her cardiac enzymes were negative.  I am going to reheparinize her.  The sheath was removed and a TR band was placed on the right wrist to achieve patent hemostasis.  The patient left the lab in stable condition.  Nanetta Batty. MD, [facility] 07/07/2020 8:40 AM  Findings Coronary Findings Diagnostic  Dominance: Co-dominant  Left Anterior Descending Prox LAD to Mid LAD lesion is 80% stenosed. The lesion is calcified. Dist LAD lesion is 80% stenosed.  Right Coronary Artery Prox RCA lesion is 60% stenosed. Vessel is not the culprit lesion. Mid RCA to Dist RCA lesion is 80% stenosed.  Intervention  No interventions have been documented.   STRESS TESTS  NM PET CT CARDIAC PERFUSION MULTI W/ABSOLUTE BLOODFLOW 04/20/2023  Narrative   Normal perfusion. No increase in LVEF with stress. No TID. MBF abnormal, 1.25. Findings are either related to obstructive CAD or microvascular disease given the patient's poorly controlled diabetes.   LV perfusion is normal. There is no evidence of ischemia. There is no evidence  of infarction.   Rest left ventricular function is normal. Rest EF: 72 %. Stress left ventricular function is normal. Stress EF: 72 %. End diastolic cavity size is normal.   Myocardial blood flow was computed to be 1.73ml/g/min at rest and 1.53ml/g/min at stress. Global myocardial blood flow reserve was 1.25 and was abnormal.   Coronary calcium assessment not performed due to prior revascularization.   The study is normal. The study is low risk.  Electronically signed by Lennie Odor,  MD _____________________________________________________________________________________________________  Francia Greaves: OVER-READ INTERPRETATION  PET-CT CHEST  The following report is an over-read performed by radiologist Dr. Leatha Gilding [facility] on 04/20/2023. This over-read does not include interpretation of cardiac or coronary anatomy or pathology. The cardiac PET and cardiac CT interpretation by the cardiologist is to be attached.  COMPARISON:  None.  FINDINGS: No evidence for lymphadenopathy within the visualized mediastinum or hilar regions.  Patchy ground-glass opacity in the dependent lung bases is probably atelectatic. No pleural effusion.  Visualized portions of the upper abdomen are unremarkable.  No suspicious lytic or sclerotic osseous abnormality.  IMPRESSION: No acute or clinically significant extracardiac findings.   Electronically Signed By: Kennith Center M.D. On: 04/20/2023 11:28   ECHOCARDIOGRAM  ECHOCARDIOGRAM COMPLETE 07/07/2020  Narrative ECHOCARDIOGRAM REPORT    Patient Name:   Cindy [name] Date of Exam: 07/07/2020 Medical Rec #:  [id number]          Height:       63.0 in Accession #:    [id number]         Weight:       250.2 lb Date of Birth:  19-Jul-1966         BSA:          2.127 m Patient Age:    53 years           BP:           144/68 mmHg Patient Gender: F                  HR:           70 bpm. Exam Location:  Inpatient  Procedure: 2D Echo  Indications:    chest pain 786.50  History:        Patient has prior history of Echocardiogram examinations, most recent 08/19/2018. Risk Factors:Diabetes, Dyslipidemia and Hypertension.  Sonographer:    Delcie Roch Referring Phys: [id number] CALLIE E GOODRICH  IMPRESSIONS   1. Left ventricular ejection fraction, by estimation, is 60 to 65%. The left ventricle has normal function. The left ventricle has no regional wall motion abnormalities. There is mild concentric left  ventricular hypertrophy. Left ventricular diastolic parameters were normal. 2. Right ventricular systolic function is normal. The right ventricular size is normal. Tricuspid regurgitation signal is inadequate for assessing PA pressure. 3. The mitral valve is grossly normal. Trivial mitral valve regurgitation. No evidence of mitral stenosis. 4. The aortic valve is tricuspid. Aortic valve regurgitation is not visualized. No aortic stenosis is present. 5. The inferior vena cava is normal in size with greater than 50% respiratory variability, suggesting right atrial pressure of 3 mmHg.  FINDINGS Left Ventricle: Left ventricular ejection fraction, by estimation, is 60 to 65%. The left ventricle has normal function. The left ventricle has no regional wall motion abnormalities. The left ventricular internal cavity size was normal in size. There is mild concentric left ventricular hypertrophy. Left ventricular diastolic parameters were normal. Normal left ventricular filling pressure.  Right Ventricle: The right ventricular size is normal. No increase in right ventricular wall thickness. Right ventricular systolic function is normal. Tricuspid regurgitation signal is inadequate for assessing PA pressure.  Left Atrium: Left atrial size was normal in size.  Right Atrium: Right atrial size was normal in size.  Pericardium: Trivial pericardial effusion is present. Presence of pericardial fat pad.  Mitral Valve: The mitral valve is grossly normal. Trivial mitral valve regurgitation. No evidence of mitral valve stenosis.  Tricuspid Valve: The tricuspid valve is grossly normal. Tricuspid valve regurgitation is trivial. No evidence of tricuspid stenosis.  Aortic Valve: The aortic valve is tricuspid. Aortic valve regurgitation is not visualized. No aortic stenosis is present.  Pulmonic Valve: The pulmonic valve was grossly normal. Pulmonic valve regurgitation is not visualized.  Aorta: The aortic root and  ascending aorta are structurally normal, with no evidence of dilitation.  Venous: The inferior vena cava is normal in size with greater than 50% respiratory variability, suggesting right atrial pressure of 3 mmHg.  IAS/Shunts: The atrial septum is grossly normal.   LEFT VENTRICLE PLAX 2D LVIDd:         4.00 cm  Diastology LVIDs:         2.60 cm  LV e' lateral:   7.40 cm/s LV PW:         1.20 cm  LV E/e' lateral: 14.5 LV IVS:        1.20 cm  LV e' medial:    7.29 cm/s LVOT diam:     2.00 cm  LV E/e' medial:  14.7 LV SV:         75 LV SV Index:   35 LVOT Area:     3.14 cm   RIGHT VENTRICLE             IVC RV S prime:     11.30 cm/s  IVC diam: 2.00 cm TAPSE (M-mode): 2.6 cm  LEFT ATRIUM             Index       RIGHT ATRIUM           Index LA diam:        4.00 cm 1.88 cm/m  RA Area:     13.00 cm LA Vol (A2C):   42.6 ml 20.03 ml/m RA Volume:   29.50 ml  13.87 ml/m LA Vol (A4C):   52.7 ml 24.78 ml/m LA Biplane Vol: 50.2 ml 23.60 ml/m AORTIC VALVE LVOT Vmax:   96.70 cm/s LVOT Vmean:  62.700 cm/s LVOT VTI:    0.240 m  AORTA Ao Root diam: 2.70 cm Ao Asc diam:  3.10 cm  MITRAL VALVE MV Area (PHT): 3.31 cm     SHUNTS MV Decel Time: 229 msec     Systemic VTI:  0.24 m MV E velocity: 107.00 cm/s  Systemic Diam: 2.00 cm MV A velocity: 101.00 cm/s MV E/A ratio:  1.06  Lennie Odor MD Electronically signed by Lennie Odor MD Signature Date/Time: 07/07/2020/3:27:27 PM    Final            Recent Labs: 12/28/2022: ALT 13 02/17/2023: B Natriuretic Peptide 10.7; Hemoglobin 10.9; Platelets 382 03/02/2023: BUN 15; Creatinine, Ser 0.77; Potassium 4.2; Sodium 138  Recent Lipid Panel    Component Value Date/Time   CHOL 129 12/28/2022 1130   TRIG 113 12/28/2022 1130   HDL 43 12/28/2022 1130   CHOLHDL 3.0 12/28/2022 1130   CHOLHDL 6.1 07/07/2020 0118   VLDL 30 07/07/2020  0118   LDLCALC 65 12/28/2022 1130    History of Present Illness    57 year old female with the above  past medical history including CAD s/p DES x 2-LAD in 2021, hypertension, hyperlipidemia, type 2 diabetes, OSA, and GERD.   Echocardiogram in 06/2020 showed EF 60 to 65%, normal LV function, no RWMA, mild concentric LVH, normal RV systolic function, no significant valvular abnormalities.  At her follow-up visit in 08/2022 she noted rare intermittent chest discomfort.  She was started on low-dose Imdur.  DAPT was transitioned to Plavix monotherapy. She presented to the ED on 02/17/2023 with chest pain.  Troponin was flat.  Cardiology was consulted.  She underwent outpatient [facility] which suggested normal perfusion however, there was evidence of transient ischemic dilation.  It was noted that should she have recurrent chest pain, she would likely benefit from further ischemic evaluation with cardiac PET stress test or cardiac catheterization.  She was last seen in the office on 03/04/2023 and was stable overall from a cardiac standpoint.  She denies any recurrent chest pain, however, she did note significant dyspnea on exertion, similar to prior anginal equivalent.  Cardiac PET stress test revealed overall normal perfusion, no evidence of ischemia or infarction, however, myocardial blood flow was abnormal.  Follow-up cardiac catheterization was recommended to further define coronary anatomy given ongoing symptoms and uncontrolled diabetes.   She presents today for follow-up accompanied by her daughter.  Since her last visit she has been stable overall from a cardiac standpoint.  She continues to have dyspnea with minimal exertion, she denies chest pain, palpitations, dizziness.  She has nonpitting dependent bilateral lower extremity edema, she denies PND, orthopnea, weight gain.  Home Medications    Current Outpatient Medications  Medication Sig Dispense Refill   acetaminophen (TYLENOL) 325 MG tablet Take 2 tablets (650 mg total) by mouth every 4 (four) hours as needed for headache or mild pain.      atorvastatin (LIPITOR) 80 MG tablet Take 1 tablet (80 mg total) by mouth daily. 90 tablet 3   Blood Glucose Monitoring Suppl (TRUE METRIX METER) w/Device KIT Use to test blood sugar 2 (two) times daily. 1 kit 0   clopidogrel (PLAVIX) 75 MG tablet Take 1 tablet (75 mg total) by mouth daily. 30 tablet 11   Continuous Blood Gluc Sensor (FREESTYLE LIBRE 3 SENSOR) MISC Place 1 sensor on the skin every 14 days. Use to check glucose continuously 2 each 2   ezetimibe (ZETIA) 10 MG tablet Take 1 tablet (10 mg total) by mouth daily. 90 tablet 3   FARXIGA 10 MG TABS tablet Take 1 tablet (10 mg total) by mouth daily. 30 tablet 6   glucose blood test strip Use as instructed to test blood sugar 2 times daily 100 each 12   Insulin Glargine (BASAGLAR KWIKPEN) 100 UNIT/ML Inject 40 Units into the skin daily. 3 mL 3   insulin glargine (LANTUS) 100 UNIT/ML Solostar Pen Inject 40 Units into the skin daily. 12 mL 0   Insulin Pen Needle (PEN NEEDLES) 32G X 4 MM MISC Use as directed 100 each 0   Insulin Syringes, Disposable, U-100 0.5 ML MISC 30 Units by Does not apply route 2 (two) times daily with a meal. 100 each 3   isosorbide mononitrate (IMDUR) 60 MG 24 hr tablet Take 1 tablet (60 mg total) by mouth daily. 90 tablet 3   metFORMIN (GLUCOPHAGE-XR) 500 MG 24 hr tablet Take 1 tablet (500 mg total) by mouth 2 (two)  times daily with a meal. 60 tablet 2   metoprolol succinate (TOPROL-XL) 25 MG 24 hr tablet Take 1 tablet (25 mg total) by mouth daily. 90 tablet 3   nitroGLYCERIN (NITROSTAT) 0.4 MG SL tablet Place 1 tablet (0.4 mg total) under the tongue every 5 (five) minutes as needed for chest pain. 25 tablet 4   pantoprazole (PROTONIX) 20 MG tablet Take 1 tablet (20 mg total) by mouth daily. 90 tablet 2   sertraline (ZOLOFT) 50 MG tablet Take 1 tablet (50 mg total) by mouth at bedtime. 90 tablet 0   TRUEplus Lancets 28G MISC Use to test blood sugar 2 (two) times daily. 100 each 0   hydrochlorothiazide (HYDRODIURIL) 25 MG  tablet Take 1/2 tablet (12.5 mg total) by mouth daily. 45 tablet 3   lisinopril (ZESTRIL) 40 MG tablet Take 1 tablet (40 mg total) by mouth daily. 90 tablet 3   No current facility-administered medications for this visit.     Review of Systems    She denies chest pain, palpitations, pnd, orthopnea, n, v, dizziness, syncope, edema, weight gain, or early satiety. All other systems reviewed and are otherwise negative except as noted above.   Physical Exam    VS:  BP 110/64   Pulse 75   Ht 5\' 3"  (1.6 m)   Wt 234 lb (106.1 kg)   SpO2 97%   BMI 41.45 kg/m  GEN: Well nourished, well developed, in no acute distress. HEENT: normal. Neck: Supple, no JVD, carotid bruits, or masses. Cardiac: RRR, no murmurs, rubs, or gallops. No clubbing, cyanosis, edema.  Radials/DP/PT 2+ and equal bilaterally.  Respiratory:  Respirations regular and unlabored, clear to auscultation bilaterally. GI: Soft, nontender, nondistended, BS + x 4. MS: no deformity or atrophy. Skin: warm and dry, no rash. Neuro:  Strength and sensation are intact. Psych: Normal affect.  Accessory Clinical Findings    ECG personally reviewed by me today -sinus rhythm, 74 bpm, first-degree AV block, RBBB- no acute changes.   Lab Results  Component Value Date   WBC 8.4 02/17/2023   HGB 10.9 (L) 02/17/2023   HCT 32.9 (L) 02/17/2023   MCV 93.5 02/17/2023   PLT 382 02/17/2023   Lab Results  Component Value Date   CREATININE 0.77 03/02/2023   BUN 15 03/02/2023   NA 138 03/02/2023   K 4.2 03/02/2023   CL 98 03/02/2023   CO2 24 03/02/2023   Lab Results  Component Value Date   ALT 13 12/28/2022   AST 10 12/28/2022   ALKPHOS 123 (H) 12/28/2022   BILITOT <0.2 12/28/2022   Lab Results  Component Value Date   CHOL 129 12/28/2022   HDL 43 12/28/2022   LDLCALC 65 12/28/2022   TRIG 113 12/28/2022   CHOLHDL 3.0 12/28/2022    Lab Results  Component Value Date   HGBA1C >15.5 (H) 02/17/2023    Assessment & Plan    1.  CAD/dyspnea on exertion: S/p DES x 2-LAD in 2021. Lexiscan Myoview in 01/2023 suggested normal perfusion, however, there was evidence of transient ischemic dilation.  Cardiac PET stress test revealed overall normal perfusion, no evidence of ischemia or infarction, however, myocardial blood flow was abnormal. Follow-up cardiac catheterization was recommended to further define coronary anatomy given ongoing symptoms, history of CAD, and uncontrolled diabetes.  She continues to note significant dyspnea with minimal exertion.  It appears this was a component of her prior anginal equivalent as well.  Through shared decision-making, and per Dr. Lupe Carney recommendation,  will pursue LHC.  Will increase Imdur to 60 mg daily. Discussed ED precautions. She will take half of her Lantus dose the night before her procedure.  I advised her to hold metformin for 24 hours prior to her procedure.  She will take Plavix the morning of her procedure.  I also advised her to hold her hydrochlorothiazide the morning of her procedure. Will update CBC, BMET. LHC has been scheduled for 05/03/2023 with Dr. [place].  Continue Plavix, hydrochlorothiazide, lisinopril, metoprolol, Imdur, Lipitor, and Zetia.   Shared Decision Making/Informed Consent The risks [stroke (1 in 1000), death (1 in 1000), kidney failure [usually temporary] (1 in 500), bleeding (1 in 200), allergic reaction [possibly serious] (1 in 200)], benefits (diagnostic support and management of coronary artery disease) and alternatives of a cardiac catheterization were discussed in detail with Ms. Tritsch and she is willing to proceed.  2. Hypertension: BP well controlled.  Continue to monitor with increased Imdur as above.  Continue current antihypertensive regimen.    3. Hyperlipidemia: LDL was 65 in 11/2022.  Continue Lipitor, Zetia.    4. Type 2 diabetes: A1c was > 14.0 in 05/2022.  She does have some peripheral neuropathy.  Following with endocrinology.   5. OSA: Adherent  to CPAP.  Denies any concerns.   6. Disposition: Follow-up 2 weeks post cath.       Joylene Grapes, NP 04/28/2023, 9:52 AM

## 2023-04-29 LAB — BASIC METABOLIC PANEL
BUN/Creatinine Ratio: 27 — ABNORMAL HIGH (ref 9–23)
Calcium: 9.3 mg/dL (ref 8.7–10.2)
Glucose: 301 mg/dL — ABNORMAL HIGH (ref 70–99)
Potassium: 4.4 mmol/L (ref 3.5–5.2)
eGFR: 78 mL/min/{1.73_m2} (ref >59)

## 2023-04-29 LAB — CBC
Hematocrit: 30.1 % — ABNORMAL LOW (ref 34.0–46.6)
Hemoglobin: 9.3 g/dL — ABNORMAL LOW (ref 11.1–15.9)
MCH: 28.4 pg (ref 26.6–33.0)
MCHC: 30.9 g/dL — ABNORMAL LOW (ref 31.5–35.7)
MCV: 92 fL (ref 79–97)
Platelets: 491 10*3/uL — ABNORMAL HIGH (ref 150–450)
RBC: 3.28 x10E6/uL — ABNORMAL LOW (ref 3.77–5.28)
RDW: 13.5 % (ref 11.7–15.4)

## 2023-05-02 ENCOUNTER — Other Ambulatory Visit: Payer: Self-pay

## 2023-05-02 ENCOUNTER — Telehealth: Payer: Self-pay | Admitting: *Deleted

## 2023-05-02 NOTE — Telephone Encounter (Signed)
Cardiac Catheterization scheduled at [facility] for: Tuesday May 03, 2023 7:30 AM Arrival time [facility] Main Entrance A at: 5:30 AM  Nothing to eat after midnight prior to procedure, clear liquids until 5 AM day of procedure.  Medication instructions: -Hold:  Insulin-AM of procedure/1/2 usual dose HS prior to procedure  Metformin-day of procedure and 48 hours post procedure  Farxiga-AM of procedure  HCTZ-AM of procedure -Other usual morning medications can be taken with sips of water including aspirin 81 mg and Plavix 75 mg.  Confirmed patient has responsible adult to drive home post procedure and be with patient first 24 hours after arriving home.  Plan to go home the same day, you will only stay overnight if medically necessary.  Reviewed procedure instructions with patient.  04/28/23 Hgb 9.3-see Epic for results and notes. Patient denies any blood in stool or recent bleeding events.

## 2023-05-03 ENCOUNTER — Other Ambulatory Visit (HOSPITAL_COMMUNITY): Payer: Self-pay

## 2023-05-03 ENCOUNTER — Encounter (HOSPITAL_COMMUNITY)
Admission: RE | Disposition: A | Discharge status: Home / Self Care | Payer: Self-pay | Source: Home / Self Care | Attending: Cardiology

## 2023-05-03 ENCOUNTER — Ambulatory Visit (HOSPITAL_COMMUNITY)
Admission: RE | Admit: 2023-05-03 | Discharge: 2023-05-03 | Disposition: A | Discharge status: Home / Self Care | Payer: Commercial Managed Care - HMO | Attending: Cardiology | Admitting: Cardiology

## 2023-05-03 DIAGNOSIS — Z79899 Other long term (current) drug therapy: Secondary | ICD-10-CM | POA: Diagnosis not present

## 2023-05-03 DIAGNOSIS — R0609 Other forms of dyspnea: Secondary | ICD-10-CM | POA: Diagnosis not present

## 2023-05-03 DIAGNOSIS — E785 Hyperlipidemia, unspecified: Secondary | ICD-10-CM | POA: Insufficient documentation

## 2023-05-03 DIAGNOSIS — Z955 Presence of coronary angioplasty implant and graft: Secondary | ICD-10-CM | POA: Diagnosis not present

## 2023-05-03 DIAGNOSIS — G4733 Obstructive sleep apnea (adult) (pediatric): Secondary | ICD-10-CM | POA: Diagnosis not present

## 2023-05-03 DIAGNOSIS — I25118 Atherosclerotic heart disease of native coronary artery with other forms of angina pectoris: Secondary | ICD-10-CM | POA: Insufficient documentation

## 2023-05-03 DIAGNOSIS — I10 Essential (primary) hypertension: Secondary | ICD-10-CM | POA: Diagnosis not present

## 2023-05-03 DIAGNOSIS — Z7984 Long term (current) use of oral hypoglycemic drugs: Secondary | ICD-10-CM | POA: Diagnosis not present

## 2023-05-03 DIAGNOSIS — E1165 Type 2 diabetes mellitus with hyperglycemia: Secondary | ICD-10-CM | POA: Insufficient documentation

## 2023-05-03 DIAGNOSIS — Z794 Long term (current) use of insulin: Secondary | ICD-10-CM | POA: Diagnosis not present

## 2023-05-03 DIAGNOSIS — Z6841 Body Mass Index (BMI) 40.0 and over, adult: Secondary | ICD-10-CM | POA: Insufficient documentation

## 2023-05-03 DIAGNOSIS — E1142 Type 2 diabetes mellitus with diabetic polyneuropathy: Secondary | ICD-10-CM | POA: Diagnosis not present

## 2023-05-03 DIAGNOSIS — K219 Gastro-esophageal reflux disease without esophagitis: Secondary | ICD-10-CM | POA: Diagnosis not present

## 2023-05-03 HISTORY — PX: LEFT HEART CATH AND CORONARY ANGIOGRAPHY: CATH118249

## 2023-05-03 LAB — GLUCOSE, CAPILLARY
Glucose-Capillary: 255 mg/dL — ABNORMAL HIGH (ref 70–99)
Glucose-Capillary: 219 mg/dL — ABNORMAL HIGH (ref 70–99)
Glucose-Capillary: 206 mg/dL — ABNORMAL HIGH (ref 70–99)

## 2023-05-03 SURGERY — LEFT HEART CATH AND CORONARY ANGIOGRAPHY
Anesthesia: LOCAL

## 2023-05-03 MED ORDER — HEPARIN (PORCINE) IN NACL 1000-0.9 UT/500ML-% IV SOLN
INTRAVENOUS | Status: DC | PRN
  Administered 2023-05-03 (×2): 500 mL

## 2023-05-03 MED ORDER — FENTANYL CITRATE (PF) 100 MCG/2ML IJ SOLN
INTRAMUSCULAR | Status: AC
  Filled 2023-05-03: qty 2

## 2023-05-03 MED ORDER — SODIUM CHLORIDE 0.9 % WEIGHT BASED INFUSION
3.0000 mL/kg/h | INTRAVENOUS | Status: AC
  Administered 2023-05-03: 3 mL/kg/h via INTRAVENOUS

## 2023-05-03 MED ORDER — LIDOCAINE HCL (PF) 1 % IJ SOLN
INTRAMUSCULAR | Status: DC | PRN
  Administered 2023-05-03: 10 mL via INTRADERMAL

## 2023-05-03 MED ORDER — SODIUM CHLORIDE 0.9 % IV SOLN: 250.0000 mL | INTRAVENOUS | Status: DC | PRN

## 2023-05-03 MED ORDER — HYDRALAZINE HCL 20 MG/ML IJ SOLN: 10.0000 mg | INTRAMUSCULAR | Status: DC | PRN

## 2023-05-03 MED ORDER — ACETAMINOPHEN 325 MG PO TABS: 650.0000 mg | ORAL_TABLET | ORAL | Status: DC | PRN

## 2023-05-03 MED ORDER — MIDAZOLAM HCL 2 MG/2ML IJ SOLN
INTRAMUSCULAR | Status: DC | PRN
  Administered 2023-05-03: 1 mg via INTRAVENOUS

## 2023-05-03 MED ORDER — SODIUM CHLORIDE 0.9% FLUSH: 3.0000 mL | INTRAVENOUS | Status: DC | PRN

## 2023-05-03 MED ORDER — IOHEXOL 350 MG/ML SOLN
INTRAVENOUS | Status: DC | PRN
  Administered 2023-05-03: 60 mL

## 2023-05-03 MED ORDER — SODIUM CHLORIDE 0.9% FLUSH: 3.0000 mL | Freq: Two times a day (BID) | INTRAVENOUS | Status: DC

## 2023-05-03 MED ORDER — CLOPIDOGREL BISULFATE 75 MG PO TABS: 75.0000 mg | ORAL_TABLET | ORAL | Status: DC

## 2023-05-03 MED ORDER — ASPIRIN 81 MG PO CHEW: 81.0000 mg | CHEWABLE_TABLET | Freq: Once | ORAL | Status: DC

## 2023-05-03 MED ORDER — METFORMIN HCL ER 500 MG PO TB24
500.0000 mg | ORAL_TABLET | Freq: Two times a day (BID) | ORAL | 2 refills | Status: DC
  Filled 2023-05-18: qty 180, 90d supply, fill #0

## 2023-05-03 MED ORDER — LIDOCAINE HCL (PF) 1 % IJ SOLN
INTRAMUSCULAR | Status: AC
  Filled 2023-05-03: qty 30

## 2023-05-03 MED ORDER — ONDANSETRON HCL 4 MG/2ML IJ SOLN: 4.0000 mg | Freq: Four times a day (QID) | INTRAMUSCULAR | Status: DC | PRN

## 2023-05-03 MED ORDER — MIDAZOLAM HCL 2 MG/2ML IJ SOLN
INTRAMUSCULAR | Status: AC
  Filled 2023-05-03: qty 2

## 2023-05-03 MED ORDER — SODIUM CHLORIDE 0.9 % WEIGHT BASED INFUSION: 1.0000 mL/kg/h | INTRAVENOUS | Status: AC

## 2023-05-03 MED ORDER — FENTANYL CITRATE (PF) 100 MCG/2ML IJ SOLN
INTRAMUSCULAR | Status: DC | PRN
  Administered 2023-05-03: 25 ug via INTRAVENOUS

## 2023-05-03 MED ORDER — SODIUM CHLORIDE 0.9 % WEIGHT BASED INFUSION: 1.0000 mL/kg/h | INTRAVENOUS | Status: DC

## 2023-05-03 SURGICAL SUPPLY — 8 items
CATH INFINITI 5FR MULTPACK ANG (CATHETERS) IMPLANT
KIT HEART LEFT (KITS) ×2 IMPLANT
KIT MICROPUNCTURE NIT STIFF (SHEATH) IMPLANT
PACK CARDIAC CATHETERIZATION (CUSTOM PROCEDURE TRAY) ×2 IMPLANT
SHEATH PINNACLE 5F 10CM (SHEATH) IMPLANT
TRANSDUCER W/STOPCOCK (MISCELLANEOUS) ×2 IMPLANT
TUBING CIL FLEX 10 FLL-RA (TUBING) ×2 IMPLANT
WIRE EMERALD 3MM-J .035X150CM (WIRE) IMPLANT

## 2023-05-03 NOTE — Progress Notes (Signed)
Sheath pulled at 0820 by [place] P. EMTP. Vital signs checked and stable before pull. Sheath was aspirated and removed, pressure held. Vitals were checked every 5 minutes. Site covered with Tegaderm and Gause. No hematoma and level zero. Distal pulse palpable. Vital signs are stable post pull. Education given to patient and bedrest is started at 0845.

## 2023-05-03 NOTE — Interval H&P Note (Signed)
History and Physical Interval Note:  05/03/2023 7:21 AM  Cindy [name]  has presented today for surgery, with the diagnosis of CAD , DOE.  The various methods of treatment have been discussed with the patient and family. After consideration of risks, benefits and other options for treatment, the patient has consented to  Procedure(s): LEFT HEART CATH AND CORONARY ANGIOGRAPHY (N/A) as a surgical intervention.  The patient's history has been reviewed, patient examined, no change in status, stable for surgery.  I have reviewed the patient's chart and labs.  Questions were answered to the patient's satisfaction.    Cath Lab Visit (complete for each Cath Lab visit)  Clinical Evaluation Leading to the Procedure:   ACS: No.  Non-ACS:    Anginal Classification: CCS II  Anti-ischemic medical therapy: Maximal Therapy (2 or more classes of medications)  Non-Invasive Test Results: Low-risk stress test findings: cardiac mortality <1%/year  Prior CABG: No previous CABG       Theron Arista [facility] 05/03/2023 7:21 AM

## 2023-05-03 NOTE — Progress Notes (Signed)
Pt ambulated without difficulty or bleeding.   Discharged home with daughter who will drive and stay with pt x 24 hrs 

## 2023-05-03 NOTE — Discharge Instructions (Signed)
Femoral Site Care This sheet gives you information about how to care for yourself after your procedure. Your health care provider may also give you more specific instructions. If you have problems or questions, contact your health care provider. What can I expect after the procedure?  After the procedure, it is common to have: Bruising that usually fades within 1-2 weeks. Tenderness at the site. Follow these instructions at home: Wound care Follow instructions from your health care provider about how to take care of your insertion site. Make sure you: Wash your hands with soap and water before you change your bandage (dressing). If soap and water are not available, use hand sanitizer. Remove your dressing as told by your health care provider. 24 hours Do not take baths, swim, or use a hot tub until your health care provider approves. You may shower 24-48 hours after the procedure or as told by your health care provider. Gently wash the site with plain soap and water. Pat the area dry with a clean towel. Do not rub the site. This may cause bleeding. Do not apply powder or lotion to the site. Keep the site clean and dry. Check your femoral site every day for signs of infection. Check for: Redness, swelling, or pain. Fluid or blood. Warmth. Pus or a bad smell. Activity For the first 2-3 days after your procedure, or as long as directed: Avoid climbing stairs as much as possible. Do not squat. Do not lift anything that is heavier than 10 lb (4.5 kg), or the limit that you are told, until your health care provider says that it is safe. For 5 days Rest as directed. Avoid sitting for a long time without moving. Get up to take short walks every 1-2 hours. Do not drive for 24 hours if you were given a medicine to help you relax (sedative). General instructions Take over-the-counter and prescription medicines only as told by your health care provider. Keep all follow-up visits as told by your  health care provider. This is important. Contact a health care provider if you have: A fever or chills. You have redness, swelling, or pain around your insertion site. Get help right away if: The catheter insertion area swells very fast. You pass out. You suddenly start to sweat or your skin gets clammy. The catheter insertion area is bleeding, and the bleeding does not stop when you hold steady pressure on the area. The area near or just beyond the catheter insertion site becomes pale, cool, tingly, or numb. These symptoms may represent a serious problem that is an emergency. Do not wait to see if the symptoms will go away. Get medical help right away. Call your local emergency services (911 in the [place] Do not drive yourself to the hospital. Summary After the procedure, it is common to have bruising that usually fades within 1-2 weeks. Check your femoral site every day for signs of infection. Do not lift anything that is heavier than 10 lb (4.5 kg), or the limit that you are told, until your health care provider says that it is safe. This information is not intended to replace advice given to you by your health care provider. Make sure you discuss any questions you have with your health care provider. Document Revised: 11/28/2017 Document Reviewed: 11/28/2017 Elsevier Patient Education  2020 [employer]  

## 2023-05-04 ENCOUNTER — Encounter (HOSPITAL_COMMUNITY): Payer: Self-pay | Admitting: Cardiology

## 2023-05-08 ENCOUNTER — Emergency Department (HOSPITAL_COMMUNITY): Payer: Commercial Managed Care - HMO

## 2023-05-08 ENCOUNTER — Telehealth: Payer: Self-pay | Admitting: Cardiology

## 2023-05-08 ENCOUNTER — Emergency Department (HOSPITAL_COMMUNITY)
Admission: EM | Admit: 2023-05-08 | Discharge: 2023-05-08 | Disposition: A | Discharge status: Home / Self Care | Payer: Commercial Managed Care - HMO | Attending: Emergency Medicine | Admitting: Emergency Medicine

## 2023-05-08 ENCOUNTER — Other Ambulatory Visit: Payer: Self-pay

## 2023-05-08 ENCOUNTER — Encounter (HOSPITAL_COMMUNITY): Payer: Self-pay | Admitting: Pharmacy Technician

## 2023-05-08 DIAGNOSIS — R0789 Other chest pain: Secondary | ICD-10-CM | POA: Diagnosis not present

## 2023-05-08 DIAGNOSIS — R202 Paresthesia of skin: Secondary | ICD-10-CM | POA: Insufficient documentation

## 2023-05-08 DIAGNOSIS — R200 Anesthesia of skin: Secondary | ICD-10-CM | POA: Insufficient documentation

## 2023-05-08 DIAGNOSIS — I2510 Atherosclerotic heart disease of native coronary artery without angina pectoris: Secondary | ICD-10-CM | POA: Diagnosis not present

## 2023-05-08 DIAGNOSIS — Z794 Long term (current) use of insulin: Secondary | ICD-10-CM | POA: Insufficient documentation

## 2023-05-08 DIAGNOSIS — R739 Hyperglycemia, unspecified: Secondary | ICD-10-CM | POA: Insufficient documentation

## 2023-05-08 DIAGNOSIS — Z7902 Long term (current) use of antithrombotics/antiplatelets: Secondary | ICD-10-CM | POA: Diagnosis not present

## 2023-05-08 DIAGNOSIS — I10 Essential (primary) hypertension: Secondary | ICD-10-CM | POA: Diagnosis not present

## 2023-05-08 DIAGNOSIS — Z79899 Other long term (current) drug therapy: Secondary | ICD-10-CM | POA: Diagnosis not present

## 2023-05-08 LAB — DIFFERENTIAL
Abs Immature Granulocytes: 0.02 10*3/uL (ref 0.00–0.07)
Basophils Absolute: 0 10*3/uL (ref 0.0–0.1)
Basophils Relative: 0 %
Eosinophils Absolute: 0.1 10*3/uL (ref 0.0–0.5)
Eosinophils Relative: 1 %
Immature Granulocytes: 0 %
Lymphocytes Relative: 29 %
Lymphs Abs: 2.5 10*3/uL (ref 0.7–4.0)
Monocytes Absolute: 0.8 10*3/uL (ref 0.1–1.0)
Monocytes Relative: 9 %
Neutro Abs: 5.2 10*3/uL (ref 1.7–7.7)
Neutrophils Relative %: 61 %

## 2023-05-08 LAB — COMPREHENSIVE METABOLIC PANEL
ALT: 13 U/L (ref 0–44)
AST: 15 U/L (ref 15–41)
Albumin: 3.1 g/dL — ABNORMAL LOW (ref 3.5–5.0)
Alkaline Phosphatase: 85 U/L (ref 38–126)
Anion gap: 9 (ref 5–15)
BUN: 22 mg/dL — ABNORMAL HIGH (ref 6–20)
CO2: 25 mmol/L (ref 22–32)
Calcium: 9 mg/dL (ref 8.9–10.3)
Chloride: 103 mmol/L (ref 98–111)
Creatinine, Ser: 0.97 mg/dL (ref 0.44–1.00)
GFR, Estimated: >60 mL/min (ref >60)
Glucose, Bld: 266 mg/dL — ABNORMAL HIGH (ref 70–99)
Potassium: 4.5 mmol/L (ref 3.5–5.1)
Sodium: 137 mmol/L (ref 135–145)
Total Bilirubin: 0.5 mg/dL (ref 0.3–1.2)
Total Protein: 7.6 g/dL (ref 6.5–8.1)

## 2023-05-08 LAB — I-STAT CHEM 8, ED
BUN: 23 mg/dL — ABNORMAL HIGH (ref 6–20)
Calcium, Ion: 1.16 mmol/L (ref 1.15–1.40)
Chloride: 101 mmol/L (ref 98–111)
Creatinine, Ser: 0.9 mg/dL (ref 0.44–1.00)
Glucose, Bld: 258 mg/dL — ABNORMAL HIGH (ref 70–99)
HCT: 30 % — ABNORMAL LOW (ref 36.0–46.0)
Hemoglobin: 10.2 g/dL — ABNORMAL LOW (ref 12.0–15.0)
Potassium: 4.5 mmol/L (ref 3.5–5.1)
Sodium: 139 mmol/L (ref 135–145)
TCO2: 31 mmol/L (ref 22–32)

## 2023-05-08 LAB — PROTIME-INR
INR: 1.1 (ref 0.8–1.2)
Prothrombin Time: 14.2 seconds (ref 11.4–15.2)

## 2023-05-08 LAB — ETHANOL: Alcohol, Ethyl (B): <10 mg/dL (ref <10)

## 2023-05-08 LAB — CBC
HCT: 30.7 % — ABNORMAL LOW (ref 36.0–46.0)
Hemoglobin: 9.4 g/dL — ABNORMAL LOW (ref 12.0–15.0)
MCH: 28.7 pg (ref 26.0–34.0)
MCHC: 30.6 g/dL (ref 30.0–36.0)
MCV: 93.6 fL (ref 80.0–100.0)
Platelets: 439 10*3/uL — ABNORMAL HIGH (ref 150–400)
RBC: 3.28 MIL/uL — ABNORMAL LOW (ref 3.87–5.11)
RDW: 14.5 % (ref 11.5–15.5)
WBC: 8.6 10*3/uL (ref 4.0–10.5)
nRBC: 0 % (ref 0.0–0.2)

## 2023-05-08 LAB — APTT: aPTT: 29 seconds (ref 24–36)

## 2023-05-08 LAB — TROPONIN I (HIGH SENSITIVITY): Troponin I (High Sensitivity): 5 ng/L (ref <18)

## 2023-05-08 MED ORDER — SODIUM CHLORIDE 0.9% FLUSH: 3.0000 mL | Freq: Once | INTRAVENOUS | Status: DC

## 2023-05-08 NOTE — Telephone Encounter (Signed)
On call pager line:  CC: Radial cath complication   Patient called reporting right radial cath complication in which patient has felt sudden onset of a knot, clot, numbness and decreased sensation down her right side.  Daughter had called, stating that she already was on her way to the ER and just wanted further instruction on what she needed to do when she arrived.

## 2023-05-08 NOTE — ED Triage Notes (Signed)
Pt here with reports of numbness to R side that she noticed when she woke up this morning with worsening approx 2 hours ago. LKW yesterday at 2330. Pt had a L heart cath on the 4th. Denies chest pain/shob.

## 2023-05-08 NOTE — ED Notes (Signed)
Pt in bed, pt denies pain, pt has no requests at this time, pt awaits MRI

## 2023-05-08 NOTE — ED Notes (Signed)
Pt in bed, pt denies pain, pt states that she is ready to go home, pt verbalized understanding d/c and follow up, pt ambulatory from dpt.   

## 2023-05-08 NOTE — ED Notes (Signed)
Pt back from MRI 

## 2023-05-08 NOTE — ED Provider Notes (Signed)
[facility] EMERGENCY DEPARTMENT AT [facility] Provider Note   CSN: [id number] Arrival date & time: 05/08/23  0907     History {Add pertinent medical, surgical, social history, OB history to HPI:1} Chief Complaint  Patient presents with   Numbness         Cindy [name] is a 57 y.o. female.  57 year old female with a history of CAD status post PCI, hypertension, hyperlipidemia, and stroke with occasional dysarthria who presents to the emergency department with right-sided numbness.  Patient reports that she went to bed at 1130 last night and felt normal.  This morning woke up at approximately 7 AM and has been experiencing numbness on the right side of her body (face, arm, and leg).  No weakness or new slurred speech.  Called her cardiology office who instructed her to come into the emergency department.  No significant headache.  Occasionally has some chest discomfort but had a heart catheterization on 05/03/2023 that did not show any severe disease.  No stents.  Currently takes Plavix but no other blood thinners.       Home Medications Prior to Admission medications   Medication Sig Start Date End Date Taking? Authorizing Provider  acetaminophen (TYLENOL) 325 MG tablet Take 2 tablets (650 mg total) by mouth every 4 (four) hours as needed for headache or mild pain. 07/09/20   Leone Brand, NP  atorvastatin (LIPITOR) 80 MG tablet Take 1 tablet (80 mg total) by mouth daily. 09/14/22   Parke Poisson, MD  Blood Glucose Monitoring Suppl (TRUE METRIX METER) w/Device KIT Use to test blood sugar 2 (two) times daily. 03/04/23   Adron Bene, MD  clopidogrel (PLAVIX) 75 MG tablet Take 1 tablet (75 mg total) by mouth daily. 12/28/22 12/28/23  Joylene Grapes, NP  Continuous Blood Gluc Sensor (FREESTYLE LIBRE 3 SENSOR) MISC Place 1 sensor on the skin every 14 days. Use to check glucose continuously 06/10/22   Nooruddin, Jason Fila, MD  dorzolamide-timolol (COSOPT) 2-0.5 % ophthalmic  solution Place 1 drop into both eyes 2 (two) times daily. 04/27/23   [provider]  ezetimibe (ZETIA) 10 MG tablet Take 1 tablet (10 mg total) by mouth daily. 12/28/22   Monge, Petra Kuba, NP  FARXIGA 10 MG TABS tablet Take 1 tablet (10 mg total) by mouth daily. 03/02/23   Adron Bene, MD  glucose blood test strip Use as instructed to test blood sugar 2 times daily 03/04/23   Adron Bene, MD  hydrochlorothiazide (HYDRODIURIL) 25 MG tablet Take 1/2 tablet (12.5 mg total) by mouth daily. 04/28/23   Joylene Grapes, NP  insulin glargine (LANTUS SOLOSTAR) 100 UNIT/ML Solostar Pen Inject 40 Units into the skin daily. 04/28/23 05/28/23  Nooruddin, Jason Fila, MD  Insulin Pen Needle (PEN NEEDLES) 32G X 4 MM MISC Use as directed 03/04/23   Adron Bene, MD  Insulin Syringes, Disposable, U-100 0.5 ML MISC 30 Units by Does not apply route 2 (two) times daily with a meal. 08/20/18   Lanelle Bal, MD  isosorbide mononitrate (IMDUR) 60 MG 24 hr tablet Take 1 tablet (60 mg total) by mouth daily. 04/28/23   Joylene Grapes, NP  lisinopril (ZESTRIL) 40 MG tablet Take 1 tablet (40 mg total) by mouth daily. 04/28/23   Joylene Grapes, NP  metFORMIN (GLUCOPHAGE-XR) 500 MG 24 hr tablet Take 1 tablet (500 mg total) by mouth 2 (two) times daily with a meal. 05/06/23   [place], Peter M, MD  metoprolol succinate (TOPROL-XL)  25 MG 24 hr tablet Take 1 tablet (25 mg total) by mouth daily. 12/28/22   Joylene Grapes, NP  nitroGLYCERIN (NITROSTAT) 0.4 MG SL tablet Place 1 tablet (0.4 mg total) under the tongue every 5 (five) minutes as needed for chest pain. 12/28/22   Joylene Grapes, NP  pantoprazole (PROTONIX) 20 MG tablet Take 1 tablet (20 mg total) by mouth daily. 03/22/23   Joylene Grapes, NP  sertraline (ZOLOFT) 50 MG tablet Take 1 tablet (50 mg total) by mouth at bedtime. 12/27/18   Ihor Austin, NP  TRUEplus Lancets 28G MISC Use to test blood sugar 2 (two) times daily. 03/04/23   Adron Bene, MD      Allergies    Other     Review of Systems   Review of Systems  Physical Exam Updated Vital Signs BP 136/69 (BP Location: Right Arm)   Pulse 70   Temp 97.9 F (36.6 C)   Resp 16   LMP 08/11/2016   SpO2 98%  Physical Exam  ED Results / Procedures / Treatments   Labs (all labs ordered are listed, but only abnormal results are displayed) Labs Reviewed  CBC - Abnormal; Notable for the following components:      Result Value   RBC 3.28 (*)    Hemoglobin 9.4 (*)    HCT 30.7 (*)    Platelets 439 (*)    All other components within normal limits  COMPREHENSIVE METABOLIC PANEL - Abnormal; Notable for the following components:   Glucose, Bld 266 (*)    BUN 22 (*)    Albumin 3.1 (*)    All other components within normal limits  I-STAT CHEM 8, ED - Abnormal; Notable for the following components:   BUN 23 (*)    Glucose, Bld 258 (*)    Hemoglobin 10.2 (*)    HCT 30.0 (*)    All other components within normal limits  PROTIME-INR  APTT  DIFFERENTIAL  ETHANOL  CBG MONITORING, ED    EKG EKG Interpretation  Date/Time:  Sunday May 08 2023 09:31:20 EDT Ventricular Rate:  70 PR Interval:  128 QRS Duration: 138 QT Interval:  452 QTC Calculation: 488 R Axis:   -14 Text Interpretation: Normal sinus rhythm Right bundle branch block Abnormal ECG When compared with ECG of 17-Feb-2023 02:03, PREVIOUS ECG IS PRESENT Confirmed by Vonita Moss [phone]) on 05/08/2023 11:49:26 AM  Radiology CT HEAD WO CONTRAST  Result Date: 05/08/2023 CLINICAL DATA:  Neuro deficit.  Numbness on right side. EXAM: CT HEAD WITHOUT CONTRAST TECHNIQUE: Contiguous axial images were obtained from the base of the skull through the vertex without intravenous contrast. RADIATION DOSE REDUCTION: This exam was performed according to the departmental dose-optimization program which includes automated exposure control, adjustment of the mA and/or kV according to patient size and/or use of iterative reconstruction technique. COMPARISON:   08/19/2018 FINDINGS: Brain: No evidence of acute infarction, hemorrhage, hydrocephalus, extra-axial collection or mass lesion/mass effect. Asymmetric low-density extending from the right posterior basal ganglia into the centrum semiovale, image 20/3. This is a new finding compared with 08/19/2018. Remote appearing lacunar infarct within the right posterior putamen is noted measuring 7 mm, image 17/3. This also appears new from the previous exam. Vascular: No hyperdense vessel or unexpected calcification. Skull: Normal. Negative for fracture or focal lesion. Sinuses/Orbits: No acute finding. Other: None. IMPRESSION: 1. No acute intracranial abnormalities. 2. Asymmetric low-density extending from the right posterior basal ganglia into the centrum semiovale is a new finding  compared with 08/19/2018. This is favored to represent sequelae of chronic small vessel ischemic disease. Electronically Signed   By: Signa Kell M.D.   On: 05/08/2023 10:16    Procedures Procedures  {Document cardiac monitor, telemetry assessment procedure when appropriate:1}  Medications Ordered in ED Medications  sodium chloride flush (NS) 0.9 % injection 3 mL (3 mLs Intravenous Not Given 05/08/23 1109)    ED Course/ Medical Decision Making/ A&P   {   Click here for ABCD2, HEART and other calculatorsREFRESH Note before signing :1}                          Medical Decision Making Amount and/or Complexity of Data Reviewed Labs: ordered. Radiology: ordered.   ***  {Document critical care time when appropriate:1} {Document review of labs and clinical decision tools ie heart score, Chads2Vasc2 etc:1}  {Document your independent review of radiology images, and any outside records:1} {Document your discussion with family members, caretakers, and with consultants:1} {Document social determinants of health affecting pt's care:1} {Document your decision making why or why not admission, treatments were needed:1} Final Clinical  Impression(s) / ED Diagnoses Final diagnoses:  None    Rx / DC Orders ED Discharge Orders     None

## 2023-05-08 NOTE — Discharge Instructions (Signed)
You were seen for your numbness in the emergency department.   Follow-up with your primary doctor in 2-3 days regarding your visit.  Follow-up with your neurologist regarding your numbness.   Return immediately to the emergency department if you experience any of the following: chest pain, new numbness or weakness, or any other concerning symptoms.    Thank you for visiting our Emergency Department. It was a pleasure taking care of you today.

## 2023-05-13 ENCOUNTER — Telehealth: Payer: Self-pay | Admitting: *Deleted

## 2023-05-13 ENCOUNTER — Ambulatory Visit (INDEPENDENT_AMBULATORY_CARE_PROVIDER_SITE_OTHER): Payer: Commercial Managed Care - HMO | Admitting: Student

## 2023-05-13 DIAGNOSIS — L299 Pruritus, unspecified: Secondary | ICD-10-CM

## 2023-05-13 HISTORY — DX: Pruritus, unspecified: L29.9

## 2023-05-13 NOTE — Telephone Encounter (Signed)
Reviewed chart agree no contrast on ED visit, would have had contrast with left heart cath on 6/4 but that was 10 days ago and would not suspect a delayed reaction to the contrast.  Given lack of red flag symptoms would be ok to have telehealth visit this afternoon with Dr Austin Miles to see what may be causing her symptoms.

## 2023-05-13 NOTE — Telephone Encounter (Signed)
Patient called in stating she was just seen in ED (6/9) for one sided numbness. Had CT and MRI done. Now c/o itching sensation on arms, face, and back. Denies visible rash, SHOB, throat symptoms, weakness. She thinks it may be r/t contrast from imaging, however, both procedures state w/o contrast. Tele appt given for this afternoon.

## 2023-05-13 NOTE — Progress Notes (Signed)
  [facility] Residency Telephone Encounter Continuity Care Appointment  HPI:  This telephone encounter was created for [name] on 05/13/2023 for the following purpose/cc: itching all over. Verified identity via DOB and home address. Patient complaining of generalized pruritus, predominantly in her arms, face, and back. Feels like something is biting her and making her itch. Itching began about 1 week ago, a couple of days after her LHC on 6/4. She denies any SHOB, N/V, chest pain, palpitations, diarrhea, rashes/hives, weakness, throat tightness. She denies any changes to her home products (no new detergents, body washes, perfumes, etc). She used OTC cortizone-10 cream yesterday night and her itching improved significantly today, no longer having intense itching. It appears that she had an allergic reaction, but unclear etiology. It is possible to have a slightly-delayed reaction to contrast but she has had contrast images in the past without any adverse effects. This may possibly just be an allergic/irritant contact dermatitis. I advised for her to use Zyrtec daily and benadryl at bedtime prn over the weekend. Discussed calling back to [facility] should her symptoms persist next week. Also provided strict return precautions to go to ED should she have any other systems involved (as noted above).   Past Medical History:  Past Medical History:  Diagnosis Date   CAD in native artery 07/09/2020   Coronary artery disease    Diabetes mellitus    GERD (gastroesophageal reflux disease)    Hyperlipemia    Hypertension    S/P angioplasty with stent 07/09/2020   Stroke (HCC)      ROS:  Negative aside from that in HPI.   Assessment / Plan / Recommendations:  Please see A&P under problem oriented charting for assessment of the patient's acute and chronic medical conditions.  As always, pt is advised that if symptoms worsen or new symptoms arise, they should go to an urgent care  facility or to to ER for further evaluation.   Consent and Medical Decision Making:  Patient discussed with Dr. Cleda Daub This is a telephone encounter between Cindy [name] and Merrilyn Puma on 05/13/2023 for generalized pruritis. The visit was conducted with the patient located at home and Merrilyn Puma at [facility]. The patient's identity was confirmed using their DOB and current address. The patient has consented to being evaluated through a telephone encounter and understands the associated risks (an examination cannot be done and the patient may need to come in for an appointment) / benefits (allows the patient to remain at home, decreasing exposure to coronavirus). I personally spent 16 minutes on medical discussion.

## 2023-05-13 NOTE — Assessment & Plan Note (Signed)
Patient complaining of generalized pruritus, predominantly in her arms, face, and back. Feels like something is biting her and making her itch. Itching began about 1 week ago, a couple of days after her LHC on 6/4. She denies any SHOB, N/V, chest pain, palpitations, diarrhea, rashes/hives, weakness, throat tightness. She denies any changes to her home products (no new detergents, body washes, perfumes, etc). She used OTC cortizone-10 cream yesterday night and her itching improved significantly today, no longer having intense itching.   It appears that she had an allergic reaction, but unclear etiology. It is possible to have a slightly-delayed reaction to contrast but she has had contrast images in the past without any adverse effects. This may possibly just be an allergic/irritant contact dermatitis.   I advised for her to use Zyrtec daily and benadryl at bedtime prn over the weekend. Discussed calling back to [facility] should her symptoms persist next week. Also provided strict return precautions to go to ED should she have any other systems involved (as noted above).  Plan: -OTC zyrtec daily -benadryl at bedtime prn -strict return precautions

## 2023-05-19 ENCOUNTER — Other Ambulatory Visit: Payer: Self-pay

## 2023-05-19 ENCOUNTER — Other Ambulatory Visit (HOSPITAL_COMMUNITY): Payer: Self-pay

## 2023-05-20 ENCOUNTER — Encounter: Payer: Self-pay | Admitting: Nurse Practitioner

## 2023-05-20 ENCOUNTER — Ambulatory Visit: Payer: Commercial Managed Care - HMO | Attending: Nurse Practitioner | Admitting: Nurse Practitioner

## 2023-05-20 VITALS — BP 100/62 | HR 77 | Ht 63.0 in | Wt 235.2 lb

## 2023-05-20 DIAGNOSIS — R0609 Other forms of dyspnea: Secondary | ICD-10-CM | POA: Diagnosis not present

## 2023-05-20 DIAGNOSIS — E0822 Diabetes mellitus due to underlying condition with diabetic chronic kidney disease: Secondary | ICD-10-CM

## 2023-05-20 DIAGNOSIS — Z794 Long term (current) use of insulin: Secondary | ICD-10-CM

## 2023-05-20 DIAGNOSIS — E785 Hyperlipidemia, unspecified: Secondary | ICD-10-CM

## 2023-05-20 DIAGNOSIS — G4733 Obstructive sleep apnea (adult) (pediatric): Secondary | ICD-10-CM

## 2023-05-20 DIAGNOSIS — I2510 Atherosclerotic heart disease of native coronary artery without angina pectoris: Secondary | ICD-10-CM | POA: Diagnosis not present

## 2023-05-20 DIAGNOSIS — I10 Essential (primary) hypertension: Secondary | ICD-10-CM

## 2023-05-20 DIAGNOSIS — Z7984 Long term (current) use of oral hypoglycemic drugs: Secondary | ICD-10-CM

## 2023-05-20 NOTE — Patient Instructions (Signed)
Medication Instructions:  Your physician recommends that you continue on your current medications as directed. Please refer to the Current Medication list given to you today.  *If you need a refill on your cardiac medications before your next appointment, please call your pharmacy*   Lab Work: NONE ordered at this time of appointment    Testing/Procedures: NONE ordered at this time of appointment     Follow-Up: At [facility], you and your health needs are our priority.  As part of our continuing mission to provide you with exceptional heart care, we have created designated Provider Care Teams.  These Care Teams include your primary Cardiologist (physician) and Advanced Practice Providers (APPs -  Physician Assistants and Nurse Practitioners) who all work together to provide you with the care you need, when you need it.  We recommend signing up for the patient portal called "MyChart".  Sign up information is provided on this After Visit Summary.  MyChart is used to connect with patients for Virtual Visits (Telemedicine).  Patients are able to view lab/test results, encounter notes, upcoming appointments, etc.  Non-urgent messages can be sent to your provider as well.   To learn more about what you can do with MyChart, go to [email or web address].    Your next appointment:    Keep follow up   Provider:   Parke Poisson, MD     Other Instructions NONE ordered at this time of appointment

## 2023-05-20 NOTE — Progress Notes (Signed)
Office Visit    Patient Name: Cindy [name] Date of Encounter: 05/20/2023  Primary Care Provider:  Olegario Messier, MD Primary Cardiologist:  Parke Poisson, MD  Chief Complaint    57 year old female with a history of CAD s/p DES x 2-LAD in 2021, hypertension, hyperlipidemia, type 2 diabetes, OSA, and GERD who presents for follow-up related to CAD.    Past Medical History    Past Medical History:  Diagnosis Date   CAD in native artery 07/09/2020   Coronary artery disease    Diabetes mellitus    GERD (gastroesophageal reflux disease)    Hyperlipemia    Hypertension    S/P angioplasty with stent 07/09/2020   Stroke [facility])    Past Surgical History:  Procedure Laterality Date   CARDIAC CATHETERIZATION     CORONARY STENT INTERVENTION N/A 07/08/2020   Procedure: CORONARY STENT INTERVENTION;  Surgeon: Corky Crafts, MD;  Location: [facility] INVASIVE CV LAB;  Service: Cardiovascular;  Laterality: N/A;   CORONARY ULTRASOUND/IVUS N/A 07/08/2020   Procedure: Intravascular Ultrasound/IVUS;  Surgeon: Corky Crafts, MD;  Location: [facility] INVASIVE CV LAB;  Service: Cardiovascular;  Laterality: N/A;   LEFT HEART CATH AND CORONARY ANGIOGRAPHY N/A 07/07/2020   Procedure: LEFT HEART CATH AND CORONARY ANGIOGRAPHY;  Surgeon: Runell Gess, MD;  Location: [facility] INVASIVE CV LAB;  Service: Cardiovascular;  Laterality: N/A;   LEFT HEART CATH AND CORONARY ANGIOGRAPHY N/A 05/03/2023   Procedure: LEFT HEART CATH AND CORONARY ANGIOGRAPHY;  Surgeon: [place], Peter M, MD;  Location: [facility] INVASIVE CV LAB;  Service: Cardiovascular;  Laterality: N/A;   None      Allergies  Allergies  Allergen Reactions   Other     Hives to an unknown medication given in 2012 at [facility]      Labs/Other Studies Reviewed    The following studies were reviewed today:  Cardiac Studies & Procedures   CARDIAC CATHETERIZATION  CARDIAC CATHETERIZATION 05/03/2023  Narrative   Prox RCA-1 lesion is 40% stenosed.    Prox RCA-2 lesion is 40% stenosed.   Dist RCA lesion is 50% stenosed.   Prox LAD to Mid LAD lesion is 20% stenosed.   Prox Cx to Dist Cx lesion is 25% stenosed.   Previously placed Mid LAD to Dist LAD stent of unknown type is  widely patent.   LV end diastolic pressure is normal.  Nonobstructive CAD. Continued excellent patency of stents in the LAD Normal LVEDP 13 mm Hg  Plan: continued medical therapy. Symptoms may be related to microvascular disease due to poorly controlled DM.  Findings Coronary Findings Diagnostic  Dominance: Right  Left Anterior Descending Prox LAD to Mid LAD lesion is 20% stenosed. The lesion was previously treated using a drug eluting stent over 2 years ago. Previously placed Mid LAD to Dist LAD stent of unknown type is  widely patent.  Left Circumflex Prox Cx to Dist Cx lesion is 25% stenosed.  Right Coronary Artery Prox RCA-1 lesion is 40% stenosed. Prox RCA-2 lesion is 40% stenosed. Dist RCA lesion is 50% stenosed. The lesion is segmental.  Intervention  No interventions have been documented.   CARDIAC CATHETERIZATION  CARDIAC CATHETERIZATION 07/08/2020  Narrative  Dist LAD lesion is 80% stenosed.  A drug-eluting stent was successfully placed using a STENT RESOLUTE ONYX 2.5X18, postdilated with a 3.0 [place] balloon and optimized wth IVUS.  Post intervention, there is a 0% residual stenosis.  Mid LAD lesion is 80% stenosed. Minimal calcification by IVUS so atherectomy  was not required.  A drug-eluting stent was successfully placed using a STENT RESOLUTE ONYX 3.5X22, postdilated with a 4.0 [place] balloon and optimized wth IVUS.  Post intervention, there is a 0% residual stenosis.  Continue aggressive secondary prevention.  I stressed the importance of DAPT.  Findings Coronary Findings Diagnostic  Dominance: Co-dominant  Left Anterior Descending Mid LAD lesion is 80% stenosed. The lesion is calcified. Ultrasound (IVUS) was performed. Severe  plaque burden was detected. The lesion has a fibro-fatty core and a necrotic core. not much calcium noted by IVUS. Dist LAD lesion is 80% stenosed.  Intervention  Mid LAD lesion Stent CATH LAUNCHER 6FR EBU 3.75 guide catheter was inserted. Lesion crossed with guidewire using a WIRE ASAHI PROWATER 180CM. Pre-stent angioplasty was performed using a BALLOON SAPPHIRE 2.5X15. A drug-eluting stent was successfully placed using a STENT RESOLUTE ONYX 3.5X22. Stent strut is well apposed. Post-stent angioplasty was performed using a BALLOON SAPPHIRE [place] 4.0X12. IVUS was used to optimize the stent size, and performed before and after stenting with 5 Fr Opticross catheter.  Initially, there was plan for atherecetomy but given that there was not much calcium, we performed balloon and stenting. Post-Intervention Lesion Assessment The intervention was successful. Pre-interventional TIMI flow is 3. Post-intervention TIMI flow is 3. No complications occurred at this lesion. There is a 0% residual stenosis post intervention.  Dist LAD lesion Stent CATH LAUNCHER 6FR EBU 3.75 guide catheter was inserted. Lesion crossed with guidewire using a WIRE ASAHI PROWATER 180CM. Pre-stent angioplasty was performed using a BALLOON SAPPHIRE 2.5X15. A drug-eluting stent was successfully placed using a STENT RESOLUTE ONYX 2.5X18. Stent strut is well apposed. Post-stent angioplasty was performed using a BALLOON SAPPHIRE [place] 3.0X12. IVUS did not cross initially.  IVUS was done after stent placement due to haziness at distal edge of stent, but there was no evidence of edge dissection. Post-Intervention Lesion Assessment The intervention was successful. Pre-interventional TIMI flow is 3. Post-intervention TIMI flow is 3. No complications occurred at this lesion. There is a 0% residual stenosis post intervention.   STRESS TESTS  NM PET CT CARDIAC PERFUSION MULTI W/ABSOLUTE BLOODFLOW 04/20/2023  Narrative   Normal perfusion. No  increase in LVEF with stress. No TID. MBF abnormal, 1.25. Findings are either related to obstructive CAD or microvascular disease given the patient's poorly controlled diabetes.   LV perfusion is normal. There is no evidence of ischemia. There is no evidence of infarction.   Rest left ventricular function is normal. Rest EF: 72 %. Stress left ventricular function is normal. Stress EF: 72 %. End diastolic cavity size is normal.   Myocardial blood flow was computed to be 1.76ml/g/min at rest and 1.6ml/g/min at stress. Global myocardial blood flow reserve was 1.25 and was abnormal.   Coronary calcium assessment not performed due to prior revascularization.   The study is normal. The study is low risk.  Electronically signed by Lennie Odor, MD _____________________________________________________________________________________________________  Francia Greaves: OVER-READ INTERPRETATION  PET-CT CHEST  The following report is an over-read performed by radiologist Dr. Leatha [facility] on 04/20/2023. This over-read does not include interpretation of cardiac or coronary anatomy or pathology. The cardiac PET and cardiac CT interpretation by the cardiologist is to be attached.  COMPARISON:  None.  FINDINGS: No evidence for lymphadenopathy within the visualized mediastinum or hilar regions.  Patchy ground-glass opacity in the dependent lung bases is probably atelectatic. No pleural effusion.  Visualized portions of the upper abdomen are unremarkable.  No suspicious lytic or  sclerotic osseous abnormality.  IMPRESSION: No acute or clinically significant extracardiac findings.   Electronically Signed By: Kennith Center M.D. On: 04/20/2023 11:28   ECHOCARDIOGRAM  ECHOCARDIOGRAM COMPLETE 07/07/2020  Narrative ECHOCARDIOGRAM REPORT    Patient Name:   JOSCLYN ROSALES Megna Date of Exam: 07/07/2020 Medical Rec #:  [id number]          Height:       63.0 in Accession #:     [id number]         Weight:       250.2 lb Date of Birth:  1966/10/27         BSA:          2.127 m Patient Age:    53 years           BP:           144/68 mmHg Patient Gender: F                  HR:           70 bpm. Exam Location:  Inpatient  Procedure: 2D Echo  Indications:    chest pain 786.50  History:        Patient has prior history of Echocardiogram examinations, most recent 08/19/2018. Risk Factors:Diabetes, Dyslipidemia and Hypertension.  Sonographer:    Delcie Roch Referring Phys: [id number] CALLIE E GOODRICH  IMPRESSIONS   1. Left ventricular ejection fraction, by estimation, is 60 to 65%. The left ventricle has normal function. The left ventricle has no regional wall motion abnormalities. There is mild concentric left ventricular hypertrophy. Left ventricular diastolic parameters were normal. 2. Right ventricular systolic function is normal. The right ventricular size is normal. Tricuspid regurgitation signal is inadequate for assessing PA pressure. 3. The mitral valve is grossly normal. Trivial mitral valve regurgitation. No evidence of mitral stenosis. 4. The aortic valve is tricuspid. Aortic valve regurgitation is not visualized. No aortic stenosis is present. 5. The inferior vena cava is normal in size with greater than 50% respiratory variability, suggesting right atrial pressure of 3 mmHg.  FINDINGS Left Ventricle: Left ventricular ejection fraction, by estimation, is 60 to 65%. The left ventricle has normal function. The left ventricle has no regional wall motion abnormalities. The left ventricular internal cavity size was normal in size. There is mild concentric left ventricular hypertrophy. Left ventricular diastolic parameters were normal. Normal left ventricular filling pressure.  Right Ventricle: The right ventricular size is normal. No increase in right ventricular wall thickness. Right ventricular systolic function is normal. Tricuspid regurgitation signal  is inadequate for assessing PA pressure.  Left Atrium: Left atrial size was normal in size.  Right Atrium: Right atrial size was normal in size.  Pericardium: Trivial pericardial effusion is present. Presence of pericardial fat pad.  Mitral Valve: The mitral valve is grossly normal. Trivial mitral valve regurgitation. No evidence of mitral valve stenosis.  Tricuspid Valve: The tricuspid valve is grossly normal. Tricuspid valve regurgitation is trivial. No evidence of tricuspid stenosis.  Aortic Valve: The aortic valve is tricuspid. Aortic valve regurgitation is not visualized. No aortic stenosis is present.  Pulmonic Valve: The pulmonic valve was grossly normal. Pulmonic valve regurgitation is not visualized.  Aorta: The aortic root and ascending aorta are structurally normal, with no evidence of dilitation.  Venous: The inferior vena cava is normal in size with greater than 50% respiratory variability, suggesting right atrial pressure of 3 mmHg.  IAS/Shunts: The atrial septum is grossly normal.   LEFT  VENTRICLE PLAX 2D LVIDd:         4.00 cm  Diastology LVIDs:         2.60 cm  LV e' lateral:   7.40 cm/s LV PW:         1.20 cm  LV E/e' lateral: 14.5 LV IVS:        1.20 cm  LV e' medial:    7.29 cm/s LVOT diam:     2.00 cm  LV E/e' medial:  14.7 LV SV:         75 LV SV Index:   35 LVOT Area:     3.14 cm   RIGHT VENTRICLE             IVC RV S prime:     11.30 cm/s  IVC diam: 2.00 cm TAPSE (M-mode): 2.6 cm  LEFT ATRIUM             Index       RIGHT ATRIUM           Index LA diam:        4.00 cm 1.88 cm/m  RA Area:     13.00 cm LA Vol (A2C):   42.6 ml 20.03 ml/m RA Volume:   29.50 ml  13.87 ml/m LA Vol (A4C):   52.7 ml 24.78 ml/m LA Biplane Vol: 50.2 ml 23.60 ml/m AORTIC VALVE LVOT Vmax:   96.70 cm/s LVOT Vmean:  62.700 cm/s LVOT VTI:    0.240 m  AORTA Ao Root diam: 2.70 cm Ao Asc diam:  3.10 cm  MITRAL VALVE MV Area (PHT): 3.31 cm     SHUNTS MV Decel Time:  229 msec     Systemic VTI:  0.24 m MV E velocity: 107.00 cm/s  Systemic Diam: 2.00 cm MV A velocity: 101.00 cm/s MV E/A ratio:  1.06  Lennie Odor MD Electronically signed by Lennie Odor MD Signature Date/Time: 07/07/2020/3:27:27 PM    Final            Recent Labs: 02/17/2023: B Natriuretic Peptide 10.7 05/08/2023: ALT 13; BUN 23; Creatinine, Ser 0.90; Hemoglobin 10.2; Platelets 439; Potassium 4.5; Sodium 139  Recent Lipid Panel    Component Value Date/Time   CHOL 129 12/28/2022 1130   TRIG 113 12/28/2022 1130   HDL 43 12/28/2022 1130   CHOLHDL 3.0 12/28/2022 1130   CHOLHDL 6.1 07/07/2020 0118   VLDL 30 07/07/2020 0118   LDLCALC 65 12/28/2022 1130    History of Present Illness    57 year old female with the above past medical history including CAD s/p DES x 2-LAD in 2021, hypertension, hyperlipidemia, type 2 diabetes, OSA, and GERD.   Echocardiogram in 06/2020 showed EF 60 to 65%, normal LV function, no RWMA, mild concentric LVH, normal RV systolic function, no significant valvular abnormalities.  At her follow-up visit in 08/2022 she noted rare intermittent chest discomfort.  She was started on low-dose Imdur.  DAPT was transitioned to Plavix monotherapy. She presented to the ED on 02/17/2023 with chest pain.  Troponin was flat.  Cardiology was consulted.  She underwent outpatient [facility] which suggested normal perfusion however, there was evidence of transient ischemic dilation.  It was noted that should she have recurrent chest pain, she would likely benefit from further ischemic evaluation with cardiac PET stress test or cardiac catheterization.  She was last seen in the office on 03/04/2023 and was stable overall from a cardiac standpoint.  She denies any recurrent chest pain, however, she did  note significant dyspnea on exertion, similar to prior anginal equivalent.  Cardiac PET stress test revealed overall normal perfusion, no evidence of ischemia or infarction, however,  myocardial blood flow was abnormal.  Follow-up cardiac catheterization was recommended to further define coronary anatomy given ongoing symptoms and uncontrolled diabetes.  She was last seen in the office on 04/28/2023 and was stable overall from a cardiac standpoint.  She continued to note dyspnea with minimal exertion, bilateral lower extremity edema.  She underwent cardiac catheterization on 05/03/2023 which revealed 40%-50% RCA stenosis, 20% proximal and mid LAD stenosis, 25% LCx stenosis, patent LAD stents, nonobstructive CAD.  Ongoing medical therapy was advised.  She presented to the ED on 05/08/2023 with right-sided numbness.  CT/MRI were unremarkable.  EKG and troponins were normal.  She was discharged home in stable condition and advised to follow-up with her PCP, cardiology, and neurology as an outpatient.  She also noted generalized pruritus.  She was evaluated by her PCP and this was felt to be an allergic reaction to an irritant.   She presents today for follow-up accompanied by her daughter.  Since her last visit and since he procedure she has done well from a cardiac standpoint. Since increasing her Imdur she has had no further chest pain or dyspnea on exertion.  BP has been stable.  Overall, she reports feeling well.  Home Medications    Current Outpatient Medications  Medication Sig Dispense Refill   acetaminophen (TYLENOL) 325 MG tablet Take 2 tablets (650 mg total) by mouth every 4 (four) hours as needed for headache or mild pain.     atorvastatin (LIPITOR) 80 MG tablet Take 1 tablet (80 mg total) by mouth daily. 90 tablet 3   Blood Glucose Monitoring Suppl (TRUE METRIX METER) w/Device KIT Use to test blood sugar 2 (two) times daily. 1 kit 0   clopidogrel (PLAVIX) 75 MG tablet Take 1 tablet (75 mg total) by mouth daily. 30 tablet 11   Continuous Blood Gluc Sensor (FREESTYLE LIBRE 3 SENSOR) MISC Place 1 sensor on the skin every 14 days. Use to check glucose continuously 2 each 2    dorzolamide-timolol (COSOPT) 2-0.5 % ophthalmic solution Place 1 drop into both eyes 2 (two) times daily.     ezetimibe (ZETIA) 10 MG tablet Take 1 tablet (10 mg total) by mouth daily. 90 tablet 3   FARXIGA 10 MG TABS tablet Take 1 tablet (10 mg total) by mouth daily. 30 tablet 6   glucose blood test strip Use as instructed to test blood sugar 2 times daily 100 each 12   hydrochlorothiazide (HYDRODIURIL) 25 MG tablet Take 1/2 tablet (12.5 mg total) by mouth daily. 45 tablet 3   insulin glargine (LANTUS SOLOSTAR) 100 UNIT/ML Solostar Pen Inject 40 Units into the skin daily. 12 mL 0   Insulin Pen Needle (PEN NEEDLES) 32G X 4 MM MISC Use as directed 100 each 0   Insulin Syringes, Disposable, U-100 0.5 ML MISC 30 Units by Does not apply route 2 (two) times daily with a meal. 100 each 3   isosorbide mononitrate (IMDUR) 60 MG 24 hr tablet Take 1 tablet (60 mg total) by mouth daily. 90 tablet 3   lisinopril (ZESTRIL) 40 MG tablet Take 1 tablet (40 mg total) by mouth daily. 90 tablet 3   metFORMIN (GLUCOPHAGE-XR) 500 MG 24 hr tablet Take 1 tablet (500 mg total) by mouth 2 (two) times daily with a meal. 60 tablet 2   metoprolol succinate (TOPROL-XL) 25 MG  24 hr tablet Take 1 tablet (25 mg total) by mouth daily. 90 tablet 3   nitroGLYCERIN (NITROSTAT) 0.4 MG SL tablet Place 1 tablet (0.4 mg total) under the tongue every 5 (five) minutes as needed for chest pain. 25 tablet 4   pantoprazole (PROTONIX) 20 MG tablet Take 1 tablet (20 mg total) by mouth daily. 90 tablet 2   sertraline (ZOLOFT) 50 MG tablet Take 1 tablet (50 mg total) by mouth at bedtime. 90 tablet 0   TRUEplus Lancets 28G MISC Use to test blood sugar 2 (two) times daily. 100 each 0   No current facility-administered medications for this visit.     Review of Systems    She denies chest pain, palpitations, dyspnea, pnd, orthopnea, n, v, dizziness, syncope, edema, weight gain, or early satiety. All other systems reviewed and are otherwise  negative except as noted above.   Physical Exam    VS:  BP 100/62 (BP Location: Right Arm, Patient Position: Sitting, Cuff Size: Normal)   Pulse 77   Ht 5\' 3"  (1.6 m)   Wt 235 lb 3.2 oz (106.7 kg)   LMP 08/11/2016   SpO2 100%   BMI 41.66 kg/m   GEN: Well nourished, well developed, in no acute distress. HEENT: normal. Neck: Supple, no JVD, carotid bruits, or masses. Cardiac: RRR, no murmurs, rubs, or gallops. No clubbing, cyanosis, edema.  Radials/DP/PT 2+ and equal bilaterally.  Right femoral cath site without bruising bleeding or hematoma. Respiratory:  Respirations regular and unlabored, clear to auscultation bilaterally. GI: Soft, nontender, nondistended, BS + x 4. MS: no deformity or atrophy. Skin: warm and dry, no rash. Neuro:  Strength and sensation are intact. Psych: Normal affect.  Accessory Clinical Findings    ECG personally reviewed by me today - EKG Interpretation  Date/Time:  Friday May 20 2023 13:54:48 EDT Ventricular Rate:  77 PR Interval:  196 QRS Duration: 122 QT Interval:  426 QTC Calculation: 482 R Axis:   -14 Text Interpretation: Normal sinus rhythm Right bundle branch block When compared with ECG of 08-May-2023 09:31, No significant change was found Confirmed by Bernadene Person ([id number]) on 05/20/2023 2:08:22 PM  - no acute changes.   Lab Results  Component Value Date   WBC 8.6 05/08/2023   HGB 10.2 (L) 05/08/2023   HCT 30.0 (L) 05/08/2023   MCV 93.6 05/08/2023   PLT 439 (H) 05/08/2023   Lab Results  Component Value Date   CREATININE 0.90 05/08/2023   BUN 23 (H) 05/08/2023   NA 139 05/08/2023   K 4.5 05/08/2023   CL 101 05/08/2023   CO2 25 05/08/2023   Lab Results  Component Value Date   ALT 13 05/08/2023   AST 15 05/08/2023   ALKPHOS 85 05/08/2023   BILITOT 0.5 05/08/2023   Lab Results  Component Value Date   CHOL 129 12/28/2022   HDL 43 12/28/2022   LDLCALC 65 12/28/2022   TRIG 113 12/28/2022   CHOLHDL 3.0 12/28/2022    Lab Results   Component Value Date   HGBA1C >15.5 (H) 02/17/2023    Assessment & Plan    1. CAD/dyspnea on exertion: S/p DES x 2-LAD in 2021. Lexiscan Myoview in 01/2023 suggested normal perfusion, however, there was evidence of transient ischemic dilation.  Cardiac PET stress test revealed overall normal perfusion, no evidence of ischemia or infarction, however, myocardial blood flow was abnormal. Imdur was increased to 60 mg daily. Follow-up cardiac catheterization on 05/03/2023 revealed 40%-50% RCA stenosis, 20% proximal  and mid LAD stenosis, 25% LCx stenosis, patent LAD stents, nonobstructive CAD.  Ongoing medical therapy was advised. Stable with no anginal symptoms. No indication for ischemic evaluation. Continue Plavix, hydrochlorothiazide, lisinopril, metoprolol, Imdur, Lipitor, and Zetia.  2. Hypertension: BP well controlled. Continue current antihypertensive regimen.    3. Hyperlipidemia: LDL was 65 in 11/2022.  Continue Lipitor, Zetia.    4. Type 2 diabetes: A1c was > 14.0 in 05/2022.  She has stable peripheral neuropathy.  Following with endocrinology.   5. OSA: Adherent to CPAP.  Denies any concerns.   6. Disposition: Follow-up as scheduled with Dr. Jacques Navy in 06/2023.      Joylene Grapes, NP 05/20/2023, 2:26 PM

## 2023-05-23 ENCOUNTER — Other Ambulatory Visit (HOSPITAL_COMMUNITY): Payer: Self-pay

## 2023-05-25 ENCOUNTER — Other Ambulatory Visit (HOSPITAL_COMMUNITY): Payer: Self-pay

## 2023-05-25 ENCOUNTER — Encounter: Payer: Self-pay | Admitting: Dietician

## 2023-05-30 ENCOUNTER — Encounter: Payer: Commercial Managed Care - HMO | Admitting: *Deleted

## 2023-05-30 DIAGNOSIS — Z006 Encounter for examination for normal comparison and control in clinical research program: Secondary | ICD-10-CM

## 2023-05-30 NOTE — Research (Signed)
[facility]  Informed Consent   Subject Name: Cindy [name]  Subject met inclusion and exclusion criteria.  The informed consent form, study requirements and expectations were reviewed with the subject and questions and concerns were addressed prior to the signing of the consent form.  The subject verbalized understanding of the trial requirements.  The subject agreed to participate in the [facility]  trial and signed the informed consent at 09:07  on 05-30-2023.  The informed consent was obtained prior to performance of any protocol-specific procedures for the subject.  A copy of the signed informed consent was given to the subject and a copy was placed in the subject's medical record.   [place] Yareni Creps, Research Coordinator    Are there any labs that are clinically significant?  Yes []  OR No[]     ACCESSION NO. [id number]                                             Page 1 of 2                                                        INVESTIGATOR: ([id number])                          PROTOCOL   [id number]                     Thomasene Ripple, M.D.                              INVESTIGATOR NO.: [id number]                     c/o [facility]                         SUBJECT NUMBER: [id number]                     [facility]                 SUBJECT INITIALS NOT COLLECTED:                     [address]                          VISIT: D0                     [place], [place] [address]                   SPONSOR REPORT TO:                 COLLECTION TIME:09:11 DATE:30-May-2023                     Cruzita Lederer                 DATE RECEIVED IN LABORATORY: 31-May-2023  c/o Sponsor [employer] access         DATE REPORTED BY LABORATORY: 31-May-2023                     [place]                          SEX: F  AGE: 56y                     [id number]                   [place], [place] [phone]                                                                                         Ref. Ranges               Clinical    Comments                                                                          Significance                                                                            Yes*  No                    HEMATOLOGY&DIFFERENTIAL PANEL                   #1 HGB            8.8     L    11.6-16.4 g/dL                      HCT            32      L    34-48 %                   #1 RBC            3.3     L    4.1-5.6 x106/uL                   #1 MCH            27           26-34 pg                   #1 MCHC           27  L    31-38 g/dL                   #1 RDW            15.1    H    12.0-15.0 %                      RBC Morph      No Review Required                        MCV            98           79-98 fL                   #1 WBC            7.55         3.80-10.70 x103/uL                   #1 Neutrophil     4.55         1.96-7.23 x103/uL                      Lymphocyte     2.24         0.91-4.28 x103/uL                   #1 Monocytes      0.67         0.12-0.92 x103/uL                   #1 Eosinophil     0.08         0.00-0.57 x103/uL                   #1 Basophils      0.01         0.00-0.20 x103/uL                   #1 Neutrophil     60.3         40.5-75.0 %                   #1 Lymphocyte     29.7         15.4-48.5%                   #1 Monocytes      8.9          2.6-10.1 %                   #1 Eosinophil     1.0          0.0-6.8 %                   #1 Basophils      0.2          0.0-2.0 %                   #1 Platelets      385          140-400 x103/uL   RETICULOCYTES                      Retic %        2.0  0.6-2.5 %                      Retic Abs      0.065        0.030-0.120 x106/uL     [id number] LPA COLLECTION D/T                      Untimed D      30-May-2023                        Untimed T      09:11                            SM/PLASMA PROTEO & BMS D/T                      Untimed D       30-May-2023                        Untimed T      09:11                            SM/WB DNA COLLECTION D/T                      Untimed D      30-May-2023                        Untimed T      09:11                            SM/WB PAXGENE RNA COLLECT D/T                      Untimed D      30-May-2023                        Untimed T      09:11                            SUBJECT FASTED AT LEAST 9 HRS?                      Pt fast 9h     Yes

## 2023-06-02 ENCOUNTER — Other Ambulatory Visit: Payer: Self-pay | Admitting: Student

## 2023-06-02 DIAGNOSIS — E118 Type 2 diabetes mellitus with unspecified complications: Secondary | ICD-10-CM

## 2023-06-03 MED ORDER — LANTUS SOLOSTAR 100 UNIT/ML ~~LOC~~ SOPN
40.0000 [IU] | PEN_INJECTOR | Freq: Every day | SUBCUTANEOUS | 0 refills | Status: DC
Start: 2023-06-03 — End: 2023-07-04
  Filled 2023-06-03: qty 12, 30d supply, fill #0

## 2023-06-04 ENCOUNTER — Other Ambulatory Visit (HOSPITAL_COMMUNITY): Payer: Self-pay

## 2023-06-06 ENCOUNTER — Other Ambulatory Visit (HOSPITAL_COMMUNITY): Payer: Self-pay

## 2023-06-20 ENCOUNTER — Other Ambulatory Visit (HOSPITAL_COMMUNITY): Payer: Self-pay

## 2023-06-20 MED ORDER — PREDNISOLONE ACETATE 1 % OP SUSP
1.0000 [drp] | Freq: Four times a day (QID) | OPHTHALMIC | 1 refills | Status: DC
  Filled 2023-06-20: qty 10, 25d supply, fill #0
  Filled 2023-07-24: qty 5, 25d supply, fill #1

## 2023-06-22 DIAGNOSIS — E119 Type 2 diabetes mellitus without complications: Secondary | ICD-10-CM | POA: Diagnosis not present

## 2023-06-22 DIAGNOSIS — Z79899 Other long term (current) drug therapy: Secondary | ICD-10-CM | POA: Insufficient documentation

## 2023-06-22 DIAGNOSIS — I10 Essential (primary) hypertension: Secondary | ICD-10-CM | POA: Diagnosis not present

## 2023-06-22 DIAGNOSIS — Z7902 Long term (current) use of antithrombotics/antiplatelets: Secondary | ICD-10-CM | POA: Insufficient documentation

## 2023-06-22 DIAGNOSIS — I2510 Atherosclerotic heart disease of native coronary artery without angina pectoris: Secondary | ICD-10-CM | POA: Diagnosis not present

## 2023-06-22 DIAGNOSIS — Z7984 Long term (current) use of oral hypoglycemic drugs: Secondary | ICD-10-CM | POA: Diagnosis not present

## 2023-06-22 DIAGNOSIS — H9201 Otalgia, right ear: Secondary | ICD-10-CM | POA: Insufficient documentation

## 2023-06-22 DIAGNOSIS — Z794 Long term (current) use of insulin: Secondary | ICD-10-CM | POA: Insufficient documentation

## 2023-06-22 NOTE — ED Triage Notes (Signed)
Reports right eye pain and submandibular lymph node tenderness past 3 days. Reports this started after starting steroid eye drops.

## 2023-06-23 ENCOUNTER — Other Ambulatory Visit (HOSPITAL_COMMUNITY): Payer: Self-pay

## 2023-06-23 ENCOUNTER — Emergency Department (HOSPITAL_BASED_OUTPATIENT_CLINIC_OR_DEPARTMENT_OTHER)
Admission: EM | Admit: 2023-06-23 | Discharge: 2023-06-23 | Disposition: A | Discharge status: Home / Self Care | Payer: Commercial Managed Care - HMO | Attending: Emergency Medicine | Admitting: Emergency Medicine

## 2023-06-23 DIAGNOSIS — H66001 Acute suppurative otitis media without spontaneous rupture of ear drum, right ear: Secondary | ICD-10-CM

## 2023-06-23 MED ORDER — AMOXICILLIN-POT CLAVULANATE 500-125 MG PO TABS
1.0000 | ORAL_TABLET | Freq: Three times a day (TID) | ORAL | 0 refills | Status: DC
  Filled 2023-06-23: qty 21, 7d supply, fill #0

## 2023-06-23 MED ORDER — HYDROCODONE-ACETAMINOPHEN 5-325 MG PO TABS
2.0000 | ORAL_TABLET | Freq: Once | ORAL | Status: AC
  Administered 2023-06-23: 2 via ORAL
  Filled 2023-06-23: qty 2

## 2023-06-23 MED ORDER — AMOXICILLIN-POT CLAVULANATE 875-125 MG PO TABS
1.0000 | ORAL_TABLET | Freq: Once | ORAL | Status: AC
  Administered 2023-06-23: 1 via ORAL
  Filled 2023-06-23: qty 1

## 2023-06-23 MED ORDER — HYDROCODONE-ACETAMINOPHEN 5-325 MG PO TABS
1.0000 | ORAL_TABLET | Freq: Four times a day (QID) | ORAL | 0 refills | Status: DC | PRN
  Filled 2023-06-23: qty 10, 2d supply, fill #0

## 2023-06-23 NOTE — ED Provider Notes (Signed)
[place] EMERGENCY DEPARTMENT AT [facility] Provider Note   CSN: [id number] Arrival date & time: 06/22/23  2250     History  Chief Complaint  Patient presents with   Otalgia    Cindy [name] is a 57 y.o. female.  Patient is a 57 year old female with past medical history of hyperlipidemia, type 2 diabetes, hypertension, coronary artery disease.  Patient presenting with complaints of right ear pain.  This been ongoing for the past 2 days.  She was recently started on steroid eyedrops and is concerned this may be the cause.  She denies decreased hearing or drainage.  The pain radiates to the right side of her face.  No alleviating factors.  The history is provided by the patient.       Home Medications Prior to Admission medications   Medication Sig Start Date End Date Taking? Authorizing Provider  acetaminophen (TYLENOL) 325 MG tablet Take 2 tablets (650 mg total) by mouth every 4 (four) hours as needed for headache or mild pain. 07/09/20   Leone Brand, NP  atorvastatin (LIPITOR) 80 MG tablet Take 1 tablet (80 mg total) by mouth daily. 09/14/22   Parke Poisson, MD  Blood Glucose Monitoring Suppl (TRUE METRIX METER) w/Device KIT Use to test blood sugar 2 (two) times daily. 03/04/23   Adron Bene, MD  clopidogrel (PLAVIX) 75 MG tablet Take 1 tablet (75 mg total) by mouth daily. 12/28/22 12/28/23  Joylene Grapes, NP  Continuous Blood Gluc Sensor (FREESTYLE LIBRE 3 SENSOR) MISC Place 1 sensor on the skin every 14 days. Use to check glucose continuously 06/10/22   Nooruddin, Jason Fila, MD  dorzolamide-timolol (COSOPT) 2-0.5 % ophthalmic solution Place 1 drop into both eyes 2 (two) times daily. 04/27/23   [provider]  ezetimibe (ZETIA) 10 MG tablet Take 1 tablet (10 mg total) by mouth daily. 12/28/22   Monge, Petra Kuba, NP  FARXIGA 10 MG TABS tablet Take 1 tablet (10 mg total) by mouth daily. 03/02/23   Adron Bene, MD  glucose blood test strip Use as  instructed to test blood sugar 2 times daily 03/04/23   Adron Bene, MD  hydrochlorothiazide (HYDRODIURIL) 25 MG tablet Take 1/2 tablet (12.5 mg total) by mouth daily. 04/28/23   Joylene Grapes, NP  insulin glargine (LANTUS SOLOSTAR) 100 UNIT/ML Solostar Pen Inject 40 Units into the skin daily. 06/03/23 07/06/23  Nooruddin, Jason Fila, MD  Insulin Pen Needle (PEN NEEDLES) 32G X 4 MM MISC Use as directed 03/04/23   Adron Bene, MD  Insulin Syringes, Disposable, U-100 0.5 ML MISC 30 Units by Does not apply route 2 (two) times daily with a meal. 08/20/18   Lanelle Bal, MD  isosorbide mononitrate (IMDUR) 60 MG 24 hr tablet Take 1 tablet (60 mg total) by mouth daily. 04/28/23   Joylene Grapes, NP  lisinopril (ZESTRIL) 40 MG tablet Take 1 tablet (40 mg total) by mouth daily. 04/28/23   Joylene Grapes, NP  metFORMIN (GLUCOPHAGE-XR) 500 MG 24 hr tablet Take 1 tablet (500 mg total) by mouth 2 (two) times daily with a meal. 05/06/23   [place], Peter M, MD  metoprolol succinate (TOPROL-XL) 25 MG 24 hr tablet Take 1 tablet (25 mg total) by mouth daily. 12/28/22   Joylene Grapes, NP  nitroGLYCERIN (NITROSTAT) 0.4 MG SL tablet Place 1 tablet (0.4 mg total) under the tongue every 5 (five) minutes as needed for chest pain. 12/28/22   Joylene Grapes, NP  pantoprazole (PROTONIX)  20 MG tablet Take 1 tablet (20 mg total) by mouth daily. 03/22/23   Joylene Grapes, NP  prednisoLONE acetate (PRED FORTE) 1 % ophthalmic suspension Place 1 drop into both eyes 4 (four) times daily. 06/20/23     sertraline (ZOLOFT) 50 MG tablet Take 1 tablet (50 mg total) by mouth at bedtime. 12/27/18   Ihor Austin, NP  TRUEplus Lancets 28G MISC Use to test blood sugar 2 (two) times daily. 03/04/23   Adron Bene, MD      Allergies    Other    Review of Systems   Review of Systems  All other systems reviewed and are negative.   Physical Exam Updated Vital Signs BP (!) 130/59 (BP Location: Right Arm)   Pulse 81   Temp 98.4 F (36.9 C)    Resp 17   LMP 08/11/2016   SpO2 100%  Physical Exam Vitals and nursing note reviewed.  Constitutional:      Appearance: Normal appearance.  HENT:     Right Ear: Ear canal and external ear normal.     Left Ear: Tympanic membrane, ear canal and external ear normal.     Ears:     Comments: The right TM is erythematous and bulging.    Mouth/Throat:     Mouth: Mucous membranes are moist.  Eyes:     Extraocular Movements: Extraocular movements intact.     Conjunctiva/sclera: Conjunctivae normal.     Pupils: Pupils are equal, round, and reactive to light.  Pulmonary:     Effort: Pulmonary effort is normal.  Skin:    General: Skin is warm and dry.  Neurological:     Mental Status: She is alert and oriented to person, place, and time.     ED Results / Procedures / Treatments   Labs (all labs ordered are listed, but only abnormal results are displayed) Labs Reviewed - No data to display  EKG None  Radiology No results found.  Procedures Procedures    Medications Ordered in ED Medications  HYDROcodone-acetaminophen (NORCO/VICODIN) 5-325 MG per tablet 2 tablet (has no administration in time range)  amoxicillin-clavulanate (AUGMENTIN) 875-125 MG per tablet 1 tablet (has no administration in time range)    ED Course/ Medical Decision Making/ A&P  Patient presenting with right ear pain as described in the HPI.  She has an obvious otitis media which will be treated with Augmentin.  She describes recently being started on eyedrops, however this appears to be completely separate from that process.  Final Clinical Impression(s) / ED Diagnoses Final diagnoses:  None    Rx / DC Orders ED Discharge Orders     None         Geoffery Lyons, MD 06/23/23 [phone]

## 2023-06-23 NOTE — Discharge Instructions (Addendum)
Begin taking Augmentin as prescribed.  Take ibuprofen 600 mg every 6 hours as needed for pain.  Take hydrocodone as prescribed as needed for pain not relieved with ibuprofen.  Follow-up with your primary doctor in 1 week for recheck, and return to the ER if symptoms significantly worsen or change.

## 2023-06-28 ENCOUNTER — Other Ambulatory Visit (HOSPITAL_COMMUNITY): Payer: Self-pay

## 2023-06-28 MED ORDER — OFLOXACIN 0.3 % OP SOLN
OPHTHALMIC | 1 refills | Status: DC
  Filled 2023-06-28: qty 5, 10d supply, fill #0
  Filled 2023-08-22: qty 5, 10d supply, fill #1

## 2023-06-28 MED ORDER — KETOROLAC TROMETHAMINE 0.5 % OP SOLN
OPHTHALMIC | 1 refills | Status: DC
  Filled 2023-06-28: qty 5, 10d supply, fill #0
  Filled 2023-08-22: qty 5, 10d supply, fill #1

## 2023-07-01 ENCOUNTER — Emergency Department (HOSPITAL_COMMUNITY): Payer: Commercial Managed Care - HMO

## 2023-07-01 ENCOUNTER — Telehealth: Payer: Self-pay | Admitting: Internal Medicine

## 2023-07-01 ENCOUNTER — Emergency Department (HOSPITAL_COMMUNITY)
Admission: EM | Admit: 2023-07-01 | Discharge: 2023-07-01 | Disposition: A | Discharge status: Home / Self Care | Payer: Commercial Managed Care - HMO | Attending: Emergency Medicine | Admitting: Emergency Medicine

## 2023-07-01 ENCOUNTER — Other Ambulatory Visit: Payer: Self-pay

## 2023-07-01 ENCOUNTER — Encounter (HOSPITAL_COMMUNITY): Payer: Self-pay

## 2023-07-01 DIAGNOSIS — Z794 Long term (current) use of insulin: Secondary | ICD-10-CM | POA: Insufficient documentation

## 2023-07-01 DIAGNOSIS — M79652 Pain in left thigh: Secondary | ICD-10-CM | POA: Insufficient documentation

## 2023-07-01 DIAGNOSIS — R1012 Left upper quadrant pain: Secondary | ICD-10-CM | POA: Insufficient documentation

## 2023-07-01 DIAGNOSIS — Z7984 Long term (current) use of oral hypoglycemic drugs: Secondary | ICD-10-CM | POA: Insufficient documentation

## 2023-07-01 DIAGNOSIS — I10 Essential (primary) hypertension: Secondary | ICD-10-CM | POA: Diagnosis not present

## 2023-07-01 DIAGNOSIS — R079 Chest pain, unspecified: Secondary | ICD-10-CM | POA: Diagnosis not present

## 2023-07-01 DIAGNOSIS — E119 Type 2 diabetes mellitus without complications: Secondary | ICD-10-CM | POA: Insufficient documentation

## 2023-07-01 DIAGNOSIS — R918 Other nonspecific abnormal finding of lung field: Secondary | ICD-10-CM | POA: Diagnosis not present

## 2023-07-01 DIAGNOSIS — M7918 Myalgia, other site: Secondary | ICD-10-CM

## 2023-07-01 DIAGNOSIS — M542 Cervicalgia: Secondary | ICD-10-CM | POA: Diagnosis not present

## 2023-07-01 DIAGNOSIS — R911 Solitary pulmonary nodule: Secondary | ICD-10-CM | POA: Insufficient documentation

## 2023-07-01 DIAGNOSIS — Z79899 Other long term (current) drug therapy: Secondary | ICD-10-CM | POA: Diagnosis not present

## 2023-07-01 DIAGNOSIS — I2510 Atherosclerotic heart disease of native coronary artery without angina pectoris: Secondary | ICD-10-CM | POA: Insufficient documentation

## 2023-07-01 LAB — CBC
HCT: 33.3 % — ABNORMAL LOW (ref 36.0–46.0)
Hemoglobin: 9.9 g/dL — ABNORMAL LOW (ref 12.0–15.0)
MCH: 27.3 pg (ref 26.0–34.0)
MCHC: 29.7 g/dL — ABNORMAL LOW (ref 30.0–36.0)
MCV: 91.7 fL (ref 80.0–100.0)
Platelets: 451 10*3/uL — ABNORMAL HIGH (ref 150–400)
RBC: 3.63 MIL/uL — ABNORMAL LOW (ref 3.87–5.11)
RDW: 14.9 % (ref 11.5–15.5)
WBC: 7.9 10*3/uL (ref 4.0–10.5)
nRBC: 0 % (ref 0.0–0.2)

## 2023-07-01 LAB — TROPONIN I (HIGH SENSITIVITY)
Troponin I (High Sensitivity): 4 ng/L (ref <18)
Troponin I (High Sensitivity): 4 ng/L

## 2023-07-01 LAB — BASIC METABOLIC PANEL
Anion gap: 11 (ref 5–15)
BUN: 21 mg/dL — ABNORMAL HIGH (ref 6–20)
CO2: 27 mmol/L (ref 22–32)
Calcium: 9.2 mg/dL (ref 8.9–10.3)
Chloride: 99 mmol/L (ref 98–111)
Creatinine, Ser: 0.79 mg/dL (ref 0.44–1.00)
GFR, Estimated: >60 mL/min (ref >60)
Glucose, Bld: 299 mg/dL — ABNORMAL HIGH (ref 70–99)
Potassium: 3.8 mmol/L (ref 3.5–5.1)
Sodium: 137 mmol/L (ref 135–145)

## 2023-07-01 MED ORDER — CYCLOBENZAPRINE HCL 5 MG PO TABS
5.0000 mg | ORAL_TABLET | Freq: Three times a day (TID) | ORAL | 0 refills | Status: DC | PRN
Start: 2023-07-01 — End: 2023-10-10
  Filled 2023-07-01: qty 30, 10d supply, fill #0

## 2023-07-01 MED ORDER — IOHEXOL 350 MG/ML SOLN
100.0000 mL | Freq: Once | INTRAVENOUS | Status: AC | PRN
  Administered 2023-07-01: 100 mL via INTRAVENOUS

## 2023-07-01 MED ORDER — FENTANYL CITRATE PF 50 MCG/ML IJ SOSY
50.0000 ug | PREFILLED_SYRINGE | Freq: Once | INTRAMUSCULAR | Status: AC
  Administered 2023-07-01: 50 ug via INTRAVENOUS
  Filled 2023-07-01: qty 1

## 2023-07-01 MED ORDER — CYCLOBENZAPRINE HCL 10 MG PO TABS
5.0000 mg | ORAL_TABLET | Freq: Once | ORAL | Status: AC
  Administered 2023-07-01: 5 mg via ORAL
  Filled 2023-07-01: qty 1

## 2023-07-01 MED ORDER — AMOXICILLIN 500 MG PO CAPS
1000.0000 mg | ORAL_CAPSULE | Freq: Three times a day (TID) | ORAL | 0 refills | Status: DC
Start: 2023-07-01 — End: 2023-10-10
  Filled 2023-07-01: qty 30, 5d supply, fill #0

## 2023-07-01 NOTE — ED Triage Notes (Signed)
Pt complaining of chest pain that begins in her neck and refers down the left side of her body. States that this has been going on for a week, has an appointment Wednesday with her cardiologist. Pt endorses SOB, but denies and N/V/D. Pt also found a lump in her left armpit that she is concerned about.

## 2023-07-01 NOTE — ED Provider Notes (Signed)
[place] EMERGENCY DEPARTMENT AT [facility] Provider Note   CSN: [id number] Arrival date & time: 07/01/23  1219     History  Chief Complaint  Patient presents with   Chest Pain    Cindy [name] is a 57 y.o. female, hx of DMII, CAD, who presents to the ED 2/2 to left neck, left shoulder, left arm, left leg pain, that is sharp and stabbing, and going on for the last 5 days.  She states that it has been going on for the last 5 days, and it has been worse.  Worse with movements.  Does not take anything for the pain including Tylenol or ibuprofen.  Did take nitroglycerin without any relief.  Notes that it is all the time, but progressively getting worse.  Denies any numbness, tingling to the areas.  States she feels like there is some pressure in her arm pit as well. Denies any SOB.    Home Medications Prior to Admission medications   Medication Sig Start Date End Date Taking? Authorizing Provider  amoxicillin (AMOXIL) 500 MG capsule Take 2 capsules (1,000 mg total) by mouth 3 (three) times daily. 07/01/23  Yes ,  L, PA  cyclobenzaprine (FLEXERIL) 5 MG tablet Take 1 tablet (5 mg total) by mouth 3 (three) times daily as needed for muscle spasms. 07/01/23  Yes ,  L, PA  acetaminophen (TYLENOL) 325 MG tablet Take 2 tablets (650 mg total) by mouth every 4 (four) hours as needed for headache or mild pain. 07/09/20   Leone Brand, NP  atorvastatin (LIPITOR) 80 MG tablet Take 1 tablet (80 mg total) by mouth daily. 09/14/22   Parke Poisson, MD  Blood Glucose Monitoring Suppl (TRUE METRIX METER) w/Device KIT Use to test blood sugar 2 (two) times daily. 03/04/23   Adron Bene, MD  clopidogrel (PLAVIX) 75 MG tablet Take 1 tablet (75 mg total) by mouth daily. 12/28/22 12/28/23  Joylene Grapes, NP  Continuous Blood Gluc Sensor (FREESTYLE LIBRE 3 SENSOR) MISC Place 1 sensor on the skin every 14 days. Use to check glucose continuously 06/10/22   Nooruddin, Jason Fila, MD   dorzolamide-timolol (COSOPT) 2-0.5 % ophthalmic solution Place 1 drop into both eyes 2 (two) times daily. 04/27/23   [provider]  ezetimibe (ZETIA) 10 MG tablet Take 1 tablet (10 mg total) by mouth daily. 12/28/22   Monge, Petra Kuba, NP  FARXIGA 10 MG TABS tablet Take 1 tablet (10 mg total) by mouth daily. 03/02/23   Adron Bene, MD  glucose blood test strip Use as instructed to test blood sugar 2 times daily 03/04/23   Adron Bene, MD  hydrochlorothiazide (HYDRODIURIL) 25 MG tablet Take 1/2 tablet (12.5 mg total) by mouth daily. 04/28/23   Joylene Grapes, NP  HYDROcodone-acetaminophen (NORCO) 5-325 MG tablet Take 1-2 tablets by mouth every 6 (six) hours as needed. 06/23/23   Geoffery Lyons, MD  insulin glargine (LANTUS SOLOSTAR) 100 UNIT/ML Solostar Pen Inject 40 Units into the skin daily. 06/03/23 07/06/23  Nooruddin, Jason Fila, MD  Insulin Pen Needle (PEN NEEDLES) 32G X 4 MM MISC Use as directed 03/04/23   Adron Bene, MD  Insulin Syringes, Disposable, U-100 0.5 ML MISC 30 Units by Does not apply route 2 (two) times daily with a meal. 08/20/18   Lanelle Bal, MD  isosorbide mononitrate (IMDUR) 60 MG 24 hr tablet Take 1 tablet (60 mg total) by mouth daily. 04/28/23   Joylene Grapes, NP  ketorolac (ACULAR) 0.5 %  ophthalmic solution Instill 1 drop into left eye four times a day STARTING 1 DAY BEFORE SURGERY 06/28/23     lisinopril (ZESTRIL) 40 MG tablet Take 1 tablet (40 mg total) by mouth daily. 04/28/23   Joylene Grapes, NP  metFORMIN (GLUCOPHAGE-XR) 500 MG 24 hr tablet Take 1 tablet (500 mg total) by mouth 2 (two) times daily with a meal. 05/06/23   [place], Peter M, MD  metoprolol succinate (TOPROL-XL) 25 MG 24 hr tablet Take 1 tablet (25 mg total) by mouth daily. 12/28/22   Joylene Grapes, NP  nitroGLYCERIN (NITROSTAT) 0.4 MG SL tablet Place 1 tablet (0.4 mg total) under the tongue every 5 (five) minutes as needed for chest pain. 12/28/22   Joylene Grapes, NP  ofloxacin (OCUFLOX) 0.3 %  ophthalmic solution Instill 1 drop into left eye four times a day STARTING 1 DAY BEFORE SURGERY 06/28/23     pantoprazole (PROTONIX) 20 MG tablet Take 1 tablet (20 mg total) by mouth daily. 03/22/23   Joylene Grapes, NP  prednisoLONE acetate (PRED FORTE) 1 % ophthalmic suspension Place 1 drop into both eyes 4 (four) times daily. 06/20/23     sertraline (ZOLOFT) 50 MG tablet Take 1 tablet (50 mg total) by mouth at bedtime. 12/27/18   Ihor Austin, NP  TRUEplus Lancets 28G MISC Use to test blood sugar 2 (two) times daily. 03/04/23   Adron Bene, MD      Allergies    Patient has no active allergies.    Review of Systems   Review of Systems  Respiratory:  Negative for shortness of breath.   Cardiovascular:  Positive for chest pain.    Physical Exam Updated Vital Signs BP (!) 103/44   Pulse 74   Temp 97.7 F (36.5 C) (Oral)   Resp (!) 22   Ht 5\' 3"  (1.6 m)   Wt 106.1 kg   LMP 08/11/2016   SpO2 98%   BMI 41.45 kg/m  Physical Exam Vitals and nursing note reviewed.  Constitutional:      General: She is not in acute distress.    Appearance: She is well-developed.  HENT:     Head: Normocephalic and atraumatic.  Eyes:     Conjunctiva/sclera: Conjunctivae normal.  Cardiovascular:     Rate and Rhythm: Normal rate and regular rhythm.     Heart sounds: No murmur heard. Pulmonary:     Effort: Pulmonary effort is normal. No respiratory distress.     Breath sounds: Normal breath sounds.  Chest:     Comments: Tenderness to palpation of left chest wall Abdominal:     Palpations: Abdomen is soft.     Tenderness: There is abdominal tenderness.     Comments: Tenderness to palpation of left side of abdomen.  Musculoskeletal:        General: No swelling.     Cervical back: Neck supple.     Comments: Tenderness to palpation of the left thigh.  Pain with arm, worse with abduction, internal and external rotation arm.  Skin:    General: Skin is warm and dry.     Capillary Refill: Capillary  refill takes less than 2 seconds.  Neurological:     Mental Status: She is alert.  Psychiatric:        Mood and Affect: Mood normal.     ED Results / Procedures / Treatments   Labs (all labs ordered are listed, but only abnormal results are displayed) Labs Reviewed  BASIC METABOLIC PANEL -  Abnormal; Notable for the following components:      Result Value   Glucose, Bld 299 (*)    BUN 21 (*)    All other components within normal limits  CBC - Abnormal; Notable for the following components:   RBC 3.63 (*)    Hemoglobin 9.9 (*)    HCT 33.3 (*)    MCHC 29.7 (*)    Platelets 451 (*)    All other components within normal limits  TROPONIN I (HIGH SENSITIVITY)  TROPONIN I (HIGH SENSITIVITY)    EKG EKG Interpretation Date/Time:  Friday July 01 2023 15:26:43 EDT Ventricular Rate:  75 PR Interval:  185 QRS Duration:  145 QT Interval:  439 QTC Calculation: 491 R Axis:   -7  Text Interpretation: Sinus rhythm Right bundle branch block Confirmed by Fulton Reek [phone]) on 07/01/2023 5:18:22 PM  Radiology CT ANGIO HEAD NECK W WO CM  Result Date: 07/01/2023 CLINICAL DATA:  Carotid artery dissection EXAM: CT ANGIOGRAPHY HEAD AND NECK WITH AND WITHOUT CONTRAST TECHNIQUE: Multidetector CT imaging of the head and neck was performed using the standard protocol during bolus administration of intravenous contrast. Multiplanar CT image reconstructions and MIPs were obtained to evaluate the vascular anatomy. Carotid stenosis measurements (when applicable) are obtained utilizing NASCET criteria, using the distal internal carotid diameter as the denominator. RADIATION DOSE REDUCTION: This exam was performed according to the departmental dose-optimization program which includes automated exposure control, adjustment of the mA and/or kV according to patient size and/or use of iterative reconstruction technique. CONTRAST:  OMNIPAQUE IOHEXOL 350 MG/ML SOLN COMPARISON:  08/19/18 CTA head/neck  FINDINGS: CT HEAD FINDINGS Limitations: Markedly limited assessment due to poor signal to noise ratio. Assessment for the presence of an acute infarct is markedly limited. Brain: No evidence of acute infarction, hemorrhage, hydrocephalus, extra-axial collection or mass lesion/mass effect. Vascular: See below Skull: Normal. Negative for fracture or focal lesion. Sinuses/Orbits: 2 no middle ear or mastoid effusion. Paranasal sinuses are clear. Orbits are unremarkable. Other: None. Review of the MIP images confirms the above findings CTA NECK FINDINGS Aortic arch: Standard branching. Imaged portion shows no evidence of aneurysm or dissection. No significant stenosis of the major arch vessel origins. Right carotid system: No evidence of dissection, stenosis (50% or greater), or occlusion. Left carotid system: No evidence of dissection, stenosis (50% or greater), or occlusion. Vertebral arteries: Right-dominant. No evidence of dissection, stenosis (50% or greater), or occlusion. Skeleton: Diffusely sclerotic appearance of the skeletal structures, unchanged compared to 08/19/2018. Other neck: Negative. Upper chest: Coronary artery calcifications. Review of the MIP images confirms the above findings CTA HEAD FINDINGS Anterior circulation: Moderate stenosis in the cavernous segment of the left ICA. Moderate stenosis in the cavernous segment of the right ICA and at the level of the supraclinoid ICA (series 13, image 192). Moderate to severe focal stenosis of the origin of the superior division of the right MCA (series 13, image 202). Moderate stenosis in the proximal M2 segment of the left MCA (series 13, image 204). Posterior circulation: Moderate narrowing of the P1 P2 junction bilaterally. Moderate narrowing in the mid P2 segment of the right PCA (series 14, 117). Venous sinuses: As permitted by contrast timing, patent. Anatomic variants: None Review of the MIP images confirms the above findings IMPRESSION: 1. Markedly  limited assessment of the brain due to poor signal to noise ratio. Assessment for the presence of an acute infarct is markedly limited. Within this limitation, no acute intracranial abnormality. 2. No hemodynamically  significant stenosis in the neck. 3. Multifocal intracranial atherosclerosis, with moderate to severe focal stenosis of the origin of the superior division of the right MCA. Moderate stenosis in the proximal M2 segment of the left MCA. Moderate narrowing of the P1/P2 junction bilaterally. Moderate narrowing in the mid P2 segment of the right PCA. 4. Diffusely sclerotic appearance of the skeletal structures, unchanged compared to 08/19/2018. Electronically Signed   By: Lorenza Cambridge M.D.   On: 07/01/2023 17:06   CT Angio Chest/Abd/Pel for Dissection W and/or Wo Contrast  Result Date: 07/01/2023 CLINICAL DATA:  Aneurysm suspected.  Shortness of breath. EXAM: CT ANGIOGRAPHY CHEST, ABDOMEN AND PELVIS TECHNIQUE: Non-contrast CT of the chest was initially obtained. Multidetector CT imaging through the chest, abdomen and pelvis was performed using the standard protocol during bolus administration of intravenous contrast. Multiplanar reconstructed images and MIPs were obtained and reviewed to evaluate the vascular anatomy. RADIATION DOSE REDUCTION: This exam was performed according to the departmental dose-optimization program which includes automated exposure control, adjustment of the mA and/or kV according to patient size and/or use of iterative reconstruction technique. CONTRAST:  OMNIPAQUE IOHEXOL 350 MG/ML SOLN COMPARISON:  None Available. FINDINGS: CTA CHEST FINDINGS Cardiovascular: Preferential opacification of the thoracic aorta. No evidence of thoracic aortic aneurysm or dissection. There is bovine arch anatomy. Normal heart size. No pericardial effusion. Mediastinum/Nodes: No enlarged mediastinal, hilar, or axillary lymph nodes. Thyroid gland, trachea, and esophagus demonstrate no  significant findings. Lungs/Pleura: There are minimal patchy ground-glass opacities in the left upper lobe, likely infectious/inflammatory. There is no pleural effusion or pneumothorax. There is a 3 mm right upper lobe nodule image 5/49. Musculoskeletal: No chest wall abnormality. No acute or significant osseous findings. Review of the MIP images confirms the above findings. CTA ABDOMEN AND PELVIS FINDINGS VASCULAR Aorta: Normal caliber aorta without aneurysm, dissection, vasculitis or significant stenosis. There is mild calcified atherosclerotic disease. Celiac: Patent without evidence of aneurysm, dissection, vasculitis or significant stenosis. SMA: Common origin with celiac artery. No evidence for aneurysm or dissection. No thrombosis. Renals: Both renal arteries are patent without evidence of aneurysm, dissection, vasculitis, fibromuscular dysplasia or significant stenosis. IMA: There is mild stenosis of the origin of the inferior mesenteric artery. The artery is otherwise patent. Inflow: Patent without evidence of aneurysm, dissection, vasculitis or significant stenosis. Veins: No obvious venous abnormality within the limitations of this arterial phase study. Review of the MIP images confirms the above findings. NON-VASCULAR Hepatobiliary: There is diffuse gallbladder wall thickening with some calcifications, possibly gallstones. No surrounding inflammation or biliary ductal dilatation. No focal liver lesions are seen. Pancreas: Unremarkable. No pancreatic ductal dilatation or surrounding inflammatory changes. Spleen: Normal in size without focal abnormality. Adrenals/Urinary Tract: Adrenal glands are unremarkable. Kidneys are normal, without renal calculi, focal lesion, or hydronephrosis. Bladder is unremarkable. Stomach/Bowel: Stomach is within normal limits. Appendix appears normal. No evidence of bowel wall thickening, distention, or inflammatory changes. Lymphatic: No enlarged lymph nodes are seen.  Reproductive: Uterus is prominent in size and lobulated likely related to fibroid change. Ovaries are unremarkable. Other: There is a  fat containing umbilical hernia. No ascites. Musculoskeletal: No fracture is seen. Review of the MIP images confirms the above findings. IMPRESSION: 1. No evidence for aortic dissection or aneurysm. 2. Gallbladder wall thickening with possible gallstones. No surrounding inflammation. Recommend right upper quadrant ultrasound for further evaluation. 3. Minimal patchy ground-glass opacities in the left upper lobe, likely infectious/inflammatory. 4. Right solid pulmonary nodule within the upper lobe measuring 3 mm.  Per Fleischner Society Guidelines, if patient is low risk for malignancy, no routine follow-up imaging is recommended. If patient is high risk for malignancy, a non-contrast Chest CT at 12 months is optional. If performed and the nodule is stable at 12 months, no further follow-up is recommended. These guidelines do not apply to immunocompromised patients and patients with cancer. Follow up in patients with significant comorbidities as clinically warranted. For lung cancer screening, adhere to Lung-RADS guidelines. Reference: Radiology. 2017; 284(1):228-43. 5. Fibroid uterus. Electronically Signed   By: Darliss Cheney M.D.   On: 07/01/2023 17:01   DG Chest 2 View  Result Date: 07/01/2023 CLINICAL DATA:  chest pain EXAM: CHEST - 2 VIEW COMPARISON:  02/17/2023. FINDINGS: Low lung volume. Bilateral lung fields are clear. Right medial diaphragmatic focal eventration noted. Bilateral costophrenic angles are clear. Normal cardio-mediastinal silhouette. No acute osseous abnormalities. The soft tissues are within normal limits. IMPRESSION: 1. Low lung volume. No active cardiopulmonary disease. Electronically Signed   By: Jules Schick M.D.   On: 07/01/2023 13:36    Procedures Procedures    Medications Ordered in ED Medications  cyclobenzaprine (FLEXERIL) tablet 5 mg  (5 mg Oral Given 07/01/23 1515)  fentaNYL (SUBLIMAZE) injection 50 mcg (50 mcg Intravenous Given 07/01/23 1523)  iohexol (OMNIPAQUE) 350 MG/ML injection 100 mL (100 mLs Intravenous Contrast Given 07/01/23 1613)    ED Course/ Medical Decision Making/ A&P             HEART Score: 5                    Medical Decision Making Patient is a 57 y.o. female hx of CAD, HTN, DMII, who presents to the ED 2/2 to left sided pain involving neck, chest, arm, abdomen, and thigh. Given the L sided pain w/chest pain will obtain dissection study given co-morbidities. Will obtain troponinx2 as well. Suspicious for possible MSK pain if scan negative given ttp on exam.   Amount and/or Complexity of Data Reviewed Labs: ordered.    Details: Troponin x 2 within normal limits, no acute laboratory abnormalities Radiology: ordered.    Details: Glass ground opacities of left upper lobe, incidental pulmonary nodules, as well as gallstones. ECG/medicine tests:  Decision-making details documented in ED Course. Discussion of management or test interpretation with external provider(s): Discussed with patient, patient found to have glass ground opacities of left upper lobe, will proactively treat for pneumonia, given chest pain.  Additionally found to have nodules, and other incidental findings, discussed with patient, found to have an MCA, that is stenosis, discussed with her, provided referral for neuro interventionalists.  No acute findings however.  We discussed that this is likely musculoskeletal in nature as she is feeling much better after the Flexeril, and the fentanyl.  We discharged home with strict return precautions, and follow-up with the PCP.  Heart score of 5, will closely follow-up with PCP.  Blood pressure slightly low here, given 2 nitroglycerin ingested prior to arrival.  She is not feeling dizzy or short of breath however, and declined IV fluids.  Discharged home.  Risk Prescription drug management.    Final  Clinical Impression(s) / ED Diagnoses Final diagnoses:  Pulmonary nodules  Chest pain, unspecified type  Left upper quadrant abdominal pain  Neck pain on left side  Left thigh pain  Musculoskeletal pain  Ground glass opacity present on imaging of lung    Rx / DC Orders ED Discharge Orders  Ordered    cyclobenzaprine (FLEXERIL) 5 MG tablet  3 times daily PRN        07/01/23 1814    amoxicillin (AMOXIL) 500 MG capsule  3 times daily        07/01/23 1814              , Harley Alto, PA 07/01/23 1825    Laurence Spates, MD 07/07/23 [phone]

## 2023-07-01 NOTE — Discharge Instructions (Addendum)
You have some incidental findings on your CAT scan today including some narrowing of your MCA, or part of your brain.  I have provided you with information to call neurointerventional radiology, to make an appointment for further evaluation.  You are also found to have some pulmonary nodules, and some gallstones.  Please follow-up with your primary care doctor in regards to this.  Does not appear that you have any right upper quadrant pain, thus an ultrasound was not obtained.  I believe that your pain is mostly musculoskeletal, I prescribed you some muscle relaxers for this and you can take Tylenol as well.  You also were found to have some groundglass opacities of your lungs, on x-ray, this may be brewing pneumonia, I have put you on amoxicillin, for 5 days, to treat this.  Return to the ER if you have worsening pain, chest pain, shortness of breath, or worsening symptoms

## 2023-07-01 NOTE — Telephone Encounter (Signed)
Pt c/o of Chest Pain: STAT if CP now or developed within 24 hours  1. Are you having CP right now? Yes  2. Are you experiencing any other symptoms (ex. SOB, nausea, vomiting, sweating)? Lightheaded and dizziness  3. How long have you been experiencing CP? A couple of days but she thought it would get better since taking Nitroglycerin but it hasn't.   4. Is your CP continuous or coming and going? Continuous   5. Have you taken Nitroglycerin? Yes    ?

## 2023-07-01 NOTE — ED Provider Notes (Incomplete)
57 year old female history of CAD, type 2 diabetes presenting for left-sided neck pain that radiates into her chest, shoulder, left arm.  EKG redemonstrates right bundle branch block, no signs of acute ischemia.  On exam she is comfortable.  Her pain is somewhat reproducible over palpation of her trapezius and muscles.  However description of her symptoms is concerning.  CTA head and neck as well as dissection study planned as well as lab workup.  She did have a recent heart cath which was reassuring, lower concern for ACS if troponin is negative.

## 2023-07-01 NOTE — Telephone Encounter (Signed)
Daughter called and stated patient is experiencing chest pain. States patient is also dizzy and feel like she is going to faint. She has taken her IMDUR this morning and 2 nitroglycerin and it has not helped. Advised to call 911 and go to ED

## 2023-07-02 ENCOUNTER — Other Ambulatory Visit (HOSPITAL_COMMUNITY): Payer: Self-pay

## 2023-07-04 ENCOUNTER — Ambulatory Visit (INDEPENDENT_AMBULATORY_CARE_PROVIDER_SITE_OTHER): Payer: Commercial Managed Care - HMO | Admitting: Student

## 2023-07-04 ENCOUNTER — Encounter: Payer: Self-pay | Admitting: Student

## 2023-07-04 ENCOUNTER — Other Ambulatory Visit: Payer: Self-pay

## 2023-07-04 ENCOUNTER — Other Ambulatory Visit (HOSPITAL_COMMUNITY): Payer: Self-pay

## 2023-07-04 VITALS — BP 153/77 | HR 82 | Temp 97.8°F | Ht 63.0 in | Wt 232.2 lb

## 2023-07-04 DIAGNOSIS — Z794 Long term (current) use of insulin: Secondary | ICD-10-CM

## 2023-07-04 DIAGNOSIS — E118 Type 2 diabetes mellitus with unspecified complications: Secondary | ICD-10-CM | POA: Diagnosis not present

## 2023-07-04 DIAGNOSIS — I10 Essential (primary) hypertension: Secondary | ICD-10-CM | POA: Diagnosis not present

## 2023-07-04 DIAGNOSIS — Z0000 Encounter for general adult medical examination without abnormal findings: Secondary | ICD-10-CM

## 2023-07-04 DIAGNOSIS — B029 Zoster without complications: Secondary | ICD-10-CM | POA: Diagnosis not present

## 2023-07-04 DIAGNOSIS — Z7984 Long term (current) use of oral hypoglycemic drugs: Secondary | ICD-10-CM

## 2023-07-04 DIAGNOSIS — I2510 Atherosclerotic heart disease of native coronary artery without angina pectoris: Secondary | ICD-10-CM | POA: Diagnosis not present

## 2023-07-04 HISTORY — DX: Zoster without complications: B02.9

## 2023-07-04 LAB — POCT GLYCOSYLATED HEMOGLOBIN (HGB A1C): Hemoglobin A1C: 11.4 % — AB (ref 4.0–5.6)

## 2023-07-04 LAB — GLUCOSE, CAPILLARY: Glucose-Capillary: 191 mg/dL — ABNORMAL HIGH (ref 70–99)

## 2023-07-04 MED ORDER — HYDROXYZINE HCL 10 MG PO TABS
10.0000 mg | ORAL_TABLET | Freq: Three times a day (TID) | ORAL | 0 refills | Status: DC | PRN
Start: 2023-07-04 — End: 2023-10-10
  Filled 2023-07-04: qty 30, 10d supply, fill #0

## 2023-07-04 MED ORDER — VALACYCLOVIR HCL 1 G PO TABS
1000.0000 mg | ORAL_TABLET | Freq: Three times a day (TID) | ORAL | 0 refills | Status: AC
Start: 2023-07-04 — End: ?
  Filled 2023-07-04: qty 21, 7d supply, fill #0

## 2023-07-04 MED ORDER — INSULIN PEN NEEDLE 32G X 4 MM MISC
30.0000 [IU] | Freq: Two times a day (BID) | 3 refills | Status: DC
  Filled 2023-07-04: qty 100, fill #0
  Filled 2023-07-04: qty 100, 50d supply, fill #0
  Filled 2023-08-22: qty 100, 50d supply, fill #1

## 2023-07-04 MED ORDER — OXYCODONE HCL 5 MG PO TABS
5.0000 mg | ORAL_TABLET | ORAL | 0 refills | Status: DC | PRN
Start: 2023-07-04 — End: 2023-10-10
  Filled 2023-07-04: qty 15, 3d supply, fill #0

## 2023-07-04 MED ORDER — LANTUS SOLOSTAR 100 UNIT/ML ~~LOC~~ SOPN
40.0000 [IU] | PEN_INJECTOR | Freq: Every day | SUBCUTANEOUS | 0 refills | Status: DC
  Filled 2023-07-04: qty 12, 30d supply, fill #0

## 2023-07-04 MED ORDER — NITROGLYCERIN 0.4 MG SL SUBL
0.4000 mg | SUBLINGUAL_TABLET | SUBLINGUAL | 4 refills | Status: DC | PRN
  Filled 2023-07-04: qty 25, 7d supply, fill #0

## 2023-07-04 NOTE — Assessment & Plan Note (Signed)
A1c today is 11.4 (15.5 01/2023). Managed with Farxiga 10 mg/daily, Lantus 40 units/daily, and Metformin 500 mg/BID. Patient has been compliant with medications for the past few months (previous issues with coverage prior to [facility] referral). Patient instructed on the importance of diet and exercise. Anticipate further reduction in A1c with continued adherence, but this can be further evaluated at 3 month follow-up. -Re-check A1c in 3 months

## 2023-07-04 NOTE — Patient Instructions (Addendum)
Thank you for allowing me to be a part of your care team. Today we discussed a few things:  I have sent in an order for Valtrex, Oxycodone, and Hydroxyzine which should help with the shingles Please start taking your blood pressure at home and send a MyChart message with your readings Please follow a healthy diet and exercise at least 2.5 hours/week.

## 2023-07-04 NOTE — Progress Notes (Signed)
CC: Follow-up  HPI:  [name] is a 57 y.o. female living with a history stated below and presents today for follow-up. Please see problem based assessment and plan for additional details.  Past Medical History:  Diagnosis Date   CAD in native artery 07/09/2020   Coronary artery disease    Diabetes mellitus    GERD (gastroesophageal reflux disease)    Hyperlipemia    Hypertension    S/P angioplasty with stent 07/09/2020   Stroke [facility])     Current Outpatient Medications on File Prior to Visit  Medication Sig Dispense Refill   acetaminophen (TYLENOL) 325 MG tablet Take 2 tablets (650 mg total) by mouth every 4 (four) hours as needed for headache or mild pain.     amoxicillin (AMOXIL) 500 MG capsule Take 2 capsules (1,000 mg total) by mouth 3 (three) times daily. 30 capsule 0   atorvastatin (LIPITOR) 80 MG tablet Take 1 tablet (80 mg total) by mouth daily. 90 tablet 3   Blood Glucose Monitoring Suppl (TRUE METRIX METER) w/Device KIT Use to test blood sugar 2 (two) times daily. 1 kit 0   clopidogrel (PLAVIX) 75 MG tablet Take 1 tablet (75 mg total) by mouth daily. 30 tablet 11   Continuous Blood Gluc Sensor (FREESTYLE LIBRE 3 SENSOR) MISC Place 1 sensor on the skin every 14 days. Use to check glucose continuously 2 each 2   cyclobenzaprine (FLEXERIL) 5 MG tablet Take 1 tablet (5 mg total) by mouth 3 (three) times daily as needed for muscle spasms. 30 tablet 0   dorzolamide-timolol (COSOPT) 2-0.5 % ophthalmic solution Place 1 drop into both eyes 2 (two) times daily.     ezetimibe (ZETIA) 10 MG tablet Take 1 tablet (10 mg total) by mouth daily. 90 tablet 3   FARXIGA 10 MG TABS tablet Take 1 tablet (10 mg total) by mouth daily. 30 tablet 6   glucose blood test strip Use as instructed to test blood sugar 2 times daily 100 each 12   hydrochlorothiazide (HYDRODIURIL) 25 MG tablet Take 1/2 tablet (12.5 mg total) by mouth daily. 45 tablet 3   HYDROcodone-acetaminophen (NORCO) 5-325  MG tablet Take 1-2 tablets by mouth every 6 (six) hours as needed. 10 tablet 0   Insulin Pen Needle (PEN NEEDLES) 32G X 4 MM MISC Use as directed 100 each 0   isosorbide mononitrate (IMDUR) 60 MG 24 hr tablet Take 1 tablet (60 mg total) by mouth daily. 90 tablet 3   ketorolac (ACULAR) 0.5 % ophthalmic solution Instill 1 drop into left eye four times a day STARTING 1 DAY BEFORE SURGERY 5 mL 1   lisinopril (ZESTRIL) 40 MG tablet Take 1 tablet (40 mg total) by mouth daily. 90 tablet 3   metFORMIN (GLUCOPHAGE-XR) 500 MG 24 hr tablet Take 1 tablet (500 mg total) by mouth 2 (two) times daily with a meal. 60 tablet 2   metoprolol succinate (TOPROL-XL) 25 MG 24 hr tablet Take 1 tablet (25 mg total) by mouth daily. 90 tablet 3   nitroGLYCERIN (NITROSTAT) 0.4 MG SL tablet Place 1 tablet (0.4 mg total) under the tongue every 5 (five) minutes as needed for chest pain. 25 tablet 4   ofloxacin (OCUFLOX) 0.3 % ophthalmic solution Instill 1 drop into left eye four times a day STARTING 1 DAY BEFORE SURGERY 5 mL 1   pantoprazole (PROTONIX) 20 MG tablet Take 1 tablet (20 mg total) by mouth daily. 90 tablet 2   prednisoLONE acetate (PRED  FORTE) 1 % ophthalmic suspension Place 1 drop into both eyes 4 (four) times daily. 10 mL 1   sertraline (ZOLOFT) 50 MG tablet Take 1 tablet (50 mg total) by mouth at bedtime. 90 tablet 0   TRUEplus Lancets 28G MISC Use to test blood sugar 2 (two) times daily. 100 each 0   No current facility-administered medications on file prior to visit.    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[employer]  Tobacco Use   Smoking status: Never   Smokeless tobacco: Never  Vaping Use   Vaping status: Never Used  Substance and Sexual Activity   Alcohol use: No   Drug use: No   Sexual activity: Not Currently  Other Topics Concern   Not on file  Social History Narrative   Not on file   Social Determinants of Health   Financial Resource Strain: Not on file  Food Insecurity: Not on file  Transportation Needs: Not on file  Physical Activity: Not on file  Stress: Not on file  Social Connections: Not on file  Intimate Partner Violence: Not on file    Review of Systems: ROS negative except for what is noted on the assessment and plan.  Vitals:   07/04/23 1142 07/04/23 1143  BP: (!) 149/77 (!) 153/77    Physical Exam: Constitutional: well-appearing, sitting in chair, in no acute distress Cardiovascular: regular rate and rhythm, no m/r/g Pulmonary/Chest: normal work of breathing on room air, lungs clear to auscultation bilaterally Skin: left-sided vesicular rash around T2 dermatomal distribution without crossing midline Psych: normal mood and behavior  Assessment & Plan:     Patient seen with Dr. Criselda Peaches  Shingles Patient states rash developed 3 days ago and is accompanied with pruritus and pain. Upon examination, vesicular rash present in T2 dermatomal distribution without crossing midline. -Valtrex 1000 mg/TID for 7 days -Hydroxyzine 10 mg prn -Oxycodone 5 mg prn   Type 2 diabetes mellitus with complication, with long-term current use of insulin (HCC) A1c today is 11.4 (15.5 01/2023). Managed with Farxiga 10 mg/daily, Lantus 40 units/daily, and Metformin 500 mg/BID. Patient has been compliant with medications for the past few months (previous issues with coverage prior to [facility] referral). Patient  instructed on the importance of diet and exercise. Anticipate further reduction in A1c with continued adherence, but this can be further evaluated at 3 month follow-up. -Re-check A1c in 3 months  Benign essential HTN BP today elevated at 149/77 with re-check 153/77. Patient in 8/10 pain due to shingles. Managed compliantly with hydrochlorothiazide 12.5 mg/daily, lisinopril 40 mg/daily, and Toprol-XL 25 mg/daily. Patient instructed to measure BP at home this week and to send in a log through MyChart. -Continue current medications pending log   Joselyn Glassman  Annie Paras, D.O. [facility] Internal Medicine, PGY-1 Phone: [phone] Date 07/04/2023 Time 11:51 AM

## 2023-07-04 NOTE — Assessment & Plan Note (Addendum)
Patient states rash developed 3 days ago and is accompanied with pruritus and pain. Upon examination, vesicular rash present in T2 dermatomal distribution without crossing midline. -Valtrex 1000 mg/TID for 7 days -Hydroxyzine 10 mg prn -Oxycodone 5 mg prn

## 2023-07-04 NOTE — Assessment & Plan Note (Signed)
BP today elevated at 149/77 with re-check 153/77. Patient in 8/10 pain due to shingles. Managed compliantly with hydrochlorothiazide 12.5 mg/daily, lisinopril 40 mg/daily, and Toprol-XL 25 mg/daily. Patient instructed to measure BP at home this week and to send in a log through MyChart. -Continue current medications pending log

## 2023-07-04 NOTE — Assessment & Plan Note (Signed)
OBGYN

## 2023-07-04 NOTE — Progress Notes (Signed)
Discussed with patient during visit. Down from 15.5 (01/2023) now that patient is taking medications regularly.

## 2023-07-04 NOTE — Progress Notes (Deleted)
CC: Follow-up  HPI:  Cindy [name] is a 57 y.o. female living with a history stated below and presents today for a follow-up. Please see problem based assessment and plan for additional details.  Past Medical History:  Diagnosis Date   CAD in native artery 07/09/2020   Coronary artery disease    Diabetes mellitus    GERD (gastroesophageal reflux disease)    Hyperlipemia    Hypertension    S/P angioplasty with stent 07/09/2020   Stroke [facility])     Current Outpatient Medications on File Prior to Visit  Medication Sig Dispense Refill   acetaminophen (TYLENOL) 325 MG tablet Take 2 tablets (650 mg total) by mouth every 4 (four) hours as needed for headache or mild pain.     amoxicillin (AMOXIL) 500 MG capsule Take 2 capsules (1,000 mg total) by mouth 3 (three) times daily. 30 capsule 0   atorvastatin (LIPITOR) 80 MG tablet Take 1 tablet (80 mg total) by mouth daily. 90 tablet 3   Blood Glucose Monitoring Suppl (TRUE METRIX METER) w/Device KIT Use to test blood sugar 2 (two) times daily. 1 kit 0   clopidogrel (PLAVIX) 75 MG tablet Take 1 tablet (75 mg total) by mouth daily. 30 tablet 11   Continuous Blood Gluc Sensor (FREESTYLE LIBRE 3 SENSOR) MISC Place 1 sensor on the skin every 14 days. Use to check glucose continuously 2 each 2   cyclobenzaprine (FLEXERIL) 5 MG tablet Take 1 tablet (5 mg total) by mouth 3 (three) times daily as needed for muscle spasms. 30 tablet 0   dorzolamide-timolol (COSOPT) 2-0.5 % ophthalmic solution Place 1 drop into both eyes 2 (two) times daily.     ezetimibe (ZETIA) 10 MG tablet Take 1 tablet (10 mg total) by mouth daily. 90 tablet 3   FARXIGA 10 MG TABS tablet Take 1 tablet (10 mg total) by mouth daily. 30 tablet 6   glucose blood test strip Use as instructed to test blood sugar 2 times daily 100 each 12   hydrochlorothiazide (HYDRODIURIL) 25 MG tablet Take 1/2 tablet (12.5 mg total) by mouth daily. 45 tablet 3   HYDROcodone-acetaminophen (NORCO)  5-325 MG tablet Take 1-2 tablets by mouth every 6 (six) hours as needed. 10 tablet 0   insulin glargine (LANTUS SOLOSTAR) 100 UNIT/ML Solostar Pen Inject 40 Units into the skin daily. 12 mL 0   Insulin Pen Needle (PEN NEEDLES) 32G X 4 MM MISC Use as directed 100 each 0   Insulin Syringes, Disposable, U-100 0.5 ML MISC 30 Units by Does not apply route 2 (two) times daily with a meal. 100 each 3   isosorbide mononitrate (IMDUR) 60 MG 24 hr tablet Take 1 tablet (60 mg total) by mouth daily. 90 tablet 3   ketorolac (ACULAR) 0.5 % ophthalmic solution Instill 1 drop into left eye four times a day STARTING 1 DAY BEFORE SURGERY 5 mL 1   lisinopril (ZESTRIL) 40 MG tablet Take 1 tablet (40 mg total) by mouth daily. 90 tablet 3   metFORMIN (GLUCOPHAGE-XR) 500 MG 24 hr tablet Take 1 tablet (500 mg total) by mouth 2 (two) times daily with a meal. 60 tablet 2   metoprolol succinate (TOPROL-XL) 25 MG 24 hr tablet Take 1 tablet (25 mg total) by mouth daily. 90 tablet 3   nitroGLYCERIN (NITROSTAT) 0.4 MG SL tablet Place 1 tablet (0.4 mg total) under the tongue every 5 (five) minutes as needed for chest pain. 25 tablet 4   ofloxacin (  OCUFLOX) 0.3 % ophthalmic solution Instill 1 drop into left eye four times a day STARTING 1 DAY BEFORE SURGERY 5 mL 1   pantoprazole (PROTONIX) 20 MG tablet Take 1 tablet (20 mg total) by mouth daily. 90 tablet 2   prednisoLONE acetate (PRED FORTE) 1 % ophthalmic suspension Place 1 drop into both eyes 4 (four) times daily. 10 mL 1   sertraline (ZOLOFT) 50 MG tablet Take 1 tablet (50 mg total) by mouth at bedtime. 90 tablet 0   TRUEplus Lancets 28G MISC Use to test blood sugar 2 (two) times daily. 100 each 0   No current facility-administered medications on file prior to visit.    Family History  Problem Relation Age of Onset   Diabetes Mother    Hypertension Mother    Breast cancer Mother    Stroke Mother    Hypertension Father    Heart failure Father    Breast cancer Maternal  Grandmother    Stroke Brother    Colon cancer Neg Hx    Stomach cancer Neg Hx    Rectal cancer Neg Hx    Esophageal cancer Neg Hx    Liver cancer Neg Hx     Social History   Socioeconomic History   Marital status: Single    Spouse name: Not on file   Number of children: 2   Years of education: 12   Highest education level: Not on file  Occupational History   Occupation: [employer]- D[employer]  Tobacco Use   Smoking status: Never   Smokeless tobacco: Never  Vaping Use   Vaping status: Never Used  Substance and Sexual Activity   Alcohol use: No   Drug use: No   Sexual activity: Not Currently  Other Topics Concern   Not on file  Social History Narrative   Not on file   Social Determinants of Health   Financial Resource Strain: Not on file  Food Insecurity: Not on file  Transportation Needs: Not on file  Physical Activity: Not on file  Stress: Not on file  Social Connections: Not on file  Intimate Partner Violence: Not on file    Review of Systems: ROS negative except for what is noted on the assessment and plan.  There were no vitals filed for this visit.  Physical Exam: Constitutional: well-appearing, sitting in chair, in no acute distress HENT: normocephalic atraumatic, mucous membranes moist Eyes: conjunctiva non-erythematous Cardiovascular: regular rate and rhythm, no m/r/g Pulmonary/Chest: normal work of breathing on room air, lungs clear to auscultation bilaterally Abdominal: soft, non-tender, non-distended MSK: normal bulk and tone Neurological: alert & oriented x 3, no focal deficit Skin: warm and dry Psych: normal mood and behavior  Assessment & Plan:     Patient seen with Dr. {[id number]::"[id number]","U. Hoffman","Mullen","Narendra","Vincent","Guilloud","Lau","Machen"}  No problem-specific Assessment & Plan notes found for this encounter.   Cindy [name], D.O. [facility] Internal Medicine, PGY-1 Phone: [phone] Date 07/04/2023 Time  9:15 AM

## 2023-07-05 ENCOUNTER — Other Ambulatory Visit (HOSPITAL_COMMUNITY): Payer: Self-pay

## 2023-07-05 NOTE — Assessment & Plan Note (Signed)
Patient states rash developed 3 days ago and is accompanied with pruritus and pain. Upon examination, vesicular rash present in T2 dermatomal distribution without crossing midline. -Valtrex 1000 mg/TID for 7 days -Hydroxyzine 10 mg prn -Oxycodone 5 mg prn

## 2023-07-05 NOTE — Assessment & Plan Note (Signed)
BP today elevated at 149/77 with re-check 153/77. Patient in 8/10 pain due to shingles. Managed compliantly with hydrochlorothiazide 12.5 mg/daily, lisinopril 40 mg/daily, and Toprol-XL 25 mg/daily. Patient instructed to measure BP at home this week and to send in a log through MyChart. -Continue current medications pending log

## 2023-07-05 NOTE — Assessment & Plan Note (Signed)
A1c today is 11.4 (15.5 01/2023). Managed with Farxiga 10 mg/daily, Lantus 40 units/daily, and Metformin 500 mg/BID. Patient has been compliant with medications for the past few months (previous issues with coverage prior to [facility] referral). Patient instructed on the importance of diet and exercise. Anticipate further reduction in A1c with continued adherence, but this can be further evaluated at 3 month follow-up. -Re-check A1c in 3 months

## 2023-07-05 NOTE — Progress Notes (Signed)
CC: Follow-up and rash  HPI:  [name] is a 57 y.o. female living with a history stated below and presents today for follow-up and rash. Please see problem based assessment and plan for additional details.  Past Medical History:  Diagnosis Date   CAD in native artery 07/09/2020   Coronary artery disease    Diabetes mellitus    GERD (gastroesophageal reflux disease)    Hyperlipemia    Hypertension    S/P angioplasty with stent 07/09/2020   Stroke [facility])     Current Outpatient Medications on File Prior to Visit  Medication Sig Dispense Refill   acetaminophen (TYLENOL) 325 MG tablet Take 2 tablets (650 mg total) by mouth every 4 (four) hours as needed for headache or mild pain.     amoxicillin (AMOXIL) 500 MG capsule Take 2 capsules (1,000 mg total) by mouth 3 (three) times daily. 30 capsule 0   atorvastatin (LIPITOR) 80 MG tablet Take 1 tablet (80 mg total) by mouth daily. 90 tablet 3   Blood Glucose Monitoring Suppl (TRUE METRIX METER) w/Device KIT Use to test blood sugar 2 (two) times daily. 1 kit 0   clopidogrel (PLAVIX) 75 MG tablet Take 1 tablet (75 mg total) by mouth daily. 30 tablet 11   Continuous Blood Gluc Sensor (FREESTYLE LIBRE 3 SENSOR) MISC Place 1 sensor on the skin every 14 days. Use to check glucose continuously 2 each 2   cyclobenzaprine (FLEXERIL) 5 MG tablet Take 1 tablet (5 mg total) by mouth 3 (three) times daily as needed for muscle spasms. 30 tablet 0   dorzolamide-timolol (COSOPT) 2-0.5 % ophthalmic solution Place 1 drop into both eyes 2 (two) times daily.     ezetimibe (ZETIA) 10 MG tablet Take 1 tablet (10 mg total) by mouth daily. 90 tablet 3   FARXIGA 10 MG TABS tablet Take 1 tablet (10 mg total) by mouth daily. 30 tablet 6   glucose blood test strip Use as instructed to test blood sugar 2 times daily 100 each 12   hydrochlorothiazide (HYDRODIURIL) 25 MG tablet Take 1/2 tablet (12.5 mg total) by mouth daily. 45 tablet 3    HYDROcodone-acetaminophen (NORCO) 5-325 MG tablet Take 1-2 tablets by mouth every 6 (six) hours as needed. 10 tablet 0   Insulin Pen Needle (PEN NEEDLES) 32G X 4 MM MISC Use as directed 100 each 0   isosorbide mononitrate (IMDUR) 60 MG 24 hr tablet Take 1 tablet (60 mg total) by mouth daily. 90 tablet 3   ketorolac (ACULAR) 0.5 % ophthalmic solution Instill 1 drop into left eye four times a day STARTING 1 DAY BEFORE SURGERY 5 mL 1   lisinopril (ZESTRIL) 40 MG tablet Take 1 tablet (40 mg total) by mouth daily. 90 tablet 3   metFORMIN (GLUCOPHAGE-XR) 500 MG 24 hr tablet Take 1 tablet (500 mg total) by mouth 2 (two) times daily with a meal. 60 tablet 2   metoprolol succinate (TOPROL-XL) 25 MG 24 hr tablet Take 1 tablet (25 mg total) by mouth daily. 90 tablet 3   ofloxacin (OCUFLOX) 0.3 % ophthalmic solution Instill 1 drop into left eye four times a day STARTING 1 DAY BEFORE SURGERY 5 mL 1   pantoprazole (PROTONIX) 20 MG tablet Take 1 tablet (20 mg total) by mouth daily. 90 tablet 2   prednisoLONE acetate (PRED FORTE) 1 % ophthalmic suspension Place 1 drop into both eyes 4 (four) times daily. 10 mL 1   sertraline (ZOLOFT) 50 MG tablet  Take 1 tablet (50 mg total) by mouth at bedtime. 90 tablet 0   TRUEplus Lancets 28G MISC Use to test blood sugar 2 (two) times daily. 100 each 0   No current facility-administered medications on file prior to visit.    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  Tobacco Use   Smoking status: Never   Smokeless tobacco: Never  Vaping Use   Vaping status: Never Used  Substance and Sexual Activity   Alcohol use: No   Drug use: No   Sexual activity: Not Currently  Other Topics Concern   Not on file  Social History Narrative   Not on file   Social Determinants of Health   Financial Resource Strain: Not on file  Food Insecurity: Not on file  Transportation Needs: Not on file  Physical Activity: Not on file  Stress: Not on file  Social Connections: Not on file  Intimate Partner Violence: Not on file    Review of Systems: ROS negative except for what is noted on the assessment and plan.  Vitals:   07/04/23 0952 07/04/23 1056  BP: (!) 149/77 (!) 153/77  Pulse: 82 82  Temp: 97.8 F (36.6 C)   TempSrc: Oral   SpO2: 100%   Weight: 232 lb 3.2 oz (105.3 kg)   Height: 5\' 3"  (1.6 m)     Physical Exam: Constitutional: well-appearing, sitting in chair, in no acute distress Cardiovascular: regular rate and rhythm, no m/r/g Pulmonary/Chest: normal work of breathing on room air, lungs clear to auscultation bilaterally Skin: left-sided vesicular rash around T2 dermatomal distribution without crossing midline Psych: normal mood and behavior  Assessment & Plan:     Patient seen with Dr. Criselda Peaches  Healthcare maintenance OBGYN  Benign essential HTN BP today elevated at 149/77 with re-check 153/77. Patient in 8/10 pain due to shingles. Managed compliantly with hydrochlorothiazide 12.5 mg/daily, lisinopril 40 mg/daily, and Toprol-XL 25 mg/daily. Patient instructed to measure BP at home this week and to send in a log through MyChart. -Continue current medications pending log  Type 2 diabetes mellitus with complication, with long-term current use of insulin (HCC) A1c today is 11.4 (15.5 01/2023). Managed with Farxiga 10 mg/daily, Lantus 40 units/daily, and Metformin 500 mg/BID. Patient  has been compliant with medications for the past few months (previous issues with coverage prior to [facility] referral). Patient instructed on the importance of diet and exercise. Anticipate further reduction in A1c with continued adherence, but this can be further evaluated at 3 month follow-up. -Re-check A1c in 3 months    Shingles Patient states rash developed 3 days ago and is accompanied with pruritus and pain. Upon examination, vesicular rash present in T2 dermatomal distribution without crossing midline. -Valtrex 1000  mg/TID for 7 days -Hydroxyzine 10 mg prn -Oxycodone 5 mg prn      Carmina Miller, D.O. [facility] Internal Medicine, PGY-1 Phone: [phone] Date 07/05/2023 Time 8:31 AM

## 2023-07-06 ENCOUNTER — Ambulatory Visit: Payer: Commercial Managed Care - HMO | Admitting: Internal Medicine

## 2023-07-06 NOTE — Progress Notes (Signed)
Internal Medicine Clinic Attending  I was physically present during the key portions of the resident provided service and participated in the medical decision making of patient's management care. I reviewed pertinent patient test results.  The assessment, diagnosis, and plan were formulated together and I agree with the documentation in the resident's note.  Agree with plan.  Dermatomal distribution, rash < 72 hours and also with active vesicles, so treatment with antiviral is appropriate.  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  We also advised that is she is around children or the elderly, to wear garments that completely cover the rash to avoid passing the virus.   Inez Catalina, MD

## 2023-07-11 NOTE — Progress Notes (Deleted)
Cardiology Clinic Note   Patient Name: Cindy [name] Date of Encounter: 07/11/2023  Primary Care Provider:  Olegario Messier, MD Primary Cardiologist:  Parke Poisson, MD  Patient Profile    57 year old female with a history of CAD s/p DES x 2-LAD in 2021, hypertension, hyperlipidemia, type 2 diabetes, OSA, and GERD who presents for follow-up related to CAD. She underwent cardiac catheterization on 05/03/2023 which revealed 40%-50% RCA stenosis, 20% proximal and mid LAD stenosis, 25% LCx stenosis, patent LAD stents, nonobstructive CAD.  Ongoing medical therapy was advised.  She has had frequent visits to the emergency room for complaints of chest pain.  Past Medical History    Past Medical History:  Diagnosis Date   CAD in native artery 07/09/2020   Coronary artery disease    Diabetes mellitus    GERD (gastroesophageal reflux disease)    Hyperlipemia    Hypertension    S/P angioplasty with stent 07/09/2020   Stroke [facility])    Past Surgical History:  Procedure Laterality Date   CARDIAC CATHETERIZATION     CORONARY STENT INTERVENTION N/A 07/08/2020   Procedure: CORONARY STENT INTERVENTION;  Surgeon: Corky Crafts, MD;  Location: [facility] INVASIVE CV LAB;  Service: Cardiovascular;  Laterality: N/A;   CORONARY ULTRASOUND/IVUS N/A 07/08/2020   Procedure: Intravascular Ultrasound/IVUS;  Surgeon: Corky Crafts, MD;  Location: [facility] INVASIVE CV LAB;  Service: Cardiovascular;  Laterality: N/A;   LEFT HEART CATH AND CORONARY ANGIOGRAPHY N/A 07/07/2020   Procedure: LEFT HEART CATH AND CORONARY ANGIOGRAPHY;  Surgeon: Runell Gess, MD;  Location: [facility] INVASIVE CV LAB;  Service: Cardiovascular;  Laterality: N/A;   LEFT HEART CATH AND CORONARY ANGIOGRAPHY N/A 05/03/2023   Procedure: LEFT HEART CATH AND CORONARY ANGIOGRAPHY;  Surgeon: [place], Peter M, MD;  Location: [facility] INVASIVE CV LAB;  Service: Cardiovascular;  Laterality: N/A;   None      Allergies  No Active Allergies  History of  Present Illness    Mrs. Popma returns today for ongoing assessment management of coronary artery disease with history of type 2 diabetes, OSA and GERD.  Seen in ED on 07/01/2023 for chest pain, left neck left shoulder left arm and left leg pain as well.  Described as sharp and stabbing worsening over 5 days prior to being seen nitroglycerin did not provide relief.  She was treated with Flexeril and was proactively treated for pneumonia with amoxicillin, as she did have some ground glass opacities in the left upper lobe.  Home Medications    Current Outpatient Medications  Medication Sig Dispense Refill   acetaminophen (TYLENOL) 325 MG tablet Take 2 tablets (650 mg total) by mouth every 4 (four) hours as needed for headache or mild pain.     amoxicillin (AMOXIL) 500 MG capsule Take 2 capsules (1,000 mg total) by mouth 3 (three) times daily. 30 capsule 0   atorvastatin (LIPITOR) 80 MG tablet Take 1 tablet (80 mg total) by mouth daily. 90 tablet 3   Blood Glucose Monitoring Suppl (TRUE METRIX METER) w/Device KIT Use to test blood sugar 2 (two) times daily. 1 kit 0   clopidogrel (PLAVIX) 75 MG tablet Take 1 tablet (75 mg total) by mouth daily. 30 tablet 11   Continuous Blood Gluc Sensor (FREESTYLE LIBRE 3 SENSOR) MISC Place 1 sensor on the skin every 14 days. Use to check glucose continuously 2 each 2   cyclobenzaprine (FLEXERIL) 5 MG tablet Take 1 tablet (5 mg total) by mouth 3 (three) times daily as needed  for muscle spasms. 30 tablet 0   dorzolamide-timolol (COSOPT) 2-0.5 % ophthalmic solution Place 1 drop into both eyes 2 (two) times daily.     ezetimibe (ZETIA) 10 MG tablet Take 1 tablet (10 mg total) by mouth daily. 90 tablet 3   FARXIGA 10 MG TABS tablet Take 1 tablet (10 mg total) by mouth daily. 30 tablet 6   glucose blood test strip Use as instructed to test blood sugar 2 times daily 100 each 12   hydrochlorothiazide (HYDRODIURIL) 25 MG tablet Take 1/2 tablet (12.5 mg total) by mouth daily. 45  tablet 3   HYDROcodone-acetaminophen (NORCO) 5-325 MG tablet Take 1-2 tablets by mouth every 6 (six) hours as needed. 10 tablet 0   hydrOXYzine (ATARAX) 10 MG tablet Take 1 tablet (10 mg total) by mouth 3 (three) times daily as needed. 30 tablet 0   insulin glargine (LANTUS SOLOSTAR) 100 UNIT/ML Solostar Pen Inject 40 Units into the skin daily. 12 mL 0   Insulin Pen Needle (PEN NEEDLES) 32G X 4 MM MISC Use as directed 100 each 0   Insulin Pen Needle 32G X 4 MM MISC Use up to 2 (two) times daily as directed 100 each 3   isosorbide mononitrate (IMDUR) 60 MG 24 hr tablet Take 1 tablet (60 mg total) by mouth daily. 90 tablet 3   ketorolac (ACULAR) 0.5 % ophthalmic solution Instill 1 drop into left eye four times a day STARTING 1 DAY BEFORE SURGERY 5 mL 1   lisinopril (ZESTRIL) 40 MG tablet Take 1 tablet (40 mg total) by mouth daily. 90 tablet 3   metFORMIN (GLUCOPHAGE-XR) 500 MG 24 hr tablet Take 1 tablet (500 mg total) by mouth 2 (two) times daily with a meal. 60 tablet 2   metoprolol succinate (TOPROL-XL) 25 MG 24 hr tablet Take 1 tablet (25 mg total) by mouth daily. 90 tablet 3   nitroGLYCERIN (NITROSTAT) 0.4 MG SL tablet Place 1 tablet (0.4 mg total) under the tongue every 5 (five) minutes as needed for chest pain. 25 tablet 4   ofloxacin (OCUFLOX) 0.3 % ophthalmic solution Instill 1 drop into left eye four times a day STARTING 1 DAY BEFORE SURGERY 5 mL 1   oxyCODONE (OXY IR/ROXICODONE) 5 MG immediate release tablet Take 1 tablet (5 mg total) by mouth every 4 (four) hours as needed for severe pain. 15 tablet 0   pantoprazole (PROTONIX) 20 MG tablet Take 1 tablet (20 mg total) by mouth daily. 90 tablet 2   prednisoLONE acetate (PRED FORTE) 1 % ophthalmic suspension Place 1 drop into both eyes 4 (four) times daily. 10 mL 1   sertraline (ZOLOFT) 50 MG tablet Take 1 tablet (50 mg total) by mouth at bedtime. 90 tablet 0   TRUEplus Lancets 28G MISC Use to test blood sugar 2 (two) times daily. 100 each 0    valACYclovir (VALTREX) 1000 MG tablet Take 1 tablet (1,000 mg total) by mouth 3 (three) times daily. 21 tablet 0   No current facility-administered medications for this visit.     Family History    Family History  Problem Relation Age of Onset   Diabetes Mother    Hypertension Mother    Breast cancer Mother    Stroke Mother    Hypertension Father    Heart failure Father    Breast cancer Maternal Grandmother    Stroke Brother    Colon cancer Neg Hx    Stomach cancer Neg Hx    Rectal cancer  Neg Hx    Esophageal cancer Neg Hx    Liver cancer Neg Hx    She indicated that her mother is alive. She indicated that her father is deceased. She indicated that her brother is alive. She indicated that her maternal grandmother is deceased. She indicated that the status of her neg hx is unknown.  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  Tobacco Use   Smoking status: Never   Smokeless tobacco: Never  Vaping Use   Vaping status: Never Used  Substance and Sexual Activity   Alcohol use: No   Drug use: No   Sexual activity: Not Currently  Other Topics Concern   Not on file  Social History Narrative   Not on file   Social Determinants of Health   Financial Resource Strain: Not on file  Food Insecurity: Not on file  Transportation Needs: Not on file  Physical Activity: Not on file  Stress: Not on file  Social Connections: Not on file  Intimate Partner Violence: Not on file     Review of Systems    General:  No chills, fever, night sweats or weight changes.  Cardiovascular:  No chest pain, dyspnea on exertion, edema, orthopnea, palpitations, paroxysmal nocturnal dyspnea. Dermatological: No rash, lesions/masses Respiratory: No cough, dyspnea Urologic: No  hematuria, dysuria Abdominal:   No nausea, vomiting, diarrhea, bright red blood per rectum, melena, or hematemesis Neurologic:  No visual changes, wkns, changes in mental status. All other systems reviewed and are otherwise negative except as noted above.       Physical Exam    VS:  LMP 08/11/2016  , BMI There is no height or weight on file to calculate BMI.     GEN: Well nourished, well developed, in no acute distress. HEENT: normal. Neck: Supple, no JVD, carotid bruits, or masses. Cardiac: RRR, no murmurs, rubs, or gallops. No clubbing, cyanosis, edema.  Radials/DP/PT 2+ and equal bilaterally.  Respiratory:  Respirations regular and unlabored, clear to auscultation bilaterally. GI: Soft, nontender, nondistended, BS + x 4. MS: no deformity or atrophy. Skin: warm and dry, no rash. Neuro:  Strength and sensation are intact. Psych: Normal affect.      Lab Results  Component Value Date   WBC 7.9 07/01/2023   HGB 9.9 (L) 07/01/2023   HCT 33.3 (L) 07/01/2023   MCV 91.7 07/01/2023   PLT 451 (H) 07/01/2023   Lab Results  Component Value Date   CREATININE 0.79 07/01/2023   BUN 21 (H) 07/01/2023   NA 137 07/01/2023   K 3.8 07/01/2023   CL 99 07/01/2023   CO2 27 07/01/2023   Lab Results  Component Value Date   ALT 13 05/08/2023   AST 15 05/08/2023   ALKPHOS 85 05/08/2023   BILITOT 0.5 05/08/2023   Lab Results  Component Value Date   CHOL 129 12/28/2022   HDL 43 12/28/2022   LDLCALC 65 12/28/2022   TRIG 113 12/28/2022   CHOLHDL 3.0 12/28/2022    Lab Results  Component Value Date   HGBA1C 11.4 (A) 07/04/2023     Review of Prior Studies    Cardiac Cath 05/03/2023   Prox RCA-1 lesion is 40% stenosed.   Prox RCA-2 lesion  is 40% stenosed.   Dist RCA lesion is 50% stenosed.   Prox LAD to Mid LAD lesion is 20% stenosed.   Prox Cx to Dist Cx lesion is 25% stenosed.   Previously placed Mid LAD to Dist LAD stent of unknown type is  widely patent.   LV end diastolic  pressure is normal.   Nonobstructive CAD. Continued excellent patency of stents in the LAD Normal LVEDP 13 mm Hg   Plan: continued medical therapy. Symptoms may be related to microvascular disease due to poorly controlled DM.   Diagnostic Dominance: Right   Assessment & Plan   1.  ***     {Are you ordering a CV Procedure (e.g. stress test, cath, DCCV, TEE, etc)?   Press F2        :[id number]}   Signed, Bettey Mare. Liborio Nixon, ANP, AACC   07/11/2023 7:34 AM      Office [phone] Fax [phone]  Notice: This dictation was prepared with Dragon dictation along with smaller phrase technology. Any transcriptional errors that result from this process are unintentional and may not be corrected upon review.

## 2023-07-13 ENCOUNTER — Encounter: Payer: Self-pay | Admitting: Physician Assistant

## 2023-07-13 ENCOUNTER — Ambulatory Visit: Payer: Commercial Managed Care - HMO | Attending: Internal Medicine | Admitting: Physician Assistant

## 2023-07-13 ENCOUNTER — Ambulatory Visit: Payer: Commercial Managed Care - HMO | Admitting: Adult Health

## 2023-07-13 VITALS — BP 116/72 | HR 87 | Ht 63.0 in | Wt 230.8 lb

## 2023-07-13 DIAGNOSIS — I10 Essential (primary) hypertension: Secondary | ICD-10-CM

## 2023-07-13 DIAGNOSIS — E0822 Diabetes mellitus due to underlying condition with diabetic chronic kidney disease: Secondary | ICD-10-CM

## 2023-07-13 DIAGNOSIS — I2510 Atherosclerotic heart disease of native coronary artery without angina pectoris: Secondary | ICD-10-CM

## 2023-07-13 DIAGNOSIS — G4733 Obstructive sleep apnea (adult) (pediatric): Secondary | ICD-10-CM

## 2023-07-13 DIAGNOSIS — E785 Hyperlipidemia, unspecified: Secondary | ICD-10-CM

## 2023-07-13 NOTE — Progress Notes (Signed)
Cardiology Office Note:    Date:  07/13/2023   ID:  Rainey Pines, DOB 1966-08-31, MRN [id number]  PCP:  Olegario Messier, MD   [facility] Providers Cardiologist:  Parke Poisson, MD { Referring MD: Olegario Messier, MD   Chief Complaint  Patient presents with   Follow-up    6 month follow up. Patient is doing well on today. Meds reviewed.     History of Present Illness:    Casy Nikolas is a 57 y.o. female with a hx of CVA, CAD, HTN, HLD, DM2, OSA on CPAP, GERD, and obesity.  She has a family history of premature heart disease: Father died at 58 with MI.  She suffered a CVA in 2019 and has been maintained on aspirin and Plavix.  She was admitted in August 2021 with chest pain.  Troponins were not revealing however given her history and risk factors definitive angiography was pursued, which resulted in DES x2 to the mid and distal LAD.  She has residual disease in the RCA, including 60% in the proximal vessel and 80% in the distal vessel that was treated medically.  LDL was 153 and her A1c was 12.3% at that time.  At follow-up, she reported she could not afford insulin and her insurance did not cover her current insulin prescription. I saw her in follow up in 2022.   She presented to the ER 02/17/2023 with chest pain and flat cardiac enzymes.  Nuclear stress test showed normal perfusion but evidence of transient ischemic dilation.  She was recommended for further ischemic evaluation with cardiac PET or heart catheterization should she have recurrent chest pain.  At follow-up she had significant dyspnea on exertion and was sent for cardiac PET stress test which revealed overall normal perfusion and no evidence of ischemia or infarction but myocardial blood flow was abnormal.  She was referred for definitive angiography.  LHC 05/03/23 showed nonobstructive CAD and continued patency of LAD stents with an LVEDP 13 mmHg.  Symptoms felt possibly related to microvascular disease due  to poorly controlled DM.  Since her heart catheterization she has had 2 ER evaluations, 1 for right-sided numbness with unremarkable head CT/brain MRI.  She was discharged without admissions.  She was last seen by Bernadene Person NP 05/20/2023.  She was doing well with increased isosorbide to 60 mg daily.    She was added to my schedule same day - she had shingles and lesions had not crusted. Shingles felt likely responsible for her chest pain. She is recovering. She has no cardiac complaints.   She remains on plavix, 80 mg lipitor, 10 mg zetia, 10 mg farxiga, 12.5 mg hydrochlorothiazide, 60 mg imdur, 40 mg lisinopril, 25 mg toprol.    Past Medical History:  Diagnosis Date   CAD in native artery 07/09/2020   Coronary artery disease    Diabetes mellitus    GERD (gastroesophageal reflux disease)    Hyperlipemia    Hypertension    S/P angioplasty with stent 07/09/2020   Stroke [facility])     Past Surgical History:  Procedure Laterality Date   CARDIAC CATHETERIZATION     CORONARY STENT INTERVENTION N/A 07/08/2020   Procedure: CORONARY STENT INTERVENTION;  Surgeon: Corky Crafts, MD;  Location: [facility] INVASIVE CV LAB;  Service: Cardiovascular;  Laterality: N/A;   CORONARY ULTRASOUND/IVUS N/A 07/08/2020   Procedure: Intravascular Ultrasound/IVUS;  Surgeon: Corky Crafts, MD;  Location: [facility] INVASIVE CV LAB;  Service: Cardiovascular;  Laterality: N/A;  LEFT HEART CATH AND CORONARY ANGIOGRAPHY N/A 07/07/2020   Procedure: LEFT HEART CATH AND CORONARY ANGIOGRAPHY;  Surgeon: Runell Gess, MD;  Location: [facility] INVASIVE CV LAB;  Service: Cardiovascular;  Laterality: N/A;   LEFT HEART CATH AND CORONARY ANGIOGRAPHY N/A 05/03/2023   Procedure: LEFT HEART CATH AND CORONARY ANGIOGRAPHY;  Surgeon: [place], Peter M, MD;  Location: [facility] INVASIVE CV LAB;  Service: Cardiovascular;  Laterality: N/A;   None      Current Medications: Current Meds  Medication Sig   acetaminophen (TYLENOL) 325 MG tablet Take 2  tablets (650 mg total) by mouth every 4 (four) hours as needed for headache or mild pain.   atorvastatin (LIPITOR) 80 MG tablet Take 1 tablet (80 mg total) by mouth daily.   Blood Glucose Monitoring Suppl (TRUE METRIX METER) w/Device KIT Use to test blood sugar 2 (two) times daily.   clopidogrel (PLAVIX) 75 MG tablet Take 1 tablet (75 mg total) by mouth daily.   Continuous Blood Gluc Sensor (FREESTYLE LIBRE 3 SENSOR) MISC Place 1 sensor on the skin every 14 days. Use to check glucose continuously   cyclobenzaprine (FLEXERIL) 5 MG tablet Take 1 tablet (5 mg total) by mouth 3 (three) times daily as needed for muscle spasms.   dorzolamide-timolol (COSOPT) 2-0.5 % ophthalmic solution Place 1 drop into both eyes 2 (two) times daily.   ezetimibe (ZETIA) 10 MG tablet Take 1 tablet (10 mg total) by mouth daily.   FARXIGA 10 MG TABS tablet Take 1 tablet (10 mg total) by mouth daily.   glucose blood test strip Use as instructed to test blood sugar 2 times daily   hydrochlorothiazide (HYDRODIURIL) 25 MG tablet Take 1/2 tablet (12.5 mg total) by mouth daily.   insulin glargine (LANTUS SOLOSTAR) 100 UNIT/ML Solostar Pen Inject 40 Units into the skin daily.   Insulin Pen Needle (PEN NEEDLES) 32G X 4 MM MISC Use as directed   Insulin Pen Needle 32G X 4 MM MISC Use up to 2 (two) times daily as directed   isosorbide mononitrate (IMDUR) 60 MG 24 hr tablet Take 1 tablet (60 mg total) by mouth daily.   ketorolac (ACULAR) 0.5 % ophthalmic solution Instill 1 drop into left eye four times a day STARTING 1 DAY BEFORE SURGERY   lisinopril (ZESTRIL) 40 MG tablet Take 1 tablet (40 mg total) by mouth daily.   metFORMIN (GLUCOPHAGE-XR) 500 MG 24 hr tablet Take 1 tablet (500 mg total) by mouth 2 (two) times daily with a meal.   metoprolol succinate (TOPROL-XL) 25 MG 24 hr tablet Take 1 tablet (25 mg total) by mouth daily.   nitroGLYCERIN (NITROSTAT) 0.4 MG SL tablet Place 1 tablet (0.4 mg total) under the tongue every 5 (five)  minutes as needed for chest pain.   ofloxacin (OCUFLOX) 0.3 % ophthalmic solution Instill 1 drop into left eye four times a day STARTING 1 DAY BEFORE SURGERY   pantoprazole (PROTONIX) 20 MG tablet Take 1 tablet (20 mg total) by mouth daily.   prednisoLONE acetate (PRED FORTE) 1 % ophthalmic suspension Place 1 drop into both eyes 4 (four) times daily.   sertraline (ZOLOFT) 50 MG tablet Take 1 tablet (50 mg total) by mouth at bedtime.   TRUEplus Lancets 28G MISC Use to test blood sugar 2 (two) times daily.     Allergies:   Patient has no active allergies.   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  Tobacco Use   Smoking status: Never   Smokeless tobacco: Never  Vaping Use   Vaping status: Never Used  Substance and Sexual Activity   Alcohol use: No   Drug use: No   Sexual activity: Not Currently  Other Topics Concern   Not on file  Social History Narrative   Not on file   Social Determinants of Health   Financial Resource Strain: Not on file  Food Insecurity: Not on file  Transportation Needs: Not on file  Physical Activity: Not on file  Stress: Not on file  Social Connections: Not on file     Family History: The patient's family history includes Breast cancer in her maternal grandmother and mother; Diabetes in her mother; Heart failure in her father; Hypertension in her father and mother; Stroke in her brother and mother. There is no history of Colon cancer, Stomach cancer, Rectal cancer, Esophageal cancer, or Liver cancer.  ROS:   Please see the history of present illness.     All other systems reviewed and are negative.  EKGs/Labs/Other Studies Reviewed:    The following studies were reviewed today:  Cardiac Studies & Procedures   CARDIAC CATHETERIZATION  CARDIAC  CATHETERIZATION 05/03/2023  Narrative   Prox RCA-1 lesion is 40% stenosed.   Prox RCA-2 lesion is 40% stenosed.   Dist RCA lesion is 50% stenosed.   Prox LAD to Mid LAD lesion is 20% stenosed.   Prox Cx to Dist Cx lesion is 25% stenosed.   Previously placed Mid LAD to Dist LAD stent of unknown type is  widely patent.   LV end diastolic pressure is normal.  Nonobstructive CAD. Continued excellent patency of stents in the LAD Normal LVEDP 13 mm Hg  Plan: continued medical therapy. Symptoms may be related to microvascular disease due to poorly controlled DM.  Findings Coronary Findings Diagnostic  Dominance: Right  Left Anterior Descending Prox LAD to Mid LAD lesion is 20% stenosed. The lesion was previously treated using a drug eluting stent over 2 years ago. Previously placed Mid LAD to Dist LAD stent of unknown type is  widely patent.  Left Circumflex Prox Cx to Dist Cx lesion is 25% stenosed.  Right Coronary Artery Prox RCA-1 lesion is 40% stenosed. Prox RCA-2 lesion is 40% stenosed. Dist RCA lesion is 50% stenosed. The lesion is segmental.  Intervention  No interventions have been documented.   CARDIAC CATHETERIZATION  CARDIAC CATHETERIZATION 07/08/2020  Narrative  Dist LAD lesion is 80% stenosed.  A drug-eluting stent was successfully placed using a STENT RESOLUTE ONYX 2.5X18, postdilated with a 3.0 [place] balloon and optimized wth IVUS.  Post intervention, there is a 0% residual stenosis.  Mid LAD lesion is 80% stenosed. Minimal calcification by IVUS so atherectomy was not required.  A drug-eluting stent was successfully placed using a STENT RESOLUTE ONYX 3.5X22, postdilated with a 4.0 [place] balloon and optimized wth IVUS.  Post intervention, there is a 0% residual stenosis.  Continue aggressive secondary prevention.  I stressed the importance of DAPT.  Findings Coronary Findings Diagnostic  Dominance: Co-dominant  Left Anterior Descending Mid LAD lesion is 80%  stenosed. The lesion is calcified. Ultrasound (IVUS) was performed. Severe plaque burden was detected. The lesion has a fibro-fatty core and a necrotic core. not much calcium noted by IVUS. Dist LAD lesion is  80% stenosed.  Intervention  Mid LAD lesion Stent CATH LAUNCHER 6FR EBU 3.75 guide catheter was inserted. Lesion crossed with guidewire using a WIRE ASAHI PROWATER 180CM. Pre-stent angioplasty was performed using a BALLOON SAPPHIRE 2.5X15. A drug-eluting stent was successfully placed using a STENT RESOLUTE ONYX 3.5X22. Stent strut is well apposed. Post-stent angioplasty was performed using a BALLOON SAPPHIRE [place] 4.0X12. IVUS was used to optimize the stent size, and performed before and after stenting with 5 Fr Opticross catheter.  Initially, there was plan for atherecetomy but given that there was not much calcium, we performed balloon and stenting. Post-Intervention Lesion Assessment The intervention was successful. Pre-interventional TIMI flow is 3. Post-intervention TIMI flow is 3. No complications occurred at this lesion. There is a 0% residual stenosis post intervention.  Dist LAD lesion Stent CATH LAUNCHER 6FR EBU 3.75 guide catheter was inserted. Lesion crossed with guidewire using a WIRE ASAHI PROWATER 180CM. Pre-stent angioplasty was performed using a BALLOON SAPPHIRE 2.5X15. A drug-eluting stent was successfully placed using a STENT RESOLUTE ONYX 2.5X18. Stent strut is well apposed. Post-stent angioplasty was performed using a BALLOON SAPPHIRE [place] 3.0X12. IVUS did not cross initially.  IVUS was done after stent placement due to haziness at distal edge of stent, but there was no evidence of edge dissection. Post-Intervention Lesion Assessment The intervention was successful. Pre-interventional TIMI flow is 3. Post-intervention TIMI flow is 3. No complications occurred at this lesion. There is a 0% residual stenosis post intervention.   STRESS TESTS  NM PET CT CARDIAC PERFUSION MULTI  W/ABSOLUTE BLOODFLOW 04/20/2023  Narrative   Normal perfusion. No increase in LVEF with stress. No TID. MBF abnormal, 1.25. Findings are either related to obstructive CAD or microvascular disease given the patient's poorly controlled diabetes.   LV perfusion is normal. There is no evidence of ischemia. There is no evidence of infarction.   Rest left ventricular function is normal. Rest EF: 72 %. Stress left ventricular function is normal. Stress EF: 72 %. End diastolic cavity size is normal.   Myocardial blood flow was computed to be 1.86ml/g/min at rest and 1.43ml/g/min at stress. Global myocardial blood flow reserve was 1.25 and was abnormal.   Coronary calcium assessment not performed due to prior revascularization.   The study is normal. The study is low risk.  Electronically signed by Lennie Odor, MD _____________________________________________________________________________________________________  Francia Greaves: OVER-READ INTERPRETATION  PET-CT CHEST  The following report is an over-read performed by radiologist Dr. Leatha Gilding [facility] on 04/20/2023. This over-read does not include interpretation of cardiac or coronary anatomy or pathology. The cardiac PET and cardiac CT interpretation by the cardiologist is to be attached.  COMPARISON:  None.  FINDINGS: No evidence for lymphadenopathy within the visualized mediastinum or hilar regions.  Patchy ground-glass opacity in the dependent lung bases is probably atelectatic. No pleural effusion.  Visualized portions of the upper abdomen are unremarkable.  No suspicious lytic or sclerotic osseous abnormality.  IMPRESSION: No acute or clinically significant extracardiac findings.   Electronically Signed By: Kennith Center M.D. On: 04/20/2023 11:28   ECHOCARDIOGRAM  ECHOCARDIOGRAM COMPLETE 07/07/2020  Narrative ECHOCARDIOGRAM REPORT    Patient Name:   TYRICA STETSER Ow Date of Exam: 07/07/2020 Medical Rec #:   [id number]          Height:       63.0 in Accession #:    [id number]         Weight:       250.2 lb Date of Birth:  [birth year]         BSA:          2.127 m Patient Age:    53 years           BP:           144/68 mmHg Patient Gender: F                  HR:           70 bpm. Exam Location:  Inpatient  Procedure: 2D Echo  Indications:    chest pain 786.50  History:        Patient has prior history of Echocardiogram examinations, most recent 08/19/2018. Risk Factors:Diabetes, Dyslipidemia and Hypertension.  Sonographer:    Delcie Roch Referring Phys: [id number] CALLIE E GOODRICH  IMPRESSIONS   1. Left ventricular ejection fraction, by estimation, is 60 to 65%. The left ventricle has normal function. The left ventricle has no regional wall motion abnormalities. There is mild concentric left ventricular hypertrophy. Left ventricular diastolic parameters were normal. 2. Right ventricular systolic function is normal. The right ventricular size is normal. Tricuspid regurgitation signal is inadequate for assessing PA pressure. 3. The mitral valve is grossly normal. Trivial mitral valve regurgitation. No evidence of mitral stenosis. 4. The aortic valve is tricuspid. Aortic valve regurgitation is not visualized. No aortic stenosis is present. 5. The inferior vena cava is normal in size with greater than 50% respiratory variability, suggesting right atrial pressure of 3 mmHg.  FINDINGS Left Ventricle: Left ventricular ejection fraction, by estimation, is 60 to 65%. The left ventricle has normal function. The left ventricle has no regional wall motion abnormalities. The left ventricular internal cavity size was normal in size. There is mild concentric left ventricular hypertrophy. Left ventricular diastolic parameters were normal. Normal left ventricular filling pressure.  Right Ventricle: The right ventricular size is normal. No increase in right ventricular wall thickness. Right ventricular  systolic function is normal. Tricuspid regurgitation signal is inadequate for assessing PA pressure.  Left Atrium: Left atrial size was normal in size.  Right Atrium: Right atrial size was normal in size.  Pericardium: Trivial pericardial effusion is present. Presence of pericardial fat pad.  Mitral Valve: The mitral valve is grossly normal. Trivial mitral valve regurgitation. No evidence of mitral valve stenosis.  Tricuspid Valve: The tricuspid valve is grossly normal. Tricuspid valve regurgitation is trivial. No evidence of tricuspid stenosis.  Aortic Valve: The aortic valve is tricuspid. Aortic valve regurgitation is not visualized. No aortic stenosis is present.  Pulmonic Valve: The pulmonic valve was grossly normal. Pulmonic valve regurgitation is not visualized.  Aorta: The aortic root and ascending aorta are structurally normal, with no evidence of dilitation.  Venous: The inferior vena cava is normal in size with greater than 50% respiratory variability, suggesting right atrial pressure of 3 mmHg.  IAS/Shunts: The atrial septum is grossly normal.   LEFT VENTRICLE PLAX 2D LVIDd:         4.00 cm  Diastology LVIDs:         2.60 cm  LV e' lateral:   7.40 cm/s LV PW:         1.20 cm  LV E/e' lateral: 14.5 LV IVS:        1.20 cm  LV e' medial:    7.29 cm/s LVOT diam:     2.00 cm  LV E/e' medial:  14.7 LV SV:         75 LV SV Index:  35 LVOT Area:     3.14 cm   RIGHT VENTRICLE             IVC RV S prime:     11.30 cm/s  IVC diam: 2.00 cm TAPSE (M-mode): 2.6 cm  LEFT ATRIUM             Index       RIGHT ATRIUM           Index LA diam:        4.00 cm 1.88 cm/m  RA Area:     13.00 cm LA Vol (A2C):   42.6 ml 20.03 ml/m RA Volume:   29.50 ml  13.87 ml/m LA Vol (A4C):   52.7 ml 24.78 ml/m LA Biplane Vol: 50.2 ml 23.60 ml/m AORTIC VALVE LVOT Vmax:   96.70 cm/s LVOT Vmean:  62.700 cm/s LVOT VTI:    0.240 m  AORTA Ao Root diam: 2.70 cm Ao Asc diam:  3.10  cm  MITRAL VALVE MV Area (PHT): 3.31 cm     SHUNTS MV Decel Time: 229 msec     Systemic VTI:  0.24 m MV E velocity: 107.00 cm/s  Systemic Diam: 2.00 cm MV A velocity: 101.00 cm/s MV E/A ratio:  1.06  Lennie Odor MD Electronically signed by Lennie Odor MD Signature Date/Time: 07/07/2020/3:27:27 PM    Final               EKG Interpretation Date/Time:  Wednesday July 13 2023 15:40:04 EDT Ventricular Rate:  87 PR Interval:  170 QRS Duration:  132 QT Interval:  420 QTC Calculation: 505 R Axis:   -25  Text Interpretation: Normal sinus rhythm Right bundle branch block When compared with ECG of 01-Jul-2023 15:26, PREVIOUS ECG IS PRESENT Confirmed by Micah Flesher ([id number]) on 07/13/2023 4:15:47 PM    Recent Labs: 02/17/2023: B Natriuretic Peptide 10.7 05/08/2023: ALT 13 07/01/2023: BUN 21; Creatinine, Ser 0.79; Hemoglobin 9.9; Platelets 451; Potassium 3.8; Sodium 137  Recent Lipid Panel    Component Value Date/Time   CHOL 129 12/28/2022 1130   TRIG 113 12/28/2022 1130   HDL 43 12/28/2022 1130   CHOLHDL 3.0 12/28/2022 1130   CHOLHDL 6.1 07/07/2020 0118   VLDL 30 07/07/2020 0118   LDLCALC 65 12/28/2022 1130     Risk Assessment/Calculations:                Physical Exam:    VS:  BP 116/72 (BP Location: Right Arm, Patient Position: Sitting, Cuff Size: Normal)   Pulse 87   Ht 5\' 3"  (1.6 m)   Wt 230 lb 12.8 oz (104.7 kg)   LMP 08/11/2016   SpO2 96%   BMI 40.88 kg/m     Wt Readings from Last 3 Encounters:  07/13/23 230 lb 12.8 oz (104.7 kg)  07/04/23 232 lb 3.2 oz (105.3 kg)  07/01/23 234 lb (106.1 kg)     GEN:  Well nourished, well developed in no acute distress HEENT: Normal NECK: No JVD; No carotid bruits LYMPHATICS: No lymphadenopathy CARDIAC: RRR, no murmurs, rubs, gallops RESPIRATORY:  Clear to auscultation without rales, wheezing or rhonchi  ABDOMEN: Soft, non-tender, non-distended MUSCULOSKELETAL:  No edema; No deformity  SKIN: Warm and  dry NEUROLOGIC:  Alert and oriented x 3 PSYCHIATRIC:  Normal affect   ASSESSMENT:    1. Coronary artery disease involving native coronary artery of native heart without angina pectoris   2. CAD S/P percutaneous coronary angioplasty   3. Hyperlipidemia with target LDL  less than 70   4. Primary hypertension   5. Diabetes mellitus due to underlying condition with chronic kidney disease, without long-term current use of insulin, unspecified CKD stage (HCC)   6. OSA on CPAP    PLAN:    In order of problems listed above:  CAD She is s/p PCI with DES x2 to LAD in 2021.  She has remained on aspirin and Plavix with history of CVA and now PCI.  Continue Toprol 25 mg, imdur, lipitor, Zetia, farxiga. No chest pain.    Hyperlipidemia with LDL goal less than 70 Continue 80 mg lipitor and 10 mg Zetia. 12/28/2022: Cholesterol, Total 129; HDL 43; LDL Chol Calc (NIH) 65; Triglycerides 113   Hypertension Maintained on 12.5 mg HCTZ, 40 mg lisinopril, 25 mg toprol, 60 mg imdur BP well controlled   DM with hyperglycemia A1c > 14% 05/2022 --> 11.4% 05/2023 Peripheral neuropathy Now on farxiga with insulin   OSA on CPAP - compliant     Follow up on 4-6 months.       Medication Adjustments/Labs and Tests Ordered: Current medicines are reviewed at length with the patient today.  Concerns regarding medicines are outlined above.  Orders Placed This Encounter  Procedures   EKG 12-Lead   No orders of the defined types were placed in this encounter.   Patient Instructions  Medication Instructions:  - No changes  *If you need a refill on your cardiac medications before your next appointment, please call your pharmacy*   Lab Work: - None ordered  Testing/Procedures: - None ordered  Follow-Up: At [facility], you and your health needs are our priority.  As part of our continuing mission to provide you with exceptional heart care, we have created designated Provider Care  Teams.  These Care Teams include your primary Cardiologist (physician) and Advanced Practice Providers (APPs -  Physician Assistants and Nurse Practitioners) who all work together to provide you with the care you need, when you need it.  We recommend signing up for the patient portal called "MyChart".  Sign up information is provided on this After Visit Summary.  MyChart is used to connect with patients for Virtual Visits (Telemedicine).  Patients are able to view lab/test results, encounter notes, upcoming appointments, etc.  Non-urgent messages can be sent to your provider as well.   To learn more about what you can do with MyChart, go to [email or web address].    Your next appointment:   4-6 month(s)  Provider:   Parke Poisson, MD      Signed, Roe Ruther[place] , [place]  07/13/2023 4:43 PM    [facility]

## 2023-07-13 NOTE — Patient Instructions (Signed)
Medication Instructions:  - No changes  *If you need a refill on your cardiac medications before your next appointment, please call your pharmacy*   Lab Work: - None ordered  Testing/Procedures: - None ordered  Follow-Up: At [facility], you and your health needs are our priority.  As part of our continuing mission to provide you with exceptional heart care, we have created designated Provider Care Teams.  These Care Teams include your primary Cardiologist (physician) and Advanced Practice Providers (APPs -  Physician Assistants and Nurse Practitioners) who all work together to provide you with the care you need, when you need it.  We recommend signing up for the patient portal called "MyChart".  Sign up information is provided on this After Visit Summary.  MyChart is used to connect with patients for Virtual Visits (Telemedicine).  Patients are able to view lab/test results, encounter notes, upcoming appointments, etc.  Non-urgent messages can be sent to your provider as well.   To learn more about what you can do with MyChart, go to [email or web address].    Your next appointment:   4-6 month(s)  Provider:   Parke Poisson, MD

## 2023-07-22 ENCOUNTER — Other Ambulatory Visit (HOSPITAL_COMMUNITY): Payer: Self-pay

## 2023-07-25 ENCOUNTER — Other Ambulatory Visit (HOSPITAL_COMMUNITY): Payer: Self-pay

## 2023-07-25 MED ORDER — PREDNISOLONE ACETATE 1 % OP SUSP
1.0000 [drp] | Freq: Four times a day (QID) | OPHTHALMIC | 2 refills | Status: DC
  Filled 2023-07-25: qty 10, 25d supply, fill #0

## 2023-07-28 NOTE — Progress Notes (Signed)
 Internal Medicine Clinic Attending  I was physically present during the key portions of the resident provided service and participated in the medical decision making of patient's management care. I reviewed pertinent patient test results.  The assessment, diagnosis, and plan were formulated together and I agree with the documentation in the resident's note.  Inez Catalina, MD

## 2023-08-13 ENCOUNTER — Other Ambulatory Visit: Payer: Self-pay

## 2023-08-13 ENCOUNTER — Other Ambulatory Visit: Payer: Self-pay | Admitting: Student

## 2023-08-13 DIAGNOSIS — Z794 Long term (current) use of insulin: Secondary | ICD-10-CM

## 2023-08-15 ENCOUNTER — Other Ambulatory Visit (HOSPITAL_COMMUNITY): Payer: Self-pay

## 2023-08-15 ENCOUNTER — Other Ambulatory Visit: Payer: Self-pay | Admitting: Student

## 2023-08-15 MED ORDER — BASAGLAR KWIKPEN 100 UNIT/ML ~~LOC~~ SOPN
40.0000 [IU] | PEN_INJECTOR | Freq: Every day | SUBCUTANEOUS | 3 refills | Status: DC
Start: 2023-08-15 — End: 2023-10-10
  Filled 2023-08-15: qty 15, 37d supply, fill #0

## 2023-08-15 MED FILL — Insulin Glargine Soln Pen-Injector 100 Unit/ML: SUBCUTANEOUS | 30 days supply | Qty: 12 | Fill #0 | Status: CN

## 2023-08-15 NOTE — Progress Notes (Signed)
Lantus no longer covered under patient's insurance, will switch to [employer].

## 2023-08-17 ENCOUNTER — Other Ambulatory Visit (HOSPITAL_COMMUNITY): Payer: Self-pay

## 2023-08-22 ENCOUNTER — Other Ambulatory Visit: Payer: Self-pay | Admitting: Cardiology

## 2023-08-23 ENCOUNTER — Other Ambulatory Visit: Payer: Self-pay

## 2023-08-23 ENCOUNTER — Other Ambulatory Visit (HOSPITAL_COMMUNITY): Payer: Self-pay

## 2023-08-24 ENCOUNTER — Other Ambulatory Visit (HOSPITAL_COMMUNITY): Payer: Self-pay

## 2023-08-24 ENCOUNTER — Encounter (HOSPITAL_COMMUNITY): Payer: Self-pay

## 2023-09-03 ENCOUNTER — Other Ambulatory Visit: Payer: Self-pay | Admitting: Internal Medicine

## 2023-09-03 DIAGNOSIS — E785 Hyperlipidemia, unspecified: Secondary | ICD-10-CM

## 2023-09-14 ENCOUNTER — Other Ambulatory Visit: Payer: Self-pay

## 2023-09-14 DIAGNOSIS — E785 Hyperlipidemia, unspecified: Secondary | ICD-10-CM

## 2023-09-14 MED ORDER — ATORVASTATIN CALCIUM 80 MG PO TABS: 80.0000 mg | ORAL_TABLET | Freq: Every day | ORAL | 3 refills | Status: DC

## 2023-10-06 ENCOUNTER — Encounter: Payer: Self-pay | Admitting: *Deleted

## 2023-10-06 DIAGNOSIS — Z006 Encounter for examination for normal comparison and control in clinical research program: Secondary | ICD-10-CM

## 2023-10-06 NOTE — Research (Signed)
Participant ID: [id number]  LP(a) Result: 518.3 nmol/L  Date Resulted:  August 03, 2023  Date of Coaching: 10-06-2023   [x]  (Please check once complete) Discussed Lp(a) result with participant:    What is Lipoprotein(a) Lp(a)?  What is a "normal" Lp(a) level, and when should I be concerned?  How is Lp(a) related to "bad cholesterol?"  Does a high Lp(a) level increase my risk of heart disease?  How are Lp(a) levels inherited?  Do Lp(a) levels vary by race or ethnicity?  Diagnosing High Lp(a)  What are the signs of high Lp(a)?  How to get an Lp(a) test?  What Lp(a) results are considered high? How do I get a diagnosis of elevated lipoprotein(a)?  How can I lower my Lp(a)?    During the return of results coaching session, all the topics listed above were reviewed with the participant as per the documents: Guidance for Clinician-Patient Lp(a) Risk Assessment Discussion African American Heart Study and Micro-Learning Session: High Lipoprotein(a) 101.  Participant verbalized understanding of test results and what to do next: to ask their healthcare practitioner to test Lp(a) level, advise first-degree relatives to be screened, and work to reduce all cardiovascular risk factors within their control, especially LDL cholesterol.

## 2023-10-10 ENCOUNTER — Other Ambulatory Visit: Payer: Self-pay

## 2023-10-10 ENCOUNTER — Encounter: Payer: Self-pay | Admitting: Student

## 2023-10-10 ENCOUNTER — Ambulatory Visit (INDEPENDENT_AMBULATORY_CARE_PROVIDER_SITE_OTHER): Payer: Self-pay | Admitting: Student

## 2023-10-10 ENCOUNTER — Telehealth: Payer: Self-pay

## 2023-10-10 ENCOUNTER — Other Ambulatory Visit (HOSPITAL_COMMUNITY): Payer: Self-pay

## 2023-10-10 VITALS — BP 127/69 | HR 61 | Temp 97.9°F | Ht 63.0 in | Wt 236.8 lb

## 2023-10-10 DIAGNOSIS — I10 Essential (primary) hypertension: Secondary | ICD-10-CM | POA: Diagnosis not present

## 2023-10-10 DIAGNOSIS — Z794 Long term (current) use of insulin: Secondary | ICD-10-CM | POA: Diagnosis not present

## 2023-10-10 DIAGNOSIS — E118 Type 2 diabetes mellitus with unspecified complications: Secondary | ICD-10-CM | POA: Diagnosis not present

## 2023-10-10 DIAGNOSIS — Z7984 Long term (current) use of oral hypoglycemic drugs: Secondary | ICD-10-CM

## 2023-10-10 LAB — POCT GLYCOSYLATED HEMOGLOBIN (HGB A1C): Hemoglobin A1C: 11.2 % — AB (ref 4.0–5.6)

## 2023-10-10 LAB — GLUCOSE, CAPILLARY: Glucose-Capillary: 204 mg/dL — ABNORMAL HIGH (ref 70–99)

## 2023-10-10 MED ORDER — TRULICITY 0.75 MG/0.5ML ~~LOC~~ SOAJ
0.7500 mg | SUBCUTANEOUS | 5 refills | Status: DC
Start: 2023-10-10 — End: 2024-01-09
  Filled 2023-10-10: qty 2, 28d supply, fill #0

## 2023-10-10 MED ORDER — BASAGLAR KWIKPEN 100 UNIT/ML ~~LOC~~ SOPN
45.0000 [IU] | PEN_INJECTOR | Freq: Every day | SUBCUTANEOUS | 3 refills | Status: DC
Start: 2023-10-10 — End: 2024-01-09
  Filled 2023-10-10 – 2023-10-24 (×2): qty 15, 33d supply, fill #0
  Filled 2023-12-06 – 2023-12-29 (×2): qty 15, 33d supply, fill #1

## 2023-10-10 MED ORDER — DEXCOM G7 SENSOR MISC
1.0000 | 5 refills | Status: DC
Start: 2023-10-10 — End: 2023-10-14
  Filled 2023-10-10 – 2023-10-12 (×2): qty 3, 30d supply, fill #0

## 2023-10-10 NOTE — Telephone Encounter (Signed)
Prior Authorization for patient (Dexcom G7 Sensor) came through on cover my meds was submitted with last office notes and labs awaiting approval or denial.  KEY:[id number]

## 2023-10-10 NOTE — Patient Instructions (Addendum)
Thank you so much for coming to the clinic today!   I have increased your Lantus to 45 units, please start tonight. I have also sent in a medication called Trulicity (unfortunately Ozempic is not covered by your insurance).   If you have any questions please feel free to the call the clinic at anytime at [phone]. It was a pleasure seeing you!  Best, Dr. Thomasene Ripple

## 2023-10-10 NOTE — Progress Notes (Signed)
CC: Diabetes follow up  HPI:  [name] is a 57 y.o. female living with a history stated below and presents today for diabetes follow-up. Please see problem based assessment and plan for additional details.  Past Medical History:  Diagnosis Date   CAD in native artery 07/09/2020   Coronary artery disease    Diabetes mellitus    Generalized pruritus 05/13/2023   Generalized pruritus 05/13/2023   GERD (gastroesophageal reflux disease)    Hyperlipemia    Hypertension    Precordial chest pain    S/P angioplasty with stent 07/09/2020   Shingles 07/04/2023   Stroke [facility])     Current Outpatient Medications on File Prior to Visit  Medication Sig Dispense Refill   atorvastatin (LIPITOR) 80 MG tablet Take 1 tablet (80 mg total) by mouth daily. 90 tablet 3   Blood Glucose Monitoring Suppl (TRUE METRIX METER) w/Device KIT Use to test blood sugar 2 (two) times daily. 1 kit 0   clopidogrel (PLAVIX) 75 MG tablet Take 1 tablet (75 mg total) by mouth daily. 30 tablet 11   dorzolamide-timolol (COSOPT) 2-0.5 % ophthalmic solution Place 1 drop into both eyes 2 (two) times daily.     ezetimibe (ZETIA) 10 MG tablet Take 1 tablet (10 mg total) by mouth daily. 90 tablet 3   FARXIGA 10 MG TABS tablet Take 1 tablet (10 mg total) by mouth daily. 30 tablet 6   glucose blood test strip Use as instructed to test blood sugar 2 times daily 100 each 12   hydrochlorothiazide (HYDRODIURIL) 25 MG tablet Take 1/2 tablet (12.5 mg total) by mouth daily. 45 tablet 3   Insulin Pen Needle (PEN NEEDLES) 32G X 4 MM MISC Use as directed 100 each 0   Insulin Pen Needle 32G X 4 MM MISC Use up to 2 (two) times daily as directed 100 each 3   isosorbide mononitrate (IMDUR) 60 MG 24 hr tablet Take 1 tablet (60 mg total) by mouth daily. 90 tablet 3   ketorolac (ACULAR) 0.5 % ophthalmic solution Instill 1 drop into left eye four times a day STARTING 1 DAY BEFORE SURGERY 5 mL 1   lisinopril (ZESTRIL) 40 MG tablet Take  1 tablet (40 mg total) by mouth daily. 90 tablet 3   metFORMIN (GLUCOPHAGE-XR) 500 MG 24 hr tablet Take 1 tablet (500 mg total) by mouth 2 (two) times daily with a meal. 60 tablet 2   metoprolol succinate (TOPROL-XL) 25 MG 24 hr tablet Take 1 tablet (25 mg total) by mouth daily. 90 tablet 3   nitroGLYCERIN (NITROSTAT) 0.4 MG SL tablet Place 1 tablet (0.4 mg total) under the tongue every 5 (five) minutes as needed for chest pain. 25 tablet 4   ofloxacin (OCUFLOX) 0.3 % ophthalmic solution Instill 1 drop into left eye four times a day STARTING 1 DAY BEFORE SURGERY 5 mL 1   pantoprazole (PROTONIX) 20 MG tablet Take 1 tablet (20 mg total) by mouth daily. 90 tablet 2   prednisoLONE acetate (PRED FORTE) 1 % ophthalmic suspension Place 1 drop into both eyes 4 (four) times daily. 10 mL 1   prednisoLONE acetate (PRED FORTE) 1 % ophthalmic suspension Place 1 drop into both eyes 4 (four) times daily. 10 mL 2   sertraline (ZOLOFT) 50 MG tablet Take 1 tablet (50 mg total) by mouth at bedtime. 90 tablet 0   TRUEplus Lancets 28G MISC Use to test blood sugar 2 (two) times daily. 100 each 0  valACYclovir (VALTREX) 1000 MG tablet Take 1 tablet (1,000 mg total) by mouth 3 (three) times daily. (Patient not taking: Reported on 07/13/2023) 21 tablet 0   No current facility-administered medications on file prior to visit.    Family History  Problem Relation Age of Onset   Diabetes Mother    Hypertension Mother    Breast cancer Mother    Stroke Mother    Hypertension Father    Heart failure Father    Breast cancer Maternal Grandmother    Stroke Brother    Colon cancer Neg Hx    Stomach cancer Neg Hx    Rectal cancer Neg Hx    Esophageal cancer Neg Hx    Liver cancer Neg Hx     Social History   Socioeconomic History   Marital status: Single    Spouse name: Not on file   Number of children: 2   Years of education: 12   Highest education level: Not on file  Occupational History   Occupation: [employer]- [employer]  Tobacco Use   Smoking status: Never   Smokeless tobacco: Never  Vaping Use   Vaping status: Never Used  Substance and Sexual Activity   Alcohol use: No   Drug use: No   Sexual activity: Not Currently  Other Topics Concern   Not on file  Social History Narrative   Not on file   Social Determinants of Health   Financial Resource Strain: Not on file  Food Insecurity: Not on file  Transportation Needs: Not on file  Physical Activity: Not on file  Stress: Not on file  Social Connections: Not on file  Intimate Partner Violence: Not on file    Review of Systems: ROS negative except for what is noted on the assessment and plan.  Vitals:   10/10/23 0952 10/10/23 0954  BP: 95/69 127/69  Pulse: 76 61  Temp: 97.9 F (36.6 C)   TempSrc: Oral   SpO2: 98%   Weight: 236 lb 12.8 oz (107.4 kg)   Height: 5\' 3"  (1.6 m)     Physical Exam: Constitutional: well-appearing female in no acute distress Cardiovascular: regular rate and rhythm, no m/r/g Pulmonary/Chest: normal work of breathing on room air, lungs clear to auscultation bilaterally Abdominal: soft, non-tender, non-distended   Assessment & Plan:   Type 2 diabetes mellitus with complication, with long-term current use of insulin (HCC) Pt presents for follow up regarding her diabetes. Her last A1c in August of this year was 11.4, today is 11.2. Her regimen is Metformin 500mg  BID, Lantus 40U, Farxiga 10mg  daily. She uses a fingerstick to check her sugars and she states it's usually in the 200 range, fasting and post-prandial. She denies any symptoms of polyuria or polydipsia. Her diet has switched to baked instead of fried, and she has started incorporating walking into her lifestyle.   At this time, she will need increased glycemic control. So will increase Insulin to 45U, and she will greatly benefit from a GLP-1 for glycemic control as well as for her obesity. Unfortunately, Ozempic is not covered by her  insurance, so will trial Trulicity weekly, and slowly titrate up.   Plan:  - Increase Lantus to 45 units - Start Trulicity 0.75 mg weekly - Sent in Dexcom for glucose monitoring - Continue metformin 500 mg twice a day - Continue Farxiga 10 mg  Benign essential HTN BP at goal today at 127/69. Continuing regimen of hydrochlorothiazide 12.5mg  daily, Lisinopril 40mg  daily, and Toprol-XL 25mg   daily.    Patient discussed with Dr. Lanelle Bal Niclas Markell, M.D. [facility] Internal Medicine, PGY-2 Pager: [phone] Date 10/10/2023 Time 11:03 AM

## 2023-10-10 NOTE — Assessment & Plan Note (Signed)
BP at goal today at 127/69. Continuing regimen of hydrochlorothiazide 12.5mg  daily, Lisinopril 40mg  daily, and Toprol-XL 25mg  daily.

## 2023-10-10 NOTE — Assessment & Plan Note (Addendum)
Pt presents for follow up regarding her diabetes. Her last A1c in August of this year was 11.4, today is 11.2. Her regimen is Metformin 500mg  BID, Lantus 40U, Farxiga 10mg  daily. She uses a fingerstick to check her sugars and she states it's usually in the 200 range, fasting and post-prandial. She denies any symptoms of polyuria or polydipsia. Her diet has switched to baked instead of fried, and she has started incorporating walking into her lifestyle.   At this time, she will need increased glycemic control. So will increase Insulin to 45U, and she will greatly benefit from a GLP-1 for glycemic control as well as for her obesity. Unfortunately, Ozempic is not covered by her insurance, so will trial Trulicity weekly, and slowly titrate up.   Plan:  - Increase Lantus to 45 units - Start Trulicity 0.75 mg weekly - Sent in Dexcom for glucose monitoring - Continue metformin 500 mg twice a day - Continue Farxiga 10 mg

## 2023-10-12 ENCOUNTER — Other Ambulatory Visit (HOSPITAL_COMMUNITY): Payer: Self-pay

## 2023-10-12 NOTE — Progress Notes (Signed)
Internal Medicine Clinic Attending  Case discussed with the resident at the time of the visit.  We reviewed the resident's history and exam and pertinent patient test results.  I agree with the assessment, diagnosis, and plan of care documented in the resident's note.  Debe Coder, MD

## 2023-10-13 NOTE — Telephone Encounter (Signed)
Decision:Denied On 10/10/2023, we received an authorization request from your prescriber for coverage of Dexcom G7 (All Products) for you. This letter is to notify you that we have denied the request because: Criteria for medical necessity were not met. Your records with [place] and the information submitted by your doctor do not show you meet the following criteria: (1) require long-term continuous glucose (blood sugar) monitoring as part of your diabetes (high blood sugar) management, as evidenced by both of the following: a) consistently check blood sugar at least three times per day, and b) adherent to a diabetic treatment plan and can be trained to use a continuous glucose monitor (CGM); (2) have one of the following: a) type 1 diabetes (high blood sugar levels due to the body not making insulin), b) type 2 diabetes mellitus (high blood sugar levels) treated with basal insulin, with a hemoglobin A1c (a blood test that is an indicator of blood sugar levels over the past 3 months) above goal, despite appropriate changes in insulin therapy and compliance with the treatment plan, c) use at least three insulin injections per day or an insulin pump, or d) problematic hypoglycemia (low blood sugar); and (3) trial and failure or are unable to use the [facility] continuous glucose monitoring system (may be requested through a prior authorization).

## 2023-10-14 ENCOUNTER — Other Ambulatory Visit: Payer: Self-pay | Admitting: Student

## 2023-10-14 ENCOUNTER — Telehealth: Payer: Self-pay

## 2023-10-14 ENCOUNTER — Other Ambulatory Visit (HOSPITAL_COMMUNITY): Payer: Self-pay

## 2023-10-14 MED ORDER — FREESTYLE LIBRE 3 SENSOR MISC
3 refills | Status: DC
  Filled 2023-10-14 – 2023-10-28 (×2): qty 2, 28d supply, fill #0

## 2023-10-14 NOTE — Telephone Encounter (Signed)
Prior Authorization for patient (Trulicity 0.75MG /0.5ML auto-injectors) came through on cover my meds was submitted with last office notes and labs awaiting approval or denial.  WUJ:[id number]

## 2023-10-14 NOTE — Telephone Encounter (Signed)
Prior Authorization for patient (FreeStyle Libre 3 Sensor) came through on cover my meds was submitted with last office notes and labs awaiting approval or denial.  ZOX:[id number]

## 2023-10-17 NOTE — Telephone Encounter (Signed)
Decision:Denied On 10/14/2023, we received an authorization request from your prescriber for coverage of FreeStyle Libre 3 Sensor XX MISC for you. This letter is to notify you that we have denied the request because: Criteria for medical necessity were not met. Your records with [place] and the information submitted by your doctor do not show you meet the following criteria: (1) require long-term continuous glucose (blood sugar) monitoring as part of your diabetes (high blood sugar) management, as evidenced by both of the following: a) consistently check blood sugar at least three times per day, and b) adherent to a diabetic treatment plan and can be trained to use a continuous glucose monitor (CGM); and (2) have one of the following: a) type 1 diabetes (high blood sugar levels due to the body not making insulin), b) type 2 diabetes mellitus (high blood sugar levels) treated with basal insulin, with a hemoglobin A1c (a blood test that is an indicator of blood sugar levels over the past 3 months) above goal, despite appropriate changes in insulin therapy and compliance with the treatment plan, c) use at least three insulin injections per day or an insulin pump, d) pregnant, or e) problematic hypoglycemia (low blood sugar).

## 2023-10-17 NOTE — Telephone Encounter (Signed)
Decision:Approved Kamdyn Mahan (Key: [id number]) PA Case ID #: [id number] Rx #: [id number] Need Help? Call us at [phone] Outcome Approved on November 17 by [employer] NCPDP 2017 Your PA request has been approved. Additional information will be provided in the approval communication. (Message 1145) Authorization Expiration Date: 10/14/2024 Drug Trulicity 0.75MG /0.5ML auto-injectors ePA cloud Psychologist, educational Electronic PA Form [phone] NCPDP) Original Claim Info 75 PA REQUIREDDRUG REQUIRES PRIOR AUTHORIZATION(PHARMACY HELP DESK [phone])

## 2023-10-19 ENCOUNTER — Telehealth: Payer: Self-pay | Admitting: Pharmacy Technician

## 2023-10-19 ENCOUNTER — Other Ambulatory Visit (HOSPITAL_COMMUNITY): Payer: Self-pay

## 2023-10-19 NOTE — Telephone Encounter (Signed)
Pharmacy Patient Advocate Encounter   Received notification from CoverMyMeds that prior authorization for ezetimibe is required/requested.   Insurance verification completed.   The patient is insured through  [employer]  .   Per test claim: PA required; PA submitted to above mentioned insurance via CoverMyMeds Key/confirmation #/EOC [id number] Status is pending

## 2023-10-20 ENCOUNTER — Other Ambulatory Visit (HOSPITAL_COMMUNITY): Payer: Self-pay

## 2023-10-21 NOTE — Telephone Encounter (Signed)
Pharmacy Patient Advocate Encounter  Received notification from  [employer]  that Prior Authorization for ezetimibe has been APPROVED from 10/20/23 to 10/19/24   PA #/Case ID/Reference #: [id number]

## 2023-10-24 ENCOUNTER — Other Ambulatory Visit (HOSPITAL_COMMUNITY): Payer: Self-pay

## 2023-10-26 ENCOUNTER — Other Ambulatory Visit (HOSPITAL_COMMUNITY): Payer: Self-pay

## 2023-10-28 ENCOUNTER — Other Ambulatory Visit (HOSPITAL_COMMUNITY): Payer: Self-pay

## 2023-10-29 ENCOUNTER — Other Ambulatory Visit: Payer: Self-pay

## 2023-10-29 ENCOUNTER — Other Ambulatory Visit: Payer: Self-pay | Admitting: Cardiology

## 2023-10-31 ENCOUNTER — Other Ambulatory Visit (HOSPITAL_COMMUNITY): Payer: Self-pay

## 2023-10-31 ENCOUNTER — Other Ambulatory Visit: Payer: Self-pay

## 2023-10-31 DIAGNOSIS — E118 Type 2 diabetes mellitus with unspecified complications: Secondary | ICD-10-CM

## 2023-11-01 ENCOUNTER — Other Ambulatory Visit (HOSPITAL_COMMUNITY): Payer: Self-pay

## 2023-11-01 ENCOUNTER — Telehealth: Payer: Self-pay

## 2023-11-01 MED ORDER — FARXIGA 10 MG PO TABS
10.0000 mg | ORAL_TABLET | Freq: Every day | ORAL | 3 refills | Status: DC
Start: 2023-11-01 — End: 2024-01-09
  Filled 2023-11-01 – 2023-11-08 (×2): qty 30, 30d supply, fill #0

## 2023-11-01 NOTE — Telephone Encounter (Signed)
Prior Authorization for patient Cindy [name] 10MG  tablets) came through on cover my meds was submitted with last office notes and labs awaiting approval or denial  WGN:[id number]

## 2023-11-02 ENCOUNTER — Other Ambulatory Visit (HOSPITAL_COMMUNITY): Payer: Self-pay

## 2023-11-02 MED ORDER — METFORMIN HCL ER 500 MG PO TB24
500.0000 mg | ORAL_TABLET | Freq: Two times a day (BID) | ORAL | 2 refills | Status: DC
  Filled 2023-11-02: qty 60, 30d supply, fill #0

## 2023-11-03 NOTE — Telephone Encounter (Signed)
Decision:Approved Silvana Pohle (KeyCrissie Reese) PA Case ID #: [id number] Rx #: [id number] Need Help? Call us at [phone] Outcome Approved today by [facility] NCPDP 2017 Your PA request has been approved. Additional information will be provided in the approval communication. (Message 1145) Authorization Expiration Date: 11/01/2024 Drug Farxiga 10MG  tablets ePA cloud Psychologist, educational Electronic PA Form [id number] NCPDP)

## 2023-11-09 ENCOUNTER — Other Ambulatory Visit (HOSPITAL_COMMUNITY): Payer: Self-pay

## 2023-11-10 ENCOUNTER — Other Ambulatory Visit: Payer: Self-pay

## 2023-11-10 DIAGNOSIS — E785 Hyperlipidemia, unspecified: Secondary | ICD-10-CM

## 2023-11-10 MED ORDER — CLOPIDOGREL BISULFATE 75 MG PO TABS
75.0000 mg | ORAL_TABLET | Freq: Every day | ORAL | 2 refills | Status: DC
Start: 2023-11-10 — End: 2024-07-09

## 2023-11-11 ENCOUNTER — Other Ambulatory Visit (HOSPITAL_COMMUNITY): Payer: Self-pay

## 2023-12-07 ENCOUNTER — Other Ambulatory Visit (HOSPITAL_COMMUNITY): Payer: Self-pay

## 2023-12-19 ENCOUNTER — Other Ambulatory Visit (HOSPITAL_COMMUNITY): Payer: Self-pay

## 2023-12-29 ENCOUNTER — Other Ambulatory Visit (HOSPITAL_COMMUNITY): Payer: Self-pay

## 2024-01-05 ENCOUNTER — Other Ambulatory Visit (HOSPITAL_COMMUNITY)
Admission: RE | Admit: 2024-01-05 | Discharge: 2024-01-05 | Disposition: A | Discharge status: Home / Self Care | Payer: 59 | Source: Ambulatory Visit

## 2024-01-05 ENCOUNTER — Ambulatory Visit (INDEPENDENT_AMBULATORY_CARE_PROVIDER_SITE_OTHER): Payer: 59

## 2024-01-05 VITALS — BP 160/89 | HR 69 | Wt 241.6 lb

## 2024-01-05 DIAGNOSIS — Z124 Encounter for screening for malignant neoplasm of cervix: Secondary | ICD-10-CM

## 2024-01-05 DIAGNOSIS — Z8719 Personal history of other diseases of the digestive system: Secondary | ICD-10-CM | POA: Diagnosis not present

## 2024-01-05 DIAGNOSIS — Z01419 Encounter for gynecological examination (general) (routine) without abnormal findings: Secondary | ICD-10-CM

## 2024-01-05 DIAGNOSIS — Z1239 Encounter for other screening for malignant neoplasm of breast: Secondary | ICD-10-CM | POA: Diagnosis not present

## 2024-01-05 NOTE — Progress Notes (Signed)
 Pt presents for new GYN visit to establish care. Pap today. Does not remember the last time. No questions or concerns. Declines STD testing.

## 2024-01-05 NOTE — Progress Notes (Signed)
 GYNECOLOGY OFFICE VISIT NOTE-WELL WOMAN EXAM  History:   Cindy [name] is a 58 year old here today for annual exam. She is accompanied by her daughter, Angelica.  Ms. Meinhart denies bleeding, pelvic pain, or vaginal discharge. She reports her LMP was November 2024.  Ms. Rothenberger also reports that she had been having periods at least once a year for the past 3 years.   Birth Control:  None  Reproductive Concerns Sexually Active: No  Obstetrical History: [id number]  Gynecological History: No history Vaginal/GU Concerns: She denies issues with urination.  She reports issues with constipation, but had bowel movement yesterday. Breast Concerns/Exams: Patient reports mother and maternal aunts with h/o breast cancer. She reports she has not been tested for BRAC1 gene.  She does not perform breast exams. She denies other amily history of breast, uterine, cervical, or ovarian cancer  Medical and Nutrition ERE:Wnnmliipw; Last appt Jan 2025 Significant PMx: HTN and T2DM on insulin  Exercise: Walking at work Tobacco/Drugs/Alcohol: None Nutrition:It could be better.   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 Social Support: Endorses. Daughter present today.  Employment: Group home  Past Medical History:  Diagnosis Date   CAD in native artery 07/09/2020   Coronary artery disease    Diabetes mellitus    Generalized pruritus 05/13/2023   Generalized pruritus 05/13/2023   GERD (gastroesophageal reflux disease)    Hyperlipemia    Hypertension    Precordial chest pain    S/P angioplasty with stent 07/09/2020   Shingles 07/04/2023   Stroke [facility])     Past Surgical History:  Procedure Laterality Date   CARDIAC CATHETERIZATION     CORONARY STENT INTERVENTION N/A 07/08/2020   Procedure: CORONARY STENT INTERVENTION;  Surgeon: Dann Candyce RAMAN, MD;  Location: [facility] INVASIVE CV LAB;  Service: Cardiovascular;  Laterality: N/A;   CORONARY ULTRASOUND/IVUS N/A 07/08/2020    Procedure: Intravascular Ultrasound/IVUS;  Surgeon: Dann Candyce RAMAN, MD;  Location: [facility] INVASIVE CV LAB;  Service: Cardiovascular;  Laterality: N/A;   LEFT HEART CATH AND CORONARY ANGIOGRAPHY N/A 07/07/2020   Procedure: LEFT HEART CATH AND CORONARY ANGIOGRAPHY;  Surgeon: Court Dorn PARAS, MD;  Location: [facility] INVASIVE CV LAB;  Service: Cardiovascular;  Laterality: N/A;   LEFT HEART CATH AND CORONARY ANGIOGRAPHY N/A 05/03/2023   Procedure: LEFT HEART CATH AND CORONARY ANGIOGRAPHY;  Surgeon: Jordan, Peter M, MD;  Location: [facility] INVASIVE CV LAB;  Service: Cardiovascular;  Laterality: N/A;   None      The following portions of the patient's history were reviewed and updated as appropriate: allergies, current medications, past family history, past medical history, past social history, past surgical history and problem list.  Normal Health Maintenance: Pap: 08/2010, Normal Results.  Mammogram: April 2024.  Colonoscopy: March 2018-polyps removed. No follow up Review of Systems:  Pertinent items noted in HPI and remainder of comprehensive ROS otherwise negative.    Objective:    Physical Exam BP (!) 160/89   Pulse 69   Wt 241 lb 9.6 oz (109.6 kg)   LMP 08/11/2016   BMI 42.80 kg/m  Physical Exam Vitals reviewed. Exam conducted with a chaperone present.  Constitutional:      Appearance: Normal appearance.  HENT:     Head: Normocephalic and atraumatic.  Eyes:     Conjunctiva/sclera: Conjunctivae normal.  Cardiovascular:     Rate and Rhythm: Normal rate and regular rhythm.  Pulmonary:     Effort: Pulmonary effort is normal. No respiratory distress.  Breath sounds: Normal breath sounds.  Chest:  Breasts:    Right: No mass, nipple discharge, skin change or tenderness.     Left: No mass, nipple discharge, skin change or tenderness.     Comments: CBE completed and normal.  Abdominal:     General: Bowel sounds are normal.     Palpations: Abdomen is soft.     Tenderness: There is no  abdominal tenderness.  Genitourinary:    Labia:        Right: No tenderness or lesion.        Left: No tenderness or lesion.      Vagina: Normal.     Cervix: Normal.     Uterus: Normal. Not enlarged and not tender.      Comments: NEFG Pink mucosa. No apparent atrophy. Cervix pink. No lesions, cysts, or polyps.  Pap collected with broom and spatula.  BME with no limitations d/t body habitus. No apparent uterine enlargement or tenderness in cul de sac.     Musculoskeletal:     Cervical back: Normal range of motion.  Skin:    General: Skin is warm and dry.  Neurological:     Mental Status: She is alert and oriented to person, place, and time.  Psychiatric:        Mood and Affect: Mood normal.        Behavior: Behavior normal.      Labs and Imaging No results found for this or any previous visit (from the past week). No results found.   Assessment & Plan:  58 year old female Well Woman Exam Pap Smear Breast Exam H/O Rectal Polyps  1. Well woman exam with routine gynecological exam -Exam performed and findings discussed. -Encouraged to activate and utilize Mychart for reviewing of results, communication with office, and scheduling of appts. -Educated on AHA exercise recommendations of 30 minutes of moderate to vigorous activity at least 5x/week.   2. Pap smear for cervical cancer screening -Pap completed -Informed that if normal considering history would only need one more pap in her lifetime! -Educated on ASCCP guidelines regarding pap smear evaluation and frequency. -Informed of turnover time and provider/clinic policy on releasing results.  3. Encounter for screening breast examination -CBE completed and normal. -Educated and encouraged to initiate SBE with increased breast awareness including examination of breast for skin changes, moles, tenderness, etc.  -Order placed for screening mammogram.  4. History of rectal polyps -Discussed previous results and  recommendation for follow up that was not completed. -Referral placed for colonoscopy.     Routine preventative health maintenance measures emphasized. Please refer to After Visit Summary for other counseling recommendations.   No follow-ups on file.      Harlene LITTIE Duncans, CNM 01/05/2024

## 2024-01-09 ENCOUNTER — Ambulatory Visit: Payer: 59 | Admitting: Internal Medicine

## 2024-01-09 ENCOUNTER — Other Ambulatory Visit (HOSPITAL_COMMUNITY): Payer: Self-pay

## 2024-01-09 VITALS — BP 130/59 | HR 60 | Temp 97.4°F | Ht 63.0 in | Wt 242.0 lb

## 2024-01-09 DIAGNOSIS — Z794 Long term (current) use of insulin: Secondary | ICD-10-CM

## 2024-01-09 DIAGNOSIS — Z7984 Long term (current) use of oral hypoglycemic drugs: Secondary | ICD-10-CM | POA: Diagnosis not present

## 2024-01-09 DIAGNOSIS — E118 Type 2 diabetes mellitus with unspecified complications: Secondary | ICD-10-CM | POA: Diagnosis not present

## 2024-01-09 DIAGNOSIS — Z0000 Encounter for general adult medical examination without abnormal findings: Secondary | ICD-10-CM

## 2024-01-09 LAB — POCT GLYCOSYLATED HEMOGLOBIN (HGB A1C): Hemoglobin A1C: 12.5 % — AB (ref 4.0–5.6)

## 2024-01-09 LAB — GLUCOSE, CAPILLARY: Glucose-Capillary: 282 mg/dL — ABNORMAL HIGH (ref 70–99)

## 2024-01-09 MED ORDER — BASAGLAR KWIKPEN 100 UNIT/ML ~~LOC~~ SOPN
50.0000 [IU] | PEN_INJECTOR | Freq: Every day | SUBCUTANEOUS | 3 refills | Status: DC
  Filled 2024-01-09: qty 15, 30d supply, fill #0

## 2024-01-09 NOTE — Progress Notes (Signed)
 CC: diabetes  HPI:  Cindy [name] is a 58 y.o. female living with a history stated below and presents today for a follow up of her diabetes. Please see problem based assessment and plan for additional details.  Past Medical History:  Diagnosis Date   CAD in native artery 07/09/2020   Coronary artery disease    Diabetes mellitus    Generalized pruritus 05/13/2023   Generalized pruritus 05/13/2023   GERD (gastroesophageal reflux disease)    Hyperlipemia    Hypertension    Precordial chest pain    S/P angioplasty with stent 07/09/2020   Shingles 07/04/2023   Stroke [facility])     Current Outpatient Medications on File Prior to Visit  Medication Sig Dispense Refill   atorvastatin  (LIPITOR ) 80 MG tablet Take 1 tablet (80 mg total) by mouth daily. 90 tablet 3   Blood Glucose Monitoring Suppl (TRUE METRIX METER) w/Device KIT Use to test blood sugar 2 (two) times daily. 1 kit 0   clopidogrel  (PLAVIX ) 75 MG tablet Take 1 tablet (75 mg total) by mouth daily. 90 tablet 2   Continuous Glucose Sensor (FREESTYLE LIBRE 3 SENSOR) MISC Place 1 sensor on the skin every 14 days. Use to check glucose continuously 2 each 3   dorzolamide-timolol (COSOPT) 2-0.5 % ophthalmic solution Place 1 drop into both eyes 2 (two) times daily.     ezetimibe  (ZETIA ) 10 MG tablet Take 1 tablet (10 mg total) by mouth daily. 90 tablet 3   glucose blood test strip Use as instructed to test blood sugar 2 times daily 100 each 12   hydrochlorothiazide  (HYDRODIURIL ) 25 MG tablet Take 1/2 tablet (12.5 mg total) by mouth daily. 45 tablet 3   Insulin  Pen Needle (PEN NEEDLES) 32G X 4 MM MISC Use as directed 100 each 0   Insulin  Pen Needle 32G X 4 MM MISC Use up to 2 (two) times daily as directed 100 each 3   isosorbide  mononitrate (IMDUR ) 60 MG 24 hr tablet Take 1 tablet (60 mg total) by mouth daily. 90 tablet 3   ketorolac  (ACULAR ) 0.5 % ophthalmic solution Instill 1 drop into left eye four times a day STARTING 1 DAY  BEFORE SURGERY 5 mL 1   lisinopril  (ZESTRIL ) 40 MG tablet Take 1 tablet (40 mg total) by mouth daily. 90 tablet 3   metFORMIN  (GLUCOPHAGE -XR) 500 MG 24 hr tablet Take 1 tablet (500 mg total) by mouth 2 (two) times daily with a meal. 60 tablet 2   metoprolol  succinate (TOPROL -XL) 25 MG 24 hr tablet Take 1 tablet (25 mg total) by mouth daily. 90 tablet 3   nitroGLYCERIN  (NITROSTAT ) 0.4 MG SL tablet Place 1 tablet (0.4 mg total) under the tongue every 5 (five) minutes as needed for chest pain. 25 tablet 4   ofloxacin  (OCUFLOX ) 0.3 % ophthalmic solution Instill 1 drop into left eye four times a day STARTING 1 DAY BEFORE SURGERY 5 mL 1   pantoprazole  (PROTONIX ) 20 MG tablet Take 1 tablet (20 mg total) by mouth daily. 90 tablet 2   prednisoLONE  acetate (PRED FORTE ) 1 % ophthalmic suspension Place 1 drop into both eyes 4 (four) times daily. 10 mL 1   prednisoLONE  acetate (PRED FORTE ) 1 % ophthalmic suspension Place 1 drop into both eyes 4 (four) times daily. 10 mL 2   sertraline  (ZOLOFT ) 50 MG tablet Take 1 tablet (50 mg total) by mouth at bedtime. 90 tablet 0   TRUEplus Lancets 28G MISC Use to test blood  sugar 2 (two) times daily. 100 each 0   valACYclovir  (VALTREX ) 1000 MG tablet Take 1 tablet (1,000 mg total) by mouth 3 (three) times daily. (Patient not taking: Reported on 01/05/2024) 21 tablet 0   No current facility-administered medications on file prior to visit.    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  Tobacco Use   Smoking status: Never   Smokeless tobacco: Never  Vaping Use   Vaping status: Never Used  Substance and Sexual Activity   Alcohol use: No   Drug use: No   Sexual activity: Not Currently    Birth control/protection: None  Other Topics Concern   Not on file  Social History Narrative   Not on file   Social Drivers of Health   Financial Resource Strain: Not on file  Food Insecurity: Not on file  Transportation Needs: Not on file  Physical Activity: Not on file  Stress: Not on file  Social Connections: Not on file  Intimate Partner Violence: Not on file    Review of Systems: ROS negative except for what is noted on the assessment and plan.  Vitals:   01/09/24 0929  BP: (!) 130/59  Pulse: 60  Temp: (!) 97.4 F (36.3 C)  TempSrc: Oral  SpO2: 100%  Weight: 242 lb (109.8 kg)  Height: 5\' 3"  (1.6 m)    Physical Exam: Constitutional: well-appearing, obese female sitting in chair, in no acute distress Cardiovascular: regular rate and rhythm, no m/r/g Pulmonary/Chest: normal work of breathing on room air Abdominal: soft, non-tender, non-distended Skin: warm and dry Psych: normal mood and behavior  Assessment & Plan:    Patient discussed with Dr. Bettejane Brownie  Type 2 diabetes mellitus with complication, with long-term current use of insulin  [facility]) Patient presents for 49-month follow-up of her type 2 diabetes.  Her last A1c was 11.2% and today it has increased to 12.5%.  The patient takes metformin  500 mg twice daily and Lantus  45 units daily (was increased from 40 units daily at her last office visit).  She is also prescribed Farxiga , but states that her insurance is no longer paying for this and has been out of it for over a month.  The patient also was never to pick up her Trulicity , as she did not know it was prescribed to her.  Additionally, she did not bring in her glucose meter  today, but states that her CBGs are usually 200-260s.  She denies any episodes of hypoglycemia.  Upon review of the patient's insurance, she has a high deductible for prescriptions through her [facility] health plan. Farxiga  and GLP-1's should be covered, but I suspect that the high cost is due to her high deductible.   Plan: -Increase Lantus   to 50 units daily - Continue metformin  500 mg twice daily - Will prescribe a GLP-1, but I have sent a message to [place] to help with patient assistance to see which 1 would be the most affordable/easy to obtain  -Discussed possibility of initiating mealtime insulin  at the next office visit -Will hold off on represcribing Farxiga  until A1c is <10% -Referral to diabetes/nutrition Abe Abed) -Follow-up in a couple weeks with glucose meter  Healthcare maintenance - Due for colonoscopy in March, provided phone number for GI   Jerrion Tabbert, D.O. [facility] Internal Medicine, PGY-3 Phone: [phone] Date 01/09/2024 Time 11:41 AM

## 2024-01-09 NOTE — Assessment & Plan Note (Addendum)
 Patient presents for 68-month follow-up of her type 2 diabetes.  Her last A1c was 11.2% and today it has increased to 12.5%.  The patient takes metformin  500 mg twice daily and Lantus  45 units daily (was increased from 40 units daily at her last office visit).  She is also prescribed Farxiga , but states that her insurance is no longer paying for this and has been out of it for over a month.  The patient also was never to pick up her Trulicity , as she did not know it was prescribed to her.  Additionally, she did not bring in her glucose meter today, but states that her CBGs are usually 200-260s.  She denies any episodes of hypoglycemia.  Upon review of the patient's insurance, she has a high deductible for prescriptions through her [facility] health plan. Farxiga  and GLP-1's should be covered, but I suspect that the high cost is due to her high deductible.   Plan: -Increase Lantus  to 50 units daily - Continue metformin  500 mg twice daily - Will prescribe a GLP-1, but I have sent a message to [place] to help with patient assistance to see which 1 would be the most affordable/easy to obtain  -Discussed possibility of initiating mealtime insulin  at the next office visit -Will hold off on represcribing Farxiga  until A1c is <10% -Referral to diabetes/nutrition Abe Abed) -Follow-up in a couple weeks with glucose meter

## 2024-01-09 NOTE — Patient Instructions (Signed)
 Thank you, Ms.Amea Catana Clinch for allowing us  to provide your care today. Today we discussed:  Diabetes Increase your insulin  to 50 units daily Keep taking metformin  twice a day Come back in ~2-3 weeks  with your glucose meter  I am reaching out to see which once weekly injection would be the most affordable for you - then we will start one of those If we cannot get your diabetes under control this way, we will need to start "meal time insulin " which is another form of insulin  you inject three times a day  Your colonoscopy is due in march: call Dr. Dominic Friendly at  [phone] to schedule this  I have ordered the following labs for you:   Lab Orders         Glucose, capillary         POC Hbg A1C       Referrals ordered today:    Referral Orders         Referral to Nutrition and Diabetes Services      I have ordered the following medication/changed the following medications:   Stop the following medications: There are no discontinued medications.   Start the following medications: No orders of the defined types were placed in this encounter.    Follow up:  2-3 weeks     Remember: please bring your glucose meter to your next appointment  Should you have any questions or concerns please call the internal medicine clinic at [phone].     Carmencita Cusic, D.O. [facility]

## 2024-01-09 NOTE — Assessment & Plan Note (Signed)
-   Due for colonoscopy in March, provided phone number for GI

## 2024-01-12 LAB — CYTOLOGY - PAP
Comment: NEGATIVE
Diagnosis: UNDETERMINED — AB
High risk HPV: NEGATIVE

## 2024-01-12 NOTE — Progress Notes (Signed)
Internal Medicine Clinic Attending  Case discussed with the resident at the time of the visit.  We reviewed the resident's history and exam and pertinent patient test results.  I agree with the assessment, diagnosis, and plan of care documented in the resident's note.

## 2024-01-13 DIAGNOSIS — R87610 Atypical squamous cells of undetermined significance on cytologic smear of cervix (ASC-US): Secondary | ICD-10-CM | POA: Insufficient documentation

## 2024-01-19 ENCOUNTER — Telehealth: Payer: Self-pay | Admitting: *Deleted

## 2024-01-19 NOTE — Telephone Encounter (Signed)
Spoke with patient regarding her mammogram / her insurance [facility] do not cover mammogram. Informed patient  of this and left voice message for Gunnar Fusi at  [facility] to see if patient  would be able to go through the program.

## 2024-01-23 ENCOUNTER — Other Ambulatory Visit (HOSPITAL_COMMUNITY): Payer: Self-pay

## 2024-01-23 ENCOUNTER — Ambulatory Visit: Payer: 59 | Admitting: Internal Medicine

## 2024-01-23 ENCOUNTER — Other Ambulatory Visit: Payer: Self-pay | Admitting: Obstetrics and Gynecology

## 2024-01-23 ENCOUNTER — Ambulatory Visit (INDEPENDENT_AMBULATORY_CARE_PROVIDER_SITE_OTHER): Payer: 59 | Admitting: Dietician

## 2024-01-23 ENCOUNTER — Telehealth: Payer: Self-pay

## 2024-01-23 VITALS — BP 136/65 | HR 74 | Temp 97.8°F | Ht 63.0 in | Wt 244.8 lb

## 2024-01-23 DIAGNOSIS — Z7984 Long term (current) use of oral hypoglycemic drugs: Secondary | ICD-10-CM | POA: Diagnosis not present

## 2024-01-23 DIAGNOSIS — Z794 Long term (current) use of insulin: Secondary | ICD-10-CM

## 2024-01-23 DIAGNOSIS — Z1231 Encounter for screening mammogram for malignant neoplasm of breast: Secondary | ICD-10-CM

## 2024-01-23 DIAGNOSIS — Z0000 Encounter for general adult medical examination without abnormal findings: Secondary | ICD-10-CM

## 2024-01-23 DIAGNOSIS — E118 Type 2 diabetes mellitus with unspecified complications: Secondary | ICD-10-CM | POA: Diagnosis not present

## 2024-01-23 LAB — HM DIABETES EYE EXAM

## 2024-01-23 MED ORDER — MOUNJARO 2.5 MG/0.5ML ~~LOC~~ SOAJ
2.5000 mg | SUBCUTANEOUS | 0 refills | Status: DC
Start: 2024-01-23 — End: 2024-03-19
  Filled 2024-01-23 – 2024-01-25 (×2): qty 2, 28d supply, fill #0

## 2024-01-23 MED ORDER — MOUNJARO 5 MG/0.5ML ~~LOC~~ SOAJ
5.0000 mg | SUBCUTANEOUS | 0 refills | Status: DC
Start: 2024-02-20 — End: 2024-03-19
  Filled 2024-01-23: qty 2, 28d supply, fill #0

## 2024-01-23 NOTE — Progress Notes (Unsigned)
 CC: diabetes  HPI:  [name] is a 58 y.o. female living with a history stated below and presents today for a two week follow up of her uncontrolled type 2 diabetes. She is accompanied by her daughter and granddaughter at this visit. Please see problem based assessment and plan for additional details.  Past Medical History:  Diagnosis Date   CAD in native artery 07/09/2020   Coronary artery disease    Diabetes mellitus    Generalized pruritus 05/13/2023   Generalized pruritus 05/13/2023   GERD (gastroesophageal reflux disease)    Hyperlipemia    Hypertension    Precordial chest pain    S/P angioplasty with stent 07/09/2020   Shingles 07/04/2023   Stroke [facility])     Current Outpatient Medications on File Prior to Visit  Medication Sig Dispense Refill   atorvastatin (LIPITOR) 80 MG tablet Take 1 tablet (80 mg total) by mouth daily. 90 tablet 3   Blood Glucose Monitoring Suppl (TRUE METRIX METER) w/Device KIT Use to test blood sugar 2 (two) times daily. 1 kit 0   clopidogrel (PLAVIX) 75 MG tablet Take 1 tablet (75 mg total) by mouth daily. 90 tablet 2   Continuous Glucose Sensor (FREESTYLE LIBRE 3 SENSOR) MISC Place 1 sensor on the skin every 14 days. Use to check glucose continuously 2 each 3   dorzolamide-timolol (COSOPT) 2-0.5 % ophthalmic solution Place 1 drop into both eyes 2 (two) times daily.     ezetimibe (ZETIA) 10 MG tablet Take 1 tablet (10 mg total) by mouth daily. 90 tablet 3   glucose blood test strip Use as instructed to test blood sugar 2 times daily 100 each 12   hydrochlorothiazide (HYDRODIURIL) 25 MG tablet Take 1/2 tablet (12.5 mg total) by mouth daily. 45 tablet 3   Insulin Glargine (BASAGLAR KWIKPEN) 100 UNIT/ML Inject 50 Units into the skin daily. 15 mL 3   Insulin Pen Needle (PEN NEEDLES) 32G X 4 MM MISC Use as directed 100 each 0   Insulin Pen Needle 32G X 4 MM MISC Use up to 2 (two) times daily as directed 100 each 3   isosorbide mononitrate  (IMDUR) 60 MG 24 hr tablet Take 1 tablet (60 mg total) by mouth daily. 90 tablet 3   lisinopril (ZESTRIL) 40 MG tablet Take 1 tablet (40 mg total) by mouth daily. 90 tablet 3   metFORMIN (GLUCOPHAGE-XR) 500 MG 24 hr tablet Take 1 tablet (500 mg total) by mouth 2 (two) times daily with a meal. 60 tablet 2   metoprolol succinate (TOPROL-XL) 25 MG 24 hr tablet Take 1 tablet (25 mg total) by mouth daily. 90 tablet 3   nitroGLYCERIN (NITROSTAT) 0.4 MG SL tablet Place 1 tablet (0.4 mg total) under the tongue every 5 (five) minutes as needed for chest pain. 25 tablet 4   pantoprazole (PROTONIX) 20 MG tablet Take 1 tablet (20 mg total) by mouth daily. 90 tablet 2   prednisoLONE acetate (PRED FORTE) 1 % ophthalmic suspension Place 1 drop into both eyes 4 (four) times daily. 10 mL 1   prednisoLONE acetate (PRED FORTE) 1 % ophthalmic suspension Place 1 drop into both eyes 4 (four) times daily. 10 mL 2   sertraline (ZOLOFT) 50 MG tablet Take 1 tablet (50 mg total) by mouth at bedtime. 90 tablet 0   TRUEplus Lancets 28G MISC Use to test blood sugar 2 (two) times daily. 100 each 0   valACYclovir (VALTREX) 1000 MG tablet Take 1 tablet (  1,000 mg total) by mouth 3 (three) times daily. (Patient not taking: Reported on 01/05/2024) 21 tablet 0   No current facility-administered medications on file prior to visit.    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  Tobacco Use   Smoking status: Never   Smokeless tobacco: Never  Vaping Use   Vaping status: Never Used  Substance and Sexual Activity   Alcohol use: No   Drug use: No   Sexual activity: Not Currently    Birth control/protection: None  Other Topics Concern   Not on file  Social History Narrative   Not on file   Social Drivers of Health   Financial Resource Strain: Not on file  Food Insecurity: Not on file  Transportation Needs: Not on file  Physical Activity: Not on file  Stress: Not on file  Social Connections: Not on file  Intimate Partner Violence: Not on file    Review of Systems: ROS negative except for what is noted on the assessment and plan.  Vitals:   01/23/24 1405  BP: 136/65  Pulse: 74  Temp: 97.8 F (36.6 C)  TempSrc: Oral  SpO2: 98%  Weight: 244 lb 12.8 oz (111 kg)  Height: 5\' 3"  (1.6 m)    Physical Exam: Constitutional: appears well, no acute distress Cardiovascular: regular rate and rhythm Pulmonary/Chest: normal work of breathing on room air, lungs clear to auscultation bilaterally MSK: normal bulk and tone Skin: warm and dry Psych: normal mood and behavior  Assessment & Plan:   Patient discussed with Dr. Mayford Knife  Type 2 diabetes mellitus with complication, with long-term current use of insulin (HCC) The patient presents for 2-week follow-up of her uncontrolled type 2 diabetes.  Her A1c at the last office visit was 12.5%.  She was advised to increase her Lantus from 45 units daily to 50 units daily, and also advised to continue taking metformin 500 mg twice daily.  Today, she presents with her glucometer, and upon review of the data, her blood sugars are mostly in range.  There are no episodes of hypoglycemia with her higher dose of insulin.  Plan: - Continue Lantus 50 units daily - Continue metformin 500 mg twice daily - Prescribed Mounjaro (likely will need to do prior Auth, if denied, will try to send in Ozempic) - Follow-up in 2  months - Visit with Cindy [name] today -No SGLT2i until A1c <10  Healthcare maintenance - Due for colonoscopy in March, provided phone number for GI - Advised to get shingles vaccine   Romeo Zielinski, D.O. [facility] Internal Medicine, PGY-3 Phone: [phone] Date 01/24/2024 Time 7:21 AM

## 2024-01-23 NOTE — Patient Instructions (Addendum)
 Starting a GLP-1 RA Medication  This is a hormone naturally produced by your intestines. Be sure to rotate injection sites.  Reminder to call for any low blood sugars- or below 80- or if you have symptoms.  Food Portions: Eat smaller portions than usual. Consider using a smaller plate or bowl. Eat more slowly. Stop eating at the first sign of fullness.   Meal Timing: Eat regularly, don't skip meals. Do not rely on hunger as a signal to eat.  Nutrient Quality: Choose and eat healthy foods Eat protein with each meal and snack. Consider eating protein first. Limit high fat,greasy foods. Limit spicy foods.  Decrease Nausea, Vomiting: On injection day, have largest meal before taking the medication. Eat small, frequent low fat meals Drink enough fluids, at leas 64 ounces/day. Best if not carbonated and do not contain caffeine  Constipation: Assure adequate fiber. ( 25-39 grams/day) Eat a variety of vegetables, fruits, whole grains and beans Increase physical activity   Lupita Leash [phone]

## 2024-01-23 NOTE — Telephone Encounter (Signed)
 Telephoned patient at mobile number. Left a voice message with [facility] contact information.

## 2024-01-23 NOTE — Patient Instructions (Signed)
 Thank you, Ms.Dari Ryiah Bellissimo for allowing [place] to provide your care today. Today we discussed:  Colonoscopy: Dr Myrtie Neither- Phone: [phone]  Go to your local pharmacy to get your shingles vaccine  Diabetes Keep taking lantus 50 units at bedtime Keep taking metformin twice a day I am prescribing Mounjaro - a once a week injection for you - we will do a prior authorization if your insurance does not approve this  Follow up in ~2 months  I have ordered the following labs for you:  Lab Orders  No laboratory test(s) ordered today      Referrals ordered today:   Referral Orders  No referral(s) requested today     I have ordered the following medication/changed the following medications:   Stop the following medications: Medications Discontinued During This Encounter  Medication Reason   ketorolac (ACULAR) 0.5 % ophthalmic solution    ofloxacin (OCUFLOX) 0.3 % ophthalmic solution      Start the following medications: Meds ordered this encounter  Medications   tirzepatide (MOUNJARO) 2.5 MG/0.5ML Pen    Sig: Inject 2.5 mg into the skin once a week.    Dispense:  3 mL    Refill:  0   tirzepatide (MOUNJARO) 5 MG/0.5ML Pen    Sig: Inject 5 mg into the skin once a week.    Dispense:  3 mL    Refill:  0     Follow up: 2 months    Should you have any questions or concerns please call the internal medicine clinic at [phone].     Elza Rafter, D.O. [facility]

## 2024-01-23 NOTE — Progress Notes (Signed)
 Lab Results  Component Value Date   HGBA1C 12.5 (A) 01/09/2024   HGBA1C 11.2 (A) 10/10/2023   HGBA1C 11.4 (A) 07/04/2023   HGBA1C >15.5 (H) 02/17/2023   HGBA1C >14.0 06/10/2022    Diabetes Self-Management Education  Visit Type: First/Initial  Appt. Start Time: 1430 Appt. End Time: 1510  01/23/2024  [name], identified by name and date of birth, is a 58 y.o. female with a diagnosis of Diabetes: Type 2.   ASSESSMENT  Patient and daughter would like more meal planning ideas to assist then with weight loss. Tied this in to starting [facility]. Encouraged balanced meals and snacks with at least 5 fruits and vegetables a day.  Diabetes Self-Management Education - 01/23/24 1500       Visit Information   Visit Type First/Initial      Initial Visit   Diabetes Type Type 2    Date Diagnosed 2015    Are you currently following a meal plan? Yes    What type of meal plan do you follow? lower fat by not frying, no soda    Are you taking your medications as prescribed? Yes      Health Coping   How would you rate your overall health? Good      Psychosocial Assessment   Patient Belief/Attitude about Diabetes Motivated to manage diabetes    What is the hardest part about your diabetes right now, causing you the most concern, or is the most worrisome to you about your diabetes?   Making healty food and beverage choices    Self-care barriers Lack of material resources;Unsteady gait/risk for falls    Self-management support Family;Doctor's office;CDE visits    Other persons present Family Member   daughter and Physicist, medical   Patient Concerns Weight Control    Special Needs None    Preferred Learning Style No preference indicated    Learning Readiness Contemplating    How often do you need to have someone help you when you read instructions, pamphlets, or other written materials from your doctor or pharmacy? 2 - Rarely    What is the last grade level you completed in school? 12       Pre-Education Assessment   Patient understands the diabetes disease and treatment process. Comprehends key points    Patient understands incorporating nutritional management into lifestyle. Needs Review    Patient undertands incorporating physical activity into lifestyle. Needs Review    Patient understands using medications safely. Needs Instruction    Patient understands monitoring blood glucose, interpreting and using results Comprehends key points    Patient understands prevention, detection, and treatment of acute complications. Needs Review    Patient understands prevention, detection, and treatment of chronic complications. Compreheands key points    Patient understands how to develop strategies to address psychosocial issues. Comprehends key points    Patient understands how to develop strategies to promote health/change behavior. Comprehends key points      Complications   Last HgB A1C per patient/outside source 12.5 %    How often do you check your blood sugar? 1-2 times/day    Fasting Blood glucose range (mg/dL) 65-784;696-295    Postprandial Blood glucose range (mg/dL) 284-132;440-102    Number of hypoglycemic episodes per month 0    Number of hyperglycemic episodes ( >200mg /dL): Weekly    Can you tell when your blood sugar is high? Yes    What do you do if your blood sugar is high? did not assess  Have you had a dilated eye exam in the past 12 months? Yes    Are you checking your feet? Yes    How many days per week are you checking your feet? 7      Dietary Intake   Breakfast not assessed in detail today    Beverage(s) no regular soda      Activity / Exercise   Activity / Exercise Type Light (walking / raking leaves);ADL's    How many days per week do you exercise? 4    How many minutes per day do you exercise? 15    Total minutes per week of exercise 60      Patient Education   Previous Diabetes Education Yes (please comment)   she states when she was  diagnosed but could not remember where   Healthy Eating Role of diet in the treatment of diabetes and the relationship between the three main macronutrients and blood glucose level;Plate Method;Other (comment);Meal options for control of blood glucose level and chronic complications.;Meal timing in regards to the patients' current diabetes medication.   discussed healthy eating when starting a GLP-1/GIP   Being Active Role of exercise on diabetes management, blood pressure control and cardiac health.    Medications Taught/reviewed insulin/injectables, injection, site rotation, insulin/injectables storage and needle disposal.;Reviewed patients medication for diabetes, action, purpose, timing of dose and side effects.    Acute complications Taught prevention, symptoms, and  treatment of hypoglycemia - the 15 rule.      Individualized Goals (developed by patient)   Medications take my medication as prescribed      Post-Education Assessment   Patient understands incorporating nutritional management into lifestyle. Comprehends key points    Patient undertands incorporating physical activity into lifestyle. Comprehends key points    Patient understands using medications safely. Comphrehends key points    Patient understands prevention, detection, and treatment of acute complications. Comprehends key points      Outcomes   Expected Outcomes Demonstrated interest in learning. Expect positive outcomes    Future DMSE PRN   per patient request   Program Status Completed             Individualized Plan for Diabetes Self-Management Training:   Learning Objective:  Patient will have a greater understanding of diabetes self-management. Patient education plan is to attend individual and/or group sessions per assessed needs and concerns.   Plan:   Patient Instructions  Starting a GLP-1 RA Medication  Keep in the refrigerator most of the time. (Can stay at room temperature for only 21 days)   Rotate injection sites.  Reminder to call for any low blood sugars- or below 80- or if you have symptoms.  Food Portions: Eat smaller portions than usual. Consider using a smaller plate or bowl. Eat more slowly. Stop eating at the first sign of fullness.   Meal Timing: Eat regularly, don't skip meals. Do not rely on hunger as a signal to eat.  Nutrient Quality: Choose and eat healthy foods Eat protein with each meal and snack. Consider eating protein first. Limit high fat,greasy foods. Limit spicy foods.  Decrease Nausea, Vomiting: On injection day, have largest meal before taking the medication. Eat small, frequent low fat meals Drink enough fluids, at leas 64 ounces/day. Best if not carbonated and do not contain caffeine  Constipation: Assure adequate fiber. ( 25-39 grams/day) Eat a variety of vegetables, fruits, whole grains and beans Increase physical activity   Lupita Leash [phone]  Expected Outcomes:  Demonstrated  interest in learning. Expect positive outcomes  Education material provided: Meal plan card, My Plate, and Diabetes Resources  If problems or questions, patient to contact team via:  Phone and Email  Future DSME appointment: PRN (per patient request) Norm Parcel, RD 01/23/2024 3:32 PM.

## 2024-01-24 ENCOUNTER — Telehealth: Payer: Self-pay

## 2024-01-24 NOTE — Telephone Encounter (Signed)
 Prior Authorization for patient Cindy [name] 2.5MG /0.5ML auto-injectors) came through on cover my meds was submitted with last office notes and labs awaiting approval or denial.  [id number]

## 2024-01-24 NOTE — Telephone Encounter (Signed)
 Prior Authorization for patient Cindy [name] 5MG /0.5ML auto-injectors) came through on cover my meds was submitted with last office notes and labs awaiting approval or denial.  KEY:[id number]

## 2024-01-24 NOTE — Assessment & Plan Note (Signed)
 The patient presents for 2-week follow-up of her uncontrolled type 2 diabetes.  Her A1c at the last office visit was 12.5%.  She was advised to increase her Lantus from 45 units daily to 50 units daily, and also advised to continue taking metformin 500 mg twice daily.  Today, she presents with her glucometer, and upon review of the data, her blood sugars are mostly in range.  There are no episodes of hypoglycemia with her higher dose of insulin.  Plan: - Continue Lantus 50 units daily - Continue metformin 500 mg twice daily - Prescribed Mounjaro (likely will need to do prior Auth, if denied, will try to send in Ozempic) - Follow-up in 2 months - Visit with Lupita Leash today -No SGLT2i until A1c <10

## 2024-01-24 NOTE — Telephone Encounter (Signed)
 Cindy [name] ([name]) Rx #: [id number] Need Help? Call us at [phone] Outcome Additional Information Required Your PA has been resolved, no additional PA is required. For further inquiries please contact the number on the back of the member prescription card. (Message [phone]) Drug Mounjaro 2.5MG /0.5ML auto-injectors ePA cloud Optician, dispensing PA Form [phone] NCPDP) Original Claim Info 71

## 2024-01-24 NOTE — Assessment & Plan Note (Signed)
-   Due for colonoscopy in March, provided phone number for GI - Advised to get shingles vaccine

## 2024-01-25 ENCOUNTER — Other Ambulatory Visit (HOSPITAL_COMMUNITY): Payer: Self-pay

## 2024-01-26 ENCOUNTER — Other Ambulatory Visit: Payer: Self-pay | Admitting: Student

## 2024-01-26 DIAGNOSIS — H3500 Unspecified background retinopathy: Secondary | ICD-10-CM

## 2024-01-26 NOTE — Telephone Encounter (Signed)
 Cindy [name] (Key: [id number]) PA Case ID #: [id number] Rx #: [id number] Need Help? Call us at [phone] Outcome Approved today by [facility] NCPDP 2017 Your PA request has been approved. Additional information will be provided in the approval communication. (Message [phone]) Effective Date: 01/25/2024 Authorization Expiration Date: 01/24/2025 Drug Cindy [name] 5MG /0.5ML auto-injectors ePA cloud Psychologist, educational Electronic PA Form [phone] NCPDP) Original Claim Info 75

## 2024-01-28 ENCOUNTER — Telehealth

## 2024-01-30 NOTE — Progress Notes (Signed)
Internal Medicine Clinic Attending  I was physically present during the key portions of the resident provided service and participated in the medical decision making of patient's management care. I reviewed pertinent patient test results.  The assessment, diagnosis, and plan were formulated together and I agree with the documentation in the resident's note.  Williams, Julie Anne, MD  

## 2024-02-02 ENCOUNTER — Ambulatory Visit (INDEPENDENT_AMBULATORY_CARE_PROVIDER_SITE_OTHER): Admitting: Student

## 2024-02-02 ENCOUNTER — Ambulatory Visit (HOSPITAL_COMMUNITY)
Admission: RE | Admit: 2024-02-02 | Discharge: 2024-02-02 | Disposition: A | Discharge status: Home / Self Care | Source: Ambulatory Visit | Attending: Internal Medicine | Admitting: Internal Medicine

## 2024-02-02 ENCOUNTER — Other Ambulatory Visit (HOSPITAL_COMMUNITY): Payer: Self-pay

## 2024-02-02 VITALS — BP 168/82 | HR 75 | Temp 98.2°F | Wt 235.2 lb

## 2024-02-02 DIAGNOSIS — Z6841 Body Mass Index (BMI) 40.0 and over, adult: Secondary | ICD-10-CM

## 2024-02-02 DIAGNOSIS — E66813 Obesity, class 3: Secondary | ICD-10-CM | POA: Diagnosis not present

## 2024-02-02 DIAGNOSIS — M79651 Pain in right thigh: Secondary | ICD-10-CM | POA: Insufficient documentation

## 2024-02-02 MED ORDER — DICLOFENAC SODIUM 1 % EX GEL
4.0000 g | Freq: Four times a day (QID) | CUTANEOUS | 0 refills | Status: AC
  Filled 2024-02-02: qty 300, 30d supply, fill #0

## 2024-02-02 NOTE — Patient Instructions (Addendum)
 Thank you, [name] for allowing [place] to provide your care today. Today we discussed your right thigh pain.  I recommend that you continue taking Tylenol about 400 mg every 6 hours for the next 5 to 7 days.  You can also take Tylenol as needed.  Prescribing you Voltaren gel as we discussed.  I am also getting an x-ray of the right hip to see if there is any evidence of osteoarthritis.  I will call you once the result comes back.  I have ordered the following labs for you:  Lab Orders  No laboratory test(s) ordered today     Tests ordered today:    Referrals ordered today:   Referral Orders  No referral(s) requested today     I have ordered the following medication/changed the following medications:   Stop the following medications: There are no discontinued medications.   Start the following medications: Meds ordered this encounter  Medications   diclofenac Sodium (VOLTAREN) 1 % GEL    Sig: Apply 4 g topically 4 (four) times daily.    Dispense:  350 g    Refill:  0     Follow up:    Remember:   Should you have any questions or concerns please call the internal medicine clinic at [phone].    Kathleen Lime, M.D [facility]

## 2024-02-02 NOTE — Assessment & Plan Note (Signed)
 BMI of 41.66 . Benefits of weight loss discussed with patient. Patient is on Mounjaro 2.5 mg which doesn't really have great benefits of weight loss compared to the higher doses. Hopefully this patient will loose some weight after she starts higher doses. Patient is encouraged to continue lifestyle modifications.

## 2024-02-02 NOTE — Assessment & Plan Note (Addendum)
 Cindy [name] is a 58 year old female with a history of obesity class III, hyperlipidemia, type 2 diabetes who presented to me in the office due to concerns for right thigh pain that has been going on for the past 2 weeks.  Patient reports that the pain woke her up from sleep one morning and has been persistent ever since.  She rates the pain 8 out of 10 and says ibuprofen helps relieve the pain a little bit.  Says walking makes the pain worse.  On exam the right thigh is not swollen. Did not appreciate any warmth or erythema at the site  which makes me ,less concern for infections at this time. Right thigh is without rash. She however reports the pain sometimes radiates to her hip and her lower back and I wonder if this could be  osteoarthritis of the hip.  Could also be nerve entrapment but patient denies any recent trauma  and I did not appreciate any right thigh masses on exam.  Patient has not been immobilized recently and has not been on any long flight, denies any chest pain or shortness of breath, no hemoptysis and no previous history of DVT. WELLS SCORE of 0.I do not think this is DVT.To further assess this, I will get a hip XR and prescribe Voltaren gel for symptomatic control. Patient can also take ibuprofen 400-600 mg q6.Patient counseled to not exceed this dose as she is at risk of bleeding due to her other health conditions.  - Hip XRAY - Voltaren gel as needed - Tylenol as needed - Ibuprofen as needed - Weight bearing as tolerated

## 2024-02-02 NOTE — Progress Notes (Signed)
 CC: Right thigh pain  HPI:  Cindy [name] is a 58 y.o. female living with a history stated below and presents today for thigh pain. Please see problem based assessment and plan for additional details.  Past Medical History:  Diagnosis Date   CAD in native artery 07/09/2020   Coronary artery disease    Diabetes mellitus    Generalized pruritus 05/13/2023   Generalized pruritus 05/13/2023   GERD (gastroesophageal reflux disease)    Hyperlipemia    Hypertension    Precordial chest pain    S/P angioplasty with stent 07/09/2020   Shingles 07/04/2023   Stroke [facility])     Current Outpatient Medications on File Prior to Visit  Medication Sig Dispense Refill   atorvastatin (LIPITOR) 80 MG tablet Take 1 tablet (80 mg total) by mouth daily. 90 tablet 3   Blood Glucose Monitoring Suppl (TRUE METRIX METER) w/Device KIT Use to test blood sugar 2 (two) times daily. 1 kit 0   clopidogrel (PLAVIX) 75 MG tablet Take 1 tablet (75 mg total) by mouth daily. 90 tablet 2   Continuous Glucose Sensor (FREESTYLE LIBRE 3 SENSOR) MISC Place 1 sensor on the skin every 14 days. Use to check glucose continuously 2 each 3   dorzolamide-timolol (COSOPT) 2-0.5 % ophthalmic solution Place 1 drop into both eyes 2 (two) times daily.     ezetimibe (ZETIA) 10 MG tablet Take 1 tablet (10 mg total) by mouth daily. 90 tablet 3   glucose blood test strip Use as instructed to test blood sugar 2 times daily 100 each 12   hydrochlorothiazide (HYDRODIURIL) 25 MG tablet Take 1/2 tablet (12.5 mg total) by mouth daily. 45 tablet 3   Insulin Glargine (BASAGLAR KWIKPEN) 100 UNIT/ML Inject 50 Units into the skin daily. 15 mL 3   Insulin Pen Needle (PEN NEEDLES) 32G X 4 MM MISC Use as directed 100 each 0   Insulin Pen Needle 32G X 4 MM MISC Use up to 2 (two) times daily as directed 100 each 3   isosorbide mononitrate (IMDUR) 60 MG 24 hr tablet Take 1 tablet (60 mg total) by mouth daily. 90 tablet 3   lisinopril (ZESTRIL) 40  MG tablet Take 1 tablet (40 mg total) by mouth daily. 90 tablet 3   metFORMIN (GLUCOPHAGE-XR) 500 MG 24 hr tablet Take 1 tablet (500 mg total) by mouth 2 (two) times daily with a meal. 60 tablet 2   metoprolol succinate (TOPROL-XL) 25 MG 24 hr tablet Take 1 tablet (25 mg total) by mouth daily. 90 tablet 3   nitroGLYCERIN (NITROSTAT) 0.4 MG SL tablet Place 1 tablet (0.4 mg total) under the tongue every 5 (five) minutes as needed for chest pain. 25 tablet 4   pantoprazole (PROTONIX) 20 MG tablet Take 1 tablet (20 mg total) by mouth daily. 90 tablet 2   prednisoLONE acetate (PRED FORTE) 1 % ophthalmic suspension Place 1 drop into both eyes 4 (four) times daily. 10 mL 1   prednisoLONE acetate (PRED FORTE) 1 % ophthalmic suspension Place 1 drop into both eyes 4 (four) times daily. 10 mL 2   sertraline (ZOLOFT) 50 MG tablet Take 1 tablet (50 mg total) by mouth at bedtime. 90 tablet 0   tirzepatide (MOUNJARO) 2.5 MG/0.5ML Pen Inject 2.5 mg into the skin once a week. 2 mL 0   [START ON 02/20/2024] tirzepatide (MOUNJARO) 5 MG/0.5ML Pen Inject 5 mg into the skin once a week. 2 mL 0   TRUEplus Lancets 28G  MISC Use to test blood sugar 2 (two) times daily. 100 each 0   valACYclovir (VALTREX) 1000 MG tablet Take 1 tablet (1,000 mg total) by mouth 3 (three) times daily. (Patient not taking: Reported on 01/05/2024) 21 tablet 0   No current facility-administered medications on file prior to visit.    Family History  Problem Relation Age of Onset   Diabetes Mother    Hypertension Mother    Breast cancer Mother    Stroke Mother    Hypertension Father    Heart failure Father    Breast cancer Maternal Grandmother    Stroke Brother    Colon cancer Neg Hx    Stomach cancer Neg Hx    Rectal cancer Neg Hx    Esophageal cancer Neg Hx    Liver cancer Neg Hx     Social History   Socioeconomic History   Marital status: Single    Spouse name: Not on file   Number of children: 2   Years of education: 12    Highest education level: Not on file  Occupational History   Occupation: [employer]- D[employer]  Tobacco Use   Smoking status: Never   Smokeless tobacco: Never  Vaping Use   Vaping status: Never Used  Substance and Sexual Activity   Alcohol use: No   Drug use: No   Sexual activity: Not Currently    Birth control/protection: None  Other Topics Concern   Not on file  Social History Narrative   Not on file   Social Drivers of Health   Financial Resource Strain: Not on file  Food Insecurity: Not on file  Transportation Needs: Not on file  Physical Activity: Not on file  Stress: Not on file  Social Connections: Not on file  Intimate Partner Violence: Not on file    Review of Systems: ROS negative except for what is noted on the assessment and plan.  Vitals:   02/02/24 0929  BP: (!) 168/82  Pulse: 75  Temp: 98.2 F (36.8 C)  TempSrc: Oral  SpO2: 100%  Weight: 235 lb 3.2 oz (106.7 kg)    Physical Exam: Constitutional: well-appearing man, sitting in wheelchair , in no acute distress Cardiovascular: regular rate and rhythm, no m/r/g Pulmonary/Chest: normal work of breathing on room air, lungs clear to auscultation bilaterally MSK: Right thigh is not swollen, no rash, same size as left thigh,no erythema or warmth. Neurological: alert & oriented x 3, no focal deficit Skin: warm and dry Psych: normal mood and behavior  Assessment & Plan:   Acute pain of right thigh Cindy [name] is a 58 year old female with a history of obesity class III, hyperlipidemia, type 2 diabetes who presented to me in the office due to concerns for right thigh pain that has been going on for the past 2 weeks.  Patient reports that the pain woke her up from sleep one morning and has been persistent ever since.  She rates the pain 8 out of 10 and says ibuprofen helps relieve the pain a little bit.  Says walking makes the pain worse.  On exam the right thigh is not swollen. Did not appreciate any warmth or  erythema at the site  which makes me ,less concern for infections at this time. Right thigh is without rash. She however reports the pain sometimes radiates to her hip and her lower back and I wonder if this could be  osteoarthritis of the hip.  Could also be nerve entrapment but patient  denies any recent trauma  and I did not appreciate any right thigh masses on exam.  Patient has not been immobilized recently and has not been on any long flight, denies any chest pain or shortness of breath, no hemoptysis and no previous history of DVT. WELLS SCORE of 0.I do not think this is DVT.To further assess this, I will get a hip XR and prescribe Voltaren gel for symptomatic control. Patient can also take ibuprofen 400-600 mg q6.Patient counseled to not exceed this dose as she is at risk of bleeding due to her other health conditions.  - Hip XRAY - Voltaren gel as needed - Tylenol as needed - Ibuprofen as needed - Weight bearing as tolerated   Obesity, Class III, BMI 40-49.9 (morbid obesity) (HCC) BMI of 41.66 . Benefits of weight loss discussed with patient. Patient is on Mounjaro 2.5 mg which doesn't really have great benefits of weight loss compared to the higher doses. Hopefully this patient will loose some weight after she starts higher doses. Patient is encouraged to continue lifestyle modifications.     Patient discussed with Dr. Rip Harbour, M.D [facility] Internal [facility] Phone: [phone] Date 02/02/2024 Time 1:10 PM

## 2024-02-03 ENCOUNTER — Other Ambulatory Visit: Payer: Self-pay | Admitting: Nurse Practitioner

## 2024-02-03 NOTE — Progress Notes (Signed)
 Internal Medicine Clinic Attending  Case discussed with the resident at the time of the visit.  We reviewed the resident's history and exam and pertinent patient test results.  I agree with the assessment, diagnosis, and plan of care documented in the resident's note.

## 2024-02-04 ENCOUNTER — Other Ambulatory Visit: Payer: Self-pay | Admitting: Nurse Practitioner

## 2024-02-04 DIAGNOSIS — E7800 Pure hypercholesterolemia, unspecified: Secondary | ICD-10-CM

## 2024-02-04 DIAGNOSIS — I2510 Atherosclerotic heart disease of native coronary artery without angina pectoris: Secondary | ICD-10-CM

## 2024-02-17 ENCOUNTER — Encounter: Payer: Self-pay | Admitting: Gastroenterology

## 2024-03-08 ENCOUNTER — Other Ambulatory Visit (HOSPITAL_COMMUNITY): Payer: Self-pay

## 2024-03-15 ENCOUNTER — Ambulatory Visit: Payer: 59 | Admitting: Hematology and Oncology

## 2024-03-15 ENCOUNTER — Ambulatory Visit
Admission: RE | Admit: 2024-03-15 | Discharge: 2024-03-15 | Disposition: A | Discharge status: Home / Self Care | Payer: 59 | Source: Ambulatory Visit | Attending: Obstetrics and Gynecology | Admitting: Obstetrics and Gynecology

## 2024-03-15 VITALS — BP 149/91 | Wt 226.0 lb

## 2024-03-15 DIAGNOSIS — Z1231 Encounter for screening mammogram for malignant neoplasm of breast: Secondary | ICD-10-CM

## 2024-03-15 NOTE — Progress Notes (Signed)
 [name] is [name] 58 y.o. female who presents to [facility] clinic today with no complaints.    Pap Smear: Pap not smear completed today. Last Pap smear was 01/05/24 and was abnormal/ ASCUS/ HPV-. Per patient has no history of an abnormal Pap smear. Last Pap smear result is available in Epic.   Physical exam: Breasts Breasts symmetrical. No skin abnormalities bilateral breasts. No nipple retraction bilateral breasts. No nipple discharge bilateral breasts. No lymphadenopathy. No lumps palpated bilateral breasts.       Pelvic/Bimanual Pap is not indicated today    Smoking History: Patient has never smoked and was not referred to quit line.    Patient Navigation: Patient education provided. Access to services provided for patient through [facility] program. No interpreter provided. No transportation provided   Colorectal Cancer Screening: Per patient has never had colonoscopy completed No complaints today.    Breast and Cervical Cancer Risk Assessment: Patient does not have family history of breast cancer, known genetic mutations, or radiation treatment to the chest before age 19. Patient does not have history of cervical dysplasia, immunocompromised, or DES exposure in-utero.  Risk Scores as of Encounter on 03/15/2024     Cindy [name]           5-year 2.29%   Lifetime 11.11%            Last calculated by Silas, Ansyi K, CMA on 03/15/2024 at  1:21 PM          [name]: [facility] exam without pap smear No complaints with benign exam.   P: Referred patient to the [facility] for [name] screening mammogram. Appointment scheduled 03/15/2024.  Cindy Bone A, NP 03/15/2024 1:21 PM

## 2024-03-15 NOTE — Patient Instructions (Signed)
 Taught Cindy [name] about self breast awareness and gave educational materials to take home. Patient did not need a Pap smear today due to last Pap smear was in 01/05/2024 per patient. Told patient about free cervical cancer screenings to receive a Pap smear if would like one next year. Let her know [facility] will cover Pap smears every 5 years unless has a history of abnormal Pap smears. Referred patient to the [facility] for screening mammogram. Appointment scheduled for 03/15/2024. Patient aware of appointment and will be there. Let patient know will follow up with her within the next couple weeks with results. Cindy [name] verbalized understanding.  Adelaide Adjutant, NP 1:36 PM

## 2024-03-19 ENCOUNTER — Other Ambulatory Visit: Payer: Self-pay

## 2024-03-19 ENCOUNTER — Other Ambulatory Visit: Payer: Self-pay | Admitting: Internal Medicine

## 2024-03-19 ENCOUNTER — Ambulatory Visit (INDEPENDENT_AMBULATORY_CARE_PROVIDER_SITE_OTHER): Payer: 59 | Admitting: Student

## 2024-03-19 ENCOUNTER — Other Ambulatory Visit (HOSPITAL_COMMUNITY): Payer: Self-pay

## 2024-03-19 ENCOUNTER — Other Ambulatory Visit: Payer: Self-pay | Admitting: Nurse Practitioner

## 2024-03-19 VITALS — BP 155/68 | HR 82 | Temp 97.6°F | Ht 63.0 in | Wt 229.0 lb

## 2024-03-19 DIAGNOSIS — E785 Hyperlipidemia, unspecified: Secondary | ICD-10-CM | POA: Diagnosis not present

## 2024-03-19 DIAGNOSIS — E66813 Obesity, class 3: Secondary | ICD-10-CM | POA: Diagnosis not present

## 2024-03-19 DIAGNOSIS — Z7985 Long-term (current) use of injectable non-insulin antidiabetic drugs: Secondary | ICD-10-CM

## 2024-03-19 DIAGNOSIS — Z7984 Long term (current) use of oral hypoglycemic drugs: Secondary | ICD-10-CM

## 2024-03-19 DIAGNOSIS — I10 Essential (primary) hypertension: Secondary | ICD-10-CM

## 2024-03-19 DIAGNOSIS — E118 Type 2 diabetes mellitus with unspecified complications: Secondary | ICD-10-CM

## 2024-03-19 DIAGNOSIS — Z794 Long term (current) use of insulin: Secondary | ICD-10-CM

## 2024-03-19 DIAGNOSIS — Z6841 Body Mass Index (BMI) 40.0 and over, adult: Secondary | ICD-10-CM

## 2024-03-19 MED ORDER — LISINOPRIL 40 MG PO TABS
40.0000 mg | ORAL_TABLET | Freq: Every day | ORAL | 3 refills | Status: DC
  Filled 2024-03-19: qty 90, 90d supply, fill #0

## 2024-03-19 MED ORDER — WEGOVY 0.25 MG/0.5ML ~~LOC~~ SOAJ
0.2500 mg | SUBCUTANEOUS | 1 refills | Status: DC
  Filled 2024-03-19: qty 2, 28d supply, fill #0

## 2024-03-19 MED ORDER — METFORMIN HCL ER 500 MG PO TB24
500.0000 mg | ORAL_TABLET | Freq: Two times a day (BID) | ORAL | 2 refills | Status: DC
  Filled 2024-03-19: qty 60, 30d supply, fill #0
  Filled 2024-04-19: qty 60, 30d supply, fill #1

## 2024-03-19 MED ORDER — METOPROLOL SUCCINATE ER 25 MG PO TB24
25.0000 mg | ORAL_TABLET | Freq: Every day | ORAL | 1 refills | Status: DC
  Filled 2024-03-19: qty 90, 90d supply, fill #0
  Filled 2024-06-11: qty 90, 90d supply, fill #1

## 2024-03-19 NOTE — Assessment & Plan Note (Addendum)
 Patient presents with a history of T2DM with a prior A1c of 12.5  in February.  They are on a regimen of Lantus  50 units, Metformin  500 mg BID.  Patient denies hypoglycemia.  She checks her blood glucose twice a day, but did not bring her glucometer with her.   Plan: -Continue regimen of Lantus  50 units daily. Per dispense history patient is not complaint with metformin , reports taking it twice a day and still has some. Will refill metformin   -Ordered Wegovy  for both weight loss and T2DM since Mounjaro  is not covered by insurance  -A1c due in one month  -Urine ACR ordered

## 2024-03-19 NOTE — Progress Notes (Signed)
 Established Patient Office Visit  Subjective   Patient ID: Cindy [name], female    DOB: February 25, 1966  Age: 58 y.o. MRN: [id number]  Chief Complaint  Patient presents with   Medical Management of Chronic Issues    Cindy [name] is a 58 y.o. who presents to the clinic for a follow up of T2DM. Please see problem based assessment and plan for additional details.  Patient Active Problem List   Diagnosis Date Noted   Acute pain of right thigh 02/02/2024   ASCUS of cervix with negative high risk HPV 01/13/2024   Healthcare maintenance 03/02/2023   Dependent edema 03/02/2023   Anginal chest pain at rest [facility]) 02/17/2023   CAD in native artery 07/09/2020   S/P angioplasty with stent 07/08/20 DES to dLAD and DES to mLAD  07/09/2020   Unstable angina (HCC) 07/06/2020   History of CVA (cerebrovascular accident) 08/19/2018   Obesity, Class III, BMI 40-49.9 (morbid obesity) (HCC) 07/01/2010   Type 2 diabetes mellitus with complication, with long-term current use of insulin  (HCC) 05/08/2008   HLD (hyperlipidemia) 05/08/2008   Benign essential HTN 05/08/2008      Objective:     BP (!) 155/68 (BP Location: Right Arm, Patient Position: Sitting, Cuff Size: Small)   Pulse 82   Temp 97.6 F (36.4 C) (Oral)   Ht 5\' 3"  (1.6 m)   Wt 229 lb (103.9 kg)   LMP 08/11/2016   SpO2 100%   BMI 40.57 kg/m  BP Readings from Last 3 Encounters:  03/19/24 (!) 155/68  03/15/24 (!) 149/91  02/02/24 (!) 168/82   Wt Readings from Last 3 Encounters:  03/19/24 229 lb (103.9 kg)  03/15/24 226 lb (102.5 kg)  02/02/24 235 lb 3.2 oz (106.7 kg)      Physical Exam Vitals reviewed.  Constitutional:      General: She is not in acute distress.    Appearance: She is obese. She is not ill-appearing or toxic-appearing.  Cardiovascular:     Rate and Rhythm: Normal rate and regular rhythm.     Pulses:          Dorsalis pedis pulses are 2+ on the right side and 2+ on the left side.       Posterior  tibial pulses are 2+ on the right side and 2+ on the left side.     Heart sounds: Normal heart sounds.  Pulmonary:     Effort: Pulmonary effort is normal. No respiratory distress.     Breath sounds: Normal breath sounds. No wheezing or rales.  Abdominal:     Palpations: Abdomen is soft.     Tenderness: There is no abdominal tenderness.  Musculoskeletal:     Right lower leg: No edema.     Left lower leg: No edema.  Skin:    General: Skin is warm and dry.  Neurological:     Mental Status: She is alert.  Psychiatric:        Mood and Affect: Mood and affect normal.      No results found for any visits on 03/19/24.  Last metabolic panel Lab Results  Component Value Date   GLUCOSE 299 (H) 07/01/2023   NA 137 07/01/2023   K 3.8 07/01/2023   CL 99 07/01/2023   CO2 27 07/01/2023   BUN 21 (H) 07/01/2023   CREATININE 0.79 07/01/2023   GFRNONAA >60 07/01/2023   CALCIUM  9.2 07/01/2023   PROT 7.6 05/08/2023   ALBUMIN 3.1 (L) 05/08/2023  BILITOT 0.5 05/08/2023   ALKPHOS 85 05/08/2023   AST 15 05/08/2023   ALT 13 05/08/2023   ANIONGAP 11 07/01/2023   Last lipids Lab Results  Component Value Date   CHOL 129 12/28/2022   HDL 43 12/28/2022   LDLCALC 65 12/28/2022   TRIG 113 12/28/2022   CHOLHDL 3.0 12/28/2022   Last hemoglobin A1c Lab Results  Component Value Date   HGBA1C 12.5 (A) 01/09/2024     Assessment & Plan:   Problem List Items Addressed This Visit       Cardiovascular and Mediastinum   Benign essential HTN   Patient presents with a history of hypertension with a blood pressure today of 170/90, recheck at 155/68. Their hypertension is uncontrolled on a regimen of Imdur  15mg  daily. She was previously on Lisinopril  40mg , Toprol  XL 25mg  daily, hydrochlorothiazide  12.5mg  daily; however, patient and her daughter report that her insurance was not paying for those medications. Per patient, the insurance will pay for the mediations starting today. .  Prior BMP in August  2024, Scr was 0.79. She denies new sx of chest pain, shortness of breath, or headache.  Plan: -Continue current regimen of Imdur  15mg  daily, will restarted lisinopril  40 mg and Toprol  XL 25 mg daily. Patient also instructed to contact the clinic if she is unable to get these medications by Friday so we can start a new regimen.  -If she remains hypertensive in one month, will restart hydrochlorothiazide  then. -BMP today       Relevant Medications   lisinopril  (ZESTRIL ) 40 MG tablet   metoprolol  succinate (TOPROL -XL) 25 MG 24 hr tablet   Other Relevant Orders   Basic metabolic panel with GFR     Endocrine   Type 2 diabetes mellitus with complication, with long-term current use of insulin  (HCC) - Primary   Patient presents with a history of T2DM with a prior A1c of 12.5  in February.  They are on a regimen of Lantus  50 units, Metformin  500 mg BID.  Patient denies hypoglycemia.  She checks her blood glucose twice a day, but did not bring her glucometer with her.   Plan: -Continue regimen of Lantus  50 units daily. Per dispense history patient is not complaint with metformin , reports taking it twice a day and still has some. Will refill metformin   -Ordered Wegovy  for both weight loss and T2DM since Mounjaro  is not covered by insurance  -A1c due in one month  -Urine ACR ordered        Relevant Medications   metFORMIN  (GLUCOPHAGE -XR) 500 MG 24 hr tablet   lisinopril  (ZESTRIL ) 40 MG tablet   Other Relevant Orders   Microalbumin / Creatinine Urine Ratio   Basic metabolic panel with GFR     Other   HLD (hyperlipidemia)   Relevant Medications   lisinopril  (ZESTRIL ) 40 MG tablet   metoprolol  succinate (TOPROL -XL) 25 MG 24 hr tablet   Obesity, Class III, BMI 40-49.9 (morbid obesity) (HCC)   Patient presents with a BMI of 40.57.  Patient has not tried diet changes but has been exercising for 15 minutes daily.  Patient is committed to weight loss by continuing exercising as she can tolerate   and by making diet changes including decreasing fast food, carbohydrates and increasing fruits/vegetables. Patient denies family and personal history of medullary thyroid cancer and also denies family and personal history of MEN type II. Patient is currently not pregnant nor lactating. Plan: -Continue exercising daily and will start diet changes listed above  -  Start Ozempic  2mL/28 days, 0.25mg  once a week  -Patient was previously referred Abe Abed and was asked to see her again for nutrition/diet changes      Relevant Medications   metFORMIN  (GLUCOPHAGE -XR) 500 MG 24 hr tablet   Semaglutide -Weight Management (WEGOVY ) 0.25 MG/0.5ML SOAJ    Return in about 4 weeks (around 04/16/2024) for A1c, HTN.    Aurora Lees, DO

## 2024-03-19 NOTE — Assessment & Plan Note (Signed)
 Patient presents with a BMI of 40.57.  Patient has not tried diet changes but has been exercising for 15 minutes daily.  Patient is committed to weight loss by continuing exercising as she can tolerate  and by making diet changes including decreasing fast food, carbohydrates and increasing fruits/vegetables. Patient denies family and personal history of medullary thyroid cancer and also denies family and personal history of MEN type II. Patient is currently not pregnant nor lactating. Plan: -Continue exercising daily and will start diet changes listed above  -Start Ozempic  76mL/28 days, 0.25mg  once a week  -Patient was previously referred Abe Abed and was asked to see her again for nutrition/diet changes

## 2024-03-19 NOTE — Assessment & Plan Note (Signed)
 Patient presents with a history of hypertension with a blood pressure today of 170/90, recheck at 155/68. Their hypertension is uncontrolled on a regimen of Imdur  15mg  daily. She was previously on Lisinopril  40mg , Toprol  XL 25mg  daily, hydrochlorothiazide  12.5mg  daily; however, patient and her daughter report that her insurance was not paying for those medications. Per patient, the insurance will pay for the mediations starting today. .  Prior BMP in August 2024, Scr was 0.79. She denies new sx of chest pain, shortness of breath, or headache.  Plan: -Continue current regimen of Imdur  15mg  daily, will restarted lisinopril  40 mg and Toprol  XL 25 mg daily. Patient also instructed to contact the clinic if she is unable to get these medications by Friday so we can start a new regimen.  -If she remains hypertensive in one month, will restart hydrochlorothiazide  then. -BMP today

## 2024-03-19 NOTE — Patient Instructions (Addendum)
 Thank you, [name] for allowing us  to provide your care today. Today we discussed diabetes, weight loss, and high blood pressure.    If you are unable to pick up your blood pressure medications by Friday, please call the clinic so we can order different medications if needed.   I have ordered the following labs for you:   Lab Orders         Microalbumin / Creatinine Urine Ratio         Basic metabolic panel with GFR        I have ordered the following medication/changed the following medications:   Stop the following medications: Medications Discontinued During This Encounter  Medication Reason   lisinopril  (ZESTRIL ) 40 MG tablet Reorder   metFORMIN  (GLUCOPHAGE -XR) 500 MG 24 hr tablet Reorder   metoprolol  succinate (TOPROL -XL) 25 MG 24 hr tablet Reorder   tirzepatide  (MOUNJARO ) 2.5 MG/0.5ML Pen    tirzepatide  (MOUNJARO ) 5 MG/0.5ML Pen      Start the following medications: Meds ordered this encounter  Medications   metFORMIN  (GLUCOPHAGE -XR) 500 MG 24 hr tablet    Sig: Take 1 tablet (500 mg total) by mouth 2 (two) times daily with a meal.    Dispense:  60 tablet    Refill:  2    Please see if patient qualifies for $5 list for 30-day supply. Her insurance is not currently active. She is an [facility] patient. 03/02/23 IM program per chat with MD KKH   lisinopril  (ZESTRIL ) 40 MG tablet    Sig: Take 1 tablet (40 mg total) by mouth daily.    Dispense:  90 tablet    Refill:  3   metoprolol  succinate (TOPROL -XL) 25 MG 24 hr tablet    Sig: Take 1 tablet (25 mg total) by mouth daily.    Dispense:  90 tablet    Refill:  1   Semaglutide -Weight Management (WEGOVY ) 0.25 MG/0.5ML SOAJ    Sig: Inject 0.25 mg into the skin once a week.    Dispense:  2 mL    Refill:  1     Follow up: 1 month for A1c      Should you have any questions or concerns please call the internal medicine clinic at [phone].     Please note that our late policy has changed.  If you are more than  15 minutes late to your appointment, you may be asked to reschedule your appointment.  Dr. Sharlon Deacon, D.O. [facility]

## 2024-03-20 ENCOUNTER — Other Ambulatory Visit (HOSPITAL_COMMUNITY): Payer: Self-pay

## 2024-03-20 ENCOUNTER — Telehealth: Payer: Self-pay

## 2024-03-20 ENCOUNTER — Other Ambulatory Visit: Payer: Self-pay | Admitting: Student

## 2024-03-20 LAB — BASIC METABOLIC PANEL WITH GFR
BUN/Creatinine Ratio: 17 (ref 9–23)
BUN: 12 mg/dL (ref 6–24)
CO2: 25 mmol/L (ref 20–29)
Calcium: 9 mg/dL (ref 8.7–10.2)
Chloride: 101 mmol/L (ref 96–106)
Creatinine, Ser: 0.7 mg/dL (ref 0.57–1.00)
Glucose: 436 mg/dL — ABNORMAL HIGH (ref 70–99)
Potassium: 4.8 mmol/L (ref 3.5–5.2)
Sodium: 139 mmol/L (ref 134–144)
eGFR: 100 mL/min/{1.73_m2} (ref >59)

## 2024-03-20 LAB — MICROALBUMIN / CREATININE URINE RATIO
Creatinine, Urine: 58.8 mg/dL
Microalb/Creat Ratio: 416 mg/g{creat} — ABNORMAL HIGH (ref 0–29)
Microalbumin, Urine: 244.4 ug/mL

## 2024-03-20 MED ORDER — SEMAGLUTIDE(0.25 OR 0.5MG/DOS) 2 MG/3ML ~~LOC~~ SOPN
0.2500 mg | PEN_INJECTOR | SUBCUTANEOUS | 3 refills | Status: DC
Start: 2024-03-20 — End: 2024-05-21
  Filled 2024-03-20 – 2024-05-21 (×3): qty 3, 56d supply, fill #0

## 2024-03-20 NOTE — Telephone Encounter (Signed)
 Prior Authorization for patient (Wegovy  0.25MG /0.5ML auto-injectors) came through on cover my meds was submitted with last office notes and labs awaiting approval or denial.  [id number]

## 2024-03-20 NOTE — Progress Notes (Signed)
 Changed patient's Wegovy  to Ozempic  for T2DM. Spoke to her in regards to her elevated microalbuminuria. She was able to fill the lisinopril  prescription today.  Plan: -Will start with lisinopril  40mg  for microalbuminuria, if A1c is less than 10 at follow up appointment in one month, will start jardiance 10mg   -Renal function is preserved at this moment, will hold off on Nephrology referral and begin treatment for albuminuria   Discussed plan with Dr. Adriane Albe.

## 2024-03-20 NOTE — Progress Notes (Signed)
 Internal Medicine Clinic Attending  Case discussed with the resident at the time of the visit.  We reviewed the resident's history and exam and pertinent patient test results.  I agree with the assessment, diagnosis, and plan of care documented in the resident's note.

## 2024-03-21 ENCOUNTER — Telehealth: Payer: Self-pay

## 2024-03-21 NOTE — Telephone Encounter (Signed)
 Cindy [name] [address] [place], [place] [address] ID: [id number] DOB: Dec 31, 1965 Case no.: [id number] Prescriber Name: Cindy [name] Prescriber Phone: [phone] Prescriber Fax: [phone] Initial adverse determination Dear Courtland Ditch Woo: On 03/20/2024, we received a formulary exception request from your prescriber for coverage of Wegovy  (semaglutide ) for you. This letter is to notify you that we have denied this formulary exception request because: You do not meet the plan's medical necessity criteria for non-formulary drugs. Your records with us  and the information submitted by your doctor do not show you meet the following criteria: (1) do not have history of type 1 or type 2 diabetes (high blood sugar levels) (2) have a hemoglobin A1c (a blood test that is an indicator of blood sugar levels over the past 3 months) of less than 6.5 percent from the last 3 months (3) will not be used together with other semaglutide -containing products or any other (glucagon-like peptide-1) GLP-1 receptor agonists (4) the prescribed maintenance dose will be 2.4 milligrams (mg) injected under the skin once weekly Please note: Your records with us  show a prior authorization request for Mounjaro  was approved on 01/26/2024 that is active through 01/25/2025. In addition, a prior authorization request for Trulicity  was approved on 10/16/2023 that is active through 10/15/2024    The additional information requested has been placed in the box to be faxed.

## 2024-03-21 NOTE — Telephone Encounter (Signed)
 Prior Authorization for patient (Ozempic  (0.25 or 0.5 MG/DOSE) 2MG /3ML pen-injectors) came through on cover my meds was submitted with last office notes and labs awaiting approval or denial.  KEY:[id number]

## 2024-03-25 ENCOUNTER — Other Ambulatory Visit: Payer: Self-pay | Admitting: Nurse Practitioner

## 2024-03-26 NOTE — Telephone Encounter (Signed)
 Approval Dear Cindy [name]: On 03/21/2024, we received a request from your prescriber for coverage of Ozempic  (semaglutide ) for you. We reviewed the request for medical necessity and have determined it meets established medical criteria. This letter confirms our approval for the requested drug. Your authorization number is: [facility] [phone] [facility] As long as you remain covered by your prescription drug plan and there are no changes to your plan benefits, this request is approved for the following time period: 03/25/2024 - 03/25/2025

## 2024-03-28 ENCOUNTER — Other Ambulatory Visit: Payer: Self-pay

## 2024-03-28 MED ORDER — HYDROCHLOROTHIAZIDE 25 MG PO TABS: 12.5000 mg | ORAL_TABLET | Freq: Every day | ORAL | 0 refills | Status: DC

## 2024-03-30 ENCOUNTER — Other Ambulatory Visit: Payer: Self-pay | Admitting: Nurse Practitioner

## 2024-03-30 ENCOUNTER — Other Ambulatory Visit: Payer: Self-pay | Admitting: Internal Medicine

## 2024-03-30 DIAGNOSIS — E785 Hyperlipidemia, unspecified: Secondary | ICD-10-CM

## 2024-04-17 ENCOUNTER — Ambulatory Visit: Admitting: Gastroenterology

## 2024-04-19 ENCOUNTER — Ambulatory Visit: Admitting: Student

## 2024-04-19 ENCOUNTER — Other Ambulatory Visit (HOSPITAL_COMMUNITY): Payer: Self-pay

## 2024-04-19 VITALS — BP 138/68 | HR 73 | Temp 97.6°F | Ht 63.0 in | Wt 225.8 lb

## 2024-04-19 DIAGNOSIS — E66813 Obesity, class 3: Secondary | ICD-10-CM | POA: Diagnosis not present

## 2024-04-19 DIAGNOSIS — Z7984 Long term (current) use of oral hypoglycemic drugs: Secondary | ICD-10-CM

## 2024-04-19 DIAGNOSIS — E118 Type 2 diabetes mellitus with unspecified complications: Secondary | ICD-10-CM

## 2024-04-19 DIAGNOSIS — I10 Essential (primary) hypertension: Secondary | ICD-10-CM

## 2024-04-19 DIAGNOSIS — Z794 Long term (current) use of insulin: Secondary | ICD-10-CM | POA: Diagnosis not present

## 2024-04-19 DIAGNOSIS — Z6841 Body Mass Index (BMI) 40.0 and over, adult: Secondary | ICD-10-CM

## 2024-04-19 LAB — POCT GLYCOSYLATED HEMOGLOBIN (HGB A1C): Hemoglobin A1C: 13.3 % — AB (ref 4.0–5.6)

## 2024-04-19 LAB — GLUCOSE, CAPILLARY: Glucose-Capillary: 356 mg/dL — ABNORMAL HIGH (ref 70–99)

## 2024-04-19 MED ORDER — METFORMIN HCL ER 500 MG PO TB24
1000.0000 mg | ORAL_TABLET | Freq: Two times a day (BID) | ORAL | 2 refills | Status: AC
  Filled 2024-04-19: qty 120, 30d supply, fill #0
  Filled 2024-07-03: qty 120, 30d supply, fill #1
  Filled 2024-09-27: qty 120, 30d supply, fill #2

## 2024-04-19 MED ORDER — BASAGLAR KWIKPEN 100 UNIT/ML ~~LOC~~ SOPN
28.0000 [IU] | PEN_INJECTOR | Freq: Two times a day (BID) | SUBCUTANEOUS | 4 refills | Status: DC
  Filled 2024-04-19: qty 15, 27d supply, fill #0
  Filled 2024-05-22: qty 15, 27d supply, fill #1

## 2024-04-19 NOTE — Progress Notes (Signed)
 CC: 1 month follow-up for A1c.  HPI:  [name] is a 58 y.o. female living with a history stated below and presents today for follow-up of her A1c. Please see problem based assessment and plan for additional details.   Past Medical History:  Diagnosis Date   CAD in native artery 07/09/2020   Coronary artery disease    Diabetes mellitus    Generalized pruritus 05/13/2023   Generalized pruritus 05/13/2023   GERD (gastroesophageal reflux disease)    Hyperlipemia    Hypertension    Precordial chest pain    S/P angioplasty with stent 07/09/2020   Shingles 07/04/2023   Stroke [facility])     Current Outpatient Medications on File Prior to Visit  Medication Sig Dispense Refill   atorvastatin  (LIPITOR ) 80 MG tablet Take 1 tablet (80 mg total) by mouth daily. 90 tablet 3   Blood Glucose Monitoring Suppl (TRUE METRIX METER) w/Device KIT Use to test blood sugar 2 (two) times daily. 1 kit 0   clopidogrel  (PLAVIX ) 75 MG tablet Take 1 tablet (75 mg total) by mouth daily. 90 tablet 2   Continuous Glucose Sensor (FREESTYLE LIBRE 3 SENSOR) MISC Place 1 sensor on the skin every 14 days. Use to check glucose continuously 2 each 3   diclofenac  Sodium (VOLTAREN ) 1 % GEL Apply 4 grams topically 4 (four) times daily. 400 g 0   dorzolamide-timolol (COSOPT) 2-0.5 % ophthalmic solution Place 1 drop into both eyes 2 (two) times daily.     ezetimibe  (ZETIA ) 10 MG tablet Take 1 tablet by mouth once daily 90 tablet 1   glucose blood test strip Use as instructed to test blood sugar 2 times daily 100 each 12   hydrochlorothiazide  (HYDRODIURIL ) 25 MG tablet Take 1/2 tablet (12.5 mg total) by mouth daily. 45 tablet 0   Insulin  Pen Needle (PEN NEEDLES) 32G X 4 MM MISC Use as directed 100 each 0   Insulin  Pen Needle 32G X 4 MM MISC Use up to 2 (two) times daily as directed 100 each 3   isosorbide  mononitrate (IMDUR ) 30 MG 24 hr tablet Take 0.5 tablets (15 mg total) by mouth daily. 45 tablet 1   lisinopril   (ZESTRIL ) 40 MG tablet Take 1 tablet by mouth once daily 90 tablet 0   metoprolol  succinate (TOPROL -XL) 25 MG 24 hr tablet Take 1 tablet (25 mg total) by mouth daily. 90 tablet 1   nitroGLYCERIN  (NITROSTAT ) 0.4 MG SL tablet Place 1 tablet (0.4 mg total) under the tongue every 5 (five) minutes as needed for chest pain. 25 tablet 4   pantoprazole  (PROTONIX ) 20 MG tablet Take 1 tablet (20 mg total) by mouth daily. 90 tablet 2   Semaglutide ,0.25 or 0.5MG /DOS, 2 MG/3ML SOPN Inject 0.25 mg into the skin once a week. 3 mL 3   sertraline  (ZOLOFT ) 50 MG tablet Take 1 tablet (50 mg total) by mouth at bedtime. 90 tablet 0   TRUEplus Lancets 28G MISC Use to test blood sugar 2 (two) times daily. 100 each 0   valACYclovir  (VALTREX ) 1000 MG tablet Take 1 tablet (1,000 mg total) by mouth 3 (three) times daily. 21 tablet 0   No current facility-administered medications on file prior to visit.     Review of Systems: ROS negative except for what is noted on the assessment and plan.  Vitals:   04/19/24 0949  BP: 138/68  Pulse: 73  Temp: 97.6 F (36.4 C)  TempSrc: Oral  SpO2: 100%  Weight: 225  lb 12.8 oz (102.4 kg)  Height: 5\' 3"  (1.6 m)    Last hemoglobin A1c Lab Results  Component Value Date   HGBA1C 13.3 (A) 04/19/2024    Physical Exam: Constitutional: Not in acute distress. Cardiovascular: regular rate and rhythm, no m/r/g Pulmonary/Chest: normal work of breathing on room air, lungs clear to auscultation bilaterally Abdominal: soft, non-tender, non-distended Assessment & Plan:   Patient discussed with Dr. Lelia Putnam  Benign essential HTN BP is better controlled today 138/68.  OP regimen includes Imdur  50 mg daily, lisinopril  40 mg.   Plan:  - Will continue current regimen as above.  Type 2 diabetes mellitus with complication, with long-term current use of insulin  (HCC) Uncontrolled type 2 diabetes, A1c increased from 12.5 >>to 13.3. Fasting BG has been between 250-330. OP regimen  includes Lantus  50 units, and metformin  500 mg twice daily. Will increase Lantus  to 56 units, will do twice a day dosing.   Plan: - Lantus  28 units twice daily. - Will increase metformin  to 1000 mg twice a day. - Recently got approved for Ozempic , is yet to start.  Will start with 0.25 mg weekly injection for 4 weeks, and titrate dose to 0.50 mg.  Obesity, Class III, BMI 40-49.9 (morbid obesity) (HCC) - Ozempic  0.25 mg as above.    Cindy Sins, MD [facility] Internal Medicine, PGY-1 Pager: [phone] Date 04/20/2024 Time 4:25 PM

## 2024-04-19 NOTE — Patient Instructions (Addendum)
 It was a pleasure taking care of you today!    For your blood pressure, continue lisinopril  40 mg and Imdur  50 mg  2.  Type 2 diabetes, take 28 units of Lantus  twice a day.   3.  Increase metformin  from 500 to 1000 mg twice a day  4.  Will follow-up on your Ozempic .  You will take 0.25 mg weekly injections.  I have ordered the following labs for you:   Lab Orders         Glucose, capillary         POC Hbg A1C       Follow up: 4 weeks.    Should you have any questions or concerns please call the internal medicine clinic at [phone].     Marni Sins, MD  [facility]

## 2024-04-20 NOTE — Assessment & Plan Note (Signed)
 BP is better controlled today 138/68.  OP regimen includes Imdur  50 mg daily, lisinopril  40 mg.   Plan:  - Will continue current regimen as above.

## 2024-04-20 NOTE — Assessment & Plan Note (Signed)
-   Ozempic  0.25 mg as above.

## 2024-04-20 NOTE — Assessment & Plan Note (Signed)
 Uncontrolled type 2 diabetes, A1c increased from 12.5 >>to 13.3. Fasting BG has been between 250-330. OP regimen includes Lantus  50 units, and metformin  500 mg twice daily. Will increase Lantus  to 56 units, will do twice a day dosing.   Plan: - Lantus  28 units twice daily. - Will increase metformin  to 1000 mg twice a day. - Recently got approved for Ozempic , is yet to start.  Will start with 0.25 mg weekly injection for 4 weeks, and titrate dose to 0.50 mg.

## 2024-04-28 ENCOUNTER — Other Ambulatory Visit (HOSPITAL_BASED_OUTPATIENT_CLINIC_OR_DEPARTMENT_OTHER): Payer: Self-pay

## 2024-04-28 ENCOUNTER — Other Ambulatory Visit: Payer: Self-pay

## 2024-04-28 ENCOUNTER — Telehealth

## 2024-04-28 ENCOUNTER — Other Ambulatory Visit (HOSPITAL_COMMUNITY): Payer: Self-pay

## 2024-04-28 MED ORDER — CYCLOBENZAPRINE HCL 10 MG PO TABS
10.0000 mg | ORAL_TABLET | Freq: Two times a day (BID) | ORAL | 0 refills | Status: AC | PRN
  Filled 2024-04-28: qty 20, 10d supply, fill #0

## 2024-04-30 NOTE — Progress Notes (Signed)
 Internal Medicine Clinic Attending  Case discussed with the resident at the time of the visit.  We reviewed the resident's history and exam and pertinent patient test results.  I agree with the assessment, diagnosis, and plan of care documented in the resident's note.

## 2024-05-21 ENCOUNTER — Other Ambulatory Visit (HOSPITAL_COMMUNITY): Payer: Self-pay

## 2024-05-21 ENCOUNTER — Ambulatory Visit: Admitting: Student

## 2024-05-21 VITALS — BP 138/82 | HR 72 | Temp 97.8°F | Ht 63.0 in | Wt 236.6 lb

## 2024-05-21 DIAGNOSIS — E1165 Type 2 diabetes mellitus with hyperglycemia: Secondary | ICD-10-CM | POA: Diagnosis not present

## 2024-05-21 DIAGNOSIS — M5441 Lumbago with sciatica, right side: Secondary | ICD-10-CM

## 2024-05-21 DIAGNOSIS — Z794 Long term (current) use of insulin: Secondary | ICD-10-CM

## 2024-05-21 DIAGNOSIS — E118 Type 2 diabetes mellitus with unspecified complications: Secondary | ICD-10-CM | POA: Diagnosis not present

## 2024-05-21 DIAGNOSIS — Z7985 Long-term (current) use of injectable non-insulin antidiabetic drugs: Secondary | ICD-10-CM

## 2024-05-21 DIAGNOSIS — E66813 Obesity, class 3: Secondary | ICD-10-CM

## 2024-05-21 DIAGNOSIS — Z7984 Long term (current) use of oral hypoglycemic drugs: Secondary | ICD-10-CM

## 2024-05-21 DIAGNOSIS — Z6841 Body Mass Index (BMI) 40.0 and over, adult: Secondary | ICD-10-CM

## 2024-05-21 DIAGNOSIS — E041 Nontoxic single thyroid nodule: Secondary | ICD-10-CM

## 2024-05-21 DIAGNOSIS — I10 Essential (primary) hypertension: Secondary | ICD-10-CM

## 2024-05-21 MED ORDER — SEMAGLUTIDE(0.25 OR 0.5MG/DOS) 2 MG/3ML ~~LOC~~ SOPN
0.2500 mg | PEN_INJECTOR | SUBCUTANEOUS | 3 refills | Status: DC
Start: 2024-05-21 — End: 2024-05-22
  Filled 2024-05-21: qty 3, 56d supply, fill #0

## 2024-05-21 NOTE — Progress Notes (Unsigned)
 Patient name: Cindy [name] Date of birth: [birth year] Date of visit: 05/21/24  Subjective   Chief concern: pain in back and hip  She's been told she has arthritis. Been ongoing for around a month. Getting worse since starting. 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 She went to urgent care for this, was prescribed patches and she has been taking Tylenol  which helps pain somewhat. Relief doesn't last long enough though.   Review of Systems  Constitutional:  Positive for malaise/fatigue. Negative for weight loss.  Respiratory:  Negative for cough and shortness of breath.   Cardiovascular:  Negative for chest pain, orthopnea and PND.  Gastrointestinal:  Negative for abdominal pain, blood in stool, constipation, diarrhea and vomiting.  Genitourinary:  Negative for hematuria.  Musculoskeletal:  Negative for falls.  Psychiatric/Behavioral:  Negative for depression. The patient has insomnia.     Current Outpatient Medications  Medication Instructions   atorvastatin  (LIPITOR ) 80 mg, Oral, Daily   Basaglar  KwikPen 28 Units, Subcutaneous, 2 times daily   Blood Glucose Monitoring Suppl (TRUE METRIX METER) w/Device KIT Use to test blood sugar 2 (two) times daily.   clopidogrel  (PLAVIX ) 75 mg, Oral, Daily   Continuous Glucose Sensor (FREESTYLE LIBRE 3 SENSOR) MISC Place 1 sensor on the skin every 14 days. Use to check glucose continuously   cyclobenzaprine  (FLEXERIL ) 10 MG tablet Take 1 tablet (10 mg total) by mouth 2 (two) times daily as needed as needed for muscle spasms.   diclofenac  Sodium (VOLTAREN ) 1 % GEL Apply 4 grams topically 4 (four) times daily.   dorzolamide-timolol (COSOPT) 2-0.5 % ophthalmic solution 1 drop, 2 times daily   ezetimibe  (ZETIA ) 10 mg, Oral, Daily   glucose blood test strip Use as instructed to test blood sugar 2 times daily   hydrochlorothiazide  (HYDRODIURIL ) 25 MG tablet Take 1/2 tablet (12.5 mg total) by mouth daily.   Insulin  Pen Needle  (PEN NEEDLES) 32G X 4 MM MISC Use as directed   Insulin  Pen Needle 32G X 4 MM MISC Use up to 2 (two) times daily as directed   isosorbide  mononitrate (IMDUR ) 15 mg, Oral, Daily   lisinopril  (ZESTRIL ) 40 mg, Oral, Daily   metFORMIN  (GLUCOPHAGE -XR) 1,000 mg, Oral, 2 times daily with meals   metoprolol  succinate (TOPROL -XL) 25 mg, Oral, Daily   nitroGLYCERIN  (NITROSTAT ) 0.4 mg, Sublingual, Every 5 min PRN   pantoprazole  (PROTONIX ) 20 mg, Oral, Daily   Semaglutide (0.25 or 0.5MG /DOS) 0.25 mg, Subcutaneous, Weekly   sertraline  (ZOLOFT ) 50 mg, Oral, Daily at bedtime   TRUEplus Lancets 28G MISC Use to test blood sugar 2 (two) times daily.   valACYclovir  (VALTREX ) 1,000 mg, Oral, 3 times daily     Objective  Today's Vitals   05/21/24 0956  BP: 138/82  Pulse: 72  Temp: 97.8 F (36.6 C)  TempSrc: Oral  SpO2: 97%  Weight: 236 lb 9.6 oz (107.3 kg)  Height: 5' 3 (1.6 m)  Body mass index is 41.91 kg/m.   Physical Exam Constitutional:      General: She is not in acute distress.    Appearance: Normal appearance.  HENT:     Right Ear: Tympanic membrane, ear canal and external ear normal.     Left Ear: Tympanic membrane, ear canal and external ear normal.     Mouth/Throat:     Mouth: Mucous membranes are moist.     Pharynx: No oropharyngeal exudate or posterior oropharyngeal erythema.   Eyes:     Extraocular Movements: Extraocular  movements intact.     Conjunctiva/sclera: Conjunctivae normal.     Pupils: Pupils are equal, round, and reactive to light.   Neck:     Thyroid: Thyroid mass present. No thyromegaly or thyroid tenderness.     Vascular: No carotid bruit.   Cardiovascular:     Rate and Rhythm: Normal rate and regular rhythm.     Pulses: Normal pulses.     Heart sounds: No murmur heard. Pulmonary:     Effort: Pulmonary effort is normal.     Breath sounds: Normal breath sounds. No wheezing or rales.  Abdominal:     Palpations: Abdomen is soft. There is no hepatomegaly,  splenomegaly or mass.     Tenderness: There is no abdominal tenderness.   Musculoskeletal:     Right lower leg: Edema present.     Left lower leg: Edema present.  Lymphadenopathy:     Cervical: No cervical adenopathy.   Skin:    General: Skin is warm and dry.   Neurological:     Mental Status: She is alert. Mental status is at baseline.     Cranial Nerves: No facial asymmetry.     Motor: No tremor.     Deep Tendon Reflexes: Reflexes normal.     Reflex Scores:      Patellar reflexes are 1+ on the right side and 2+ on the left side.  Psychiatric:        Mood and Affect: Mood normal.        Behavior: Behavior normal.      Assessment & Plan  Problem List Items Addressed This Visit   None  No follow-ups on file.  Ozell Kung MD 05/21/2024, 10:07 AM

## 2024-05-21 NOTE — Patient Instructions (Signed)
 Return in about 1 month (around 06/20/2024) for diabetes, hypertension, thyroid nodule.  Remember to bring all of the medications that you take (including over the counter medications and supplements) with you to every clinic visit.  This after visit summary is an important review of tests, referrals, and medication changes that were discussed during your visit. If you have questions or concerns, call [phone]. Outside of clinic business hours, call the main hospital at [phone] and ask the operator for the on-call internal medicine resident.   Ozell Kung MD 05/21/2024, 10:35 AM

## 2024-05-22 ENCOUNTER — Other Ambulatory Visit (HOSPITAL_COMMUNITY): Payer: Self-pay

## 2024-05-22 ENCOUNTER — Encounter: Payer: Self-pay | Admitting: Student

## 2024-05-22 ENCOUNTER — Ambulatory Visit: Payer: Self-pay | Admitting: Student

## 2024-05-22 DIAGNOSIS — M5441 Lumbago with sciatica, right side: Secondary | ICD-10-CM | POA: Insufficient documentation

## 2024-05-22 DIAGNOSIS — E041 Nontoxic single thyroid nodule: Secondary | ICD-10-CM

## 2024-05-22 LAB — T4, FREE: Free T4: 1.23 ng/dL (ref 0.82–1.77)

## 2024-05-22 LAB — TSH: TSH: 1.54 u[IU]/mL (ref 0.450–4.500)

## 2024-05-22 MED ORDER — SEMAGLUTIDE(0.25 OR 0.5MG/DOS) 2 MG/3ML ~~LOC~~ SOPN
0.2500 mg | PEN_INJECTOR | SUBCUTANEOUS | 1 refills | Status: DC
  Filled 2024-05-22: qty 3, 56d supply, fill #0

## 2024-05-22 MED ORDER — SEMAGLUTIDE (1 MG/DOSE) 4 MG/3ML ~~LOC~~ SOPN
1.0000 mg | PEN_INJECTOR | SUBCUTANEOUS | 1 refills | Status: DC
Start: 2024-07-17 — End: 2024-06-11
  Filled 2024-05-22: qty 3, 28d supply, fill #0

## 2024-05-22 NOTE — Assessment & Plan Note (Signed)
 Exam consistent with sciatica with some slightly diminished reflexes on the right.  No red flag symptoms.  Continue supportive therapy with Tylenol  and lidocaine  patches.  Avoid ibuprofen .  Start working on weight loss in earnest.

## 2024-05-22 NOTE — Assessment & Plan Note (Signed)
 She has a nodular thyroid.  TSH and free T4 normal.  Thyroid ultrasound pending.

## 2024-05-22 NOTE — Assessment & Plan Note (Signed)
 Starting Ozempic  0.25 mg weekly today.  Increase every 4 weeks as tolerated.

## 2024-05-22 NOTE — Assessment & Plan Note (Signed)
 138/82.  Continue HCTZ 12.5 mg daily, Imdur  and 50 mg daily, lisinopril  40 mg daily.

## 2024-05-22 NOTE — Assessment & Plan Note (Signed)
 Diabetes remains uncontrolled with hyperglycemia.  She has not yet started the Ozempic  that she was approved for after her last visit.  I recommend starting this at 0.25 mg weekly with dose increase every 4 weeks as tolerated.  Continue metformin  1000 mg twice daily and glargine 28 units twice daily.  She will need close follow-up as we work to get her blood sugar down.

## 2024-05-24 NOTE — Progress Notes (Signed)
 Internal Medicine Clinic Attending  Case discussed with the resident at the time of the visit.  We reviewed the resident's history and exam and pertinent patient test results.  I agree with the assessment, diagnosis, and plan of care documented in the resident's note.

## 2024-05-31 ENCOUNTER — Other Ambulatory Visit: Payer: Self-pay | Admitting: Internal Medicine

## 2024-06-04 NOTE — Progress Notes (Unsigned)
 06/05/2024 Cindy [name] [id number] 01/08/1966  Referring provider: Nooruddin, Saad, MD Primary GI doctor: Dr. Legrand  ASSESSMENT AND PLAN:  Uncontrolled type 2 diabetes Last A1c 04/19/2024 13.3 Last glucose 03/19/2024 was 436, no DKA Patient has lost about 15 lbs, long discussion and she will stop drinking the 3 sweet a day, will refer to endocrinology and hopefully we will be able to get sugars controlled enough for endoscopic evaluation - refer to endocrinology  Personal history of colon polyps 02/08/2017 colonoscopy for constipation and lower abdominal pain good prep 4 mm polyp transverse colon otherwise unremarkable Recall 01/2022 No changes in Bm's, no hematochezia Will check BMET today, get cardiac clearance, refer to endocrinology, and discuss with Dr. Legrand timing of colonoscopy and if he would like to schedule versus seeing patient back in office.  We have discussed the risks of bleeding, infection, perforation, medication reactions, and remote risk of death associated with colonoscopy. All questions were answered and the patient acknowledges these risk and wishes to proceed.  Epigastric discomfort with GERD, dysphagia intermittent only with large pills never with food, no odynophagia Denies nausea, vomiting, has had intentional weight loss 15 lbs 02/08/2017 EGD for epigastric discomfort unremarkable esophagus, bile gastritis, normal examined duodenum. On pantoprazole  20 mg once a day Likely from gastroparesis and uncontrolled sugars, no red flag symptoms at this time - increase pantoprazole  to 40 mg once a day -Lifestyle changes discussed, avoid NSAIDS, ETOH, hand out given to the patient -Weight loss discussed with the patient -Consider GES, gastroparesis diet given - consider EGD if any worsening symptoms  Coronary artery disease with chest pain status post PCI stents mid and distal LAD 05/03/2023 cath patent stents nonobstructive plaque -On Plavix  -Will get  cardiac clearance for procedure with DOE/chest pain and permission to hold plavix  for 5 days  Morbid obesity  Body mass index is 41.13 kg/m.  -Patient has been advised to make an attempt to improve diet and exercise patterns to aid in weight loss. -Recommended diet heavy in fruits and veggies and low in animal meats, cheeses, and dairy products, appropriate calorie intake   Patient Care Team: Nooruddin, Saad, MD as PCP - General Acharya, Gayatri A, MD as PCP - Cardiology (Cardiology)  HISTORY OF PRESENT ILLNESS: 58 y.o. female with a past medical history listed below presents for evaluation of colonoscopy.   Patient was last seen in the office 12/30/2016 by Dr. Legrand for epigastric abdominal pain and constipation.  Had subsequent EGD and colonoscopy which were overall unremarkable other than diminutive TA polyp recall 5 years.   Overall symptoms were thought to be secondary to uncontrolled diabetes.  Discussed the use of AI scribe software for clinical note transcription with the patient, who gave verbal consent to proceed.  History of Present Illness   Cindy [name] is a 58 year old female with uncontrolled type 2 diabetes who presents with abdominal pain and constipation. She is accompanied by her daughter-in-law.  She has a history of abdominal pain and constipation, with prior endoscopy and colonoscopy in 2018 revealing inflammation and a small 4 mm polyp, respectively. She continues to experience upper abdominal pain and heartburn, particularly after consuming certain foods like spaghetti. She is currently taking pantoprazole  20 mg once daily for heartburn.  She experiences difficulty swallowing large pills intermittently but denies odynophagia. She has intentionally lost 15 pounds. Her bowel movements are generally regular, with occasional constipation about once a month. No melena or hematochezia.  Her type 2 diabetes  is uncontrolled, with a recent A1c of 13.3 and glucose level  of 436. She monitors her blood sugar with finger sticks due to lack of insurance coverage for a continuous glucose monitor. She consumes three glasses of sweet tea daily.  She experiences intermittent chest pain, sometimes associated with exertion, and shortness of breath on exertion. A recent cardiac catheterization showed patent stents. She is on Plavix  following this procedure.  No recent episodes of diabetic ketoacidosis or hospital admissions related to her diabetes. She is not currently under the care of an endocrinologist for her diabetes management.      She  reports that she has never smoked. She has never used smokeless tobacco. She reports that she does not drink alcohol and does not use drugs.  RELEVANT GI HISTORY, IMAGING AND LABS: Results   LABS Glucose: 426 (02/2024) HbA1c: 13.3 (04/19/2024)  DIAGNOSTIC Endoscopy: Gastric inflammation (2018) Colonoscopy: 4 mm polyp (2018) Cardiac catheterization: All stents patent (04/2024)      CBC    Component Value Date/Time   WBC 7.9 07/01/2023 1307   RBC 3.63 (L) 07/01/2023 1307   HGB 9.9 (L) 07/01/2023 1307   HGB 9.3 (L) 04/28/2023 0941   HCT 33.3 (L) 07/01/2023 1307   HCT 30.1 (L) 04/28/2023 0941   PLT 451 (H) 07/01/2023 1307   PLT 491 (H) 04/28/2023 0941   MCV 91.7 07/01/2023 1307   MCV 92 04/28/2023 0941   MCV 96 04/05/2014 0013   MCH 27.3 07/01/2023 1307   MCHC 29.7 (L) 07/01/2023 1307   RDW 14.9 07/01/2023 1307   RDW 13.5 04/28/2023 0941   RDW 13.5 04/05/2014 0013   LYMPHSABS 2.5 05/08/2023 0924   LYMPHSABS 3.7 (H) 04/05/2014 0013   MONOABS 0.8 05/08/2023 0924   MONOABS 0.9 04/05/2014 0013   EOSABS 0.1 05/08/2023 0924   EOSABS 0.3 04/05/2014 0013   BASOSABS 0.0 05/08/2023 0924   BASOSABS 0.0 04/05/2014 0013   Recent Labs    07/01/23 1307  HGB 9.9*    CMP     Component Value Date/Time   NA 139 03/19/2024 1036   NA 137 04/05/2014 0013   K 4.8 03/19/2024 1036   K 4.1 04/05/2014 0013   CL 101  03/19/2024 1036   CL 102 04/05/2014 0013   CO2 25 03/19/2024 1036   CO2 28 04/05/2014 0013   GLUCOSE 436 (H) 03/19/2024 1036   GLUCOSE 299 (H) 07/01/2023 1307   GLUCOSE 303 (H) 04/05/2014 0013   BUN 12 03/19/2024 1036   BUN 18 04/05/2014 0013   CREATININE 0.70 03/19/2024 1036   CREATININE 1.03 04/05/2014 0013   CALCIUM  9.0 03/19/2024 1036   CALCIUM  9.4 04/05/2014 0013   PROT 7.6 05/08/2023 0924   PROT 7.2 12/28/2022 1130   ALBUMIN 3.1 (L) 05/08/2023 0924   ALBUMIN 3.7 (L) 12/28/2022 1130   AST 15 05/08/2023 0924   ALT 13 05/08/2023 0924   ALKPHOS 85 05/08/2023 0924   BILITOT 0.5 05/08/2023 0924   BILITOT <0.2 12/28/2022 1130   GFRNONAA >60 07/01/2023 1307   GFRNONAA >60 04/05/2014 0013   GFRAA >60 07/09/2020 0448   GFRAA >60 04/05/2014 0013      Latest Ref Rng & Units 05/08/2023    9:24 AM 12/28/2022   11:30 AM 07/03/2022   11:19 AM  Hepatic Function  Total Protein 6.5 - 8.1 g/dL 7.6  7.2  8.4   Albumin 3.5 - 5.0 g/dL 3.1  3.7  3.9   AST 15 - 41 U/L 15  10  8   ALT 0 - 44 U/L 13  13  8    Alk Phosphatase 38 - 126 U/L 85  123  83   Total Bilirubin 0.3 - 1.2 mg/dL 0.5  <9.7  0.2   Bilirubin, Direct 0.00 - 0.40 mg/dL  <9.89        Current Medications:   Current Outpatient Medications (Endocrine & Metabolic):    Insulin  Glargine (BASAGLAR  KWIKPEN) 100 UNIT/ML, Inject 28 Units into the skin in the morning and at bedtime.   metFORMIN  (GLUCOPHAGE -XR) 500 MG 24 hr tablet, Take 2 tablets (1,000 mg total) by mouth 2 (two) times daily with a meal.   [START ON 07/17/2024] Semaglutide , 1 MG/DOSE, 4 MG/3ML SOPN, Inject 1 mg into the skin once a week.   Semaglutide ,0.25 or 0.5MG /DOS, 2 MG/3ML SOPN, Inject 0.25 mg into the skin once a week.  Current Outpatient Medications (Cardiovascular):    atorvastatin  (LIPITOR ) 80 MG tablet, Take 1 tablet (80 mg total) by mouth daily.   ezetimibe  (ZETIA ) 10 MG tablet, Take 1 tablet by mouth once daily   hydrochlorothiazide  (HYDRODIURIL ) 25 MG  tablet, Take 1/2 tablet (12.5 mg total) by mouth daily.   isosorbide  mononitrate (IMDUR ) 30 MG 24 hr tablet, Take 0.5 tablets (15 mg total) by mouth daily.   lisinopril  (ZESTRIL ) 40 MG tablet, Take 1 tablet by mouth once daily   metoprolol  succinate (TOPROL -XL) 25 MG 24 hr tablet, Take 1 tablet (25 mg total) by mouth daily.   nitroGLYCERIN  (NITROSTAT ) 0.4 MG SL tablet, Place 1 tablet (0.4 mg total) under the tongue every 5 (five) minutes as needed for chest pain.    Current Outpatient Medications (Hematological):    clopidogrel  (PLAVIX ) 75 MG tablet, Take 1 tablet (75 mg total) by mouth daily.  Current Outpatient Medications (Other):    Blood Glucose Monitoring Suppl (TRUE METRIX METER) w/Device KIT, Use to test blood sugar 2 (two) times daily.   Continuous Glucose Sensor (FREESTYLE LIBRE 3 SENSOR) MISC, Place 1 sensor on the skin every 14 days. Use to check glucose continuously   cyclobenzaprine  (FLEXERIL ) 10 MG tablet, Take 1 tablet (10 mg total) by mouth 2 (two) times daily as needed as needed for muscle spasms.   diclofenac  Sodium (VOLTAREN ) 1 % GEL, Apply 4 grams topically 4 (four) times daily.   dorzolamide-timolol (COSOPT) 2-0.5 % ophthalmic solution, Place 1 drop into both eyes 2 (two) times daily.   glucose blood test strip, Use as instructed to test blood sugar 2 times daily   Insulin  Pen Needle (PEN NEEDLES) 32G X 4 MM MISC, Use as directed   Insulin  Pen Needle 32G X 4 MM MISC, Use up to 2 (two) times daily as directed   pantoprazole  (PROTONIX ) 40 MG tablet, Take 1 tablet (40 mg total) by mouth daily.   sertraline  (ZOLOFT ) 50 MG tablet, Take 1 tablet (50 mg total) by mouth at bedtime.   TRUEplus Lancets 28G MISC, Use to test blood sugar 2 (two) times daily.   valACYclovir  (VALTREX ) 1000 MG tablet, Take 1 tablet (1,000 mg total) by mouth 3 (three) times daily.  Medical History:  Past Medical History:  Diagnosis Date   Anginal chest pain at rest [facility]) 02/17/2023   CAD in native  artery 07/09/2020   Coronary artery disease    Diabetes mellitus    Generalized pruritus 05/13/2023   Generalized pruritus 05/13/2023   GERD (gastroesophageal reflux disease)    Hyperlipemia    Hypertension    Precordial chest pain  S/P angioplasty with stent 07/09/2020   Shingles 07/04/2023   Stroke (HCC)    Unstable angina (HCC) 07/06/2020   Allergies: No Known Allergies   Surgical History:  She  has a past surgical history that includes None; LEFT HEART CATH AND CORONARY ANGIOGRAPHY (N/A, 07/07/2020); Coronary Ultrasound/IVUS (N/A, 07/08/2020); CORONARY STENT INTERVENTION (N/A, 07/08/2020); Cardiac catheterization; and LEFT HEART CATH AND CORONARY ANGIOGRAPHY (N/A, 05/03/2023). Family History:  Her family history includes Breast cancer in her maternal grandmother; Breast cancer (age of onset: 79) in her mother; Diabetes in her mother; Heart failure in her father; Hypertension in her father and mother; Stroke in her brother and mother.  REVIEW OF SYSTEMS  : All other systems reviewed and negative except where noted in the History of Present Illness.  PHYSICAL EXAM: BP 110/60   Pulse 80   Ht 5' 3 (1.6 m)   Wt 232 lb 3.2 oz (105.3 kg)   LMP 08/11/2016   BMI 41.13 kg/m  Physical Exam   GENERAL APPEARANCE: obese, in no apparent distress. HEENT: No cervical lymphadenopathy, unremarkable thyroid, sclerae anicteric, conjunctiva pink, enlarged tonsils. RESPIRATORY: Respiratory effort normal, breath sounds equal bilaterally without rales, rhonchi, or wheezing. Lungs clear to auscultation bilaterally. CARDIO: Regular rate and rhythm with no murmurs, rubs, or gallops, peripheral pulses intact. Heart sounds normal. ABDOMEN: Soft, non-distended, active bowel sounds in all four quadrants, no tenderness to palpation, no rebound, no mass appreciated. RECTAL: Declines. MUSCULOSKELETAL: Full range of motion, normal gait, without edema. SKIN: Dry, intact without rashes or lesions. No  jaundice. NEURO: Alert, oriented, no focal deficits. PSYCH: Cooperative, normal mood and affect.      Alan JONELLE Coombs, PA-C 11:53 AM

## 2024-06-05 ENCOUNTER — Telehealth: Payer: Self-pay | Admitting: *Deleted

## 2024-06-05 ENCOUNTER — Other Ambulatory Visit (HOSPITAL_COMMUNITY): Payer: Self-pay

## 2024-06-05 ENCOUNTER — Other Ambulatory Visit (INDEPENDENT_AMBULATORY_CARE_PROVIDER_SITE_OTHER)

## 2024-06-05 ENCOUNTER — Ambulatory Visit: Payer: Self-pay | Admitting: Physician Assistant

## 2024-06-05 ENCOUNTER — Other Ambulatory Visit: Payer: Self-pay | Admitting: *Deleted

## 2024-06-05 ENCOUNTER — Ambulatory Visit: Admitting: Physician Assistant

## 2024-06-05 VITALS — BP 110/60 | HR 80 | Ht 63.0 in | Wt 232.2 lb

## 2024-06-05 DIAGNOSIS — I2510 Atherosclerotic heart disease of native coronary artery without angina pectoris: Secondary | ICD-10-CM | POA: Diagnosis not present

## 2024-06-05 DIAGNOSIS — Z794 Long term (current) use of insulin: Secondary | ICD-10-CM

## 2024-06-05 DIAGNOSIS — K219 Gastro-esophageal reflux disease without esophagitis: Secondary | ICD-10-CM | POA: Diagnosis not present

## 2024-06-05 DIAGNOSIS — E118 Type 2 diabetes mellitus with unspecified complications: Secondary | ICD-10-CM

## 2024-06-05 DIAGNOSIS — Z860101 Personal history of adenomatous and serrated colon polyps: Secondary | ICD-10-CM

## 2024-06-05 DIAGNOSIS — E66813 Obesity, class 3: Secondary | ICD-10-CM

## 2024-06-05 DIAGNOSIS — D508 Other iron deficiency anemias: Secondary | ICD-10-CM

## 2024-06-05 LAB — CBC WITH DIFFERENTIAL/PLATELET
Basophils Absolute: 0 K/uL (ref 0.0–0.1)
Basophils Relative: 0.2 % (ref 0.0–3.0)
Eosinophils Absolute: 0.1 K/uL (ref 0.0–0.7)
Eosinophils Relative: 1 % (ref 0.0–5.0)
HCT: 31.3 % — ABNORMAL LOW (ref 36.0–46.0)
Hemoglobin: 9.9 g/dL — ABNORMAL LOW (ref 12.0–15.0)
Lymphocytes Relative: 33.4 % (ref 12.0–46.0)
Lymphs Abs: 2.2 K/uL (ref 0.7–4.0)
MCHC: 31.6 g/dL (ref 30.0–36.0)
MCV: 87.7 fl (ref 78.0–100.0)
Monocytes Absolute: 0.7 K/uL (ref 0.1–1.0)
Monocytes Relative: 10.2 % (ref 3.0–12.0)
Neutro Abs: 3.6 K/uL (ref 1.4–7.7)
Neutrophils Relative %: 55.2 % (ref 43.0–77.0)
Platelets: 385 K/uL (ref 150.0–400.0)
RBC: 3.57 Mil/uL — ABNORMAL LOW (ref 3.87–5.11)
RDW: 16.9 % — ABNORMAL HIGH (ref 11.5–15.5)
WBC: 6.6 K/uL (ref 4.0–10.5)

## 2024-06-05 LAB — IBC + FERRITIN
Ferritin: 7.3 ng/mL — ABNORMAL LOW (ref 10.0–291.0)
Iron: 38 ug/dL — ABNORMAL LOW (ref 42–145)
Saturation Ratios: 10.6 % — ABNORMAL LOW (ref 20.0–50.0)
TIBC: 359.8 ug/dL (ref 250.0–450.0)
Transferrin: 257 mg/dL (ref 212.0–360.0)

## 2024-06-05 LAB — COMPREHENSIVE METABOLIC PANEL WITH GFR
ALT: 9 U/L (ref 0–35)
AST: 12 U/L (ref 0–37)
Albumin: 3.6 g/dL (ref 3.5–5.2)
Alkaline Phosphatase: 65 U/L (ref 39–117)
BUN: 16 mg/dL (ref 6–23)
CO2: 28 meq/L (ref 19–32)
Calcium: 8.7 mg/dL (ref 8.4–10.5)
Chloride: 107 meq/L (ref 96–112)
Creatinine, Ser: 0.8 mg/dL (ref 0.40–1.20)
GFR: 80.53 mL/min (ref >60.00)
Glucose, Bld: 106 mg/dL — ABNORMAL HIGH (ref 70–99)
Potassium: 3.8 meq/L (ref 3.5–5.1)
Sodium: 141 meq/L (ref 135–145)
Total Bilirubin: 0.2 mg/dL (ref 0.2–1.2)
Total Protein: 7.6 g/dL (ref 6.0–8.3)

## 2024-06-05 MED ORDER — NA SULFATE-K SULFATE-MG SULF 17.5-3.13-1.6 GM/177ML PO SOLN
1.0000 | Freq: Once | ORAL | 0 refills | Status: AC
  Filled 2024-06-05: qty 354, 2d supply, fill #0

## 2024-06-05 MED ORDER — PANTOPRAZOLE SODIUM 40 MG PO TBEC
40.0000 mg | DELAYED_RELEASE_TABLET | Freq: Every day | ORAL | 3 refills | Status: AC
  Filled 2024-06-05: qty 90, 90d supply, fill #0
  Filled 2024-09-27: qty 90, 90d supply, fill #1
  Filled 2025-01-02: qty 90, 90d supply, fill #2

## 2024-06-05 MED ORDER — PANTOPRAZOLE SODIUM 20 MG PO TBEC
20.0000 mg | DELAYED_RELEASE_TABLET | Freq: Two times a day (BID) | ORAL | 2 refills | Status: DC
  Filled 2024-06-05: qty 180, 90d supply, fill #0

## 2024-06-05 MED ORDER — PANTOPRAZOLE SODIUM 40 MG PO TBEC
40.0000 mg | DELAYED_RELEASE_TABLET | Freq: Two times a day (BID) | ORAL | 0 refills | Status: DC
  Filled 2024-06-05: qty 180, 90d supply, fill #0

## 2024-06-05 NOTE — Telephone Encounter (Signed)
 Kidney function okay, glucose much better controlled.  Cbc with anemia and patient does have a true iron def anemia.  Patient with iron def, please add on iron 325 mg once daily,take it with 500mg  Vit C or orange juice to increase absorption. If you can not tolerate the iron due to abdominal pain/constipation/nausea, let us  know and we can set you up for transfusions.  The iron can turn your stool dark.    Would suggest we proceed with endoscopic evaluation with her sugar much improved currently at 106, she had lost 15 lbs and going forward after our conversation will stop sweet tea.  Lets continue with cardiac clearance and endocrine referral, set up EGD and colonoscopy at [facility] due to iron def with Dr. Legrand. Suggest rechecking CBC and sugar day before. Please emphasize need to keep strict control of sugars and regular monitoring  06/05/2024  3:53 PM EDT Back to Top    I have spoke with the patient and she understands recommendations by [facility]. She has been scheduled for a colon/Endo with Dr Legrand on Tamara 07/22/2024 at 9 am. Patient would like to receive her instructions through My Chart and also I will mail her a copy. Patient is worried about constipation with the oral Iron. I told her to try a capful of Miralax daily and increase to two if no results. Drink plenty of water. She will call us  back if she can not tolerate the iron. Waiting on Plavix  and Cardiac Clearance's. Patient is also on Semaglutide  to hold 7 day    She is also on a long acting diabetic med called Basaglar  and Metformin , I had to fill in on her instructions after they were printed. Mailed to patient today

## 2024-06-05 NOTE — Patient Instructions (Addendum)
 Your provider has requested that you go to the basement level for lab work before leaving today. Press B on the elevator. The lab is located at the first door on the left as you exit the elevator.  Gastroparesis Gastroparesis is a condition in which food takes longer than normal to empty from the stomach.  This condition is also known as delayed gastric emptying. It is usually a long-term (chronic) condition.  What are the signs or symptoms? Symptoms of this condition include: Feeling full after eating very little or a loss of appetite. Nausea, vomiting, or heartburn. Bloating of your abdomen. Inconsistent blood sugar (glucose) levels on blood tests. Unexplained weight loss. Acid from the stomach coming up into the esophagus (gastroesophageal reflux). Sudden tightening (spasm) of the stomach, which can be painful. Symptoms may come and go. Some people may not notice any symptoms.  What increases the risk? You are more likely to develop this condition if: You have certain disorders or diseases. These may include: An endocrine disorder. An eating disorder. Amyloidosis. Scleroderma. Parkinson's disease. Multiple sclerosis. Cancer or infection of the stomach or the vagus nerve. You have had surgery on your stomach or vagus nerve. You take certain medicines. You are female.  Things you can do: Please do small frequent meals like 4-6 meals a day.  Eat and drink liquids at separate times.  Avoid high fiber foods, cook your vegetables, avoid high fat food.  Suggest spreading protein throughout the day (greek yogurt, glucerna, soft meat, milk, eggs) Choose soft foods that you can mash with a fork When you are more symptomatic, change to pureed foods foods and liquids.  Consider reading Living well with Gastroparesis by [facility] Check out this link to a diet  online [email or web address]  We will refer you to Endocrinology  Due to recent changes in healthcare laws, you may see the results of your imaging and laboratory studies on MyChart before your provider has had a chance to review them.  We understand that in some cases there may be results that are confusing or concerning to you. Not all laboratory results come back in the same time frame and the provider may be waiting for multiple results in order to interpret others.  Please give us  48 hours in order for your provider to thoroughly review all the results before contacting the office for clarification of your results.

## 2024-06-05 NOTE — Telephone Encounter (Signed)
 [facility] Pre-operative Risk Assessment     Request for surgical clearance:     Endoscopy Procedure  What type of surgery is being performed?     Colonoscopy Screening   When is this surgery scheduled?     TBD  What type of clearance is required ?   Pharmacy and cardiac   Are there any medications that need to be held prior to surgery and how long? 5 days  Practice name and name of physician performing surgery?      [facility]  What is your office phone and fax number?      Phone- [phone]  Fax- [phone]  Anesthesia type (None, local, MAC, general) ?       MAC   Please route your response to [employer]

## 2024-06-06 NOTE — Telephone Encounter (Signed)
 I d/w the preop APP Lamarr Satterfield, DNP for clarification in her notes about appt type. Per Lamarr Satterfield, DNP should schedule in office appt as pt has not been seen in almost 1 yr.   Left message for the pt to call back and schedule an appt in office for preop clearance.

## 2024-06-06 NOTE — Progress Notes (Signed)
    ____________________________________________________________  Attending physician addendum:  Thank you for sending this case to me. I have reviewed the entire note and agree with the plan.  After reading this note, I initially felt the procedure may need to be delayed until she has better diabetic control, though I see the labs after your visit had a glucose of 106 and that she is also iron deficient and anemic.  I agree with your subsequent result note to nursing that she had both an EGD and colonoscopy with me on the scheduled date, and she still needs evaluation by endocrinology and cardiac clearance for a preprocedure Plavix  hold. We need to be sure she has instructions to hold iron 5 to 7 days before the procedure so she will get a sufficient quality bowel prep.  If she is unable to tolerate oral iron due to GI upset or constipation, then IV iron at the [facility] outpatient infusion center can be arranged.  Victory Brand, MD  ____________________________________________________________

## 2024-06-06 NOTE — Telephone Encounter (Signed)
   Name: Cindy [name]  DOB: 08/11/1966  MRN: [id number]  Primary Cardiologist: Soyla DELENA Merck, MD   Preoperative team, please contact this patient and set up a phone call appointment for further preoperative risk assessment. Please obtain consent and complete medication review. Thank you for your help. Has not been seen since 07/13/2023, by Mercy Hails, PA. If cannot see patient virtually until August, should be an in person appointment.  I confirm that guidance regarding antiplatelet and oral anticoagulation therapy has been completed and, if necessary, noted below.Per office protocol, if patient is without any new symptoms or concerns at the time of their virtual visit, she may hold Plavix  for 5 days prior to procedure. Please resume Plavix  as soon as possible postprocedure, at the discretion of the surgeon.    I also confirmed the patient resides in the state of [place] . As per [facility] Medical Board telemedicine laws, the patient must reside in the state in which the provider is licensed.   Lamarr Satterfield, NP 06/06/2024, 8:39 AM [facility]

## 2024-06-07 ENCOUNTER — Other Ambulatory Visit: Payer: Self-pay

## 2024-06-07 MED ORDER — HYDROCHLOROTHIAZIDE 25 MG PO TABS: 12.5000 mg | ORAL_TABLET | Freq: Every day | ORAL | 0 refills | Status: DC

## 2024-06-07 NOTE — Telephone Encounter (Signed)
 Pt scheduled for OV on 07/09/24.

## 2024-06-11 ENCOUNTER — Ambulatory Visit: Admitting: Student

## 2024-06-11 ENCOUNTER — Other Ambulatory Visit (HOSPITAL_COMMUNITY): Payer: Self-pay

## 2024-06-11 ENCOUNTER — Other Ambulatory Visit: Payer: Self-pay | Admitting: Student

## 2024-06-11 ENCOUNTER — Other Ambulatory Visit: Payer: Self-pay

## 2024-06-11 VITALS — BP 117/49 | HR 73 | Temp 97.5°F | Ht 63.0 in | Wt 234.0 lb

## 2024-06-11 DIAGNOSIS — Z7985 Long-term (current) use of injectable non-insulin antidiabetic drugs: Secondary | ICD-10-CM | POA: Diagnosis not present

## 2024-06-11 DIAGNOSIS — E66813 Obesity, class 3: Secondary | ICD-10-CM

## 2024-06-11 DIAGNOSIS — I10 Essential (primary) hypertension: Secondary | ICD-10-CM | POA: Diagnosis not present

## 2024-06-11 DIAGNOSIS — E118 Type 2 diabetes mellitus with unspecified complications: Secondary | ICD-10-CM | POA: Diagnosis not present

## 2024-06-11 DIAGNOSIS — E785 Hyperlipidemia, unspecified: Secondary | ICD-10-CM

## 2024-06-11 DIAGNOSIS — Z794 Long term (current) use of insulin: Secondary | ICD-10-CM

## 2024-06-11 DIAGNOSIS — Z7984 Long term (current) use of oral hypoglycemic drugs: Secondary | ICD-10-CM

## 2024-06-11 MED ORDER — FREESTYLE LIBRE 3 SENSOR MISC
3 refills | Status: DC
Start: 2024-06-11 — End: 2024-10-19
  Filled 2024-06-11: qty 2, 28d supply, fill #0
  Filled 2024-07-03: qty 2, 28d supply, fill #1

## 2024-06-11 MED ORDER — BASAGLAR KWIKPEN 100 UNIT/ML ~~LOC~~ SOPN
26.0000 [IU] | PEN_INJECTOR | Freq: Two times a day (BID) | SUBCUTANEOUS | 4 refills | Status: DC
  Filled 2024-06-11 – 2024-06-13 (×2): qty 15, 29d supply, fill #0
  Filled 2024-07-26: qty 15, 29d supply, fill #1
  Filled 2024-09-03 – 2024-09-27 (×2): qty 15, 29d supply, fill #2
  Filled 2024-10-25: qty 15, 29d supply, fill #3
  Filled 2024-11-30: qty 15, 29d supply, fill #4

## 2024-06-11 MED ORDER — SEMAGLUTIDE(0.25 OR 0.5MG/DOS) 2 MG/3ML ~~LOC~~ SOPN
0.5000 mg | PEN_INJECTOR | SUBCUTANEOUS | 3 refills | Status: DC
  Filled 2024-06-11: qty 3, fill #0
  Filled 2024-06-27: qty 3, 28d supply, fill #0

## 2024-06-11 NOTE — Patient Instructions (Addendum)
 Thank you so much for coming to the clinic today!   For your diabetes, we are increasing your ozempic  to .5mg , and DECREASING your insulin  to 26U twice a day  Please stop taking the hydrochlorothiazide  I'd like to see you back in a month to recheck your blood pressure (please start keeping track at home and bring the numbers with you).  For your pain, I think the best thing would be to continue with exercising as tolerating, as any stronger pain medication would make you feel dizzy.   If you have any questions please feel free to the call the clinic at anytime at [phone]. It was a pleasure seeing you!  Best, Dr. Shaketha Jeon

## 2024-06-11 NOTE — Assessment & Plan Note (Signed)
 Last A1c checked in May was 13.3. Her current regimen is Ozempic  .25mg  weekly, Glargine 28U BID, and Metformin  1000mg  BID. She does check her sugar at home via fingerstick, and her values seem to be in the 105-115 range fasting, however I was unable to verify as she did not bring it in. Her weight has been relatively stable, hovering around 235-240 chronically. She has been trying to walk more frequently and eat better. The next best step for her will be to increase the Ozempic  to .5mg  and see how she does with this. She is due for an A1c check in about a month or so, so will see her back relatively soon.   Her sugars at home seem to be well controlled, but I think she would benefit from a CGM, so will send that in. To avoid episodes of hypoglycemia given increase in Ozempic , will decrease long acting insulin  to 26U BID instead of 28U.   Plan:  - Increase Ozempic  to .5mg , instructed to uptitrate every 4 weeks if tolerating well  - Slightly decrease insulin  from 28U BID to 26U BID  - Continue Metformin  1000mg  BBID

## 2024-06-11 NOTE — Progress Notes (Signed)
 CC: T2DM/HTN follow up  HPI:  [name] is a 58 y.o. female living with a history stated below and presents today for T2DM, HTN follow up. Please see problem based assessment and plan for additional details.  Past Medical History:  Diagnosis Date   Anginal chest pain at rest [facility]) 02/17/2023   CAD in native artery 07/09/2020   Coronary artery disease    Diabetes mellitus    Generalized pruritus 05/13/2023   Generalized pruritus 05/13/2023   GERD (gastroesophageal reflux disease)    Hyperlipemia    Hypertension    Precordial chest pain    S/P angioplasty with stent 07/09/2020   Shingles 07/04/2023   Stroke (HCC)    Unstable angina (HCC) 07/06/2020    Current Outpatient Medications on File Prior to Visit  Medication Sig Dispense Refill   atorvastatin  (LIPITOR ) 80 MG tablet Take 1 tablet (80 mg total) by mouth daily. 90 tablet 3   Blood Glucose Monitoring Suppl (TRUE METRIX METER) w/Device KIT Use to test blood sugar 2 (two) times daily. 1 kit 0   clopidogrel  (PLAVIX ) 75 MG tablet Take 1 tablet (75 mg total) by mouth daily. 90 tablet 2   cyclobenzaprine  (FLEXERIL ) 10 MG tablet Take 1 tablet (10 mg total) by mouth 2 (two) times daily as needed as needed for muscle spasms. 20 tablet 0   diclofenac  Sodium (VOLTAREN ) 1 % GEL Apply 4 grams topically 4 (four) times daily. 400 g 0   dorzolamide-timolol (COSOPT) 2-0.5 % ophthalmic solution Place 1 drop into both eyes 2 (two) times daily.     ezetimibe  (ZETIA ) 10 MG tablet Take 1 tablet by mouth once daily 90 tablet 1   glucose blood test strip Use as instructed to test blood sugar 2 times daily 100 each 12   Insulin  Pen Needle (PEN NEEDLES) 32G X 4 MM MISC Use as directed 100 each 0   Insulin  Pen Needle 32G X 4 MM MISC Use up to 2 (two) times daily as directed 100 each 3   isosorbide  mononitrate (IMDUR ) 30 MG 24 hr tablet Take 0.5 tablets (15 mg total) by mouth daily. 45 tablet 1   lisinopril  (ZESTRIL ) 40 MG tablet Take 1  tablet by mouth once daily 90 tablet 0   metFORMIN  (GLUCOPHAGE -XR) 500 MG 24 hr tablet Take 2 tablets (1,000 mg total) by mouth 2 (two) times daily with a meal. 120 tablet 2   metoprolol  succinate (TOPROL -XL) 25 MG 24 hr tablet Take 1 tablet (25 mg total) by mouth daily. 90 tablet 1   nitroGLYCERIN  (NITROSTAT ) 0.4 MG SL tablet Place 1 tablet (0.4 mg total) under the tongue every 5 (five) minutes as needed for chest pain. 25 tablet 4   pantoprazole  (PROTONIX ) 40 MG tablet Take 1 tablet (40 mg total) by mouth daily. 90 tablet 3   sertraline  (ZOLOFT ) 50 MG tablet Take 1 tablet (50 mg total) by mouth at bedtime. 90 tablet 0   TRUEplus Lancets 28G MISC Use to test blood sugar 2 (two) times daily. 100 each 0   valACYclovir  (VALTREX ) 1000 MG tablet Take 1 tablet (1,000 mg total) by mouth 3 (three) times daily. 21 tablet 0   No current facility-administered medications on file prior to visit.    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  Tobacco Use   Smoking status: Never   Smokeless tobacco: Never  Vaping Use   Vaping status: Never Used  Substance and Sexual Activity   Alcohol use: No   Drug use: No   Sexual activity: Not Currently    Birth control/protection: None  Other Topics Concern   Not on file  Social History Narrative   Not on file   Social Drivers of Health   Financial Resource Strain: Not on file  Food  Insecurity: Not on file  Transportation Needs: Not on file  Physical Activity: Not on file  Stress: Not on file  Social Connections: Not on file  Intimate Partner Violence: Not on file    Review of Systems: ROS negative except for what is noted on the assessment and plan.  Vitals:   06/11/24 1002 06/11/24 1024  BP: (!) 112/52 (!) 117/49  Pulse: 74 73  Temp: (!) 97.5 F (36.4 C)   TempSrc: Oral   SpO2: 97%   Weight: 234 lb (106.1 kg)   Height: 5' 3 (1.6 m)     Physical Exam: Constitutional: well-appearing female  in no acute distress Cardiovascular: regular rate and rhythm, no m/r/g Pulmonary/Chest: normal work of breathing on room air, lungs clear to auscultation bilaterally   Assessment & Plan:   Type 2 diabetes mellitus with complication, with long-term current use of insulin  (HCC) Last A1c checked in May was 13.3. Her current regimen is Ozempic  .25mg  weekly, Glargine 28U BID, and Metformin  1000mg  BID. She does check her sugar at home via fingerstick, and her values seem to be in the 105-115 range fasting, however I was unable to verify as she did not bring it in. Her weight has been relatively stable, hovering around 235-240 chronically. She has been trying to walk more frequently and eat better. The next best step for her will be to increase the Ozempic  to .5mg  and see how she does with this. She is due for an A1c check in about a month or so, so will see her back relatively soon.   Her sugars at home seem to be well controlled, but I think she would benefit from a CGM, so will send that in. To avoid episodes of hypoglycemia given increase in Ozempic , will decrease long acting insulin  to 26U BID instead of 28U.   Plan:  - Increase Ozempic  to .5mg , instructed to uptitrate every 4 weeks if tolerating well  - Slightly decrease insulin  from 28U BID to 26U BID  - Continue Metformin  1000mg  BBID  Benign essential HTN  Vitals:   06/11/24 1002 06/11/24 1024  BP: (!) 112/52  (!) 117/49     Has been taking hydrochlorothiazide  12.5mg , Imdur , Metoprolol , and Lisnopril 40mg , which she did take today. Her blood pressure is on the lower end today, most notable is her diastolic blood pressure as seen above. She does state that she gets dizzy from positional changes, especially in the  morning after taking her medications. Orthostatics in clinic are negative (outlined below).    Most Recent 06/14/19 - 06/11/24  BP- Lying 120/63 06/11/24 10:39  Pulse- Lying 71 06/11/24 10:39  BP- Sitting 121/60 06/11/24 10:39  Pulse- Sitting 70 06/11/24 10:39  BP- Standing at 0 minutes 131/63 06/11/24 10:39  Pulse- Standing at 0 minutes 75 06/11/24 10:39   Given her low diastolic blood pressure and symptoms that seem consistent with intermittent hypotension at home, will stop hydrochlorothiazide . She has agreed to check her blood pressure at home daily and will bring in her logs.   Plan:  - Stop hydrochlorothiazide   - Continue Imdur  15mg , Lisinopril  40mg , Metoprolol  25mg   Patient discussed with Dr. Karna Dirks Lynx Goodrich, M.D. [facility] Medicine, PGY-3 Pager: [phone] Date 06/11/2024 Time 1:54 PM

## 2024-06-11 NOTE — Assessment & Plan Note (Signed)
  Vitals:   06/11/24 1002 06/11/24 1024  BP: (!) 112/52 (!) 117/49     Has been taking hydrochlorothiazide  12.5mg , Imdur , Metoprolol , and Lisnopril 40mg , which she did take today. Her blood pressure is on the lower end today, most notable is her diastolic blood pressure as seen above. She does state that she gets dizzy from positional changes, especially in the morning after taking her medications. Orthostatics in clinic are negative (outlined below).    Most Recent 06/14/19 - 06/11/24  BP- Lying 120/63 06/11/24 10:39  Pulse- Lying 71 06/11/24 10:39  BP- Sitting 121/60 06/11/24 10:39  Pulse- Sitting 70 06/11/24 10:39  BP- Standing at 0 minutes 131/63 06/11/24 10:39  Pulse- Standing at 0 minutes 75 06/11/24 10:39   Given her low diastolic blood pressure and symptoms that seem consistent with intermittent hypotension at home, will stop hydrochlorothiazide . She has agreed to check her blood pressure at home daily and will bring in her logs.   Plan:  - Stop hydrochlorothiazide   - Continue Imdur  15mg , Lisinopril  40mg , Metoprolol  25mg

## 2024-06-12 ENCOUNTER — Other Ambulatory Visit (HOSPITAL_COMMUNITY): Payer: Self-pay

## 2024-06-12 ENCOUNTER — Encounter (HOSPITAL_COMMUNITY): Payer: Self-pay

## 2024-06-13 ENCOUNTER — Other Ambulatory Visit (HOSPITAL_COMMUNITY): Payer: Self-pay

## 2024-06-13 NOTE — Progress Notes (Signed)
 Internal Medicine Clinic Attending  Case discussed with the resident at the time of the visit.  We reviewed the resident's history and exam and pertinent patient test results.  I agree with the assessment, diagnosis, and plan of care documented in the resident's note.

## 2024-06-15 ENCOUNTER — Other Ambulatory Visit (HOSPITAL_COMMUNITY): Payer: Self-pay

## 2024-06-15 ENCOUNTER — Encounter: Payer: Self-pay | Admitting: Student

## 2024-06-18 ENCOUNTER — Other Ambulatory Visit: Payer: Self-pay | Admitting: Student

## 2024-06-18 ENCOUNTER — Other Ambulatory Visit (HOSPITAL_COMMUNITY): Payer: Self-pay

## 2024-06-18 MED ORDER — FREESTYLE LIBRE 3 READER DEVI
3 refills | Status: AC
  Filled 2024-06-18: qty 1, 90d supply, fill #0

## 2024-06-20 ENCOUNTER — Encounter: Admitting: Student

## 2024-06-21 ENCOUNTER — Ambulatory Visit (HOSPITAL_COMMUNITY)
Admission: RE | Admit: 2024-06-21 | Discharge: 2024-06-21 | Disposition: A | Discharge status: Home / Self Care | Source: Ambulatory Visit | Attending: Family Medicine | Admitting: Family Medicine

## 2024-06-21 DIAGNOSIS — E041 Nontoxic single thyroid nodule: Secondary | ICD-10-CM | POA: Insufficient documentation

## 2024-06-25 ENCOUNTER — Other Ambulatory Visit (HOSPITAL_COMMUNITY): Payer: Self-pay

## 2024-06-27 ENCOUNTER — Other Ambulatory Visit (HOSPITAL_COMMUNITY): Payer: Self-pay

## 2024-07-02 NOTE — Progress Notes (Signed)
 Cardiology Office Note   Date:  07/09/2024  ID:  Cindy [name], DOB 1966/04/03, MRN [id number] PCP: Nooruddin, Saad, MD  [facility] Providers Cardiologist:  Soyla DELENA Merck, MD   History of Present Illness Cindy [name] is a 58 y.o. female with a past medical history of CVA, CAD, HTN, HLD, DM2, OSA on CPAP, GERD, and obesity here for follow-up appointment.  Was last seen 07/13/2023.  History includes family history of premature heart disease with father dying at age 24 with an MI.  History also includes suffering a CVA in 2019 and has been maintained on aspirin  and Plavix .  Admitted August 2021 with chest pain.  Troponins were not revealing however her history and risk factors prompted a definitive angiography which resulted in DES x 2 to mid and distal LAD.  Residual disease in the RCA including 60% the proximal vessel and 80% in the distal vessel was treated medically.  LDL was 153 and her hemoglobin A1c was 12.3 at that time.  At follow-up, she reported that she could not afford insulin  and her insurance did not cover her current insulin  prescription.  Seen in the ER March of last year with chest pain and flat cardiac enzymes.  Nuclear stress test showed normal perfusion but evidence of transient ischemic dilation.  She is recommended for further ischemic evaluation with cardiac PET or heart catheterization should she have recurrent chest pain.  Follow-up was arranged where she had significant dyspnea on exertion and was sent for cardiac PET stress test which revealed overall normal perfusion and no evidence of ischemia or infarction but myocardial blood flow was abnormal.  She was referred to angiography.  LHC 6//24 showed nonobstructive CAD and continued patency of the LAD stents with an LVEDP of 13 mmHg.  Symptoms felt to be related to microvascular disease due to poorly controlled DM.  Since her cath she has had 2 ER evaluations, 1 for right-sided numbness with  unremarkable head CT/brain MRI.  Was discharged without admissions.  Was seen by Damien Braver, NP on 05/20/2023.  Was doing well with increased isosorbide  to 60 mg daily.  When she was last seen in the clinic she unfortunately had shingles.  Shingles were likely responsible for her chest pain.  She was recovering.  No cardiac complaints.  Today, she presents with coronary artery disease for preoperative clearance for a colonoscopy.  She is scheduled for a colonoscopy on July 12, 2024. She has coronary artery disease and underwent cardiac catheterization in 2021 with stent placement in the LAD. She is on Plavix , which she will hold for five days prior to the procedure.  She experiences fatigue and shortness of breath when walking one to two blocks, which she attributes to post-stroke weakness. She avoids stairs but can manage a flight with a railing. She performs household chores daily.  She has sleep apnea and uses a CPAP machine. Her diabetes management is ongoing, with an A1c of 13, and she is on Ozempic . She notices ankle swelling in the evenings, improving with leg elevation.  She has a follow-up appointment with her primary care provider on July 16, 2024, for cholesterol level assessment.  Reports no chest pain, pressure, or tightness. No orthopnea, PND. Reports no palpitations.   Discussed the use of AI scribe software for clinical note transcription with the patient, who gave verbal consent to proceed.  She can hold her plavix  for 5 days. Please start ASA 81mg  in its place. Please restart plavix  when medically  safe to do so. At that time you can discontinue the ASA.    ROS: pertinent ROS in HPI  Studies Reviewed      CARDIAC CATHETERIZATION   CARDIAC CATHETERIZATION 05/03/2023   Narrative   Prox RCA-1 lesion is 40% stenosed.   Prox RCA-2 lesion is 40% stenosed.   Dist RCA lesion is 50% stenosed.   Prox LAD to Mid LAD lesion is 20% stenosed.   Prox Cx to Dist Cx lesion is 25%  stenosed.   Previously placed Mid LAD to Dist LAD stent of unknown type is  widely patent.   LV end diastolic pressure is normal.   Nonobstructive CAD. Continued excellent patency of stents in the LAD Normal LVEDP 13 mm Hg   Plan: continued medical therapy. Symptoms may be related to microvascular disease due to poorly controlled DM.   Findings Coronary Findings Diagnostic  Dominance: Right   Left Anterior Descending Prox LAD to Mid LAD lesion is 20% stenosed. The lesion was previously treated using a drug eluting stent over 2 years ago. Previously placed Mid LAD to Dist LAD stent of unknown type is  widely patent.   Left Circumflex Prox Cx to Dist Cx lesion is 25% stenosed.   Right Coronary Artery Prox RCA-1 lesion is 40% stenosed. Prox RCA-2 lesion is 40% stenosed. Dist RCA lesion is 50% stenosed. The lesion is segmental.   Intervention   No interventions have been documented.     CARDIAC CATHETERIZATION   CARDIAC CATHETERIZATION 07/08/2020   Narrative  Dist LAD lesion is 80% stenosed.  A drug-eluting stent was successfully placed using a STENT RESOLUTE ONYX 2.5X18, postdilated with a 3.0 [place] balloon and optimized wth IVUS.  Post intervention, there is a 0% residual stenosis.  Mid LAD lesion is 80% stenosed. Minimal calcification by IVUS so atherectomy was not required.  A drug-eluting stent was successfully placed using a STENT RESOLUTE ONYX 3.5X22, postdilated with a 4.0 [place] balloon and optimized wth IVUS.  Post intervention, there is a 0% residual stenosis.   Continue aggressive secondary prevention.  I stressed the importance of DAPT.   Findings Coronary Findings Diagnostic  Dominance: Co-dominant   Left Anterior Descending Mid LAD lesion is 80% stenosed. The lesion is calcified. Ultrasound (IVUS) was performed. Severe plaque burden was detected. The lesion has a fibro-fatty core and a necrotic core. not much calcium  noted by IVUS. Dist LAD lesion is 80%  stenosed.   Intervention   Mid LAD lesion Stent CATH LAUNCHER 6FR EBU 3.75 guide catheter was inserted. Lesion crossed with guidewire using a WIRE ASAHI PROWATER 180CM. Pre-stent angioplasty was performed using a BALLOON SAPPHIRE 2.5X15. A drug-eluting stent was successfully placed using a STENT RESOLUTE ONYX 3.5X22. Stent strut is well apposed. Post-stent angioplasty was performed using a BALLOON SAPPHIRE [place] 4.0X12. IVUS was used to optimize the stent size, and performed before and after stenting with 5 Fr Opticross catheter.  Initially, there was plan for atherecetomy but given that there was not much calcium , we performed balloon and stenting. Post-Intervention Lesion Assessment The intervention was successful. Pre-interventional TIMI flow is 3. Post-intervention TIMI flow is 3. No complications occurred at this lesion. There is a 0% residual stenosis post intervention.   Dist LAD lesion Stent CATH LAUNCHER 6FR EBU 3.75 guide catheter was inserted. Lesion crossed with guidewire using a WIRE ASAHI PROWATER 180CM. Pre-stent angioplasty was performed using a BALLOON SAPPHIRE 2.5X15. A drug-eluting stent was successfully placed using a STENT RESOLUTE ONYX 2.5X18. Stent strut is  well apposed. Post-stent angioplasty was performed using a BALLOON SAPPHIRE [place] 3.0X12. IVUS did not cross initially.  IVUS was done after stent placement due to haziness at distal edge of stent, but there was no evidence of edge dissection. Post-Intervention Lesion Assessment The intervention was successful. Pre-interventional TIMI flow is 3. Post-intervention TIMI flow is 3. No complications occurred at this lesion. There is a 0% residual stenosis post intervention.   STRESS TESTS   NM PET CT CARDIAC PERFUSION MULTI W/ABSOLUTE BLOODFLOW 04/20/2023   Narrative   Normal perfusion. No increase in LVEF with stress. No TID. MBF abnormal, 1.25. Findings are either related to obstructive CAD or microvascular disease given the  patient's poorly controlled diabetes.   LV perfusion is normal. There is no evidence of ischemia. There is no evidence of infarction.   Rest left ventricular function is normal. Rest EF: 72 %. Stress left ventricular function is normal. Stress EF: 72 %. End diastolic cavity size is normal.   Myocardial blood flow was computed to be 1.25ml/g/min at rest and 1.56ml/g/min at stress. Global myocardial blood flow reserve was 1.25 and was abnormal.   Coronary calcium  assessment not performed due to prior revascularization.   The study is normal. The study is low risk.   Electronically signed by Darryle Decent, MD _____________________________________________________________________________________________________   ERASMO: OVER-READ INTERPRETATION  PET-CT CHEST   The following report is an over-read performed by radiologist Dr. Camellia Lang [facility] on 04/20/2023. This over-read does not include interpretation of cardiac or coronary anatomy or pathology. The cardiac PET and cardiac CT interpretation by the cardiologist is to be attached.   COMPARISON:  None.   FINDINGS: No evidence for lymphadenopathy within the visualized mediastinum or hilar regions.   Patchy ground-glass opacity in the dependent lung bases is probably atelectatic. No pleural effusion.   Visualized portions of the upper abdomen are unremarkable.   No suspicious lytic or sclerotic osseous abnormality.   IMPRESSION: No acute or clinically significant extracardiac findings.     Electronically Signed By: Camellia Candle M.D. On: 04/20/2023 11:28   ECHOCARDIOGRAM   ECHOCARDIOGRAM COMPLETE 07/07/2020   Narrative ECHOCARDIOGRAM REPORT       Patient Name:   Cindy [name] Date of Exam: 07/07/2020 Medical Rec #:  [id number]          Height:       63.0 in Accession #:    [id number]         Weight:       250.2 lb Date of Birth:  1966/06/26         BSA:          2.127 m Patient Age:    53 years            BP:           144/68 mmHg Patient Gender: F                  HR:           70 bpm. Exam Location:  Inpatient   Procedure: 2D Echo   Indications:    chest pain 786.50   History:        Patient has prior history of Echocardiogram examinations, most recent 08/19/2018. Risk Factors:Diabetes, Dyslipidemia and Hypertension.   Sonographer:    Tinnie Barefoot Referring Phys: [id number] CALLIE E GOODRICH   IMPRESSIONS     1. Left ventricular ejection fraction, by estimation, is 60 to 65%. The left ventricle has  normal function. The left ventricle has no regional wall motion abnormalities. There is mild concentric left ventricular hypertrophy. Left ventricular diastolic parameters were normal. 2. Right ventricular systolic function is normal. The right ventricular size is normal. Tricuspid regurgitation signal is inadequate for assessing PA pressure. 3. The mitral valve is grossly normal. Trivial mitral valve regurgitation. No evidence of mitral stenosis. 4. The aortic valve is tricuspid. Aortic valve regurgitation is not visualized. No aortic stenosis is present. 5. The inferior vena cava is normal in size with greater than 50% respiratory variability, suggesting right atrial pressure of 3 mmHg.   FINDINGS Left Ventricle: Left ventricular ejection fraction, by estimation, is 60 to 65%. The left ventricle has normal function. The left ventricle has no regional wall motion abnormalities. The left ventricular internal cavity size was normal in size. There is mild concentric left ventricular hypertrophy. Left ventricular diastolic parameters were normal. Normal left ventricular filling pressure.   Right Ventricle: The right ventricular size is normal. No increase in right ventricular wall thickness. Right ventricular systolic function is normal. Tricuspid regurgitation signal is inadequate for assessing PA pressure.   Left Atrium: Left atrial size was normal in size.   Right Atrium: Right atrial  size was normal in size.   Pericardium: Trivial pericardial effusion is present. Presence of pericardial fat pad.   Mitral Valve: The mitral valve is grossly normal. Trivial mitral valve regurgitation. No evidence of mitral valve stenosis.   Tricuspid Valve: The tricuspid valve is grossly normal. Tricuspid valve regurgitation is trivial. No evidence of tricuspid stenosis.   Aortic Valve: The aortic valve is tricuspid. Aortic valve regurgitation is not visualized. No aortic stenosis is present.   Pulmonic Valve: The pulmonic valve was grossly normal. Pulmonic valve regurgitation is not visualized.   Aorta: The aortic root and ascending aorta are structurally normal, with no evidence of dilitation.   Venous: The inferior vena cava is normal in size with greater than 50% respiratory variability, suggesting right atrial pressure of 3 mmHg.   IAS/Shunts: The atrial septum is grossly normal.     LEFT VENTRICLE PLAX 2D LVIDd:         4.00 cm  Diastology LVIDs:         2.60 cm  LV e' lateral:   7.40 cm/s LV PW:         1.20 cm  LV E/e' lateral: 14.5 LV IVS:        1.20 cm  LV e' medial:    7.29 cm/s LVOT diam:     2.00 cm  LV E/e' medial:  14.7 LV SV:         75 LV SV Index:   35 LVOT Area:     3.14 cm     RIGHT VENTRICLE             IVC RV S prime:     11.30 cm/s  IVC diam: 2.00 cm TAPSE (M-mode): 2.6 cm   LEFT ATRIUM             Index       RIGHT ATRIUM           Index LA diam:        4.00 cm 1.88 cm/m  RA Area:     13.00 cm LA Vol (A2C):   42.6 ml 20.03 ml/m RA Volume:   29.50 ml  13.87 ml/m LA Vol (A4C):   52.7 ml 24.78 ml/m LA Biplane Vol: 50.2 ml 23.60 ml/m AORTIC VALVE  LVOT Vmax:   96.70 cm/s LVOT Vmean:  62.700 cm/s LVOT VTI:    0.240 m   AORTA Ao Root diam: 2.70 cm Ao Asc diam:  3.10 cm   MITRAL VALVE MV Area (PHT): 3.31 cm     SHUNTS MV Decel Time: 229 msec     Systemic VTI:  0.24 m MV E velocity: 107.00 cm/s  Systemic Diam: 2.00 cm MV A velocity: 101.00  cm/s MV E/A ratio:  1.06   Darryle Decent MD Electronically signed by Darryle Decent MD Signature Date/Time: 07/07/2020/3:27:27 PM       Final          Physical Exam VS:  BP 122/66 (BP Location: Left Arm, Patient Position: Sitting, Cuff Size: Large)   Pulse 77   Ht 5' 3 (1.6 m)   Wt 229 lb (103.9 kg)   LMP 08/11/2016   SpO2 97%   BMI 40.57 kg/m        Wt Readings from Last 3 Encounters:  07/09/24 229 lb (103.9 kg)  06/11/24 234 lb (106.1 kg)  06/05/24 232 lb 3.2 oz (105.3 kg)    GEN: Well nourished, well developed in no acute distress NECK: No JVD; No carotid bruits CARDIAC: RRR, no murmurs, rubs, gallops RESPIRATORY:  Clear to auscultation without rales, wheezing or rhonchi  ABDOMEN: Soft, non-tender, non-distended EXTREMITIES:  No edema; No deformity   ASSESSMENT AND PLAN  Preop Evaluation  Ms. Buerkle's perioperative risk of a major cardiac event is 11% according to the Revised Cardiac Risk Index (RCRI).  Therefore, she is at high risk for perioperative complications.   Her functional capacity is fair at 4.73 METs according to the Duke Activity Status Index (DASI). Recommendations: According to ACC/AHA guidelines, no further cardiovascular testing needed.  The patient may proceed to surgery at acceptable risk.   Antiplatelet and/or Anticoagulation Recommendations: Clopidogrel  (Plavix ) can be held for 5 days prior to her surgery and resumed as soon as possible post op. Take ASA 81mg  in its place.  Coronary artery disease with prior LAD stents Coronary artery disease with two stents placed in the LAD in 2021. No current issues with chest pain or shortness of breath. Blood pressure is well-controlled at 122/66 mmHg. She is on isosorbide  mononitrate for vasodilation and chest pain management. - Continue current cardiac medications. - Ensure refills of cardiac medications.  Type 2 diabetes mellitus, uncontrolled Type 2 diabetes mellitus with an elevated A1c of 13. She is  on Ozempic  to improve glycemic control. The primary care provider is monitoring and will recheck A1c at the next appointment.  Obstructive sleep apnea on CPAP Obstructive sleep apnea managed with CPAP. No issues reported with CPAP use.  History of cerebrovascular accident with residual right-sided weakness and exertional dyspnea Cerebrovascular accident in 2019 with residual right-sided weakness and exertional dyspnea. She experiences shortness of breath and leg fatigue when walking one to two blocks, which has been consistent since the stroke. No recent changes in symptoms that would necessitate further cardiac evaluation at this time.  Lower extremity edema Reports of lower extremity edema, particularly in the evenings, which resolves with leg elevation.   Hypertension -well controlled today -continue current medication regimen     Dispo: She can follow-up in 6 months.   Signed, Orren LOISE Fabry, PA-C

## 2024-07-03 ENCOUNTER — Other Ambulatory Visit: Payer: Self-pay | Admitting: Student

## 2024-07-03 ENCOUNTER — Other Ambulatory Visit (HOSPITAL_COMMUNITY): Payer: Self-pay

## 2024-07-03 DIAGNOSIS — E785 Hyperlipidemia, unspecified: Secondary | ICD-10-CM

## 2024-07-03 NOTE — Telephone Encounter (Signed)
 Medication prescribed by a provider not with imc.

## 2024-07-04 ENCOUNTER — Encounter (HOSPITAL_COMMUNITY): Payer: Self-pay

## 2024-07-04 ENCOUNTER — Other Ambulatory Visit (HOSPITAL_COMMUNITY): Payer: Self-pay

## 2024-07-09 ENCOUNTER — Encounter: Payer: Self-pay | Admitting: Physician Assistant

## 2024-07-09 ENCOUNTER — Other Ambulatory Visit (HOSPITAL_COMMUNITY): Payer: Self-pay

## 2024-07-09 ENCOUNTER — Ambulatory Visit: Attending: Physician Assistant | Admitting: Physician Assistant

## 2024-07-09 VITALS — BP 122/66 | HR 77 | Ht 63.0 in | Wt 229.0 lb

## 2024-07-09 DIAGNOSIS — I2510 Atherosclerotic heart disease of native coronary artery without angina pectoris: Secondary | ICD-10-CM

## 2024-07-09 DIAGNOSIS — Z9861 Coronary angioplasty status: Secondary | ICD-10-CM

## 2024-07-09 DIAGNOSIS — E0822 Diabetes mellitus due to underlying condition with diabetic chronic kidney disease: Secondary | ICD-10-CM

## 2024-07-09 DIAGNOSIS — E7800 Pure hypercholesterolemia, unspecified: Secondary | ICD-10-CM

## 2024-07-09 DIAGNOSIS — E785 Hyperlipidemia, unspecified: Secondary | ICD-10-CM | POA: Diagnosis not present

## 2024-07-09 DIAGNOSIS — I10 Essential (primary) hypertension: Secondary | ICD-10-CM

## 2024-07-09 DIAGNOSIS — G4733 Obstructive sleep apnea (adult) (pediatric): Secondary | ICD-10-CM

## 2024-07-09 MED ORDER — LISINOPRIL 40 MG PO TABS
40.0000 mg | ORAL_TABLET | Freq: Every day | ORAL | 3 refills | Status: DC
  Filled 2024-07-09: qty 90, 90d supply, fill #0
  Filled 2024-09-27: qty 90, 90d supply, fill #1

## 2024-07-09 MED ORDER — ISOSORBIDE MONONITRATE ER 30 MG PO TB24
15.0000 mg | ORAL_TABLET | Freq: Every day | ORAL | 3 refills | Status: DC
  Filled 2024-07-09: qty 15, 30d supply, fill #0

## 2024-07-09 MED ORDER — CLOPIDOGREL BISULFATE 75 MG PO TABS
75.0000 mg | ORAL_TABLET | Freq: Every day | ORAL | 3 refills | Status: DC
  Filled 2024-07-09: qty 90, 90d supply, fill #0
  Filled 2024-09-27: qty 90, 90d supply, fill #1

## 2024-07-09 MED ORDER — ATORVASTATIN CALCIUM 80 MG PO TABS
80.0000 mg | ORAL_TABLET | Freq: Every day | ORAL | 3 refills | Status: DC
  Filled 2024-07-09: qty 90, 90d supply, fill #0
  Filled 2024-09-27: qty 90, 90d supply, fill #1

## 2024-07-09 MED ORDER — EZETIMIBE 10 MG PO TABS
10.0000 mg | ORAL_TABLET | Freq: Every day | ORAL | 3 refills | Status: DC
  Filled 2024-07-09: qty 90, 90d supply, fill #0
  Filled 2024-09-27: qty 90, 90d supply, fill #1

## 2024-07-09 NOTE — Patient Instructions (Signed)
 Medication Instructions:  No Changes *If you need a refill on your cardiac medications before your next appointment, please call your pharmacy*  Lab Work: None If you have labs (blood work) drawn today and your tests are completely normal, you will receive your results only by: MyChart Message (if you have MyChart) OR A paper copy in the mail If you have any lab test that is abnormal or we need to change your treatment, we will call you to review the results.  Testing/Procedures: None  Follow-Up: At [facility], you and your health needs are our priority.  As part of our continuing mission to provide you with exceptional heart care, our providers are all part of one team.  This team includes your primary Cardiologist (physician) and Advanced Practice Providers or APPs (Physician Assistants and Nurse Practitioners) who all work together to provide you with the care you need, when you need it.  Your next appointment:   6 month(s)  Provider:   Gayatri A Acharya, MD  Other Instructions None

## 2024-07-09 NOTE — Telephone Encounter (Signed)
 Spoke with patient today and received note today from [facility] that patient has been ok'd to hold Plavix  5 days.   Today is 8/11 and patients procedure is 8/14, I thought maybe we would have to reschedule patient because of the timeframe but the patient has already been off Plavix  since Sat the 9th per patients recollection, she had ran out of her med and didn't start it back and seen [facility] today. She knows not to start it back until the directions after her procedure by Dr Legrand

## 2024-07-11 ENCOUNTER — Other Ambulatory Visit (INDEPENDENT_AMBULATORY_CARE_PROVIDER_SITE_OTHER)

## 2024-07-11 DIAGNOSIS — E118 Type 2 diabetes mellitus with unspecified complications: Secondary | ICD-10-CM

## 2024-07-11 DIAGNOSIS — Z794 Long term (current) use of insulin: Secondary | ICD-10-CM

## 2024-07-11 DIAGNOSIS — D508 Other iron deficiency anemias: Secondary | ICD-10-CM

## 2024-07-11 LAB — BASIC METABOLIC PANEL WITH GFR
BUN: 15 mg/dL (ref 6–23)
CO2: 30 meq/L (ref 19–32)
Calcium: 9.2 mg/dL (ref 8.4–10.5)
Chloride: 104 meq/L (ref 96–112)
Creatinine, Ser: 0.69 mg/dL (ref 0.40–1.20)
GFR: 94.79 mL/min (ref >60.00)
Glucose, Bld: 104 mg/dL — ABNORMAL HIGH (ref 70–99)
Potassium: 3.6 meq/L (ref 3.5–5.1)
Sodium: 141 meq/L (ref 135–145)

## 2024-07-11 LAB — CBC
HCT: 32.2 % — ABNORMAL LOW (ref 36.0–46.0)
Hemoglobin: 10.3 g/dL — ABNORMAL LOW (ref 12.0–15.0)
MCHC: 31.9 g/dL (ref 30.0–36.0)
MCV: 89.3 fl (ref 78.0–100.0)
Platelets: 445 K/uL — ABNORMAL HIGH (ref 150.0–400.0)
RBC: 3.6 Mil/uL — ABNORMAL LOW (ref 3.87–5.11)
RDW: 16 % — ABNORMAL HIGH (ref 11.5–15.5)
WBC: 7.1 K/uL (ref 4.0–10.5)

## 2024-07-12 ENCOUNTER — Other Ambulatory Visit (HOSPITAL_COMMUNITY): Payer: Self-pay

## 2024-07-12 ENCOUNTER — Encounter: Payer: Self-pay | Admitting: Gastroenterology

## 2024-07-12 ENCOUNTER — Ambulatory Visit (AMBULATORY_SURGERY_CENTER): Admitting: Gastroenterology

## 2024-07-12 ENCOUNTER — Ambulatory Visit: Payer: Self-pay | Admitting: Physician Assistant

## 2024-07-12 VITALS — BP 127/78 | HR 85 | Temp 97.3°F | Resp 18 | Ht 63.0 in | Wt 232.0 lb

## 2024-07-12 DIAGNOSIS — D123 Benign neoplasm of transverse colon: Secondary | ICD-10-CM | POA: Diagnosis not present

## 2024-07-12 DIAGNOSIS — K319 Disease of stomach and duodenum, unspecified: Secondary | ICD-10-CM | POA: Diagnosis not present

## 2024-07-12 DIAGNOSIS — K219 Gastro-esophageal reflux disease without esophagitis: Secondary | ICD-10-CM

## 2024-07-12 DIAGNOSIS — R1314 Dysphagia, pharyngoesophageal phase: Secondary | ICD-10-CM

## 2024-07-12 DIAGNOSIS — D508 Other iron deficiency anemias: Secondary | ICD-10-CM

## 2024-07-12 MED ORDER — SODIUM CHLORIDE 0.9 % IV SOLN: 500.0000 mL | Freq: Once | INTRAVENOUS | Status: DC

## 2024-07-12 NOTE — Op Note (Signed)
 [facility] Patient Name: Cindy [name] Procedure Date: 07/12/2024 10:01 AM MRN: [id number] Endoscopist: Victory L. Legrand , MD, [id number] Age: 58 Referring MD:  Date of Birth: 03-Aug-1966 Gender: Female Account #: [id number] Procedure:                Upper GI endoscopy Indications:              Epigastric abdominal pain, Unexplained iron                            deficiency anemia, Pharyngeal phase dysphagia                            (dysphagia to large pills)                           Clinical details in most recent office consult note                           Normal TSH most recent hemoglobin A1c >13 (has been                            referred to endocrinology) Medicines:                Monitored Anesthesia Care Procedure:                Pre-Anesthesia Assessment:                           - Prior to the procedure, a History and Physical                            was performed, and patient medications and                            allergies were reviewed. The patient's tolerance of                            previous anesthesia was also reviewed. The risks                            and benefits of the procedure and the sedation                            options and risks were discussed with the patient.                            All questions were answered, and informed consent                            was obtained. Prior Anticoagulants: The patient has                            taken Plavix  (clopidogrel ), last dose was 5 days  prior to procedure. ASA Grade Assessment: III - A                            patient with severe systemic disease. After                            reviewing the risks and benefits, the patient was                            deemed in satisfactory condition to undergo the                            procedure.                           After obtaining informed consent, the endoscope was                             passed under direct vision. Throughout the                            procedure, the patient's blood pressure, pulse, and                            oxygen saturations were monitored continuously. The                            GIF [id number] #[id number] was introduced through the                            mouth, and advanced to the second part of duodenum.                            The upper GI endoscopy was accomplished without                            difficulty. The patient tolerated the procedure                            well. Scope In: Scope Out: Findings:                 The esophagus was normal. Scope passed easily                            through the UES, the entire esophagus and the EG                            junction. No stricture seen, and note dilatation or                            mucosal abnormalities.                           Patchy mild inflammation characterized by adherent  blood and erythema was found in the gastric body.                            Several biopsies were obtained in the gastric body                            and in the gastric antrum with cold forceps for                            histology.                           The exam of the stomach was otherwise normal,                            including on retroflexion. Stomach distended well                            with insufflation                           Normal mucosa was found in the entire duodenum.                            Biopsies for histology were taken with a cold                            forceps for evaluation of celiac disease. Complications:            No immediate complications. Estimated Blood Loss:     Estimated blood loss was minimal. Impression:               - Normal esophagus.                           - Gastritis.                           - Normal mucosa was found in the entire examined                            duodenum. Biopsied.                            - Several biopsies were obtained in the gastric                            body and in the gastric antrum. Recommendation:           - Patient has a contact number available for                            emergencies. The signs and symptoms of potential                            delayed complications were discussed with the  patient. Return to normal activities tomorrow.                            Written discharge instructions were provided to the                            patient.                           - Resume previous diet.                           - Resume Plavix  (clopidogrel ) at prior dose                            tomorrow.                           - Await pathology results.                           - See the other procedure note for documentation of                            additional recommendations.                           - Follow-up at next available APP clinic appointment                           - Take larger pills and capsules with yogurt or                            applesauce, and use chin tuck maneuver when                            swallowing. Hebah Bogosian L. Legrand, MD 07/12/2024 10:45:58 AM This report has been signed electronically.

## 2024-07-12 NOTE — Progress Notes (Signed)
 1039 Report given to PACU, vss

## 2024-07-12 NOTE — Progress Notes (Signed)
1002 Robinul 0.1 mg IV given due large amount of secretions upon assessment.  MD made aware, vss 

## 2024-07-12 NOTE — Progress Notes (Signed)
 Pt's states no medical or surgical changes since previsit or office visit.   Last dose of Plavix  about a month ago per patient.

## 2024-07-12 NOTE — Op Note (Signed)
 [facility] Patient Name: Cindy [name] Procedure Date: 07/12/2024 10:01 AM MRN: [id number] Endoscopist: Victory L. Legrand , MD, [id number] Age: 58 Referring MD:  Date of Birth: 07-21-1966 Gender: Female Account #: [id number] Procedure:                Colonoscopy Indications:              Unexplained iron deficiency anemia Medicines:                Monitored Anesthesia Care Procedure:                Pre-Anesthesia Assessment:                           - Prior to the procedure, a History and Physical                            was performed, and patient medications and                            allergies were reviewed. The patient's tolerance of                            previous anesthesia was also reviewed. The risks                            and benefits of the procedure and the sedation                            options and risks were discussed with the patient.                            All questions were answered, and informed consent                            was obtained. Prior Anticoagulants: The patient has                            taken Plavix  (clopidogrel ), last dose was 5 days                            prior to procedure. ASA Grade Assessment: III - A                            patient with severe systemic disease. After                            reviewing the risks and benefits, the patient was                            deemed in satisfactory condition to undergo the                            procedure.  After obtaining informed consent, the colonoscope                            was passed under direct vision. Throughout the                            procedure, the patient's blood pressure, pulse, and                            oxygen saturations were monitored continuously. The                            Olympus Scope SN: [id number] was introduced through                            the anus and advanced to the the terminal  ileum,                            with identification of the appendiceal orifice and                            IC valve. The colonoscopy was performed without                            difficulty. The patient tolerated the procedure                            well. The quality of the bowel preparation was                            good. The terminal ileum, ileocecal valve,                            appendiceal orifice, and rectum were photographed. Scope In: 10:13:02 AM Scope Out: 10:26:33 AM Scope Withdrawal Time: 0 hours 9 minutes 54 seconds  Total Procedure Duration: 0 hours 13 minutes 31 seconds  Findings:                 The perianal and digital rectal examinations were                            normal.                           The terminal ileum appeared normal.                           Repeat examination of right colon under NBI                            performed.                           A diminutive polyp was found in the transverse  colon. The polyp was sessile. The polyp was removed                            with a cold snare. Resection and retrieval were                            complete.                           The exam was otherwise without abnormality on                            direct and retroflexion views. Complications:            No immediate complications. Estimated Blood Loss:     Estimated blood loss was minimal. Impression:               - The examined portion of the ileum was normal.                           - One diminutive polyp in the transverse colon,                            removed with a cold snare. Resected and retrieved.                           - The examination was otherwise normal on direct                            and retroflexion views. Recommendation:           - Patient has a contact number available for                            emergencies. The signs and symptoms of potential                             delayed complications were discussed with the                            patient. Return to normal activities tomorrow.                            Written discharge instructions were provided to the                            patient.                           - Resume previous diet.                           - Resume Plavix  (clopidogrel ) at prior dose                            tomorrow.                           -  Await pathology results.                           - Repeat colonoscopy is recommended for                            surveillance. The colonoscopy date will be                            determined after pathology results from today's                            exam become available for review. (note: patient                            had a diminutive TA polyp in Feb 2018)                           - See the other procedure note for documentation of                            additional recommendations. Itamar Mcgowan L. Legrand, MD 07/12/2024 10:30:01 AM This report has been signed electronically.

## 2024-07-12 NOTE — Patient Instructions (Addendum)
 YOU HAD AN ENDOSCOPIC PROCEDURE TODAY AT THE [facility]:   Refer to the procedure report that was given to you for any specific questions about what was found during the examination.  If the procedure report does not answer your questions, please call your gastroenterologist to clarify.  If you requested that your care partner not be given the details of your procedure findings, then the procedure report has been included in a sealed envelope for you to review at your convenience later.  YOU SHOULD EXPECT: Some feelings of bloating in the abdomen. Passage of more gas than usual.  Walking can help get rid of the air that was put into your GI tract during the procedure and reduce the bloating. If you had a lower endoscopy (such as a colonoscopy or flexible sigmoidoscopy) you may notice spotting of blood in your stool or on the toilet paper. If you underwent a bowel prep for your procedure, you may not have a normal bowel movement for a few days.  Please Note:  You might notice some irritation and congestion in your nose or some drainage.  This is from the oxygen used during your procedure.  There is no need for concern and it should clear up in a day or so.  SYMPTOMS TO REPORT IMMEDIATELY:  Following lower endoscopy (colonoscopy or flexible sigmoidoscopy):  Excessive amounts of blood in the stool  Significant tenderness or worsening of abdominal pains  Swelling of the abdomen that is new, acute  Fever of 100F or higher  Following upper endoscopy (EGD)  Vomiting of blood or coffee ground material  New chest pain or pain under the shoulder blades  Painful or persistently difficult swallowing  New shortness of breath  Fever of 100F or higher  Black, tarry-looking stools  Colonoscopy Resume previous diet Resume Plavix  at prior dose tomorrow Await pathology results  Endoscopy Resume previous diet Resume Plavix  at prior dose tomorrow Await pathology results Take larger pills  and capsules with yogurt or applesauce and use chin tuck maneuver when swallowing    For urgent or emergent issues, a gastroenterologist can be reached at any hour by calling [phone]. Do not use MyChart messaging for urgent concerns.    DIET:  We do recommend a small meal at first, but then you may proceed to your regular diet.  Drink plenty of fluids but you should avoid alcoholic beverages for 24 hours.  ACTIVITY:  You should plan to take it easy for the rest of today and you should NOT DRIVE or use heavy machinery until tomorrow (because of the sedation medicines used during the test).    FOLLOW UP: Our staff will call the number listed on your records the next business day following your procedure.  We will call around 7:15- 8:00 am to check on you and address any questions or concerns that you may have regarding the information given to you following your procedure. If we do not reach you, we will leave a message.     If any biopsies were taken you will be contacted by phone or by letter within the next 1-3 weeks.  Please call us  at [phone] if you have not heard about the biopsies in 3 weeks.    SIGNATURES/CONFIDENTIALITY: You and/or your care partner have signed paperwork which will be entered into your electronic medical record.  These signatures attest to the fact that that the information above on your After Visit Summary has been reviewed and is understood.  Full responsibility of the confidentiality of this discharge information lies with you and/or your care-partner.

## 2024-07-12 NOTE — Progress Notes (Signed)
 History and Physical:  This patient presents for endoscopic testing for: Encounter Diagnoses  Name Primary?   Other iron deficiency anemia Yes   Gastroesophageal reflux disease, unspecified whether esophagitis present    Pharyngoesophageal dysphagia     58 year old woman here today for evaluation of multiple digestive issues.  Clinical details are outlined in the comprehensive office consult note of 06/05/2024 with no significant clinical changes since then.  This patient has iron deficiency anemia of unclear cause, pill dysphagia, persistent reflux symptoms setting up with multiple medical comorbidities outlined below, most notably poorly controlled diabetes and obesity as well as coronary artery disease with prior intervention. Plavix  has been held for at least 5 days prior to this procedure. Patient is otherwise without complaints or active issues today.   Past Medical History: Past Medical History:  Diagnosis Date   Anginal chest pain at rest [facility]) 02/17/2023   CAD in native artery 07/09/2020   Coronary artery disease    Diabetes mellitus    Generalized pruritus 05/13/2023   Generalized pruritus 05/13/2023   GERD (gastroesophageal reflux disease)    Hyperlipemia    Hypertension    Precordial chest pain    S/P angioplasty with stent 07/09/2020   Shingles 07/04/2023   Sleep apnea    Stroke [facility])    Unstable angina (HCC) 07/06/2020     Past Surgical History: Past Surgical History:  Procedure Laterality Date   CARDIAC CATHETERIZATION     CORONARY STENT INTERVENTION N/A 07/08/2020   Procedure: CORONARY STENT INTERVENTION;  Surgeon: Dann Candyce RAMAN, MD;  Location: [facility] INVASIVE CV LAB;  Service: Cardiovascular;  Laterality: N/A;   CORONARY ULTRASOUND/IVUS N/A 07/08/2020   Procedure: Intravascular Ultrasound/IVUS;  Surgeon: Dann Candyce RAMAN, MD;  Location: [facility] INVASIVE CV LAB;  Service: Cardiovascular;  Laterality: N/A;   LEFT HEART CATH AND CORONARY ANGIOGRAPHY N/A 07/07/2020    Procedure: LEFT HEART CATH AND CORONARY ANGIOGRAPHY;  Surgeon: Court Dorn PARAS, MD;  Location: [facility] INVASIVE CV LAB;  Service: Cardiovascular;  Laterality: N/A;   LEFT HEART CATH AND CORONARY ANGIOGRAPHY N/A 05/03/2023   Procedure: LEFT HEART CATH AND CORONARY ANGIOGRAPHY;  Surgeon: [place], Peter M, MD;  Location: [facility];  Service: Cardiovascular;  Laterality: N/A;   None      Allergies: No Known Allergies  Outpatient Meds: Current Outpatient Medications  Medication Sig Dispense Refill   Blood Glucose Monitoring Suppl (TRUE METRIX METER) w/Device KIT Use to test blood sugar 2 (two) times daily. 1 kit 0   Continuous Glucose Receiver (FREESTYLE LIBRE 3 READER) DEVI Use to check glucose continuously. 1 each 3   Continuous Glucose Sensor (FREESTYLE LIBRE 3 SENSOR) MISC Place 1 sensor on the skin every 14 days. Use to check glucose continuously 2 each 3   glucose blood test strip Use as instructed to test blood sugar 2 times daily 100 each 12   Insulin  Pen Needle (PEN NEEDLES) 32G X 4 MM MISC Use as directed 100 each 0   Insulin  Pen Needle 32G X 4 MM MISC Use up to 2 (two) times daily as directed 100 each 3   TRUEplus Lancets 28G MISC Use to test blood sugar 2 (two) times daily. 100 each 0   atorvastatin  (LIPITOR ) 80 MG tablet Take 1 tablet (80 mg total) by mouth daily. 90 tablet 3   clopidogrel  (PLAVIX ) 75 MG tablet Take 1 tablet (75 mg total) by mouth daily. 90 tablet 3   cyclobenzaprine  (FLEXERIL ) 10 MG tablet Take 1 tablet (10  mg total) by mouth 2 (two) times daily as needed as needed for muscle spasms. 20 tablet 0   diclofenac  Sodium (VOLTAREN ) 1 % GEL Apply 4 grams topically 4 (four) times daily. 400 g 0   dorzolamide-timolol (COSOPT) 2-0.5 % ophthalmic solution Place 1 drop into both eyes 2 (two) times daily.     ezetimibe  (ZETIA ) 10 MG tablet Take 1 tablet (10 mg total) by mouth daily. 90 tablet 3   Insulin  Glargine (BASAGLAR  KWIKPEN) 100 UNIT/ML Inject 26 Units into the skin  in the morning and at bedtime. 15 mL 4   isosorbide  mononitrate (IMDUR ) 30 MG 24 hr tablet Take 1/2 tablet (15 mg total) by mouth daily. 45 tablet 3   lisinopril  (ZESTRIL ) 40 MG tablet Take 1 tablet (40 mg total) by mouth daily. 90 tablet 3   metFORMIN  (GLUCOPHAGE -XR) 500 MG 24 hr tablet Take 2 tablets (1,000 mg total) by mouth 2 (two) times daily with a meal. 120 tablet 2   metoprolol  succinate (TOPROL -XL) 25 MG 24 hr tablet Take 1 tablet (25 mg total) by mouth daily. 90 tablet 1   nitroGLYCERIN  (NITROSTAT ) 0.4 MG SL tablet Place 1 tablet (0.4 mg total) under the tongue every 5 (five) minutes as needed for chest pain. 25 tablet 4   pantoprazole  (PROTONIX ) 40 MG tablet Take 1 tablet (40 mg total) by mouth daily. 90 tablet 3   Semaglutide ,0.25 or 0.5MG /DOS, 2 MG/3ML SOPN Inject 0.5 mg into the skin once a week. 3 mL 3   sertraline  (ZOLOFT ) 50 MG tablet Take 1 tablet (50 mg total) by mouth at bedtime. 90 tablet 0   valACYclovir  (VALTREX ) 1000 MG tablet Take 1 tablet (1,000 mg total) by mouth 3 (three) times daily. 21 tablet 0   Current Facility-Administered Medications  Medication Dose Route Frequency Provider Last Rate Last Admin   0.9 %  sodium chloride  infusion  500 mL Intravenous Once Danis, Eldon Zietlow L III, MD          ___________________________________________________________________ Objective   Exam:  BP 117/63   Pulse 84   Temp (!) 97.3 F (36.3 C)   Ht 5' 3 (1.6 m)   Wt 232 lb (105.2 kg)   LMP 08/11/2016   SpO2 99%   BMI 41.10 kg/m   CV: regular , S1/S2 Resp: clear to auscultation bilaterally, normal RR and effort noted GI: soft, no tenderness, with active bowel sounds.   Assessment: Encounter Diagnoses  Name Primary?   Other iron deficiency anemia Yes   Gastroesophageal reflux disease, unspecified whether esophagitis present    Pharyngoesophageal dysphagia      Plan: Colonoscopy EGD  The benefits and risks of the planned procedure(s) were described in detail  with the patient or (when appropriate) their health care proxy.  Risks were outlined as including, but not limited to, bleeding, infection, perforation, adverse medication reaction leading to cardiac or pulmonary decompensation, pancreatitis (if ERCP).  The limitation of incomplete mucosal visualization was also discussed.  No guarantees or warranties were given.  The patient is appropriate for an endoscopic procedure in the ambulatory setting.   - Victory Brand, MD

## 2024-07-12 NOTE — Progress Notes (Signed)
 Called to room to assist during endoscopic procedure.  Patient ID and intended procedure confirmed with present staff. Received instructions for my participation in the procedure from the performing physician.

## 2024-07-13 ENCOUNTER — Telehealth: Payer: Self-pay

## 2024-07-13 NOTE — Telephone Encounter (Signed)
  Follow up Call-     07/12/2024    9:09 AM  Call back number  Post procedure Call Back phone  # [phone]  Permission to leave phone message Yes     Patient questions:  Do you have a fever, pain , or abdominal swelling? No. Pain Score  0 *  Have you tolerated food without any problems? Yes.    Have you been able to return to your normal activities? Yes.    Do you have any questions about your discharge instructions: Diet   No. Medications  No. Follow up visit  No.  Do you have questions or concerns about your Care? No.  Actions: * If pain score is 4 or above: No action needed, pain <4.

## 2024-07-16 ENCOUNTER — Ambulatory Visit

## 2024-07-16 ENCOUNTER — Other Ambulatory Visit (HOSPITAL_COMMUNITY): Payer: Self-pay

## 2024-07-16 VITALS — BP 138/69 | HR 83 | Temp 97.5°F | Ht 63.0 in | Wt 224.2 lb

## 2024-07-16 DIAGNOSIS — Z23 Encounter for immunization: Secondary | ICD-10-CM

## 2024-07-16 DIAGNOSIS — E785 Hyperlipidemia, unspecified: Secondary | ICD-10-CM

## 2024-07-16 DIAGNOSIS — I10 Essential (primary) hypertension: Secondary | ICD-10-CM

## 2024-07-16 DIAGNOSIS — Z794 Long term (current) use of insulin: Secondary | ICD-10-CM | POA: Diagnosis not present

## 2024-07-16 DIAGNOSIS — Z7985 Long-term (current) use of injectable non-insulin antidiabetic drugs: Secondary | ICD-10-CM

## 2024-07-16 DIAGNOSIS — E118 Type 2 diabetes mellitus with unspecified complications: Secondary | ICD-10-CM | POA: Diagnosis not present

## 2024-07-16 DIAGNOSIS — Z0000 Encounter for general adult medical examination without abnormal findings: Secondary | ICD-10-CM

## 2024-07-16 LAB — POCT GLYCOSYLATED HEMOGLOBIN (HGB A1C): Hemoglobin A1C: 7.9 % — AB (ref 4.0–5.6)

## 2024-07-16 LAB — GLUCOSE, CAPILLARY: Glucose-Capillary: 80 mg/dL (ref 70–99)

## 2024-07-16 LAB — SURGICAL PATHOLOGY

## 2024-07-16 MED ORDER — SEMAGLUTIDE (1 MG/DOSE) 4 MG/3ML ~~LOC~~ SOPN
1.0000 mg | PEN_INJECTOR | SUBCUTANEOUS | 1 refills | Status: DC
  Filled 2024-07-16 – 2024-07-31 (×2): qty 3, 28d supply, fill #0

## 2024-07-16 NOTE — Patient Instructions (Addendum)
 Please follow the instructions as discussed in today's plan: --Ozempic  dose was changed from 0.5 mg to 1 mg.  Start taking 1 mg of Ozempic . --Continue taking your home insulin  26 units and metformin  1000 mg twice daily as prescribed --Keep a log of your blood pressure readings every day and bring that up during your next visit --Continue taking home Imdur  15mg , Lisinopril  40mg , Metoprolol  25mg  as prescribed --We will check your A1c and cholesterol levels today.  Will call regarding results. --You received pneumonia vaccine today --Please see us  in 1 month  Thank you for seeing us  today!  Sincerely, Dr. Edgardo

## 2024-07-16 NOTE — Assessment & Plan Note (Signed)
-  Patient received pneumonia vaccine today 

## 2024-07-16 NOTE — Progress Notes (Signed)
 CC: Blood pressure follow-up  HPI:  [name] is a 58 y.o. female living with a history stated below and presents today for a blood pressure follow-up. Pt did not have any acute complaints today.   Please see problem based assessment and plan for additional details.      Past Medical History:  Diagnosis Date   Anginal chest pain at rest [facility]) 02/17/2023   CAD in native artery 07/09/2020   Coronary artery disease    Diabetes mellitus    Generalized pruritus 05/13/2023   Generalized pruritus 05/13/2023   GERD (gastroesophageal reflux disease)    Hyperlipemia    Hypertension    Precordial chest pain    S/P angioplasty with stent 07/09/2020   Shingles 07/04/2023   Sleep apnea    Stroke [facility])    Unstable angina (HCC) 07/06/2020    Current Outpatient Medications on File Prior to Visit  Medication Sig Dispense Refill   atorvastatin  (LIPITOR ) 80 MG tablet Take 1 tablet (80 mg total) by mouth daily. 90 tablet 3   Blood Glucose Monitoring Suppl (TRUE METRIX METER) w/Device KIT Use to test blood sugar 2 (two) times daily. 1 kit 0   clopidogrel  (PLAVIX ) 75 MG tablet Take 1 tablet (75 mg total) by mouth daily. 90 tablet 3   Continuous Glucose Receiver (FREESTYLE LIBRE 3 READER) DEVI Use to check glucose continuously. 1 each 3   Continuous Glucose Sensor (FREESTYLE LIBRE 3 SENSOR) MISC Place 1 sensor on the skin every 14 days. Use to check glucose continuously 2 each 3   cyclobenzaprine  (FLEXERIL ) 10 MG tablet Take 1 tablet (10 mg total) by mouth 2 (two) times daily as needed as needed for muscle spasms. 20 tablet 0   diclofenac  Sodium (VOLTAREN ) 1 % GEL Apply 4 grams topically 4 (four) times daily. 400 g 0   dorzolamide-timolol (COSOPT) 2-0.5 % ophthalmic solution Place 1 drop into both eyes 2 (two) times daily.     ezetimibe  (ZETIA ) 10 MG tablet Take 1 tablet (10 mg total) by mouth daily. 90 tablet 3   glucose blood test strip Use as instructed to test blood sugar 2  times daily 100 each 12   Insulin  Glargine (BASAGLAR  KWIKPEN) 100 UNIT/ML Inject 26 Units into the skin in the morning and at bedtime. 15 mL 4   Insulin  Pen Needle (PEN NEEDLES) 32G X 4 MM MISC Use as directed 100 each 0   Insulin  Pen Needle 32G X 4 MM MISC Use up to 2 (two) times daily as directed 100 each 3   isosorbide  mononitrate (IMDUR ) 30 MG 24 hr tablet Take 1/2 tablet (15 mg total) by mouth daily. 45 tablet 3   lisinopril  (ZESTRIL ) 40 MG tablet Take 1 tablet (40 mg total) by mouth daily. 90 tablet 3   metFORMIN  (GLUCOPHAGE -XR) 500 MG 24 hr tablet Take 2 tablets (1,000 mg total) by mouth 2 (two) times daily with a meal. 120 tablet 2   metoprolol  succinate (TOPROL -XL) 25 MG 24 hr tablet Take 1 tablet (25 mg total) by mouth daily. 90 tablet 1   nitroGLYCERIN  (NITROSTAT ) 0.4 MG SL tablet Place 1 tablet (0.4 mg total) under the tongue every 5 (five) minutes as needed for chest pain. 25 tablet 4   pantoprazole  (PROTONIX ) 40 MG tablet Take 1 tablet (40 mg total) by mouth daily. 90 tablet 3   Semaglutide ,0.25 or 0.5MG /DOS, 2 MG/3ML SOPN Inject 0.5 mg into the skin once a week. 3 mL 3   sertraline  (ZOLOFT )  50 MG tablet Take 1 tablet (50 mg total) by mouth at bedtime. 90 tablet 0   TRUEplus Lancets 28G MISC Use to test blood sugar 2 (two) times daily. 100 each 0   valACYclovir  (VALTREX ) 1000 MG tablet Take 1 tablet (1,000 mg total) by mouth 3 (three) times daily. 21 tablet 0   No current facility-administered medications on file prior to visit.    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  Tobacco Use   Smoking status: Never   Smokeless tobacco: Never  Vaping Use   Vaping status: Never Used  Substance and Sexual Activity   Alcohol use: No   Drug use: No   Sexual activity: Not Currently    Birth control/protection: None  Other Topics Concern   Not on file  Social History Narrative   Not on file   Social Drivers of Health   Financial Resource Strain: Not on file  Food Insecurity: Not on file  Transportation Needs: Not on file  Physical Activity: Not on file  Stress: Not on file  Social Connections: Not on file  Intimate Partner Violence: Not on file    Review of Systems: ROS  All pertinent review of systems in history, assessment, plan Vitals:   07/16/24 0934  BP: 138/69  Pulse: 83  Temp: (!) 97.5 F (36.4 C)  TempSrc: Oral  SpO2: 96%  Weight: 224 lb 3.2 oz (101.7 kg)  Height: 5' 3 (1.6 m)    Physical Exam: Physical Exam Constitutional:      Appearance: She is obese.  Cardiovascular:     Rate and Rhythm: Normal rate and regular rhythm.     Heart sounds: Normal heart sounds.  Pulmonary:     Effort: Pulmonary effort is normal.     Breath sounds: Normal breath sounds. No wheezing.  Musculoskeletal:     Right lower leg: No edema.     Left lower leg: No edema.  Neurological:     Mental Status: She is alert.      Assessment & Plan:     Patient seen with Dr. Karna  Assessment & Plan Type 2 diabetes mellitus with complication, with long-term current use of insulin  [facility]) Patient does not endorse any gastric symptoms.  She said she tolerated Ozempic  0.5 mg well.  A1c was 13.3 on 04/19/2024.  Will recheck A1c today.  Patient to benefit from increased dose of Ozempic  for weight loss and blood sugar control.  Will increase Ozempic  from 0.5 to 1 mg --Ozempic  dose changed from 0.5 to 1  mg --Continue taking home insulin  26 units and metformin  1000 mg twice daily --Will get A1c today Hyperlipidemia, unspecified hyperlipidemia type On atorvastatin  80 mg daily.  Lipid panel has not  been checked for more than 1 year --Will get lipid panel today Healthcare maintenance Patient received pneumonia vaccine today Benign essential HTN Patient's hydrochlorothiazide  12.5 mg was stopped during last visit due to low diastolic readings.  Her blood pressure today was 138/60.  Asked patient to keep a log of her blood pressure readings every day to see a trend.  Will follow-up in 1 month. --Continue taking home Imdur  15mg , Lisinopril  40mg , Metoprolol  25mg  as prescribed -- Keep a log of blood pressure readings for next visit --Follow-up in 1 month    Orders Placed This Encounter  Procedures   Pneumococcal conjugate vaccine 20-valent (PCV20)   Lipid Profile   POC Hbg A1C     Rebecka Pion, D.O. [facility] Medicine, PGY-1 Date 07/16/2024 Time 10:18 AM

## 2024-07-16 NOTE — Assessment & Plan Note (Signed)
 On atorvastatin  80 mg daily.  Lipid panel has not been checked for more than 1 year --Will get lipid panel today

## 2024-07-16 NOTE — Assessment & Plan Note (Signed)
 Patient's hydrochlorothiazide  12.5 mg was stopped during last visit due to low diastolic readings.  Her blood pressure today was 138/60.  Asked patient to keep a log of her blood pressure readings every day to see a trend.  Will follow-up in 1 month. --Continue taking home Imdur  15mg , Lisinopril  40mg , Metoprolol  25mg  as prescribed -- Keep a log of blood pressure readings for next visit --Follow-up in 1 month

## 2024-07-16 NOTE — Assessment & Plan Note (Signed)
 Patient does not endorse any gastric symptoms.  She said she tolerated Ozempic  0.5 mg well.  A1c was 13.3 on 04/19/2024.  Will recheck A1c today.  Patient to benefit from increased dose of Ozempic  for weight loss and blood sugar control.  Will increase Ozempic  from 0.5 to 1 mg --Ozempic  dose changed from 0.5 to 1 mg --Continue taking home insulin  26 units and metformin  1000 mg twice daily --Will get A1c today

## 2024-07-17 ENCOUNTER — Ambulatory Visit: Payer: Self-pay | Admitting: Gastroenterology

## 2024-07-17 ENCOUNTER — Ambulatory Visit: Payer: Self-pay

## 2024-07-17 LAB — LIPID PANEL
Chol/HDL Ratio: 3.8 ratio (ref 0.0–4.4)
Cholesterol, Total: 148 mg/dL (ref 100–199)
HDL: 39 mg/dL — ABNORMAL LOW (ref >39)
LDL Chol Calc (NIH): 96 mg/dL (ref 0–99)
Triglycerides: 65 mg/dL (ref 0–149)
VLDL Cholesterol Cal: 13 mg/dL (ref 5–40)

## 2024-07-17 NOTE — Telephone Encounter (Signed)
 Called back the pt again and told her that her LDL levels need to be below 70 due to her previous hx of CVA. Discussed with Dr. Karna that pt needs to f/u with her cardiology PA Ms. Orren Fabry, who has been seeing her. Asked pt to follow up with Tessa during next visit regarding this--pt confirmed she takes atorvastatin  80 mg and ezetimibe  10mg --both high doses.  Left inbox message for Ms. Orren Fabry about it as well and asked her to address pt's LDL levels and HLD meds during her next visit.

## 2024-07-19 NOTE — Progress Notes (Signed)
 Internal Medicine Clinic Attending  I was physically present during the key portions of the resident provided service and participated in the medical decision making of patient's management care. I reviewed pertinent patient test results.  The assessment, diagnosis, and plan were formulated together and I agree with the documentation in the resident's note. A1c improved to 7.9%, reviewed this with the patient. She is monitoring home BG, fasting 90-180s. No hypoglycemic events. Continue semaglutide  titration. Hydrochlorothiazide  stopped d/t low diastolic pressures AND symptoms of dizziness, continue current regimen for now and return with home readings. LDL remains above goal for secondary prevention on maximum statin and ezetimibe . Dr. Edgardo has reached out to Ms. Broman' cardiology team to discuss referral to HLD clinic.  Karna Fellows, MD

## 2024-07-25 ENCOUNTER — Other Ambulatory Visit (HOSPITAL_COMMUNITY): Payer: Self-pay

## 2024-07-26 ENCOUNTER — Other Ambulatory Visit (HOSPITAL_COMMUNITY): Payer: Self-pay

## 2024-07-31 ENCOUNTER — Other Ambulatory Visit (HOSPITAL_COMMUNITY): Payer: Self-pay

## 2024-08-16 ENCOUNTER — Other Ambulatory Visit (HOSPITAL_COMMUNITY): Payer: Self-pay

## 2024-08-16 ENCOUNTER — Ambulatory Visit: Admitting: Student

## 2024-08-16 VITALS — BP 121/83 | HR 83 | Temp 97.7°F | Ht 63.0 in | Wt 223.2 lb

## 2024-08-16 DIAGNOSIS — Z955 Presence of coronary angioplasty implant and graft: Secondary | ICD-10-CM

## 2024-08-16 DIAGNOSIS — Z79899 Other long term (current) drug therapy: Secondary | ICD-10-CM | POA: Diagnosis not present

## 2024-08-16 DIAGNOSIS — Z8249 Family history of ischemic heart disease and other diseases of the circulatory system: Secondary | ICD-10-CM | POA: Diagnosis not present

## 2024-08-16 DIAGNOSIS — I10 Essential (primary) hypertension: Secondary | ICD-10-CM

## 2024-08-16 MED ORDER — OZEMPIC (2 MG/DOSE) 8 MG/3ML ~~LOC~~ SOPN
2.0000 mg | PEN_INJECTOR | SUBCUTANEOUS | 3 refills | Status: AC
  Filled 2024-08-16: qty 3, 28d supply, fill #0
  Filled 2024-09-27: qty 3, 28d supply, fill #1
  Filled 2024-10-25: qty 3, 28d supply, fill #2
  Filled 2024-11-30: qty 3, 28d supply, fill #3

## 2024-08-16 NOTE — Assessment & Plan Note (Signed)
 Patient was seen 4 weeks prior for HTN where she reported dizziness and had low diastolic readings. At that visit, hydrochlorothiazide  was discontinued. Today, her BP is 121/83 and she no longer has dizziness since stopping the hydrochlorothiazide .  Plan: -Continue OFF the hydrochlorothiazide   -Continue other HTN regimen of Imdur  15 mg, lisinopril  40 mg, and Metoprolol  25 mg daily

## 2024-08-16 NOTE — Patient Instructions (Signed)
 Thank you, [name] for allowing us  to provide your care today. Today we discussed blood pressure and ozmepic dose.    For your blood pressure: I am glad you are feeling better, please continue to STAY OFF the hydrochlorothiazide    For the Ozmepic: I have ordered the next dose for you too pick up! After you take the 4th injection of 1mg  of Ozempic , the following week you may use 2 mg of ozempic . You may have nausea and constipation with the dose increase, let us  know if you do, but this should get better with time.   Referrals ordered today:   Referral Orders  No referral(s) requested today     I have ordered the following medication/changed the following medications:   Stop the following medications: Medications Discontinued During This Encounter  Medication Reason   Semaglutide , 1 MG/DOSE, 4 MG/3ML SOPN      Start the following medications: Meds ordered this encounter  Medications   Semaglutide , 2 MG/DOSE, (OZEMPIC , 2 MG/DOSE,) 8 MG/3ML SOPN    Sig: Inject 2 mg into the skin once a week.    Dispense:  3 mL    Refill:  3     Follow up: 3 months   Should you have any questions or concerns please call the internal medicine clinic at [phone].     Please note that our late policy has changed.  If you are more than 15 minutes late to your appointment, you may be asked to reschedule your appointment.  Dr. Kandis, D.O. [facility]

## 2024-08-16 NOTE — Progress Notes (Signed)
 Established Patient Office Visit  Subjective   Patient ID: Cindy [name], female    DOB: Jul 02, 1966  Age: 58 y.o. MRN: [id number]  Chief Complaint  Patient presents with   Follow-up    Routine office visit for follow up of  dm and blood pressure .    Cindy [name] is a 58 y.o. who presents to the clinic for a four week follow up of HTN. Please see problem based assessment and plan for additional details.   Patient Active Problem List   Diagnosis Date Noted   Acute right-sided low back pain with right-sided sciatica 05/22/2024   Thyroid  nodule 05/22/2024   Acute pain of right thigh 02/02/2024   ASCUS of cervix with negative high risk HPV 01/13/2024   Healthcare maintenance 03/02/2023   Dependent edema 03/02/2023   CAD in native artery 07/09/2020   S/P angioplasty with stent 07/08/20 DES to dLAD and DES to mLAD  07/09/2020   History of CVA (cerebrovascular accident) 08/19/2018   Obesity, Class III, BMI 40-49.9 (morbid obesity) 07/01/2010   Type 2 diabetes mellitus with complication, with long-term current use of insulin  (HCC) 05/08/2008   HLD (hyperlipidemia) 05/08/2008   Benign essential HTN 05/08/2008      Objective:     BP 121/83 (BP Location: Left Arm, Patient Position: Sitting, Cuff Size: Normal)   Pulse 83   Temp 97.7 F (36.5 C) (Oral)   Ht 5' 3 (1.6 m)   Wt 223 lb 3.2 oz (101.2 kg)   LMP 08/11/2016   SpO2 98%   BMI 39.54 kg/m  BP Readings from Last 3 Encounters:  08/16/24 121/83  07/16/24 138/69  07/12/24 127/78   Wt Readings from Last 3 Encounters:  08/16/24 223 lb 3.2 oz (101.2 kg)  07/16/24 224 lb 3.2 oz (101.7 kg)  07/12/24 232 lb (105.2 kg)      Physical Exam Vitals reviewed.  Constitutional:      General: She is not in acute distress.    Appearance: She is not ill-appearing, toxic-appearing or diaphoretic.  Cardiovascular:     Rate and Rhythm: Normal rate and regular rhythm.  Pulmonary:     Effort: Pulmonary effort is normal.  No respiratory distress.     Breath sounds: Normal breath sounds. No wheezing.  Musculoskeletal:     Right lower leg: No edema.     Left lower leg: No edema.  Skin:    General: Skin is warm and dry.  Neurological:     Mental Status: She is alert.  Psychiatric:        Mood and Affect: Mood and affect normal.     Last metabolic panel Lab Results  Component Value Date   GLUCOSE 104 (H) 07/11/2024   NA 141 07/11/2024   K 3.6 07/11/2024   CL 104 07/11/2024   CO2 30 07/11/2024   BUN 15 07/11/2024   CREATININE 0.69 07/11/2024   GFR 94.79 07/11/2024   CALCIUM  9.2 07/11/2024   PROT 7.6 06/05/2024   ALBUMIN 3.6 06/05/2024   BILITOT 0.2 06/05/2024   ALKPHOS 65 06/05/2024   AST 12 06/05/2024   ALT 9 06/05/2024   ANIONGAP 11 07/01/2023   Last lipids Lab Results  Component Value Date   CHOL 148 07/16/2024   HDL 39 (L) 07/16/2024   LDLCALC 96 07/16/2024   TRIG 65 07/16/2024   CHOLHDL 3.8 07/16/2024   Last hemoglobin A1c Lab Results  Component Value Date   HGBA1C 7.9 (A) 07/16/2024  The ASCVD Risk score (Arnett DK, et al., 2019) failed to calculate for the following reasons:   Risk score cannot be calculated because patient has a medical history suggesting prior/existing ASCVD    Assessment & Plan:   Problem List Items Addressed This Visit       Cardiovascular and Mediastinum   Benign essential HTN - Primary   Patient was seen 4 weeks prior for HTN where she reported dizziness and had low diastolic readings. At that visit, hydrochlorothiazide  was discontinued. Today, her BP is 121/83 and she no longer has dizziness since stopping the hydrochlorothiazide .  Plan: -Continue OFF the hydrochlorothiazide   -Continue other HTN regimen of Imdur  15 mg, lisinopril  40 mg, and Metoprolol  25 mg daily       Return in about 3 months (around 11/15/2024) for DM, HTN.    Damien Lease, DO

## 2024-08-17 NOTE — Progress Notes (Signed)
 Internal Medicine Clinic Attending  Case discussed with the resident at the time of the visit.  We reviewed the resident's history and exam and pertinent patient test results.  I agree with the assessment, diagnosis, and plan of care documented in the resident's note.

## 2024-08-30 ENCOUNTER — Ambulatory Visit: Admitting: Internal Medicine

## 2024-09-03 ENCOUNTER — Other Ambulatory Visit (HOSPITAL_COMMUNITY): Payer: Self-pay

## 2024-09-10 ENCOUNTER — Other Ambulatory Visit

## 2024-09-10 ENCOUNTER — Ambulatory Visit (INDEPENDENT_AMBULATORY_CARE_PROVIDER_SITE_OTHER): Admitting: Gastroenterology

## 2024-09-10 ENCOUNTER — Encounter: Payer: Self-pay | Admitting: Gastroenterology

## 2024-09-10 VITALS — BP 110/68 | HR 86 | Ht 63.0 in | Wt 220.4 lb

## 2024-09-10 DIAGNOSIS — D509 Iron deficiency anemia, unspecified: Secondary | ICD-10-CM | POA: Diagnosis not present

## 2024-09-10 DIAGNOSIS — Z860101 Personal history of adenomatous and serrated colon polyps: Secondary | ICD-10-CM

## 2024-09-10 DIAGNOSIS — R1013 Epigastric pain: Secondary | ICD-10-CM

## 2024-09-10 DIAGNOSIS — R1010 Upper abdominal pain, unspecified: Secondary | ICD-10-CM | POA: Diagnosis not present

## 2024-09-10 DIAGNOSIS — D508 Other iron deficiency anemias: Secondary | ICD-10-CM

## 2024-09-10 DIAGNOSIS — D500 Iron deficiency anemia secondary to blood loss (chronic): Secondary | ICD-10-CM

## 2024-09-10 LAB — CBC WITH DIFFERENTIAL/PLATELET
Basophils Absolute: 0 K/uL (ref 0.0–0.1)
Basophils Relative: 0.2 % (ref 0.0–3.0)
Eosinophils Absolute: 0.1 K/uL (ref 0.0–0.7)
Eosinophils Relative: 1 % (ref 0.0–5.0)
HCT: 26.5 % — ABNORMAL LOW (ref 36.0–46.0)
Hemoglobin: 8.4 g/dL — ABNORMAL LOW (ref 12.0–15.0)
Lymphocytes Relative: 31 % (ref 12.0–46.0)
Lymphs Abs: 2.2 K/uL (ref 0.7–4.0)
MCHC: 31.6 g/dL (ref 30.0–36.0)
MCV: 89.3 fl (ref 78.0–100.0)
Monocytes Absolute: 0.6 K/uL (ref 0.1–1.0)
Monocytes Relative: 8.5 % (ref 3.0–12.0)
Neutro Abs: 4.2 K/uL (ref 1.4–7.7)
Neutrophils Relative %: 59.3 % (ref 43.0–77.0)
Platelets: 420 K/uL — ABNORMAL HIGH (ref 150.0–400.0)
RBC: 2.96 Mil/uL — ABNORMAL LOW (ref 3.87–5.11)
RDW: 14.7 % (ref 11.5–15.5)
WBC: 7.1 K/uL (ref 4.0–10.5)

## 2024-09-10 LAB — IBC + FERRITIN
Ferritin: 3.8 ng/mL — ABNORMAL LOW (ref 10.0–291.0)
Iron: 40 ug/dL — ABNORMAL LOW (ref 42–145)
Saturation Ratios: 10.4 % — ABNORMAL LOW (ref 20.0–50.0)
TIBC: 385 ug/dL (ref 250.0–450.0)
Transferrin: 275 mg/dL (ref 212.0–360.0)

## 2024-09-10 NOTE — Progress Notes (Signed)
 [facility] Progress Note  Chief Complaint: Abdominal pain, iron deficiency anemia  Subjective  Prior history  Clinic consult July 2025 for epigastric pain, nausea, pill dysphagia, iron deficiency anemia and history of colon polyps August 2025: EGD with mild nonspecific gastritis, H. pylori negative.  No retained food in stomach.  Duodenal biopsies negative for sprue.  Esophagus normal without stricture, no dilation performed.    Hemoglobin A1c noted to be over 13 in May 2025 Colonoscopy to terminal ileum without ileitis/colitis.  Subcentimeter tubular adenoma removed, 7-year recall recommended.  Patient started on oral iron afterward.  CTA chest abdomen pelvis August 2024 with questionable gallbladder wall thickening as well as some calcifications reported by radiology to possibly be gallstones.  Discussed the use of AI scribe software for clinical note transcription with the patient, who gave verbal consent to proceed.  History of Present Illness  Cindy [name] was here with her daughter today and they are glad to report she has been feeling better since I last saw her.  Not currently having upper abdominal pain or nausea.  Having some orthopedic pain from the hip down the left leg lately. Bowel habits are regular without rectal bleeding. We reviewed the findings of her endoscopic procedures and their significance.  ROS: Cardiovascular:  no chest pain Respiratory: no dyspnea  The patient's Past Medical, Family and Social History were reviewed and are on file in the EMR. Past Medical History:  Diagnosis Date   Anginal chest pain at rest 02/17/2023   CAD in native artery 07/09/2020   Coronary artery disease    Diabetes mellitus    Generalized pruritus 05/13/2023   Generalized pruritus 05/13/2023   GERD (gastroesophageal reflux disease)    Hyperlipemia    Hypertension    Precordial chest pain    S/P angioplasty with stent 07/09/2020   Shingles 07/04/2023   Sleep apnea     Stroke [facility])    Unstable angina (HCC) 07/06/2020    Past Surgical History:  Procedure Laterality Date   CARDIAC CATHETERIZATION     CORONARY STENT INTERVENTION N/A 07/08/2020   Procedure: CORONARY STENT INTERVENTION;  Surgeon: Dann Candyce RAMAN, MD;  Location: [facility] INVASIVE CV LAB;  Service: Cardiovascular;  Laterality: N/A;   CORONARY ULTRASOUND/IVUS N/A 07/08/2020   Procedure: Intravascular Ultrasound/IVUS;  Surgeon: Dann Candyce RAMAN, MD;  Location: [facility] INVASIVE CV LAB;  Service: Cardiovascular;  Laterality: N/A;   LEFT HEART CATH AND CORONARY ANGIOGRAPHY N/A 07/07/2020   Procedure: LEFT HEART CATH AND CORONARY ANGIOGRAPHY;  Surgeon: Court Dorn PARAS, MD;  Location: [facility] INVASIVE CV LAB;  Service: Cardiovascular;  Laterality: N/A;   LEFT HEART CATH AND CORONARY ANGIOGRAPHY N/A 05/03/2023   Procedure: LEFT HEART CATH AND CORONARY ANGIOGRAPHY;  Surgeon: [place], Peter M, MD;  Location: [facility] INVASIVE CV LAB;  Service: Cardiovascular;  Laterality: N/A;   None       Objective:  Med list reviewed  Current Outpatient Medications:    atorvastatin  (LIPITOR ) 80 MG tablet, Take 1 tablet (80 mg total) by mouth daily., Disp: 90 tablet, Rfl: 3   Blood Glucose Monitoring Suppl (TRUE METRIX METER) w/Device KIT, Use to test blood sugar 2 (two) times daily., Disp: 1 kit, Rfl: 0   clopidogrel  (PLAVIX ) 75 MG tablet, Take 1 tablet (75 mg total) by mouth daily., Disp: 90 tablet, Rfl: 3   Continuous Glucose Receiver (FREESTYLE LIBRE 3 READER) DEVI, Use to check glucose continuously., Disp: 1 each, Rfl: 3   Continuous Glucose Sensor (FREESTYLE LIBRE 3  SENSOR) MISC, Place 1 sensor on the skin every 14 days. Use to check glucose continuously, Disp: 2 each, Rfl: 3   cyclobenzaprine  (FLEXERIL ) 10 MG tablet, Take 1 tablet (10 mg total) by mouth 2 (two) times daily as needed as needed for muscle spasms., Disp: 20 tablet, Rfl: 0   diclofenac  Sodium (VOLTAREN ) 1 % GEL, Apply 4 grams topically 4 (four) times daily., Disp:  400 g, Rfl: 0   dorzolamide-timolol (COSOPT) 2-0.5 % ophthalmic solution, Place 1 drop into both eyes 2 (two) times daily., Disp: , Rfl:    ezetimibe  (ZETIA ) 10 MG tablet, Take 1 tablet (10 mg total) by mouth daily., Disp: 90 tablet, Rfl: 3   glucose blood test strip, Use as instructed to test blood sugar 2 times daily, Disp: 100 each, Rfl: 12   Insulin  Glargine (BASAGLAR  KWIKPEN) 100 UNIT/ML, Inject 26 Units into the skin in the morning and at bedtime., Disp: 15 mL, Rfl: 4   Insulin  Pen Needle (PEN NEEDLES) 32G X 4 MM MISC, Use as directed, Disp: 100 each, Rfl: 0   Insulin  Pen Needle 32G X 4 MM MISC, Use up to 2 (two) times daily as directed, Disp: 100 each, Rfl: 3   isosorbide  mononitrate (IMDUR ) 30 MG 24 hr tablet, Take 1/2 tablet (15 mg total) by mouth daily., Disp: 45 tablet, Rfl: 3   lisinopril  (ZESTRIL ) 40 MG tablet, Take 1 tablet (40 mg total) by mouth daily., Disp: 90 tablet, Rfl: 3   metFORMIN  (GLUCOPHAGE -XR) 500 MG 24 hr tablet, Take 2 tablets (1,000 mg total) by mouth 2 (two) times daily with a meal., Disp: 120 tablet, Rfl: 2   metoprolol  succinate (TOPROL -XL) 25 MG 24 hr tablet, Take 1 tablet (25 mg total) by mouth daily., Disp: 90 tablet, Rfl: 1   nitroGLYCERIN  (NITROSTAT ) 0.4 MG SL tablet, Place 1 tablet (0.4 mg total) under the tongue every 5 (five) minutes as needed for chest pain., Disp: 25 tablet, Rfl: 4   pantoprazole  (PROTONIX ) 40 MG tablet, Take 1 tablet (40 mg total) by mouth daily., Disp: 90 tablet, Rfl: 3   Semaglutide , 2 MG/DOSE, (OZEMPIC , 2 MG/DOSE,) 8 MG/3ML SOPN, Inject 2 mg into the skin once a week., Disp: 3 mL, Rfl: 3   sertraline  (ZOLOFT ) 50 MG tablet, Take 1 tablet (50 mg total) by mouth at bedtime., Disp: 90 tablet, Rfl: 0   TRUEplus Lancets 28G MISC, Use to test blood sugar 2 (two) times daily., Disp: 100 each, Rfl: 0   valACYclovir  (VALTREX ) 1000 MG tablet, Take 1 tablet (1,000 mg total) by mouth 3 (three) times daily., Disp: 21 tablet, Rfl: 0   Vital signs in  last 24 hrs: Vitals:   09/10/24 1458  BP: 110/68  Pulse: 86   Wt Readings from Last 3 Encounters:  09/10/24 220 lb 6 oz (100 kg)  08/16/24 223 lb 3.2 oz (101.2 kg)  07/16/24 224 lb 3.2 oz (101.7 kg)    Physical Exam  Well-appearing Cardiac: Regular without appreciable murmur,  no peripheral edema Pulm: clear to auscultation bilaterally, normal RR and effort noted Abdomen: soft, no tenderness, with active bowel sounds. No guarding or palpable hepatosplenomegaly.   Labs:  Hemoglobin A1c down to 8.9 in August 2025   ___________________________________________ Radiologic studies: CLINICAL DATA:  Aneurysm suspected.  Shortness of breath.   EXAM: CT ANGIOGRAPHY CHEST, ABDOMEN AND PELVIS   TECHNIQUE: Non-contrast CT of the chest was initially obtained.   Multidetector CT imaging through the chest, abdomen and pelvis was performed using the  standard protocol during bolus administration of intravenous contrast. Multiplanar reconstructed images and MIPs were obtained and reviewed to evaluate the vascular anatomy.   RADIATION DOSE REDUCTION: This exam was performed according to the departmental dose-optimization program which includes automated exposure control, adjustment of the mA and/or kV according to patient size and/or use of iterative reconstruction technique.   CONTRAST:  OMNIPAQUE  IOHEXOL  350 MG/ML SOLN   COMPARISON:  None Available.   FINDINGS: CTA CHEST FINDINGS   Cardiovascular: Preferential opacification of the thoracic aorta. No evidence of thoracic aortic aneurysm or dissection. There is bovine arch anatomy. Normal heart size. No pericardial effusion.   Mediastinum/Nodes: No enlarged mediastinal, hilar, or axillary lymph nodes. Thyroid  gland, trachea, and esophagus demonstrate no significant findings.   Lungs/Pleura: There are minimal patchy ground-glass opacities in the left upper lobe, likely infectious/inflammatory. There is no  pleural effusion or pneumothorax. There is a 3 mm right upper lobe nodule image 5/49.   Musculoskeletal: No chest wall abnormality. No acute or significant osseous findings.   Review of the MIP images confirms the above findings.   CTA ABDOMEN AND PELVIS FINDINGS   VASCULAR   Aorta: Normal caliber aorta without aneurysm, dissection, vasculitis or significant stenosis. There is mild calcified atherosclerotic disease.   Celiac: Patent without evidence of aneurysm, dissection, vasculitis or significant stenosis.   SMA: Common origin with celiac artery. No evidence for aneurysm or dissection. No thrombosis.   Renals: Both renal arteries are patent without evidence of aneurysm, dissection, vasculitis, fibromuscular dysplasia or significant stenosis.   IMA: There is mild stenosis of the origin of the inferior mesenteric artery. The artery is otherwise patent.   Inflow: Patent without evidence of aneurysm, dissection, vasculitis or significant stenosis.   Veins: No obvious venous abnormality within the limitations of this arterial phase study.   Review of the MIP images confirms the above findings.   NON-VASCULAR   Hepatobiliary: There is diffuse gallbladder wall thickening with some calcifications, possibly gallstones. No surrounding inflammation or biliary ductal dilatation. No focal liver lesions are seen.   Pancreas: Unremarkable. No pancreatic ductal dilatation or surrounding inflammatory changes.   Spleen: Normal in size without focal abnormality.   Adrenals/Urinary Tract: Adrenal glands are unremarkable. Kidneys are normal, without renal calculi, focal lesion, or hydronephrosis. Bladder is unremarkable.   Stomach/Bowel: Stomach is within normal limits. Appendix appears normal. No evidence of bowel wall thickening, distention, or inflammatory changes.   Lymphatic: No enlarged lymph nodes are seen.   Reproductive: Uterus is prominent in size and lobulated  likely related to fibroid change. Ovaries are unremarkable.   Other: There is a small fat containing umbilical hernia. No ascites.   Musculoskeletal: No fracture is seen.   Review of the MIP images confirms the above findings.   IMPRESSION: 1. No evidence for aortic dissection or aneurysm. 2. Gallbladder wall thickening with possible gallstones. No surrounding inflammation. Recommend right upper quadrant ultrasound for further evaluation. 3. Minimal patchy ground-glass opacities in the left upper lobe, likely infectious/inflammatory. 4. Right solid pulmonary nodule within the upper lobe measuring 3 mm. Per Fleischner Society Guidelines, if patient is low risk for malignancy, no routine follow-up imaging is recommended. If patient is high risk for malignancy, a non-contrast Chest CT at 12 months is optional. If performed and the nodule is stable at 12 months, no further follow-up is recommended. These guidelines do not apply to immunocompromised patients and patients with cancer. Follow up in patients with significant comorbidities as clinically warranted. For lung  cancer screening, adhere to Lung-RADS guidelines. Reference: Radiology. 2017; 284(1):228-43. 5. Fibroid uterus.     Electronically Signed   By: Greig Pique M.D.   On: 07/01/2023 17:01  ____________________________________________ Other:   1. Surgical [P], colon, transverse, polyp (1) :       - TUBULAR ADENOMA.       - NO HIGH GRADE DYSPLASIA OR MALIGNANCY.        2. Surgical [P], duodenal :       - BENIGN SMALL BOWEL MUCOSA WITH NO SIGNIFICANT PATHOLOGIC CHANGES        3. Surgical [P], gastric :       - MILD REACTIVE GASTROPATHY.       - NEGATIVE FOR H. PYLORI ON H&E STAIN       - NO INTESTINAL METAPLASIA, DYSPLASIA, OR MALIGNANCY.  _____________________________________________   Encounter Diagnoses  Name Primary?   Other iron deficiency anemia Yes   Epigastric pain    Adenomatous colon  polyp Assessment & Plan  Chronic GI symptoms significantly improved, likely coinciding with her much better diabetic control in the last 6 months or so.  If she should have recurrence of chronic upper abdominal pain, we will consider gallstones as a possibility since there may have been some on the CT angiogram noted above.  That would require further investigation with an ultrasound.  Iron deficiency anemia of unclear cause, no explanation on EGD or colonoscopy.  Needs follow-up so we can determine how long she needs iron and whether or not video capsule study would be needed. If anemia and iron levels have not normalized on today's labs, or if they normalize but then recur after stopping iron, plan video capsule study.  We had an initial discussion about this possibility so they would understand the rationale.  Recall set for future colonoscopy due to the adenomatous polyp found on recent exam.  She was congratulated on her better diabetic control and encouraged to keep up the good work.  Cindy [name] Brand III

## 2024-09-10 NOTE — Patient Instructions (Signed)
 Your provider has requested that you go to the basement level for lab work before leaving today. Press B on the elevator. The lab is located at the first door on the left as you exit the elevator.   _______________________________________________________  If your blood pressure at your visit was 140/90 or greater, please contact your primary care physician to follow up on this.  _______________________________________________________  If you are age 60 or older, your body mass index should be between 23-30. Your Body mass index is 39.04 kg/m. If this is out of the aforementioned range listed, please consider follow up with your Primary Care Provider.  If you are age 42 or younger, your body mass index should be between 19-25. Your Body mass index is 39.04 kg/m. If this is out of the aformentioned range listed, please consider follow up with your Primary Care Provider.   ________________________________________________________  The [facility] providers would like to encourage you to use MYCHART to communicate with providers for non-urgent requests or questions.  Due to long hold times on the telephone, sending your provider a message by [facility] may be a faster and more efficient way to get a response.  Please allow 48 business hours for a response.  Please remember that this is for non-urgent requests.  _______________________________________________________  [facility] is using a team-based approach to care.  Your team is made up of your doctor and two to three APPS. Our APPS (Nurse Practitioners and Physician Assistants) work with your physician to ensure care continuity for you. They are fully qualified to address your health concerns and develop a treatment plan. They communicate directly with your gastroenterologist to care for you. Seeing the Advanced Practice Practitioners on your physician's team can help you by facilitating care more promptly, often allowing for earlier  appointments, access to diagnostic testing, procedures, and other specialty referrals.    Thank you for trusting me with your gastrointestinal care!    Dr. Victory Legrand DOUGLAS [facility]

## 2024-09-11 ENCOUNTER — Ambulatory Visit: Payer: Self-pay | Admitting: Gastroenterology

## 2024-09-11 ENCOUNTER — Encounter: Payer: Self-pay | Admitting: Gastroenterology

## 2024-09-11 DIAGNOSIS — D500 Iron deficiency anemia secondary to blood loss (chronic): Secondary | ICD-10-CM | POA: Insufficient documentation

## 2024-09-11 NOTE — Addendum Note (Signed)
 Addended by: LEGRAND VICTORY CROME on: 09/11/2024 01:22 PM   Modules accepted: Orders

## 2024-09-12 ENCOUNTER — Other Ambulatory Visit: Payer: Self-pay | Admitting: Internal Medicine

## 2024-09-13 ENCOUNTER — Other Ambulatory Visit (HOSPITAL_COMMUNITY): Payer: Self-pay

## 2024-09-17 ENCOUNTER — Telehealth: Payer: Self-pay | Admitting: Pharmacy Technician

## 2024-09-17 NOTE — Telephone Encounter (Signed)
 Auth Submission: APPROVED Site of care: Site of care: [facility] Payer: [employer] Medication & CPT/J Code(s) submitted: Venofer (Iron Sucrose) J1756 Diagnosis Code: D50.9 Route of submission (phone, fax, portal):  Phone # Fax # Auth type: Buy/Bill PB Units/visits requested:X5 DOSES Reference number:  Approval from: 09/13/24 to 11/28/24

## 2024-09-19 NOTE — Progress Notes (Signed)
 The pt has been contacted and agrees to the IV iron Capsule has been set up for 10/02/24 at 830 am  Instructions discussed and sent to the pt MyChart- she does view messages  She will increase iron to twice daily

## 2024-09-26 ENCOUNTER — Other Ambulatory Visit: Payer: Self-pay | Admitting: Internal Medicine

## 2024-09-27 ENCOUNTER — Other Ambulatory Visit (HOSPITAL_COMMUNITY): Payer: Self-pay

## 2024-09-27 ENCOUNTER — Other Ambulatory Visit: Payer: Self-pay | Admitting: Student

## 2024-09-27 MED ORDER — METOPROLOL SUCCINATE ER 25 MG PO TB24
25.0000 mg | ORAL_TABLET | Freq: Every day | ORAL | 0 refills | Status: DC
  Filled 2024-09-27: qty 90, 90d supply, fill #0

## 2024-10-02 ENCOUNTER — Encounter

## 2024-10-02 ENCOUNTER — Telehealth: Payer: Self-pay

## 2024-10-02 ENCOUNTER — Other Ambulatory Visit: Payer: Self-pay

## 2024-10-02 DIAGNOSIS — D508 Other iron deficiency anemias: Secondary | ICD-10-CM

## 2024-10-02 NOTE — Telephone Encounter (Signed)
 Pt her for capsule. Amb ref was not placed. Attempted to get approval with April Mcpeak but she was unable to do so. Spoke with pt and she is aware that we will need to reschedule procedure. Reschedule appt for 10/22/24 at 8:30am. Pt aware of new appt, sheet upstairs and amb ref in for VCE.

## 2024-10-08 ENCOUNTER — Other Ambulatory Visit (HOSPITAL_COMMUNITY): Payer: Self-pay

## 2024-10-08 MED ORDER — DORZOLAMIDE HCL-TIMOLOL MAL 2-0.5 % OP SOLN
1.0000 [drp] | Freq: Two times a day (BID) | OPHTHALMIC | 11 refills | Status: AC
  Filled 2024-10-08: qty 10, 50d supply, fill #0
  Filled 2025-01-02: qty 10, 50d supply, fill #1

## 2024-10-17 ENCOUNTER — Encounter: Payer: Self-pay | Admitting: *Deleted

## 2024-10-19 ENCOUNTER — Other Ambulatory Visit (HOSPITAL_COMMUNITY): Payer: Self-pay

## 2024-10-19 ENCOUNTER — Ambulatory Visit: Attending: Internal Medicine | Admitting: Internal Medicine

## 2024-10-19 ENCOUNTER — Encounter: Payer: Self-pay | Admitting: Internal Medicine

## 2024-10-19 VITALS — BP 135/78 | HR 78 | Resp 16 | Ht 63.0 in | Wt 224.6 lb

## 2024-10-19 DIAGNOSIS — I2510 Atherosclerotic heart disease of native coronary artery without angina pectoris: Secondary | ICD-10-CM | POA: Diagnosis not present

## 2024-10-19 DIAGNOSIS — E118 Type 2 diabetes mellitus with unspecified complications: Secondary | ICD-10-CM

## 2024-10-19 DIAGNOSIS — E7800 Pure hypercholesterolemia, unspecified: Secondary | ICD-10-CM | POA: Diagnosis not present

## 2024-10-19 DIAGNOSIS — Z79899 Other long term (current) drug therapy: Secondary | ICD-10-CM

## 2024-10-19 DIAGNOSIS — E785 Hyperlipidemia, unspecified: Secondary | ICD-10-CM

## 2024-10-19 DIAGNOSIS — Z794 Long term (current) use of insulin: Secondary | ICD-10-CM

## 2024-10-19 DIAGNOSIS — Z9861 Coronary angioplasty status: Secondary | ICD-10-CM

## 2024-10-19 MED ORDER — NITROGLYCERIN 0.4 MG SL SUBL
0.4000 mg | SUBLINGUAL_TABLET | SUBLINGUAL | 4 refills | Status: AC | PRN
  Filled 2024-10-19: qty 25, 15d supply, fill #0

## 2024-10-19 MED ORDER — ISOSORBIDE MONONITRATE ER 30 MG PO TB24
30.0000 mg | ORAL_TABLET | Freq: Every day | ORAL | 3 refills | Status: AC
  Filled 2024-10-19 – 2024-10-31 (×2): qty 90, 90d supply, fill #0

## 2024-10-19 MED ORDER — LISINOPRIL 40 MG PO TABS
40.0000 mg | ORAL_TABLET | Freq: Every day | ORAL | 3 refills | Status: AC
  Filled 2024-10-19: qty 90, 90d supply, fill #0

## 2024-10-19 MED ORDER — METOPROLOL SUCCINATE ER 25 MG PO TB24
25.0000 mg | ORAL_TABLET | Freq: Every day | ORAL | 3 refills | Status: AC
  Filled 2024-10-19 – 2025-01-02 (×2): qty 90, 90d supply, fill #0

## 2024-10-19 MED ORDER — ATORVASTATIN CALCIUM 80 MG PO TABS
80.0000 mg | ORAL_TABLET | Freq: Every day | ORAL | 3 refills | Status: AC
  Filled 2024-10-19: qty 90, 90d supply, fill #0

## 2024-10-19 MED ORDER — EZETIMIBE 10 MG PO TABS
10.0000 mg | ORAL_TABLET | Freq: Every day | ORAL | 3 refills | Status: AC
  Filled 2024-10-19: qty 90, 90d supply, fill #0

## 2024-10-19 MED ORDER — CLOPIDOGREL BISULFATE 75 MG PO TABS
75.0000 mg | ORAL_TABLET | Freq: Every day | ORAL | 3 refills | Status: AC
  Filled 2024-10-19: qty 90, 90d supply, fill #0

## 2024-10-19 NOTE — Progress Notes (Signed)
 Cardiology Office Note:  .   Date:  10/19/2024  ID:  Cindy [name], DOB 1966-03-13, MRN [id number] PCP: Nooruddin, Saad, MD  [facility] Providers Cardiologist:  Soyla DELENA Merck, MD    History of Present Illness: Cindy [name]   Cindy [name] is a 58 y.o. female.  Discussed the use of AI scribe software for clinical note transcription with the patient, who gave verbal consent to proceed.  History of Present Illness Cindy [name] is a 58 year old female with coronary artery disease who presents for a cardiology follow-up. She is accompanied by her twin sister.  She has coronary artery disease with two stents placed in 2021. She takes Plavix  75 mg daily, atorvastatin  80 mg daily, Zetia  10 mg daily, and isosorbide  mononitrate at half a tablet daily. Isosorbide  helps, but she has exertional chest discomfort about once a week that resolves with rest. She has not used nitroglycerin  recently and needs her prescription updated.  Her blood pressure is treated with lisinopril  40 mg daily and metoprolol  25 mg daily, with good control and no side effects.  She started Ozempic  about three months ago. Her hemoglobin A1c improved from 13 in May to 7.9 in August, and she has had weight loss.  She reports occasional leg swelling.    ROS: negative except per HPI above.  Studies Reviewed: .        Results LABS Hemoglobin A1c: 7.9 (06/2024)  DIAGNOSTIC Colonoscopy: Normal; one small polyp removed Risk Assessment/Calculations:       Physical Exam:   VS:  BP 135/78 (BP Location: Left Arm, Patient Position: Sitting, Cuff Size: Large)   Pulse 78   Resp 16   Ht 5' 3 (1.6 m)   Wt 224 lb 9.6 oz (101.9 kg)   LMP 08/11/2016   SpO2 100%   BMI 39.79 kg/m    Wt Readings from Last 3 Encounters:  10/19/24 224 lb 9.6 oz (101.9 kg)  09/10/24 220 lb 6 oz (100 kg)  08/16/24 223 lb 3.2 oz (101.2 kg)     Physical Exam VITALS: BP- 135/81 GENERAL: Alert, cooperative, well developed,  no acute distress. HEENT: Normocephalic, normal oropharynx, moist mucous membranes. CHEST: Clear to auscultation bilaterally, no wheezes, rhonchi, or crackles. CARDIOVASCULAR: Normal heart rate and rhythm, S1 and S2 normal without murmurs. ABDOMEN: Soft, non-tender, non-distended, without organomegaly, normal bowel sounds. EXTREMITIES: No cyanosis or edema. NEUROLOGICAL: Cranial nerves grossly intact, moves all extremities without gross motor or sensory deficit.   ASSESSMENT AND PLAN: .    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 Stable angina with weekly chest discomfort, resolving with rest. Current Isosorbide  Imdur  dosage may be insufficient. Blood pressure well-controlled, allowing for dosage increase. - Increased Isosorbide  Imdur  to a full tablet daily, 30 mg daily. - Refilled Isosorbide  Imdur  prescription with updated dosage. - Refilled nitroglycerin  prescription 0.4 mg SL. - Monitor for headaches with increased Isosorbide  Imdur  dosage; if headaches occur, reduce dose or consume moderate caffeine.  Type 2 diabetes mellitus Type 2 diabetes with significant improvement in glycemic control. Hemoglobin A1c decreased from 13% to 7.9%. Ozempic  contributing to improved blood sugar levels and weight loss. - Continue Ozempic  as prescribed.  Hypertension Well-controlled with current medication regimen. Blood pressure 135/81 mmHg. - Continue lisinopril  40 mg daily, imdur  at 30 mg daily, metoprolol  succ 25 mg daily.   Hyperlipidemia Managed with atorvastatin  and ezetimibe . -  Continue atorvastatin  80 mg daily and ezetimibe  10 mg daily as prescribed.        Soyla Merck, MD, FACC

## 2024-10-19 NOTE — Progress Notes (Deleted)
  Cardiology Office Note:  .   Date:  10/19/2024  ID:  Suzann Nichole Moats, DOB [birth year], MRN [id number] PCP: Nooruddin, Saad, MD  [facility] Providers Cardiologist:  Soyla DELENA Merck, MD    History of Present Illness: Cindy [name]   [name] is a 58 y.o. female.  Discussed the use of AI scribe software for clinical note transcription with the patient, who gave verbal consent to proceed.  History of Present Illness     ROS: negative except per HPI above.  Studies Reviewed: .        Results  Risk Assessment/Calculations:   {Does this patient have ATRIAL FIBRILLATION?:[phone]}   Physical Exam:   VS:  BP 135/78 (BP Location: Left Arm, Patient Position: Sitting, Cuff Size: Large)   Pulse 78   Resp 16   Ht 5' 3 (1.6 m)   Wt 224 lb 9.6 oz (101.9 kg)   LMP 08/11/2016   SpO2 100%   BMI 39.79 kg/m    Wt Readings from Last 3 Encounters:  10/19/24 224 lb 9.6 oz (101.9 kg)  09/10/24 220 lb 6 oz (100 kg)  08/16/24 223 lb 3.2 oz (101.2 kg)     Physical Exam    ASSESSMENT AND PLAN: .    Assessment and Plan Assessment & Plan       Soyla Merck, MD, FACC

## 2024-10-19 NOTE — Patient Instructions (Addendum)
 Medication Instructions:  Increase: Isosorbide  mononitrate (Imdur ) to 30 mg once daily  Lab Work: None  Follow-Up: At [facility], you and your health needs are our priority.  As part of our continuing mission to provide you with exceptional heart care, our providers are all part of one team.  This team includes your primary Cardiologist (physician) and Advanced Practice Providers or APPs (Physician Assistants and Nurse Practitioners) who all work together to provide you with the care you need, when you need it.  Your next appointment:   6 month(s) (Please call in Feb for a May 2026 appointment)  Provider:   Gayatri A Acharya, MD   Other Instructions Please call us  or send a MyChart message with any Cardiology related questions/concerns.  [phone].  Thank you!

## 2024-10-22 ENCOUNTER — Telehealth: Payer: Self-pay | Admitting: Gastroenterology

## 2024-10-22 ENCOUNTER — Encounter

## 2024-10-22 ENCOUNTER — Encounter: Payer: Self-pay | Admitting: Gastroenterology

## 2024-10-22 NOTE — Telephone Encounter (Signed)
 Inbound call from patient stating that she has a capsule endoscopy today and needed to know weather her procedure would be covered with her insurance. Patient stated that this is not the first time she has reschedule her appointment due to her not being notified if we have checked with her insurance. Patient is wanting to wait weather it is covered through her insurance in order to reschedule. Please advise.

## 2024-10-26 ENCOUNTER — Other Ambulatory Visit (HOSPITAL_COMMUNITY): Payer: Self-pay

## 2024-10-31 ENCOUNTER — Other Ambulatory Visit (HOSPITAL_COMMUNITY): Payer: Self-pay

## 2024-11-09 ENCOUNTER — Encounter

## 2024-11-13 LAB — OPHTHALMOLOGY REPORT-SCANNED

## 2024-11-26 ENCOUNTER — Encounter: Payer: Self-pay | Admitting: Gastroenterology

## 2024-12-03 ENCOUNTER — Other Ambulatory Visit (HOSPITAL_COMMUNITY): Payer: Self-pay

## 2024-12-03 ENCOUNTER — Encounter (INDEPENDENT_AMBULATORY_CARE_PROVIDER_SITE_OTHER): Payer: Self-pay | Admitting: Ophthalmology

## 2024-12-03 DIAGNOSIS — H4313 Vitreous hemorrhage, bilateral: Secondary | ICD-10-CM

## 2024-12-03 DIAGNOSIS — I10 Essential (primary) hypertension: Secondary | ICD-10-CM | POA: Diagnosis not present

## 2024-12-03 DIAGNOSIS — H35033 Hypertensive retinopathy, bilateral: Secondary | ICD-10-CM | POA: Diagnosis not present

## 2024-12-03 DIAGNOSIS — H43813 Vitreous degeneration, bilateral: Secondary | ICD-10-CM

## 2024-12-03 DIAGNOSIS — E113513 Type 2 diabetes mellitus with proliferative diabetic retinopathy with macular edema, bilateral: Secondary | ICD-10-CM

## 2024-12-03 DIAGNOSIS — Z7985 Long-term (current) use of injectable non-insulin antidiabetic drugs: Secondary | ICD-10-CM | POA: Diagnosis not present

## 2024-12-03 DIAGNOSIS — Z794 Long term (current) use of insulin: Secondary | ICD-10-CM

## 2024-12-03 DIAGNOSIS — Z7984 Long term (current) use of oral hypoglycemic drugs: Secondary | ICD-10-CM

## 2024-12-05 ENCOUNTER — Encounter (INDEPENDENT_AMBULATORY_CARE_PROVIDER_SITE_OTHER): Admitting: Ophthalmology

## 2024-12-05 ENCOUNTER — Encounter

## 2024-12-05 ENCOUNTER — Other Ambulatory Visit (HOSPITAL_COMMUNITY): Payer: Self-pay

## 2024-12-05 DIAGNOSIS — Z794 Long term (current) use of insulin: Secondary | ICD-10-CM | POA: Diagnosis not present

## 2024-12-05 DIAGNOSIS — E113513 Type 2 diabetes mellitus with proliferative diabetic retinopathy with macular edema, bilateral: Secondary | ICD-10-CM | POA: Diagnosis not present

## 2024-12-05 DIAGNOSIS — Z7984 Long term (current) use of oral hypoglycemic drugs: Secondary | ICD-10-CM | POA: Diagnosis not present

## 2024-12-05 DIAGNOSIS — Z7985 Long-term (current) use of injectable non-insulin antidiabetic drugs: Secondary | ICD-10-CM | POA: Diagnosis not present

## 2024-12-05 MED ORDER — POLYMYXIN B-TRIMETHOPRIM 10000-0.1 UNIT/ML-% OP SOLN
1.0000 [drp] | Freq: Four times a day (QID) | OPHTHALMIC | 12 refills | Status: AC
  Filled 2024-12-05: qty 10, 50d supply, fill #0

## 2024-12-06 ENCOUNTER — Ambulatory Visit: Payer: Self-pay | Admitting: Student

## 2024-12-06 ENCOUNTER — Ambulatory Visit: Admitting: Endocrinology

## 2024-12-10 ENCOUNTER — Encounter (INDEPENDENT_AMBULATORY_CARE_PROVIDER_SITE_OTHER): Admitting: Ophthalmology

## 2025-01-02 ENCOUNTER — Other Ambulatory Visit: Payer: Self-pay

## 2025-01-02 ENCOUNTER — Other Ambulatory Visit: Payer: Self-pay | Admitting: Student

## 2025-01-02 ENCOUNTER — Other Ambulatory Visit (HOSPITAL_COMMUNITY): Payer: Self-pay

## 2025-01-02 ENCOUNTER — Encounter (INDEPENDENT_AMBULATORY_CARE_PROVIDER_SITE_OTHER): Admitting: Ophthalmology

## 2025-01-02 DIAGNOSIS — Z7985 Long-term (current) use of injectable non-insulin antidiabetic drugs: Secondary | ICD-10-CM | POA: Diagnosis not present

## 2025-01-02 DIAGNOSIS — E113513 Type 2 diabetes mellitus with proliferative diabetic retinopathy with macular edema, bilateral: Secondary | ICD-10-CM | POA: Diagnosis not present

## 2025-01-02 DIAGNOSIS — Z794 Long term (current) use of insulin: Secondary | ICD-10-CM | POA: Diagnosis not present

## 2025-01-02 DIAGNOSIS — I10 Essential (primary) hypertension: Secondary | ICD-10-CM

## 2025-01-02 DIAGNOSIS — E118 Type 2 diabetes mellitus with unspecified complications: Secondary | ICD-10-CM

## 2025-01-02 DIAGNOSIS — H4313 Vitreous hemorrhage, bilateral: Secondary | ICD-10-CM

## 2025-01-02 DIAGNOSIS — H35033 Hypertensive retinopathy, bilateral: Secondary | ICD-10-CM | POA: Diagnosis not present

## 2025-01-02 DIAGNOSIS — H43813 Vitreous degeneration, bilateral: Secondary | ICD-10-CM

## 2025-01-02 MED ORDER — BASAGLAR KWIKPEN 100 UNIT/ML ~~LOC~~ SOPN
26.0000 [IU] | PEN_INJECTOR | Freq: Two times a day (BID) | SUBCUTANEOUS | 4 refills | Status: AC
  Filled 2025-01-02: qty 15, 29d supply, fill #0

## 2025-01-02 NOTE — Telephone Encounter (Signed)
 Medication sent to pharmacy

## 2025-01-30 ENCOUNTER — Encounter (INDEPENDENT_AMBULATORY_CARE_PROVIDER_SITE_OTHER): Admitting: Ophthalmology
